# Patient Record
Sex: Female | Born: 1975 | Race: Black or African American | Hispanic: No | Smoking: Former smoker
Health system: Southern US, Community
[De-identification: ages and names within clinical notes are randomized; demographics above are authoritative.]

## PROBLEM LIST (undated history)

## (undated) DIAGNOSIS — D649 Anemia, unspecified: Secondary | ICD-10-CM

## (undated) DIAGNOSIS — M7989 Other specified soft tissue disorders: Secondary | ICD-10-CM

## (undated) DIAGNOSIS — I2699 Other pulmonary embolism without acute cor pulmonale: Secondary | ICD-10-CM

## (undated) DIAGNOSIS — M2550 Pain in unspecified joint: Secondary | ICD-10-CM

## (undated) DIAGNOSIS — I272 Pulmonary hypertension, unspecified: Secondary | ICD-10-CM

## (undated) DIAGNOSIS — M549 Dorsalgia, unspecified: Secondary | ICD-10-CM

## (undated) DIAGNOSIS — I10 Essential (primary) hypertension: Secondary | ICD-10-CM

## (undated) DIAGNOSIS — G4733 Obstructive sleep apnea (adult) (pediatric): Secondary | ICD-10-CM

## (undated) DIAGNOSIS — E559 Vitamin D deficiency, unspecified: Secondary | ICD-10-CM

## (undated) DIAGNOSIS — G4734 Idiopathic sleep related nonobstructive alveolar hypoventilation: Secondary | ICD-10-CM

## (undated) DIAGNOSIS — E119 Type 2 diabetes mellitus without complications: Secondary | ICD-10-CM

## (undated) DIAGNOSIS — I509 Heart failure, unspecified: Secondary | ICD-10-CM

## (undated) HISTORY — DX: Idiopathic sleep related nonobstructive alveolar hypoventilation: G47.34

## (undated) HISTORY — DX: Pulmonary hypertension, unspecified: I27.20

## (undated) HISTORY — DX: Type 2 diabetes mellitus without complications: E11.9

## (undated) HISTORY — DX: Vitamin D deficiency, unspecified: E55.9

## (undated) HISTORY — DX: Other specified soft tissue disorders: M79.89

## (undated) HISTORY — DX: Obstructive sleep apnea (adult) (pediatric): G47.33

## (undated) HISTORY — DX: Dorsalgia, unspecified: M54.9

## (undated) HISTORY — DX: Pain in unspecified joint: M25.50

## (undated) HISTORY — DX: Heart failure, unspecified: I50.9

## (undated) HISTORY — DX: Anemia, unspecified: D64.9

## (undated) HISTORY — DX: Other pulmonary embolism without acute cor pulmonale: I26.99

---

## 2002-07-15 ENCOUNTER — Ambulatory Visit (HOSPITAL_COMMUNITY): Admission: RE | Admit: 2002-07-15 | Discharge: 2002-07-15 | Payer: Self-pay | Admitting: *Deleted

## 2002-09-08 ENCOUNTER — Ambulatory Visit (HOSPITAL_COMMUNITY): Admission: RE | Admit: 2002-09-08 | Discharge: 2002-09-08 | Payer: Self-pay | Admitting: *Deleted

## 2002-11-27 ENCOUNTER — Inpatient Hospital Stay (HOSPITAL_COMMUNITY): Admission: AD | Admit: 2002-11-27 | Discharge: 2002-11-27 | Payer: Self-pay | Admitting: *Deleted

## 2002-12-17 ENCOUNTER — Encounter: Admission: RE | Admit: 2002-12-17 | Discharge: 2002-12-17 | Payer: Self-pay | Admitting: *Deleted

## 2002-12-17 ENCOUNTER — Inpatient Hospital Stay (HOSPITAL_COMMUNITY): Admission: AD | Admit: 2002-12-17 | Discharge: 2002-12-17 | Payer: Self-pay | Admitting: Obstetrics & Gynecology

## 2002-12-23 ENCOUNTER — Inpatient Hospital Stay (HOSPITAL_COMMUNITY): Admission: AD | Admit: 2002-12-23 | Discharge: 2002-12-23 | Payer: Self-pay | Admitting: Obstetrics and Gynecology

## 2002-12-23 ENCOUNTER — Encounter: Payer: Self-pay | Admitting: Obstetrics and Gynecology

## 2002-12-28 ENCOUNTER — Encounter (INDEPENDENT_AMBULATORY_CARE_PROVIDER_SITE_OTHER): Payer: Self-pay

## 2002-12-28 ENCOUNTER — Encounter: Payer: Self-pay | Admitting: Obstetrics & Gynecology

## 2002-12-28 ENCOUNTER — Inpatient Hospital Stay (HOSPITAL_COMMUNITY): Admission: AD | Admit: 2002-12-28 | Discharge: 2002-12-29 | Payer: Self-pay | Admitting: Obstetrics & Gynecology

## 2003-02-15 ENCOUNTER — Inpatient Hospital Stay (HOSPITAL_COMMUNITY): Admission: AD | Admit: 2003-02-15 | Discharge: 2003-02-16 | Payer: Self-pay | Admitting: Obstetrics & Gynecology

## 2003-02-22 ENCOUNTER — Encounter: Admission: RE | Admit: 2003-02-22 | Discharge: 2003-02-22 | Payer: Self-pay | Admitting: *Deleted

## 2003-06-28 ENCOUNTER — Inpatient Hospital Stay (HOSPITAL_COMMUNITY): Admission: AD | Admit: 2003-06-28 | Discharge: 2003-06-28 | Payer: Self-pay | Admitting: Family Medicine

## 2003-07-27 ENCOUNTER — Inpatient Hospital Stay (HOSPITAL_COMMUNITY): Admission: AD | Admit: 2003-07-27 | Discharge: 2003-07-27 | Payer: Self-pay | Admitting: Family Medicine

## 2003-08-26 ENCOUNTER — Encounter: Admission: RE | Admit: 2003-08-26 | Discharge: 2003-08-26 | Payer: Self-pay | Admitting: Family Medicine

## 2003-09-02 ENCOUNTER — Ambulatory Visit (HOSPITAL_COMMUNITY): Admission: RE | Admit: 2003-09-02 | Discharge: 2003-09-02 | Payer: Self-pay | Admitting: Obstetrics and Gynecology

## 2003-09-09 ENCOUNTER — Encounter: Admission: RE | Admit: 2003-09-09 | Discharge: 2003-09-09 | Payer: Self-pay | Admitting: Family Medicine

## 2003-09-30 ENCOUNTER — Encounter: Admission: RE | Admit: 2003-09-30 | Discharge: 2003-09-30 | Payer: Self-pay | Admitting: *Deleted

## 2003-10-14 ENCOUNTER — Encounter: Admission: RE | Admit: 2003-10-14 | Discharge: 2003-10-14 | Payer: Self-pay | Admitting: Family Medicine

## 2003-10-28 ENCOUNTER — Encounter: Admission: RE | Admit: 2003-10-28 | Discharge: 2003-10-28 | Payer: Self-pay | Admitting: Family Medicine

## 2003-10-28 ENCOUNTER — Ambulatory Visit (HOSPITAL_COMMUNITY): Admission: RE | Admit: 2003-10-28 | Discharge: 2003-10-28 | Payer: Self-pay | Admitting: *Deleted

## 2003-11-04 ENCOUNTER — Encounter: Admission: RE | Admit: 2003-11-04 | Discharge: 2003-11-04 | Payer: Self-pay | Admitting: Family Medicine

## 2003-11-18 ENCOUNTER — Encounter: Admission: RE | Admit: 2003-11-18 | Discharge: 2003-11-18 | Payer: Self-pay | Admitting: Family Medicine

## 2003-11-25 ENCOUNTER — Ambulatory Visit (HOSPITAL_COMMUNITY): Admission: RE | Admit: 2003-11-25 | Discharge: 2003-11-25 | Payer: Self-pay | Admitting: Family Medicine

## 2003-12-01 ENCOUNTER — Inpatient Hospital Stay (HOSPITAL_COMMUNITY): Admission: AD | Admit: 2003-12-01 | Discharge: 2003-12-01 | Payer: Self-pay | Admitting: Obstetrics and Gynecology

## 2003-12-02 ENCOUNTER — Ambulatory Visit: Payer: Self-pay | Admitting: Family Medicine

## 2003-12-09 ENCOUNTER — Inpatient Hospital Stay (HOSPITAL_COMMUNITY): Admission: AD | Admit: 2003-12-09 | Discharge: 2003-12-09 | Payer: Self-pay | Admitting: *Deleted

## 2003-12-09 ENCOUNTER — Ambulatory Visit: Payer: Self-pay | Admitting: Family Medicine

## 2003-12-14 ENCOUNTER — Encounter: Admission: RE | Admit: 2003-12-14 | Discharge: 2003-12-14 | Payer: Self-pay | Admitting: *Deleted

## 2003-12-16 ENCOUNTER — Ambulatory Visit: Payer: Self-pay | Admitting: Family Medicine

## 2003-12-20 ENCOUNTER — Ambulatory Visit: Payer: Self-pay | Admitting: Obstetrics & Gynecology

## 2003-12-23 ENCOUNTER — Ambulatory Visit: Payer: Self-pay | Admitting: Family Medicine

## 2003-12-30 ENCOUNTER — Ambulatory Visit: Payer: Self-pay | Admitting: Family Medicine

## 2003-12-30 ENCOUNTER — Ambulatory Visit (HOSPITAL_COMMUNITY): Admission: RE | Admit: 2003-12-30 | Discharge: 2003-12-30 | Payer: Self-pay | Admitting: *Deleted

## 2004-01-06 ENCOUNTER — Ambulatory Visit: Payer: Self-pay | Admitting: Family Medicine

## 2004-01-11 ENCOUNTER — Ambulatory Visit: Payer: Self-pay | Admitting: Obstetrics & Gynecology

## 2004-01-12 ENCOUNTER — Inpatient Hospital Stay (HOSPITAL_COMMUNITY): Admission: AD | Admit: 2004-01-12 | Discharge: 2004-01-16 | Payer: Self-pay | Admitting: Obstetrics & Gynecology

## 2004-01-12 ENCOUNTER — Ambulatory Visit: Payer: Self-pay | Admitting: Family Medicine

## 2004-01-14 ENCOUNTER — Encounter (INDEPENDENT_AMBULATORY_CARE_PROVIDER_SITE_OTHER): Payer: Self-pay | Admitting: *Deleted

## 2006-01-12 ENCOUNTER — Emergency Department (HOSPITAL_COMMUNITY): Admission: EM | Admit: 2006-01-12 | Discharge: 2006-01-12 | Payer: Self-pay | Admitting: Emergency Medicine

## 2007-01-23 ENCOUNTER — Emergency Department (HOSPITAL_COMMUNITY): Admission: EM | Admit: 2007-01-23 | Discharge: 2007-01-24 | Payer: Self-pay | Admitting: Emergency Medicine

## 2007-09-17 ENCOUNTER — Inpatient Hospital Stay (HOSPITAL_COMMUNITY): Admission: AD | Admit: 2007-09-17 | Discharge: 2007-09-17 | Payer: Self-pay | Admitting: Obstetrics & Gynecology

## 2007-10-28 ENCOUNTER — Inpatient Hospital Stay (HOSPITAL_COMMUNITY): Admission: AD | Admit: 2007-10-28 | Discharge: 2007-10-28 | Payer: Self-pay | Admitting: Obstetrics & Gynecology

## 2007-11-19 ENCOUNTER — Encounter: Payer: Self-pay | Admitting: Obstetrics and Gynecology

## 2007-11-19 ENCOUNTER — Ambulatory Visit: Payer: Self-pay | Admitting: Obstetrics & Gynecology

## 2007-12-03 ENCOUNTER — Ambulatory Visit (HOSPITAL_COMMUNITY): Admission: RE | Admit: 2007-12-03 | Discharge: 2007-12-03 | Payer: Self-pay | Admitting: Family Medicine

## 2007-12-11 ENCOUNTER — Ambulatory Visit: Payer: Self-pay | Admitting: Obstetrics & Gynecology

## 2007-12-25 ENCOUNTER — Ambulatory Visit: Payer: Self-pay | Admitting: Obstetrics & Gynecology

## 2007-12-25 ENCOUNTER — Ambulatory Visit (HOSPITAL_COMMUNITY): Admission: RE | Admit: 2007-12-25 | Discharge: 2007-12-25 | Payer: Self-pay | Admitting: Family Medicine

## 2008-01-01 ENCOUNTER — Ambulatory Visit: Payer: Self-pay | Admitting: Obstetrics & Gynecology

## 2008-01-08 ENCOUNTER — Ambulatory Visit (HOSPITAL_COMMUNITY): Admission: RE | Admit: 2008-01-08 | Discharge: 2008-01-08 | Payer: Self-pay | Admitting: Family Medicine

## 2008-01-15 ENCOUNTER — Ambulatory Visit: Payer: Self-pay | Admitting: Family Medicine

## 2008-01-22 ENCOUNTER — Ambulatory Visit (HOSPITAL_COMMUNITY): Admission: RE | Admit: 2008-01-22 | Discharge: 2008-01-22 | Payer: Self-pay | Admitting: Family Medicine

## 2008-01-29 ENCOUNTER — Ambulatory Visit: Payer: Self-pay | Admitting: Obstetrics & Gynecology

## 2008-01-29 ENCOUNTER — Encounter: Payer: Self-pay | Admitting: Family Medicine

## 2008-01-29 LAB — CONVERTED CEMR LAB: Chlamydia, DNA Probe: NEGATIVE

## 2008-02-05 ENCOUNTER — Ambulatory Visit (HOSPITAL_COMMUNITY): Admission: RE | Admit: 2008-02-05 | Discharge: 2008-02-05 | Payer: Self-pay | Admitting: Family Medicine

## 2008-02-12 ENCOUNTER — Encounter: Payer: Self-pay | Admitting: Family Medicine

## 2008-02-12 ENCOUNTER — Ambulatory Visit: Payer: Self-pay | Admitting: Obstetrics & Gynecology

## 2008-02-12 ENCOUNTER — Ambulatory Visit (HOSPITAL_COMMUNITY): Admission: RE | Admit: 2008-02-12 | Discharge: 2008-02-12 | Payer: Self-pay | Admitting: Obstetrics & Gynecology

## 2008-02-16 ENCOUNTER — Ambulatory Visit: Payer: Self-pay | Admitting: Obstetrics & Gynecology

## 2008-02-18 ENCOUNTER — Ambulatory Visit: Payer: Self-pay | Admitting: Obstetrics & Gynecology

## 2008-02-18 ENCOUNTER — Ambulatory Visit (HOSPITAL_COMMUNITY): Admission: RE | Admit: 2008-02-18 | Discharge: 2008-02-18 | Payer: Self-pay | Admitting: Family Medicine

## 2008-02-23 ENCOUNTER — Ambulatory Visit: Payer: Self-pay | Admitting: Obstetrics & Gynecology

## 2008-02-26 ENCOUNTER — Ambulatory Visit: Payer: Self-pay | Admitting: Obstetrics & Gynecology

## 2008-02-26 ENCOUNTER — Ambulatory Visit (HOSPITAL_COMMUNITY): Admission: RE | Admit: 2008-02-26 | Discharge: 2008-02-26 | Payer: Self-pay | Admitting: Family Medicine

## 2008-03-01 ENCOUNTER — Ambulatory Visit: Payer: Self-pay | Admitting: Obstetrics & Gynecology

## 2008-03-04 ENCOUNTER — Ambulatory Visit (HOSPITAL_COMMUNITY): Admission: RE | Admit: 2008-03-04 | Discharge: 2008-03-04 | Payer: Self-pay | Admitting: Family Medicine

## 2008-03-04 ENCOUNTER — Ambulatory Visit: Payer: Self-pay | Admitting: Obstetrics & Gynecology

## 2008-03-08 ENCOUNTER — Ambulatory Visit: Payer: Self-pay | Admitting: Obstetrics & Gynecology

## 2008-03-11 ENCOUNTER — Ambulatory Visit (HOSPITAL_COMMUNITY): Admission: RE | Admit: 2008-03-11 | Discharge: 2008-03-11 | Payer: Self-pay | Admitting: Family Medicine

## 2008-03-11 ENCOUNTER — Encounter: Payer: Self-pay | Admitting: Family Medicine

## 2008-03-11 ENCOUNTER — Ambulatory Visit: Payer: Self-pay | Admitting: Obstetrics & Gynecology

## 2008-03-12 ENCOUNTER — Encounter: Payer: Self-pay | Admitting: Family Medicine

## 2008-03-15 ENCOUNTER — Ambulatory Visit: Payer: Self-pay | Admitting: Family Medicine

## 2008-03-17 ENCOUNTER — Ambulatory Visit (HOSPITAL_COMMUNITY): Admission: RE | Admit: 2008-03-17 | Discharge: 2008-03-17 | Payer: Self-pay | Admitting: Family Medicine

## 2008-03-22 ENCOUNTER — Ambulatory Visit (HOSPITAL_COMMUNITY): Admission: RE | Admit: 2008-03-22 | Discharge: 2008-03-22 | Payer: Self-pay | Admitting: Family Medicine

## 2008-03-25 ENCOUNTER — Ambulatory Visit: Payer: Self-pay | Admitting: Family Medicine

## 2008-03-25 ENCOUNTER — Ambulatory Visit (HOSPITAL_COMMUNITY): Admission: RE | Admit: 2008-03-25 | Discharge: 2008-03-25 | Payer: Self-pay | Admitting: Family Medicine

## 2008-03-29 ENCOUNTER — Ambulatory Visit: Payer: Self-pay | Admitting: Obstetrics & Gynecology

## 2008-04-02 ENCOUNTER — Inpatient Hospital Stay (HOSPITAL_COMMUNITY): Admission: RE | Admit: 2008-04-02 | Discharge: 2008-04-06 | Payer: Self-pay | Admitting: Family Medicine

## 2008-04-02 ENCOUNTER — Ambulatory Visit: Payer: Self-pay | Admitting: Obstetrics & Gynecology

## 2008-04-03 ENCOUNTER — Encounter: Payer: Self-pay | Admitting: Obstetrics & Gynecology

## 2009-02-27 ENCOUNTER — Emergency Department (HOSPITAL_COMMUNITY): Admission: EM | Admit: 2009-02-27 | Discharge: 2009-02-27 | Payer: Self-pay | Admitting: Advanced Practice Midwife

## 2009-09-13 ENCOUNTER — Ambulatory Visit: Payer: Self-pay | Admitting: Vascular Surgery

## 2009-09-13 ENCOUNTER — Encounter (INDEPENDENT_AMBULATORY_CARE_PROVIDER_SITE_OTHER): Payer: Self-pay | Admitting: Emergency Medicine

## 2009-09-13 ENCOUNTER — Emergency Department (HOSPITAL_COMMUNITY): Admission: EM | Admit: 2009-09-13 | Discharge: 2009-09-13 | Payer: Self-pay | Admitting: Emergency Medicine

## 2010-04-16 ENCOUNTER — Encounter: Payer: Self-pay | Admitting: *Deleted

## 2010-07-10 LAB — CBC
HCT: 29.5 % — ABNORMAL LOW (ref 36.0–46.0)
HCT: 33 % — ABNORMAL LOW (ref 36.0–46.0)
MCHC: 32.4 g/dL (ref 30.0–36.0)
MCHC: 32.5 g/dL (ref 30.0–36.0)
MCV: 80 fL (ref 78.0–100.0)
MCV: 80.3 fL (ref 78.0–100.0)
Platelets: 273 10*3/uL (ref 150–400)
Platelets: 323 10*3/uL (ref 150–400)
RDW: 16.4 % — ABNORMAL HIGH (ref 11.5–15.5)

## 2010-08-08 NOTE — Discharge Summary (Signed)
Alexa [name], Alexa [name]              ACCOUNT NO.:  [id number]   MEDICAL RECORD NO.:  [id number]          PATIENT TYPE:  INP   LOCATION:  9144                          FACILITY:  [facility]   PHYSICIAN:  Norton Blizzard, MD    DATE OF BIRTH:  [birth year]   DATE OF ADMISSION:  04/02/2008  DATE OF DISCHARGE:  04/06/2008                               DISCHARGE SUMMARY   REASON FOR ADMISSION:  Pregnancy at 39 weeks and 1-day for elective  induction of labor due to a previous history of IUFD at 42 weeks.   DISCHARGE DIAGNOSES:  Pregnancy delivered via primary low-transverse  cesarean section for fetal intolerance to labor.   POSTOPERATIVE DIAGNOSES:  Pregnancy delivered via primary low-transverse  cesarean section for fetal intolerance to labor.   SURGEON:  Norton Blizzard, MD   ASSISTANT:  Cam Hai, C.N.M.   BRIEF HOSPITAL COURSE:  On April 03, 2008, Alexa [name] had an normal viable  female infant, Apgars of 8 and 9 via cesarean section.  Estimated blood  loss at that time was approximately 800 mL.  Postop period has  progressed well.  The patient denies any problems.  She is ambulating  well, taking fluids and solids well.  She has already had a bowel  movement.  Her JP drain was removed this morning. Discharge hemoglobin  was 9.6, compared to admission value of 10.6.   PHYSICAL EXAMINATION:  VITAL SIGNS:  This morning was stable.  HEART:  Regular to rhythm and rate.  LUNGS:  Clear to auscultation bilaterally.  ABDOMEN:  Soft.  Bowel sounds present in all 4 quadrants.  Incision is  intact.  There is no redness, swelling or drainage.  EXTREMITIES: 1+ pitting edema in lower extremities.   ASSESSMENT:  Stable postop day 3.  Anemia due to surgical procedure.   PLAN:  We are going to discharge her home.  Her [facility] nurse is going  to come out and remove her staples on postop day 7.   DISCHARGE MEDICATIONS:  1. Prevagen Forte 1 p.o. b.i.d.  2. Percocet 5/325 one p.o. q.4 h. p.r.n.  pain.  3. Motrin 600 one p.o. q.6 h. p.r.n. cramping.      Zerita Boers, N.M.      Norton Blizzard, MD  Electronically Signed    DL/MEDQ  D:  19/14/7829  T:  04/07/2008  Job:  [id number]   cc:   Norton Blizzard, MD   [facility] Department

## 2010-08-08 NOTE — Op Note (Signed)
Alexa [name], Alexa [name]              ACCOUNT NO.:  [id number]   MEDICAL RECORD NO.:  [id number]          PATIENT TYPE:  INP   LOCATION:                                [facility]  [facility]   PHYSICIAN:  Norton Blizzard, MD    DATE OF BIRTH:  07-02-1975   DATE OF PROCEDURE:  04/03/2008  DATE OF DISCHARGE:  04/06/2008                               OPERATIVE REPORT   PREOPERATIVE DIAGNOSES:  1. Intrauterine pregnancy at 29 and 2/7 weeks' gestation.  2. Failed induction of labor for a history of intrauterine fetal      demise at 42 weeks.  3. Morbid obesity.  4. Fetal intolerance of labor/non-reassuring fetal heart rate tracing.  5. Failure of cervical dilatation.   POSTOPERATIVE DIAGNOSES:  1. Intrauterine pregnancy at 45 and 2/7 weeks' gestation.  2. Failed induction of labor for a history of intrauterine fetal      demise at 42 weeks.  3. Morbid obesity.  4. Fetal intolerance of labor/non-reassuring fetal heart rate tracing.  5. Failure of cervical dilatation.   PROCEDURE:  Primary low transverse cesarean section via Pfannenstiel  incision.   SURGEON:  Norton Blizzard, MD   ASSISTANT:  Cam Hai, Certified Nurse Midwife.   ANESTHESIA:  Epidural.   IV FLUIDS:  2 liters of lactated Ringer's.   ESTIMATED BLOOD LOSS:  800 mL.   URINE OUTPUT:  125 mL.   INDICATIONS:  The patient is a 35 year old, gravida 3, para 2-0-0-1, who  presented at 39-2/7 weeks for induction of labor given her history of  prior IUFD at 42 weeks.  The patient started her induction of labor on  the early morning of April 02, 2008.  Her induction proceeded with  Cytotec, Foley catheter placement, and Pitocin.  On the night of April 02, 2008, the patient was noted to go to a 4-5 cm cervical dilatation and  could not demonstrate any further cervical change from there.  An  intrauterine pressure catheter was placed which documented inadequate  contractions and so Pitocin was increased as per protocol to  get  adequate strength of contractions.  It was noted that when the Pitocin  was put up to 6 units per minute that the fetal heart rate tracing  demonstrated significant decelerations to the 80s-100s for several  minutes which were repetitive in nature.  These decelerations were  resolved by turning off the Pitocin, putting oxygen via face mass on the  patient, and turning to either on the right lateral decubitus or the  left lateral decubitus position.  An amnioinfusion was also started to  help in resolving the decelerations.  However, the decelerations were  noted to persist and there was no further change from 5-cm cervical  dilatation even after several hours and adequate contractions.  The  patient was counseled regarding cesarean section.  The risks of cesarean  section including but not limited to bleeding, infection, injury to  surrounding organs, and need for additional procedures including  hysterectomy in the event of life-threatening bleed were explained to  the patient and written informed consent was  obtained.   FINDINGS:  Viable female infant in the left occiput posterior position.  Apgars were 8 at 9, weight 5 pounds and 14 ounces.  There was  spontaneous placental delivery, intact with three-vessel cord.  Normal  uterus, tubes, and ovaries bilaterally.   SPECIMENS:  Placenta.   DISPOSITION:  Specimens to pathology.   COMPLICATIONS:  None immediately after the case.   PROCEDURE IN DETAILS:  The patient received Ancef 2 g IV prior to the  procedure.  Her epidural was also dosed up to a surgical level and the  patient was taken to the operating room where she was placed in the  dorsal supine position with a leftward tilt and prepped and draped in a  sterile manner.  Given the patient's obesity, her pannus was taped up to  give access to the lower part of her abdomen, and allow for making a  Pfannenstiel incision.  A Pfannenstiel incision was then made with  scalpel  and carried through to the underlying layer of fascia.  Of note,  the patient did have significant subcutaneous tissue and a few bleeders  in the subcutaneous tissue that were controlled using coagulation.  The  fascia was incised in the midline and this incision was extended  bilaterally using Mayo scissors.  The superior aspect of the fascia was  grasped with Kochers and the underlying rectus muscles were dissected  off bluntly and sharply.  A similar process was carried out on the  inferior aspect of the incision.  The rectus muscles were separated in  the midline bluntly and the peritoneum was entered bluntly.  This  peritoneal incision was extended superiorly and inferiorly with good  visualization of the bowel and bladder.  Attention was then turned to  the lower uterine segment where a bladder flap was created using  Metzenbaum scissors and a transverse hysterotomy was made using the  scalpel.  The hysterotomy was extended bilaterally in a blunt fashion.  The infant was encountered and delivered.  Of note, the infant was noted  to have one loose nuchal cord and one loose body cord.  Infant was  delivered atraumatically.  The nose and mouth of the infant were  suctioned and the cord was clamped and cut.  The infant was then handed  over to the awaiting neonatologists.  The placenta was noted to deliver  spontaneously and was intact with three-vessel cord.  The uterus was  then cleared of all clot and debris.  The hysterotomy was repaired using  0 Monocryl in 2 layers, the first layer was a running interlocking  stitch and the second layer was an imbricating layer.  Overall, good  hemostasis was noted.  The pelvis was cleared of all clot and debris.  The muscles were loosely reapproximated using 2 interrupted stitches of  0 Monocryl, and the fascia was reapproximated using 0 PDS.  The  subcutaneous layer was copiously irrigated and at this point given the  depth of her subcutaneous  tissue, the decision was made to put in a JP  drain.  The drain was placed in the subcutaneous layer and brought out  through a small incision on the patient's left side.  After the  placement of this drain, the subcutaneous layers were reapproximated  using 2-0 plain gut.  The JP drain was then secured using 2-0 silk  stitch and the skin incision was closed with staples.  The patient  tolerated the procedure well.  Sponge, instrument, and needle counts  were correct x2.  She was taken to the recovery room in stable  condition. The plan is for the patient's drain to stay in place until  minimal drainage is noted, and for the incisional staples to stay in  place for at least 7 days postop.     Norton Blizzard, MD  Electronically Signed    UAD/MEDQ  D:  04/03/2008  T:  04/04/2008  Job:  [id number]

## 2010-08-11 NOTE — Discharge Summary (Signed)
   NAMEJOLANA, Alexa [name]                        ACCOUNT NO.:  [id number]   MEDICAL RECORD NO.:  [id number]                   PATIENT TYPE:  INP   LOCATION:  9323                                 [facility]ACILITY:  [facility]   PHYSICIAN:  Franchot Mimes, MD                   DATE OF BIRTH:  03/28/1975   DATE OF ADMISSION:  12/28/2002  DATE OF DISCHARGE:  12/29/2002                                 DISCHARGE SUMMARY   DISCHARGE DIAGNOSIS:  Delivery of intrauterine fetal demise.   HOSPITAL COURSE:  [name] is a 35 year old G1 who presents at 41 weeks  and 3 days as estimated gestational age, dated by an ultrasound at 17 weeks  who was feeling contractions earlier in the morning.  The patient presented  to the maternal admissions unit.  An ultrasound was performed that showed no  beating heart or no heart tones.  The patient had a previous ultrasound, on  12/23/2002, that showed a normal AFI with fetal movement and breathing.  At  this time the patient was admitted to labor and delivery and started on  Pitocin low dose protocol.  The fetus was delivered on 12/28/2002 at  approximately 5 p.m. without complication.  Intact placenta delivered  spontaneously with three-vessel cord.  There were no lacerations.  Estimated  blood loss 250 cc.  Placental cultures were sent and the placenta was sent  to pathology.  Following delivery of the fetus, the patient had no  complications in the postpartum period.  She had a hemoglobin of 10.8.  She  did have a temperature to 99.8.  A recheck of the temperature showed that it  was 98.5 at approximately 2 on the day of discharge.  The patient was  discharged home with counseling regarding further pregnancies as well as  when to return if she began to develop a fever.   DISCHARGE DESTINATION:  The patient was discharged home.   DISCHARGE MEDICATIONS:  Ibuprofen 600 mg p.o. q.6-8h.   FOLLOWUP APPOINTMENTS:  The patient is to follow up with [facility] in  approximately six weeks for a postpartum check.                                               Franchot Mimes, MD    TV/MEDQ  D:  12/29/2002  T:  12/29/2002  Job:  [id number]

## 2010-12-21 LAB — STREP A DNA PROBE

## 2010-12-21 LAB — URINALYSIS, ROUTINE W REFLEX MICROSCOPIC
Bilirubin Urine: NEGATIVE
Ketones, ur: NEGATIVE
Nitrite: NEGATIVE
Protein, ur: NEGATIVE

## 2010-12-21 LAB — CBC
HCT: 34.8 — ABNORMAL LOW
MCHC: 33.5
MCV: 81.4
RBC: 4.27

## 2010-12-22 LAB — URINALYSIS, ROUTINE W REFLEX MICROSCOPIC
Glucose, UA: NEGATIVE
Leukocytes, UA: NEGATIVE
Protein, ur: NEGATIVE
Specific Gravity, Urine: >1.03 — ABNORMAL HIGH
pH: 6

## 2010-12-22 LAB — CBC
HCT: 34.5 — ABNORMAL LOW
Hemoglobin: 11.3 — ABNORMAL LOW
MCHC: 32.9
MCV: 80.6
Platelets: 311
RDW: 14.4

## 2010-12-22 LAB — URINE MICROSCOPIC-ADD ON

## 2010-12-22 LAB — WET PREP, GENITAL
Trich, Wet Prep: NONE SEEN
Yeast Wet Prep HPF POC: NONE SEEN

## 2010-12-25 LAB — POCT URINALYSIS DIP (DEVICE)
Bilirubin Urine: NEGATIVE
Bilirubin Urine: NEGATIVE
Glucose, UA: NEGATIVE
Glucose, UA: NEGATIVE
Glucose, UA: NEGATIVE
Hgb urine dipstick: NEGATIVE
Hgb urine dipstick: NEGATIVE
Hgb urine dipstick: NEGATIVE
Hgb urine dipstick: NEGATIVE
Nitrite: NEGATIVE
Nitrite: NEGATIVE
Nitrite: NEGATIVE
Specific Gravity, Urine: 1.015
Specific Gravity, Urine: 1.015
Specific Gravity, Urine: 1.015
Specific Gravity, Urine: 1.015
Urobilinogen, UA: 0.2
Urobilinogen, UA: 0.2
Urobilinogen, UA: 0.2
pH: 8.5 — ABNORMAL HIGH
pH: 7.5
pH: 8
pH: 8.5 — ABNORMAL HIGH

## 2010-12-26 LAB — POCT URINALYSIS DIP (DEVICE)
Bilirubin Urine: NEGATIVE
Bilirubin Urine: NEGATIVE
Glucose, UA: NEGATIVE
Hgb urine dipstick: NEGATIVE
Ketones, ur: NEGATIVE
Protein, ur: 30 — AB
Specific Gravity, Urine: 1.015
Specific Gravity, Urine: 1.02
pH: 7
pH: 7.5

## 2010-12-28 LAB — POCT URINALYSIS DIP (DEVICE)
Bilirubin Urine: NEGATIVE
Hgb urine dipstick: NEGATIVE
Hgb urine dipstick: NEGATIVE
Hgb urine dipstick: NEGATIVE
Ketones, ur: NEGATIVE mg/dL
Ketones, ur: NEGATIVE mg/dL
Nitrite: NEGATIVE
Protein, ur: 30 mg/dL — AB
Protein, ur: 30 mg/dL — AB
Protein, ur: 30 mg/dL — AB
Protein, ur: NEGATIVE mg/dL
Specific Gravity, Urine: 1.015 (ref 1.005–1.030)
Specific Gravity, Urine: 1.015 (ref 1.005–1.030)
Specific Gravity, Urine: 1.02 (ref 1.005–1.030)
Specific Gravity, Urine: 1.025 (ref 1.005–1.030)
Urobilinogen, UA: 1 mg/dL (ref 0.0–1.0)
Urobilinogen, UA: 2 mg/dL — ABNORMAL HIGH (ref 0.0–1.0)
pH: 6.5 (ref 5.0–8.0)
pH: 6.5 (ref 5.0–8.0)
pH: 7 (ref 5.0–8.0)
pH: 7 (ref 5.0–8.0)

## 2011-01-03 LAB — GC/CHLAMYDIA PROBE AMP, GENITAL: GC Probe Amp, Genital: NEGATIVE

## 2011-01-03 LAB — URINALYSIS, ROUTINE W REFLEX MICROSCOPIC
Leukocytes, UA: NEGATIVE
Nitrite: NEGATIVE
Protein, ur: NEGATIVE
Specific Gravity, Urine: 1.025
Urobilinogen, UA: 1

## 2011-01-03 LAB — URINE MICROSCOPIC-ADD ON

## 2011-01-03 LAB — HCG, QUANTITATIVE, PREGNANCY: hCG, Beta Chain, Quant, S: 79 — ABNORMAL HIGH

## 2011-05-14 ENCOUNTER — Emergency Department (HOSPITAL_COMMUNITY): Payer: Self-pay

## 2011-05-14 ENCOUNTER — Emergency Department (HOSPITAL_COMMUNITY)
Admission: EM | Admit: 2011-05-14 | Discharge: 2011-05-14 | Disposition: A | Discharge status: Home / Self Care | Payer: Self-pay | Attending: Emergency Medicine | Admitting: Emergency Medicine

## 2011-05-14 ENCOUNTER — Encounter (HOSPITAL_COMMUNITY): Payer: Self-pay | Admitting: Emergency Medicine

## 2011-05-14 DIAGNOSIS — D649 Anemia, unspecified: Secondary | ICD-10-CM | POA: Insufficient documentation

## 2011-05-14 DIAGNOSIS — R358 Other polyuria: Secondary | ICD-10-CM | POA: Insufficient documentation

## 2011-05-14 DIAGNOSIS — R631 Polydipsia: Secondary | ICD-10-CM | POA: Insufficient documentation

## 2011-05-14 DIAGNOSIS — R3589 Other polyuria: Secondary | ICD-10-CM | POA: Insufficient documentation

## 2011-05-14 DIAGNOSIS — R51 Headache: Secondary | ICD-10-CM | POA: Insufficient documentation

## 2011-05-14 LAB — CBC
HCT: 30.1 % — ABNORMAL LOW (ref 36.0–46.0)
MCHC: 30.6 g/dL (ref 30.0–36.0)
MCV: 68.1 fL — ABNORMAL LOW (ref 78.0–100.0)
Platelets: 372 10*3/uL (ref 150–400)
RDW: 18.8 % — ABNORMAL HIGH (ref 11.5–15.5)
WBC: 8.7 10*3/uL (ref 4.0–10.5)

## 2011-05-14 LAB — DIFFERENTIAL
Basophils Absolute: 0 10*3/uL (ref 0.0–0.1)
Eosinophils Absolute: 0 10*3/uL (ref 0.0–0.7)
Lymphocytes Relative: 28 % (ref 12–46)
Lymphs Abs: 2.4 10*3/uL (ref 0.7–4.0)
Monocytes Relative: 12 % (ref 3–12)
Neutro Abs: 5.3 10*3/uL (ref 1.7–7.7)

## 2011-05-14 LAB — URINALYSIS, MICROSCOPIC ONLY
Glucose, UA: NEGATIVE mg/dL
Ketones, ur: NEGATIVE mg/dL
Nitrite: NEGATIVE
pH: 6 (ref 5.0–8.0)

## 2011-05-14 LAB — COMPREHENSIVE METABOLIC PANEL
AST: 18 U/L (ref 0–37)
Albumin: 3.4 g/dL — ABNORMAL LOW (ref 3.5–5.2)
BUN: 13 mg/dL (ref 6–23)
Creatinine, Ser: 0.7 mg/dL (ref 0.50–1.10)
Total Protein: 7.5 g/dL (ref 6.0–8.3)

## 2011-05-14 LAB — GLUCOSE, CAPILLARY

## 2011-05-14 LAB — POCT PREGNANCY, URINE: Preg Test, Ur: NEGATIVE

## 2011-05-14 MED ORDER — SODIUM CHLORIDE 0.9 % IV SOLN: Freq: Once | INTRAVENOUS | Status: DC

## 2011-05-14 MED ORDER — SODIUM CHLORIDE 0.9 % IV BOLUS (SEPSIS)
1000.0000 mL | Freq: Once | INTRAVENOUS | Status: AC
  Administered 2011-05-14: 1000 mL via INTRAVENOUS

## 2011-05-14 MED ORDER — FERROUS GLUCONATE IRON 246 (28 FE) MG PO TABS: 246.0000 mg | ORAL_TABLET | Freq: Every day | ORAL | Status: DC

## 2011-05-14 NOTE — Discharge Instructions (Signed)

## 2011-05-14 NOTE — ED Provider Notes (Signed)
History     CSN: [id number]  Arrival date & time 05/14/11  1355   First MD Initiated Contact with Patient 05/14/11 1511      Chief Complaint  Patient presents with  . Polyuria  . Polydipsia  . Headache  . Near Syncope    (Consider location/radiation/quality/duration/timing/severity/associated sxs/prior treatment) Patient is a 36 y.o. female presenting with headaches. The history is provided by the patient.  Headache    Presents with polyuria and polydipsia x2 months. Recent weight loss. History of gestational diabetes 5 years ago. States that nothing makes her symptoms better or worse. Has had some blurred vision headache. Nothing taken for this. Denies any dysuria or hematuria. No treatment done prior to arrival Past Medical History  Diagnosis Date  . Gestational diabetes     History reviewed. No pertinent past surgical history.  History reviewed. No pertinent family history.  History  Substance Use Topics  . Smoking status: Current Everyday Smoker  . Smokeless tobacco: Not on file  . Alcohol Use: Yes    OB History    Grav Para Term Preterm Abortions TAB SAB Ect Mult Living                  Review of Systems  Neurological: Positive for headaches.  All other systems reviewed and are negative.    Allergies  Penicillins  Home Medications   Current Outpatient Rx  Name Route Sig Dispense Refill  . IBUPROFEN 200 MG PO TABS Oral Take 600 mg by mouth every 6 (six) hours as needed. pain      BP 150/67  Pulse 86  Temp(Src) 98.5 F (36.9 C) (Oral)  Resp 20  SpO2 99%  Physical Exam  Nursing note and vitals reviewed. Constitutional: She is oriented to person, place, and time. She appears well-developed and well-nourished.  Non-toxic appearance. No distress.  HENT:  Head: Normocephalic and atraumatic.  Eyes: Conjunctivae, EOM and lids are normal. Pupils are equal, round, and reactive to light.  Neck: Normal range of motion. Neck supple. No tracheal  deviation present. No mass present.  Cardiovascular: Normal rate, regular rhythm and normal heart sounds.  Exam reveals no gallop.   No murmur heard. Pulmonary/Chest: Effort normal and breath sounds normal. No stridor. No respiratory distress. She has no decreased breath sounds. She has no wheezes. She has no rhonchi. She has no rales.  Abdominal: Soft. Normal appearance and bowel sounds are normal. She exhibits no distension. There is no tenderness. There is no rebound and no CVA tenderness.  Musculoskeletal: Normal range of motion. She exhibits no edema and no tenderness.  Neurological: She is alert and oriented to person, place, and time. She has normal strength. No cranial nerve deficit or sensory deficit. GCS eye subscore is 4. GCS verbal subscore is 5. GCS motor subscore is 6.  Skin: Skin is warm and dry. No abrasion and no rash noted.  Psychiatric: She has a normal mood and affect. Her speech is normal and behavior is normal.    ED Course  Procedures (including critical care time)   Labs Reviewed  GLUCOSE, CAPILLARY  POCT PREGNANCY, URINE  URINALYSIS, WITH MICROSCOPIC  CBC  DIFFERENTIAL  COMPREHENSIVE METABOLIC PANEL  URINALYSIS, ROUTINE W REFLEX MICROSCOPIC   No results found.   No diagnosis found.    MDM  Patient given IV fluids. Patient also planned having a headache and had a head CT which is negative. She does have a mild anemia will place patient had tablets for  this. She'll be given referral to the [facility]        Toy Baker, MD 05/14/11 1807

## 2011-05-14 NOTE — ED Notes (Signed)
Pt states she has having HA, polyuria and polydipsia everyday since October.  She states that she also has been having episodes of dizziness when she stands up, the shakes 2x and blurred vision on and off.

## 2012-04-25 ENCOUNTER — Emergency Department (HOSPITAL_COMMUNITY)
Admission: EM | Admit: 2012-04-25 | Discharge: 2012-04-25 | Disposition: A | Discharge status: Home / Self Care | Payer: Self-pay | Attending: Emergency Medicine | Admitting: Emergency Medicine

## 2012-04-25 ENCOUNTER — Emergency Department (HOSPITAL_COMMUNITY): Payer: Self-pay

## 2012-04-25 ENCOUNTER — Encounter (HOSPITAL_COMMUNITY): Payer: Self-pay | Admitting: Emergency Medicine

## 2012-04-25 DIAGNOSIS — R609 Edema, unspecified: Secondary | ICD-10-CM | POA: Insufficient documentation

## 2012-04-25 DIAGNOSIS — R000 Tachycardia, unspecified: Secondary | ICD-10-CM | POA: Insufficient documentation

## 2012-04-25 DIAGNOSIS — Z79899 Other long term (current) drug therapy: Secondary | ICD-10-CM | POA: Insufficient documentation

## 2012-04-25 DIAGNOSIS — Z8632 Personal history of gestational diabetes: Secondary | ICD-10-CM | POA: Insufficient documentation

## 2012-04-25 DIAGNOSIS — R350 Frequency of micturition: Secondary | ICD-10-CM | POA: Insufficient documentation

## 2012-04-25 DIAGNOSIS — F17200 Nicotine dependence, unspecified, uncomplicated: Secondary | ICD-10-CM | POA: Insufficient documentation

## 2012-04-25 DIAGNOSIS — R059 Cough, unspecified: Secondary | ICD-10-CM | POA: Insufficient documentation

## 2012-04-25 DIAGNOSIS — R0602 Shortness of breath: Secondary | ICD-10-CM | POA: Insufficient documentation

## 2012-04-25 DIAGNOSIS — Z3202 Encounter for pregnancy test, result negative: Secondary | ICD-10-CM | POA: Insufficient documentation

## 2012-04-25 DIAGNOSIS — R05 Cough: Secondary | ICD-10-CM | POA: Insufficient documentation

## 2012-04-25 LAB — URINALYSIS, ROUTINE W REFLEX MICROSCOPIC
Glucose, UA: NEGATIVE mg/dL
Specific Gravity, Urine: 1.015 (ref 1.005–1.030)
pH: 6 (ref 5.0–8.0)

## 2012-04-25 LAB — COMPREHENSIVE METABOLIC PANEL
ALT: 26 U/L (ref 0–35)
Albumin: 3.2 g/dL — ABNORMAL LOW (ref 3.5–5.2)
Alkaline Phosphatase: 58 U/L (ref 39–117)
Chloride: 100 mEq/L (ref 96–112)
GFR calc Af Amer: >90 mL/min (ref >90)
Glucose, Bld: 88 mg/dL (ref 70–99)
Potassium: 3.9 mEq/L (ref 3.5–5.1)
Sodium: 135 mEq/L (ref 135–145)
Total Bilirubin: 0.2 mg/dL — ABNORMAL LOW (ref 0.3–1.2)
Total Protein: 7.4 g/dL (ref 6.0–8.3)

## 2012-04-25 LAB — GLUCOSE, CAPILLARY: Glucose-Capillary: 87 mg/dL (ref 70–99)

## 2012-04-25 LAB — CBC WITH DIFFERENTIAL/PLATELET
Basophils Relative: 0 % (ref 0–1)
Eosinophils Absolute: 0 10*3/uL (ref 0.0–0.7)
Eosinophils Relative: 0 % (ref 0–5)
Hemoglobin: 9.1 g/dL — ABNORMAL LOW (ref 12.0–15.0)
Lymphocytes Relative: 25 % (ref 12–46)
MCHC: 29.7 g/dL — ABNORMAL LOW (ref 30.0–36.0)
Monocytes Relative: 10 % (ref 3–12)
Neutrophils Relative %: 65 % (ref 43–77)
RBC: 4.46 MIL/uL (ref 3.87–5.11)
WBC: 9 10*3/uL (ref 4.0–10.5)

## 2012-04-25 LAB — URINE MICROSCOPIC-ADD ON

## 2012-04-25 MED ORDER — FUROSEMIDE 20 MG PO TABS: 20.0000 mg | ORAL_TABLET | Freq: Every day | ORAL | Status: DC

## 2012-04-25 MED ORDER — FUROSEMIDE 20 MG PO TABS
20.0000 mg | ORAL_TABLET | Freq: Once | ORAL | Status: AC
  Administered 2012-04-25: 20 mg via ORAL
  Filled 2012-04-25: qty 1

## 2012-04-25 NOTE — ED Notes (Signed)
Patient is alert and oriented x3.  She was given DC instructions and follow up visit instructions.  Patient gave verbal understanding. She was DC ambulatory under her own power to home.  V/S stable.  SHe was not showing any signs of distress on DC 

## 2012-04-25 NOTE — ED Notes (Addendum)
Pt presenting to ed with c/o swelling to lower extremities x 1 month. Pt states swelling is getting worse and she feels like her legs are about to bust. Pt denies chest pain at this time pt states her legs are painful to touch. Pt also with c/o of frequent urination only at night

## 2012-04-25 NOTE — ED Provider Notes (Signed)
Medical screening examination/treatment/procedure(s) were performed by non-physician practitioner and as supervising physician I was immediately available for consultation/collaboration.  Jacilyn Sanpedro R. Elon Eoff, MD 04/25/12 2343 

## 2012-04-25 NOTE — ED Provider Notes (Signed)
History     CSN: [id number]  Arrival date & time 04/25/12  1831   First MD Initiated Contact with Patient 04/25/12 2103      Chief Complaint  Patient presents with  . swelling to legs    (Consider location/radiation/quality/duration/timing/severity/associated sxs/prior treatment) HPI Comments: Morbidly obese female states her legs have been swollen for the past month and is getting worse.  Has occasional nonproductive cough, no Hx HTN, no PCP.  Reports that she has had to use the bathroom at night several time nightly which is unusual for her, no reported dysuria, fever, back pain.  The history is provided by the patient.    Past Medical History  Diagnosis Date  . Gestational diabetes     History reviewed. No pertinent past surgical history.  No family history on file.  History  Substance Use Topics  . Smoking status: Current Every Day Smoker    Types: Cigarettes  . Smokeless tobacco: Not on file  . Alcohol Use: Yes     Comment: every other day    OB History    Grav Para Term Preterm Abortions TAB SAB Ect Mult Living                  Review of Systems  Constitutional: Negative for fever and chills.  HENT: Negative for rhinorrhea.   Respiratory: Positive for shortness of breath. Negative for cough and wheezing.   Cardiovascular: Positive for leg swelling. Negative for chest pain and palpitations.  Gastrointestinal: Negative.  Negative for nausea and abdominal pain.  Genitourinary: Negative for dysuria, flank pain, decreased urine volume and vaginal discharge.  Musculoskeletal: Negative for back pain.  Skin: Negative for wound.  Neurological: Negative for dizziness and weakness.    Allergies  Penicillins  Home Medications   Current Outpatient Rx  Name  Route  Sig  Dispense  Refill  . FERROUS GLUCONATE IRON 246 (28 FE) MG PO TABS   Oral   Take 1 tablet (246 mg total) by mouth daily with breakfast.   60 tablet   2   . IBUPROFEN 200 MG PO TABS   Oral   Take 600 mg by mouth every 6 (six) hours as needed. pain         . ADULT MULTIVITAMIN LIQUID CH   Oral   Take 5 mLs by mouth daily.         . FUROSEMIDE 20 MG PO TABS   Oral   Take 1 tablet (20 mg total) by mouth daily.   5 tablet   0     BP 157/79  Pulse 103  Temp 99 F (37.2 C) (Oral)  Resp 21  SpO2 98%  LMP 04/21/2012  Physical Exam  Constitutional: She appears well-developed and well-nourished.       Morbidly obese  HENT:  Head: Normocephalic.  Eyes: Pupils are equal, round, and reactive to light.  Neck: Normal range of motion.  Cardiovascular: Regular rhythm.  Tachycardia present.   Pulmonary/Chest: Effort normal. She has no wheezes. She exhibits no tenderness.  Musculoskeletal: She exhibits edema. She exhibits no tenderness.       2+ pitting edema to knees bilaterally  Neurological: She is alert.  Skin: Skin is warm.    ED Course  Procedures (including critical care time)  Labs Reviewed  URINALYSIS, ROUTINE W REFLEX MICROSCOPIC - Abnormal; Notable for the following:    APPearance CLOUDY (*)     Hgb urine dipstick LARGE (*)     Leukocytes,  UA TRACE (*)     All other components within normal limits  CBC WITH DIFFERENTIAL - Abnormal; Notable for the following:    Hemoglobin 9.1 (*)     HCT 30.6 (*)     MCV 68.6 (*)     MCH 20.4 (*)     MCHC 29.7 (*)     RDW 19.3 (*)     Platelets 435 (*)     All other components within normal limits  COMPREHENSIVE METABOLIC PANEL - Abnormal; Notable for the following:    Albumin 3.2 (*)     Total Bilirubin 0.2 (*)     All other components within normal limits  URINE MICROSCOPIC-ADD ON - Abnormal; Notable for the following:    Squamous Epithelial / LPF FEW (*)     Bacteria, UA FEW (*)     All other components within normal limits  GLUCOSE, CAPILLARY  PRO B NATRIURETIC PEPTIDE  POCT PREGNANCY, URINE   Dg Chest 2 View  04/25/2012  *RADIOLOGY REPORT*  Clinical Data: Bilateral leg swelling.  Shortness of breath.   CHEST - 2 VIEW  Comparison: None.  Findings: Shallow inspiration. The heart size and pulmonary vascularity are normal. The lungs appear clear and expanded without focal air space disease or consolidation. No blunting of the costophrenic angles.  No pneumothorax.  Mediastinal contours appear intact.  Mild degenerative changes in the thoracic spine.  IMPRESSION: No evidence of active pulmonary disease.   Original Report Authenticated By: Burman Nieves, M.D.    ED ECG REPORT   Date: 04/25/2012  EKG Time: 10:41 PM  Rate: 93  Rhythm: normal sinus rhythm,  there are no previous tracings available for comparison  Axis: normal  Intervals:none  ST&T Change: none  Narrative Interpretation:noraml            1. Peripheral edema       MDM  Reviewed labs, chest xray, EKG  All within normal limits will dc patient home with Rx for Lasix 20 mg daily for 5 days and encourage establishing with a local PCP         Arman Filter, NP 04/25/12 2241

## 2014-08-25 LAB — TSH: TSH: 1.74 u[IU]/mL (ref <=5.90)

## 2014-08-25 LAB — CBC AND DIFFERENTIAL
HCT: 33 % — AB (ref 36–46)
Hemoglobin: 9.4 g/dL — AB (ref 12.0–16.0)
Platelets: 407 10*3/uL — AB (ref 150–399)
WBC: 8 10*3/mL

## 2014-08-25 LAB — BASIC METABOLIC PANEL
BUN: 15 mg/dL (ref 4–21)
CREATININE: 0.7 mg/dL (ref <=1.1)
GLUCOSE: 93 mg/dL
Potassium: 4.6 mmol/L (ref 3.4–5.3)
Sodium: 136 mmol/L — AB (ref 137–147)

## 2014-08-25 LAB — HEPATIC FUNCTION PANEL
ALT: 18 U/L (ref 7–35)
AST: 23 U/L (ref 13–35)

## 2014-08-25 LAB — HEMOGLOBIN A1C: HEMOGLOBIN A1C: 6.4

## 2015-05-02 ENCOUNTER — Encounter (HOSPITAL_COMMUNITY): Payer: Self-pay | Admitting: *Deleted

## 2015-05-02 ENCOUNTER — Emergency Department (HOSPITAL_COMMUNITY)
Admission: EM | Admit: 2015-05-02 | Discharge: 2015-05-02 | Disposition: A | Discharge status: Home / Self Care | Payer: 59 | Attending: Emergency Medicine | Admitting: Emergency Medicine

## 2015-05-02 DIAGNOSIS — B353 Tinea pedis: Secondary | ICD-10-CM

## 2015-05-02 DIAGNOSIS — F17210 Nicotine dependence, cigarettes, uncomplicated: Secondary | ICD-10-CM | POA: Diagnosis not present

## 2015-05-02 DIAGNOSIS — E1149 Type 2 diabetes mellitus with other diabetic neurological complication: Secondary | ICD-10-CM | POA: Diagnosis not present

## 2015-05-02 DIAGNOSIS — Z880 Allergy status to penicillin: Secondary | ICD-10-CM | POA: Diagnosis not present

## 2015-05-02 DIAGNOSIS — I10 Essential (primary) hypertension: Secondary | ICD-10-CM | POA: Diagnosis not present

## 2015-05-02 DIAGNOSIS — M79672 Pain in left foot: Secondary | ICD-10-CM | POA: Diagnosis present

## 2015-05-02 DIAGNOSIS — Z8632 Personal history of gestational diabetes: Secondary | ICD-10-CM | POA: Diagnosis not present

## 2015-05-02 DIAGNOSIS — Z79899 Other long term (current) drug therapy: Secondary | ICD-10-CM | POA: Diagnosis not present

## 2015-05-02 HISTORY — DX: Essential (primary) hypertension: I10

## 2015-05-02 LAB — CBG MONITORING, ED: GLUCOSE-CAPILLARY: 99 mg/dL (ref 65–99)

## 2015-05-02 MED ORDER — CLOTRIMAZOLE 1 % EX CREA: TOPICAL_CREAM | CUTANEOUS | Status: DC

## 2015-05-02 MED ORDER — FUROSEMIDE 20 MG PO TABS: 20.0000 mg | ORAL_TABLET | Freq: Every day | ORAL | Status: DC

## 2015-05-02 MED ORDER — LOSARTAN POTASSIUM 25 MG PO TABS: 25.0000 mg | ORAL_TABLET | Freq: Every day | ORAL | Status: DC

## 2015-05-02 MED ORDER — CLINDAMYCIN HCL 150 MG PO CAPS: 300.0000 mg | ORAL_CAPSULE | Freq: Three times a day (TID) | ORAL | Status: AC

## 2015-05-02 MED ORDER — METFORMIN HCL 500 MG PO TABS: 250.0000 mg | ORAL_TABLET | Freq: Two times a day (BID) | ORAL | Status: DC

## 2015-05-02 NOTE — ED Notes (Signed)
Pt reports that she has cracked skin on her left foot in between her third and fourth toe, since Friday. She feels a burning sensation in between her toes. She is diabetic, has been out of her metformin about 2-3 weeks.

## 2015-05-02 NOTE — ED Provider Notes (Signed)
CSN: [id number]     Arrival date & time 05/02/15  0153 History   First MD Initiated Contact with Patient 05/02/15 0215     Chief Complaint  Patient presents with  . Foot Pain     (Consider location/radiation/quality/duration/timing/severity/associated sxs/prior Treatment) Patient is a 40 y.o. female presenting with lower extremity pain. The history is provided by the patient. No language interpreter was used.  Foot Pain This is a new problem. The current episode started in the past 7 days. Associated symptoms include numbness. Pertinent negatives include no fever. Associated symptoms comments: Patient complains of pain at site of laceration in between left 3rd and 4th toes. No known injury. She is a diabetic with history of diabetic neuropathy and usually experiences numbness in her feet, now with pain. No swelling, or fever. .    Past Medical History  Diagnosis Date  . Gestational diabetes   . Hypertension    Past Surgical History  Procedure Laterality Date  . Cesarean section     No family history on file. Social History  Substance Use Topics  . Smoking status: Current Every Day Smoker    Types: Cigarettes  . Smokeless tobacco: None  . Alcohol Use: Yes     Comment: every other day   OB History    No data available     Review of Systems  Constitutional: Negative for fever.  Musculoskeletal:       See HPI.  Skin: Positive for wound.  Neurological: Positive for numbness.      Allergies  Penicillins  Home Medications   Prior to Admission medications   Medication Sig Start Date End Date Taking? Authorizing Provider  furosemide (LASIX) 20 MG tablet Take 1 tablet (20 mg total) by mouth daily. 04/25/12  Yes Junius Creamer, NP  ferrous gluconate (FERGON) 246 (28 FE) MG tablet Take 1 tablet (246 mg total) by mouth daily with breakfast. Patient not taking: Reported on 05/02/2015 05/14/11 05/13/12  Lacretia Leigh, MD  ibuprofen (ADVIL,MOTRIN) 200 MG tablet Take 600 mg by mouth  every 6 (six) hours as needed. pain    Historical Provider, MD  Multiple Vitamin (MULTIVITAMIN) LIQD Take 5 mLs by mouth daily.    Historical Provider, MD   BP 155/88 mmHg  Pulse 98  Temp(Src) 98.9 F (37.2 C) (Oral)  Resp 22  SpO2 100% Physical Exam  Constitutional: She is oriented to person, place, and time. She appears well-developed and well-nourished.  Neck: Normal range of motion.  Pulmonary/Chest: Effort normal.  Musculoskeletal:  Raw, macerated area between 3rd and 4th left toes c/w fungal infection. No swelling of the foot or redness. The area is tender to touch.   Neurological: She is alert and oriented to person, place, and time.  Skin: Skin is warm and dry.    ED Course  Procedures (including critical care time) Labs Review Labs Reviewed  CBG MONITORING, ED    Imaging Review No results found. I have personally reviewed and evaluated these images and lab results as part of my medical decision-making.   EKG Interpretation None      MDM   Final diagnoses:  None    1. Tinea pedis 2. H/o diabetic neuropathy  Discussed foot care and fungal infections of feet. Will provide topical anti-fungal and oral abx given increased pain and risk of infection. Patient reports she is out of multiple medications - will provide Rx for HTN, DM medications. Encourage PCP f/u.    Charlann Lange, PA-C 05/02/15 [phone]  Delora Fuel, MD [id number]

## 2015-05-02 NOTE — Discharge Instructions (Signed)
Athlete's Foot Athlete's foot (tinea pedis) is a fungal infection of the skin on the feet. It often occurs on the skin between the toes or underneath the toes. It can also occur on the soles of the feet. Athlete's foot is more likely to occur in hot, humid weather. Not washing your feet or changing your socks often enough can contribute to athlete's foot. The infection can spread from person to person (contagious). CAUSES Athlete's foot is caused by a fungus. This fungus thrives in warm, moist places. Most people get athlete's foot by sharing shower stalls, towels, and wet floors with an infected person. People with weakened immune systems, including those with diabetes, may be more likely to get athlete's foot. SYMPTOMS   Itchy areas between the toes or on the soles of the feet.  White, flaky, or scaly areas between the toes or on the soles of the feet.  Tiny, intensely itchy blisters between the toes or on the soles of the feet.  Tiny cuts on the skin. These cuts can develop a bacterial infection.  Thick or discolored toenails. DIAGNOSIS  Your caregiver can usually tell what the problem is by doing a physical exam. Your caregiver may also take a skin sample from the rash area. The skin sample may be examined under a microscope, or it may be tested to see if fungus will grow in the sample. A sample may also be taken from your toenail for testing. TREATMENT  Over-the-counter and prescription medicines can be used to kill the fungus. These medicines are available as powders or creams. Your caregiver can suggest medicines for you. Fungal infections respond slowly to treatment. You may need to continue using your medicine for several weeks. PREVENTION   Do not share towels.  Wear sandals in wet areas, such as shared locker rooms and shared showers.  Keep your feet dry. Wear shoes that allow air to circulate. Wear cotton or wool socks. HOME CARE INSTRUCTIONS   Take medicines as directed by  your caregiver. Do not use steroid creams on athlete's foot.  Keep your feet clean and cool. Wash your feet daily and dry them thoroughly, especially between your toes.  Change your socks every day. Wear cotton or wool socks. In hot climates, you may need to change your socks 2 to 3 times per day.  Wear sandals or canvas tennis shoes with good air circulation.  If you have blisters, soak your feet in Burow's solution or Epsom salts for 20 to 30 minutes, 2 times a day to dry out the blisters. Make sure you dry your feet thoroughly afterward. SEEK MEDICAL CARE IF:   You have a fever.  You have swelling, soreness, warmth, or redness in your foot.  You are not getting better after 7 days of treatment.  You are not completely cured after 30 days.  You have any problems caused by your medicines. MAKE SURE YOU:   Understand these instructions.  Will watch your condition.  Will get help right away if you are not doing well or get worse.   This information is not intended to replace advice given to you by your health care provider. Make sure you discuss any questions you have with your health care provider.   Document Released: 03/09/2000 Document Revised: 06/04/2011 Document Reviewed: 09/13/2014 Elsevier Interactive Patient Education 2016 Elsevier Inc.  Blood Glucose Monitoring, Adult Monitoring your blood glucose (also know as blood sugar) helps you to manage your diabetes. It also helps you and your health  care provider monitor your diabetes and determine how well your treatment plan is working. WHY SHOULD YOU MONITOR YOUR BLOOD GLUCOSE?  It can help you understand how food, exercise, and medicine affect your blood glucose.  It allows you to know what your blood glucose is at any given moment. You can quickly tell if you are having low blood glucose (hypoglycemia) or high blood glucose (hyperglycemia).  It can help you and your health care provider know how to adjust your  medicines.  It can help you understand how to manage an illness or adjust medicine for exercise. WHEN SHOULD YOU TEST? Your health care provider will help you decide how often you should check your blood glucose. This may depend on the type of diabetes you have, your diabetes control, or the types of medicines you are taking. Be sure to write down all of your blood glucose readings so that this information can be reviewed with your health care provider. See below for examples of testing times that your health care provider may suggest. Type 1 Diabetes  Test at least 2 times per day if your diabetes is well controlled, if you are using an insulin pump, or if you perform multiple daily injections.  If your diabetes is not well controlled or if you are sick, you may need to test more often.  It is a good idea to also test:  Before every insulin injection.  Before and after exercise.  Between meals and 2 hours after a meal.  Occasionally between 2:00 a.m. and 3:00 a.m. Type 2 Diabetes  If you are taking insulin, test at least 2 times per day. However, it is best to test before every insulin injection.  If you take medicines by mouth (orally), test 2 times a day.  If you are on a controlled diet, test once a day.  If your diabetes is not well controlled or if you are sick, you may need to monitor more often. HOW TO MONITOR YOUR BLOOD GLUCOSE Supplies Needed  Blood glucose meter.  Test strips for your meter. Each meter has its own strips. You must use the strips that go with your own meter.  A pricking needle (lancet).  A device that holds the lancet (lancing device).  A journal or log book to write down your results. Procedure  Wash your hands with soap and water. Alcohol is not preferred.  Prick the side of your finger (not the tip) with the lancet.  Gently milk the finger until a small drop of blood appears.  Follow the instructions that come with your meter for  inserting the test strip, applying blood to the strip, and using your blood glucose meter. Other Areas to Get Blood for Testing Some meters allow you to use other areas of your body (other than your finger) to test your blood. These areas are called alternative sites. The most common alternative sites are:  The forearm.  The thigh.  The back area of the lower leg.  The palm of the hand. The blood flow in these areas is slower. Therefore, the blood glucose values you get may be delayed, and the numbers are different from what you would get from your fingers. Do not use alternative sites if you think you are having hypoglycemia. Your reading will not be accurate. Always use a finger if you are having hypoglycemia. Also, if you cannot feel your lows (hypoglycemia unawareness), always use your fingers for your blood glucose checks. ADDITIONAL TIPS FOR GLUCOSE MONITORING  Do not reuse lancets.  Always carry your supplies with you.  All blood glucose meters have a 24-hour "hotline" number to call if you have questions or need help.  Adjust (calibrate) your blood glucose meter with a control solution after finishing a few boxes of strips. BLOOD GLUCOSE RECORD KEEPING It is a good idea to keep a daily record or log of your blood glucose readings. Most glucose meters, if not all, keep your glucose records stored in the meter. Some meters come with the ability to download your records to your home computer. Keeping a record of your blood glucose readings is especially helpful if you are wanting to look for patterns. Make notes to go along with the blood glucose readings because you might forget what happened at that exact time. Keeping good records helps you and your health care provider to work together to achieve good diabetes management.    This information is not intended to replace advice given to you by your health care provider. Make sure you discuss any questions you have with your health care  provider.   Document Released: 03/15/2003 Document Revised: 04/02/2014 Document Reviewed: 08/04/2012 Elsevier Interactive Patient Education [employer].

## 2015-05-31 ENCOUNTER — Ambulatory Visit (INDEPENDENT_AMBULATORY_CARE_PROVIDER_SITE_OTHER): Payer: 59

## 2015-05-31 ENCOUNTER — Other Ambulatory Visit: Payer: Self-pay | Admitting: Family Medicine

## 2015-05-31 ENCOUNTER — Ambulatory Visit (INDEPENDENT_AMBULATORY_CARE_PROVIDER_SITE_OTHER): Payer: 59 | Admitting: Family Medicine

## 2015-05-31 ENCOUNTER — Ambulatory Visit (HOSPITAL_BASED_OUTPATIENT_CLINIC_OR_DEPARTMENT_OTHER)
Admission: RE | Admit: 2015-05-31 | Discharge: 2015-05-31 | Disposition: A | Discharge status: Home / Self Care | Payer: 59 | Source: Ambulatory Visit | Attending: Family Medicine | Admitting: Family Medicine

## 2015-05-31 ENCOUNTER — Telehealth: Payer: Self-pay | Admitting: Family Medicine

## 2015-05-31 VITALS — BP 129/78 | HR 73 | Temp 98.2°F | Resp 16 | Ht 64.0 in | Wt >= 6400 oz

## 2015-05-31 DIAGNOSIS — I82812 Embolism and thrombosis of superficial veins of left lower extremities: Secondary | ICD-10-CM | POA: Insufficient documentation

## 2015-05-31 DIAGNOSIS — B353 Tinea pedis: Secondary | ICD-10-CM

## 2015-05-31 DIAGNOSIS — Z862 Personal history of diseases of the blood and blood-forming organs and certain disorders involving the immune mechanism: Secondary | ICD-10-CM | POA: Diagnosis not present

## 2015-05-31 DIAGNOSIS — E119 Type 2 diabetes mellitus without complications: Secondary | ICD-10-CM

## 2015-05-31 DIAGNOSIS — I82402 Acute embolism and thrombosis of unspecified deep veins of left lower extremity: Secondary | ICD-10-CM

## 2015-05-31 DIAGNOSIS — L02612 Cutaneous abscess of left foot: Secondary | ICD-10-CM | POA: Diagnosis not present

## 2015-05-31 DIAGNOSIS — D509 Iron deficiency anemia, unspecified: Secondary | ICD-10-CM

## 2015-05-31 DIAGNOSIS — M79605 Pain in left leg: Secondary | ICD-10-CM

## 2015-05-31 LAB — POCT CBC
GRANULOCYTE PERCENT: 71.1 % (ref 37–80)
HEMATOCRIT: 28.5 % — AB (ref 37.7–47.9)
HEMOGLOBIN: 8.7 g/dL — AB (ref 12.2–16.2)
LYMPH, POC: 1.9 (ref 0.6–3.4)
MCH, POC: 19.1 pg — AB (ref 27–31.2)
MCHC: 30.5 g/dL — AB (ref 31.8–35.4)
MCV: 62.6 fL — AB (ref 80–97)
MID (cbc): 0.2 (ref 0–0.9)
MPV: 7.5 fL (ref 0–99.8)
PLATELET COUNT, POC: 416 10*3/uL (ref 142–424)
POC GRANULOCYTE: 5.3 (ref 2–6.9)
POC LYMPH %: 25.6 % (ref 10–50)
POC MID %: 3.3 %M (ref 0–12)
RBC: 4.55 M/uL (ref 4.04–5.48)
RDW, POC: 23.6 %
WBC: 7.4 10*3/uL (ref 4.6–10.2)

## 2015-05-31 LAB — D-DIMER, QUANTITATIVE: D-Dimer, Quant: 3.06 ug/mL-FEU — ABNORMAL HIGH (ref 0.00–0.48)

## 2015-05-31 LAB — FERRITIN: FERRITIN: 13 ng/mL (ref 10–154)

## 2015-05-31 LAB — POCT GLYCOSYLATED HEMOGLOBIN (HGB A1C): Hemoglobin A1C: 5.9

## 2015-05-31 LAB — GLUCOSE, POCT (MANUAL RESULT ENTRY): POC GLUCOSE: 117 mg/dL — AB (ref 70–99)

## 2015-05-31 MED ORDER — TERBINAFINE HCL 250 MG PO TABS: 250.0000 mg | ORAL_TABLET | Freq: Every day | ORAL | Status: DC

## 2015-05-31 MED ORDER — RIVAROXABAN (XARELTO) VTE STARTER PACK (15 & 20 MG): ORAL_TABLET | ORAL | Status: DC

## 2015-05-31 MED ORDER — DOXYCYCLINE HYCLATE 100 MG PO TABS: 100.0000 mg | ORAL_TABLET | Freq: Two times a day (BID) | ORAL | Status: DC

## 2015-05-31 NOTE — Telephone Encounter (Signed)
Alexa [name], with [facility], called with STAT D Dimer: 3.06. Dr. Linna Darner notified and scheduled venous doppler tonight at [name]. Patient notified and voiced understanding. She will go to [place] now and register at

## 2015-05-31 NOTE — Addendum Note (Signed)
Addended by: Keifer Habib H on: 05/31/2015 10:51 PM   Modules accepted: Orders

## 2015-05-31 NOTE — Patient Instructions (Addendum)
Take doxycycline 1 twice daily for infection  Take terbinafine 250 mg 1 daily for 2 weeks for the athlete's foot  Stay off your feet as much as possible for this week to let the swelling go down  It is important that you get back on the iron and take one twice daily for about 2 months, then 1 once daily  Advise you keep your appointment with a foot specialist. I would expect him to manage the foot infection. If you need to return here please do so.  Plan to return in about 6 weeks to get your blood count rechecked. Or go to your primary care doctor.      Because you received an x-ray today, you will receive an invoice from [facility]. Please contact [facility] at [phone] with questions or concerns regarding your invoice. Our billing staff will not be able to assist you with those questions.  Because you received labwork today, you will receive an invoice from [employer]. Please contact [facility] at [phone] with questions or concerns regarding your invoice. Our billing staff will not be able to assist you with those questions.  You will be contacted with the lab results as soon as they are available. The fastest way to get your results is to activate your My Chart account. Instructions are located on the last page of this paperwork. If you have not heard from [place] regarding the results in 2 weeks, please contact this office.

## 2015-05-31 NOTE — Progress Notes (Signed)
10:30 PM D dimer elevated so I ordered doppler. 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  Technically difficult patient to examine.  This will require anticoagulation.  Discussed on phone with patient.  Risks of bleeding explained.  Will rx Xarelto and have her return in 10 days.   15 mg bid x 2weeks , then 20 qd.  Probably for 3 months to 6 mo.

## 2015-05-31 NOTE — Telephone Encounter (Signed)
Noted.  US done and treated.

## 2015-05-31 NOTE — Addendum Note (Signed)
Addended by: Carter Kitten on: 05/31/2015 05:13 PM   Modules accepted: Orders

## 2015-05-31 NOTE — Addendum Note (Signed)
Addended by: Orion Crook on: 05/31/2015 08:11 PM   Modules accepted: Orders

## 2015-05-31 NOTE — Progress Notes (Signed)
Patient ID: Alexa [name], female    DOB: February 17, 1976  Age: 40 y.o. MRN: [id number]  Chief Complaint  Patient presents with  . Foot Problem     left foot has some sharp pain, and is swollen x 2 and half weeks  . Leg Problem    noticed her leg swelling some    Subjective:   40 year old lady who is here because her left foot and leg are bothering her. She wonders whether she could have. Started with having a infection. Third and fourth toe of the left foot. She has been using some Neosporin ointment on it as well as having seen her primary care doctor who recommended some antifungal cream. She has gradually developed more pain in the distal part of the left foot between the metatarsals above that infected area. She also is hurting some in her left large toe joint. She has pain lying on up into the medial thigh region. Knows of no specific injury. She has chronic swelling of the leg, and takes some furosemide for that. She has a history of diabetes and is on metformin. She does have a primary care doctor, but does not see him frequently. Has not been checking her sugars at home though she has a meter.  Current allergies, medications, problem list, past/family and social histories reviewed.  Objective:  BP 129/78 mmHg  Pulse 73  Temp(Src) 98.2 F (36.8 C) (Oral)  Resp 16  Ht 5\' 4"  (1.626 m)  Wt 420 lb (190.511 kg)  BMI 72.06 kg/m2  SpO2 98%  LMP 05/24/2015  Pleasant lady with 2 children. The patient is alert and oriented. She is severely obese. She has very large legs. Right leg has 2+ pitting edema. The left leg has mild tenderness of the left medial thigh. No obvious erythema. The skin is a little scaly and dry. Calf minimally tender. Negative Homans. She has some tenderness in the dorsum of the left foot, especially around the third and fourth inner space. On separating the toes she has some white fibrous appearing tissue between each of the toes but the left third toe has a healed  blistered area and at the webspace there is a small wound hole visible. This was cultured. Mild tenderness of the large PIPs joint, but not obviously red or swollen.  Assessment & Plan:   Assessment: 1. Left leg pain   2. Type 2 diabetes mellitus without complication, without long-term current use of insulin ([place])   3. Foot abscess, left   4. History of anemia   5. Tinea pedis of both feet   6. Microcytic anemia       Plan: Diabetic patient with a foot wound and infection. That could be the sole cause of the discomfort, but need to keep in mind the possibility of blood clotting problems. She is diabetic so diabetic foot problems need to be patent) stage II. She has an appointment with the podiatrist 2 days from now at [facility]. X-ray was has not been done yet. Will do x-ray and labs.  Orders Placed This Encounter  Procedures  . Wound culture    Order Specific Question:  Source    Answer:  left 3rd 4th webspace foot  . DG Foot Complete Left    Standing Status: Future     Number of Occurrences: 1     Standing Expiration Date: 05/30/2016    Order Specific Question:  Reason for Exam (SYMPTOM  OR DIAGNOSIS REQUIRED)    Answer:  infection between third and 4th toes, diabetic    Order Specific Question:  Is the patient pregnant?    Answer:  No     Comments:  last week    Order Specific Question:  Preferred imaging location?    Answer:  External  . D-dimer, quantitative (not at [facility])  . Basic metabolic panel  . Uric acid  . Ferritin  . POCT glucose (manual entry)  . POCT glycosylated hemoglobin (Hb A1C)  . POCT CBC    Meds ordered this encounter  Medications  . NAPROXEN PO    Sig: Take by mouth.  . doxycycline (VIBRA-TABS) 100 MG tablet    Sig: Take 1 tablet (100 mg total) by mouth 2 (two) times daily.    Dispense:  20 tablet    Refill:  0  . terbinafine (LAMISIL) 250 MG tablet    Sig: Take 1 tablet (250 mg total) by mouth daily.    Dispense:  14 tablet    Refill:  0     Results for orders placed or performed in visit on 05/31/15  POCT glucose (manual entry)  Result Value Ref Range   POC Glucose 117 (A) 70 - 99 mg/dl  POCT glycosylated hemoglobin (Hb A1C)  Result Value Ref Range   Hemoglobin A1C 5.9   POCT CBC  Result Value Ref Range   WBC 7.4 4.6 - 10.2 K/uL   Lymph, poc 1.9 0.6 - 3.4   POC LYMPH PERCENT 25.6 10 - 50 %L   MID (cbc) 0.2 0 - 0.9   POC MID % 3.3 0 - 12 %M   POC Granulocyte 5.3 2 - 6.9   Granulocyte percent 71.1 37 - 80 %G   RBC 4.55 4.04 - 5.48 M/uL   Hemoglobin 8.7 (A) 12.2 - 16.2 g/dL   HCT, POC 28.5 (A) 37.7 - 47.9 %   MCV 62.6 (A) 80 - 97 fL   MCH, POC 19.1 (A) 27 - 31.2 pg   MCHC 30.5 (A) 31.8 - 35.4 g/dL   RDW, POC 23.6 %   Platelet Count, POC 416 142 - 424 K/uL   MPV 7.5 0 - 99.8 fL    Fortunately her diabetes is in good control. She will see the podiatrist and they should be able to manage the foot. She probably has a severe iron deficiency anemia, and was urged to take her iron. That is probably increasing the problems with swelling. She is to return in 6 weeks. Because I'm giving her a 2 weeks of terbinafine I am changing her blood test and getting a comprehensive metabolic panel or some baseline liver functions.  Patient Instructions  Take doxycycline 1 twice daily for infection  Take terbinafine 250 mg 1 daily for 2 weeks for the athlete's foot  Stay off your feet as much as possible for this week to let the swelling go down  It is important that you get back on the iron and take one twice daily for about 2 months, then 1 once daily  Advise you keep your appointment with a foot specialist. I would expect him to manage the foot infection. If you need to return here please do so.  Plan to return in about 6 weeks to get your blood count rechecked. Or go to your primary care doctor.      Because you received an x-ray today, you will receive an invoice from [facility]. Please contact [facility]  Radiology at [phone] with questions or concerns regarding your  invoice. Our billing staff will not be able to assist you with those questions.  Because you received labwork today, you will receive an invoice from [employer]. Please contact [facility] at [phone] with questions or concerns regarding your invoice. Our billing staff will not be able to assist you with those questions.  You will be contacted with the lab results as soon as they are available. The fastest way to get your results is to activate your My Chart account. Instructions are located on the last page of this paperwork. If you have not heard from [place] regarding the results in 2 weeks, please contact this office.      Return in about 6 weeks (around 07/12/2015), or if symptoms worsen or fail to improve.   Hailee Hollick, MD 05/31/2015

## 2015-06-01 ENCOUNTER — Encounter: Payer: Self-pay | Admitting: *Deleted

## 2015-06-01 LAB — COMPREHENSIVE METABOLIC PANEL
ALT: 17 U/L (ref 6–29)
AST: 21 U/L (ref 10–30)
Albumin: 3.5 g/dL — ABNORMAL LOW (ref 3.6–5.1)
Alkaline Phosphatase: 54 U/L (ref 33–115)
BILIRUBIN TOTAL: 0.7 mg/dL (ref 0.2–1.2)
BUN: 11 mg/dL (ref 7–25)
CHLORIDE: 99 mmol/L (ref 98–110)
CO2: 30 mmol/L (ref 20–31)
Calcium: 9 mg/dL (ref 8.6–10.2)
Creat: 0.68 mg/dL (ref 0.50–1.10)
Glucose, Bld: 83 mg/dL (ref 65–99)
POTASSIUM: 4.1 mmol/L (ref 3.5–5.3)
SODIUM: 137 mmol/L (ref 135–146)
Total Protein: 7 g/dL (ref 6.1–8.1)

## 2015-06-01 LAB — URIC ACID: URIC ACID, SERUM: 4.6 mg/dL (ref 2.4–7.0)

## 2015-06-02 ENCOUNTER — Ambulatory Visit (INDEPENDENT_AMBULATORY_CARE_PROVIDER_SITE_OTHER): Payer: 59 | Admitting: Podiatry

## 2015-06-02 ENCOUNTER — Encounter: Payer: Self-pay | Admitting: Podiatry

## 2015-06-02 ENCOUNTER — Encounter: Payer: Self-pay | Admitting: Family Medicine

## 2015-06-02 VITALS — BP 144/73 | HR 92 | Resp 16

## 2015-06-02 DIAGNOSIS — M79676 Pain in unspecified toe(s): Secondary | ICD-10-CM | POA: Diagnosis not present

## 2015-06-02 DIAGNOSIS — B351 Tinea unguium: Secondary | ICD-10-CM | POA: Diagnosis not present

## 2015-06-02 DIAGNOSIS — M79674 Pain in right toe(s): Secondary | ICD-10-CM | POA: Diagnosis not present

## 2015-06-02 DIAGNOSIS — L97522 Non-pressure chronic ulcer of other part of left foot with fat layer exposed: Secondary | ICD-10-CM

## 2015-06-02 DIAGNOSIS — L89891 Pressure ulcer of other site, stage 1: Secondary | ICD-10-CM

## 2015-06-02 NOTE — Progress Notes (Signed)
   Subjective:    Patient ID: Alexa [name], female    DOB: 03-03-1976, 40 y.o.   MRN: [id number]  HPI this patient presents to the office today for multiple problems that she is experiencing on both feet. Her first problem is she is experiencing pain, infection, between the third and fourth toes of the left foot. She says there is drainage between these toes and she has been soaking her toes and bandaging after the Epsom salts soak. She says that she is been treated with doxycycline for the infection, which she is taking. She also has noticeable peeling noted along the outside of both the left and the right foot which she says was diagnosed as athletes foot. She is presently taking Lamisil by mouth and applying Lotrimin cream to these sites. She also has thick disfigured discolored toenails on both feet. These nails are painful as she walks and wears her shoes. She also has severe callus noted under the lateral border of both feet. This patient is a diabetic and was referred to this office for continued evaluation and treatment.    Review of Systems  All other systems reviewed and are negative.      Objective:   Physical Exam GENERAL APPEARANCE: Alert, conversant. Appropriately groomed. No acute distress.  VASCULAR: Pedal pulses palpable at  DP right foot.  Her DP is non-palpable left foot.  Her PT pulses are non-palpable due to her severe leg swelling.  .  Capillary refill time is immediate to all digits,  Normal temperature gradient. NEUROLOGIC: sensation is normal to 5.07 monofilament at 5/5 sites bilateral.  Light touch is intact bilateral, Muscle strength normal.  MUSCULOSKELETAL: acceptable muscle strength, tone and stability bilateral.  Intrinsic muscluature intact bilateral.  Rectus appearance of foot and digits noted bilateral. Severe leg and foot swelling noted.  DERMATOLOGIC: There is an ulcer between her third and fourth toes left foot.  No drainage or redness or infection noted at  this visit.  No malodor noted.  Ulcer is noted lateral to base third base proximal phalanx. Severe hyperkeratosis along the lateral aspect plantar foot.  Peeling and inflammation noted lateral aspect both feet.   NAILS  Thick disfigured discolored nails both feet.  No infections noted to nails.         Assessment & Plan:  Diabetic ulcer left foot   Onychomycosis B/L    IE  Debride necrotic tissue ulcer left foot.  Betadine ointment/DSD applied.  Home instructions given for soaks and rebandaging.  Told her to wear loose fitting shoes/ slippers. Debridement of nails.  Continue to apply fungus topical and take her meds for athletes foot and antibiotics to prevent infection.  If this condition worsens or becomes very painful, the patient was told to contact this office or go to the Emergency Department at the hospital.  Gardiner Barefoot DPM

## 2015-06-03 LAB — WOUND CULTURE
GRAM STAIN: NONE SEEN
GRAM STAIN: NONE SEEN
GRAM STAIN: NONE SEEN

## 2015-06-15 ENCOUNTER — Ambulatory Visit: Payer: 59 | Admitting: Podiatry

## 2015-06-22 ENCOUNTER — Ambulatory Visit (INDEPENDENT_AMBULATORY_CARE_PROVIDER_SITE_OTHER): Payer: 59 | Admitting: Physician Assistant

## 2015-06-22 VITALS — BP 132/85 | HR 93 | Temp 98.6°F | Resp 20 | Ht 64.0 in | Wt >= 6400 oz

## 2015-06-22 DIAGNOSIS — Z6841 Body Mass Index (BMI) 40.0 and over, adult: Secondary | ICD-10-CM | POA: Insufficient documentation

## 2015-06-22 DIAGNOSIS — E1149 Type 2 diabetes mellitus with other diabetic neurological complication: Secondary | ICD-10-CM

## 2015-06-22 DIAGNOSIS — I10 Essential (primary) hypertension: Secondary | ICD-10-CM | POA: Insufficient documentation

## 2015-06-22 DIAGNOSIS — L02612 Cutaneous abscess of left foot: Secondary | ICD-10-CM | POA: Diagnosis not present

## 2015-06-22 DIAGNOSIS — I82812 Embolism and thrombosis of superficial veins of left lower extremities: Secondary | ICD-10-CM | POA: Diagnosis not present

## 2015-06-22 DIAGNOSIS — E559 Vitamin D deficiency, unspecified: Secondary | ICD-10-CM | POA: Insufficient documentation

## 2015-06-22 DIAGNOSIS — IMO0001 Reserved for inherently not codable concepts without codable children: Secondary | ICD-10-CM | POA: Insufficient documentation

## 2015-06-22 DIAGNOSIS — D509 Iron deficiency anemia, unspecified: Secondary | ICD-10-CM | POA: Insufficient documentation

## 2015-06-22 MED ORDER — RIVAROXABAN 20 MG PO TABS: 20.0000 mg | ORAL_TABLET | Freq: Every day | ORAL | Status: DC

## 2015-06-22 NOTE — Progress Notes (Signed)
Patient ID: Alexa [name], female    DOB: Nov 06, 1975, 40 y.o.   MRN: [id number]  PCP: Benito Mccreedy, MD  Subjective:   Chief Complaint  Patient presents with  . Medication Refill    recheck Meds  . Foot Swelling    ulceration 3/4 toes     HPI Presents for evaluation of an ulceration between the 3rd and 4th toes on the LEFT foot, and for medication refill (Xarelto).  Diagnosed here with diabetic foot ulcer on 3/07. She was started on Doxycycline and oral terbinafine and was advised to proceed with podiatry appointment that was already scheduled for 2 days later. D-dimer ordered to assess LE edema was positive, and the patient subsequently was found to have a Thrombosis of saphenofemoral junction, technically not DVT but is directly to the deep vein. She was started on Xarelto and advised to have follow-up labs in 6 weeks here or with her PCP.   She saw podiatry on 3/09 as scheduled and the ulcer was debrided. She was given instructions for wound care and to follow up in 2 weeks. Her appointment was to be last week, but it was cancelled and she has not yet re-scheduled. She has some questions about the wound care (covering the wound vs. Leaving it open, etc. Unfortunately, the instructions are not found in the podiatry note nor the AVS from that visit.  Has come to the end of the Xarelto starter pack. No increased bruising. No bleeding gums. She notes no blood in stool, but it does look darker (she is also taking iron supplement for anemia). Had a brief episode of lightheadedness this afternoon, and briefly yesterday. No dizziness. Some fatigue last night-wasn't able to sleep well due to her toes tingling. Sleep is also disrupted by frequent urination. She takes furosemide in the afternoons, as she isn't able to take frequent bathroom breaks at work.    Review of Systems As above. No fever, chills.  No CP, SOB, headache.    Patient Active Problem List   Diagnosis Date  Noted  . Diabetes ([place]) 06/22/2015  . Benign essential HTN 06/22/2015  . DVT (deep venous thrombosis) ([place]) 06/22/2015  . BMI 70 and over, adult ([place]) 06/22/2015  . Vitamin D deficiency 06/22/2015  . Iron deficiency anemia 06/22/2015     Prior to Admission medications   Medication Sig Start Date End Date Taking? Authorizing Provider  clotrimazole (LOTRIMIN) 1 % cream Apply to affected area 2 times daily 05/02/15  Yes Shari Upstill, PA-C  furosemide (LASIX) 20 MG tablet Take 1 tablet (20 mg total) by mouth daily. 05/02/15  Yes Shari Upstill, PA-C  IRON PO Take by mouth.   Yes Historical Provider, MD  losartan-hydrochlorothiazide (HYZAAR) 100-12.5 MG tablet Take 1 tablet by mouth daily. 05/17/15  Yes Historical Provider, MD  metFORMIN (GLUCOPHAGE) 500 MG tablet Take 0.5 tablets (250 mg total) by mouth 2 (two) times daily with a meal. 05/02/15  Yes Charlann Lange, PA-C  Rivaroxaban (XARELTO STARTER PACK) 15 & 20 MG TBPK Start with one 15mg  tablet by mouth twice a day with food. On Day 22, switch to one 20mg  tablet once a day with food. 05/31/15  Yes Posey Boyer, MD     Allergies  Allergen Reactions  . Penicillins Hives    Has patient had a PCN reaction causing immediate rash, facial/tongue/throat swelling, SOB or lightheadedness with hypotension: No Has patient had a PCN reaction causing severe rash involving mucus membranes or skin necrosis: No Has patient  had a PCN reaction that required hospitalization No Has patient had a PCN reaction occurring within the last 10 years: No If all of the above answers are "NO", then may proceed with Cephalosporin use.       Objective:  Physical Exam  Constitutional: She is oriented to person, place, and time. She appears well-developed and well-nourished. She is active and cooperative. No distress.  BP 132/85 mmHg  Pulse 93  Temp(Src) 98.6 F (37 C) (Oral)  Resp 20  Ht 5\' 4"  (1.626 m)  Wt 416 lb 12.8 oz (189.059 kg)  BMI 71.51 kg/m2  SpO2 96%   LMP 05/24/2015  HENT:  Head: Normocephalic and atraumatic.  Right Ear: Hearing normal.  Left Ear: Hearing normal.  Eyes: Conjunctivae are normal. No scleral icterus.  Neck: Normal range of motion. Neck supple. No thyromegaly present.  Cardiovascular: Normal rate, regular rhythm and normal heart sounds.   Pulses:      Radial pulses are 2+ on the right side, and 2+ on the left side.  Pulmonary/Chest: Effort normal and breath sounds normal.  Lymphadenopathy:       Head (right side): No tonsillar, no preauricular, no posterior auricular and no occipital adenopathy present.       Head (left side): No tonsillar, no preauricular, no posterior auricular and no occipital adenopathy present.    She has no cervical adenopathy.       Right: No supraclavicular adenopathy present.       Left: No supraclavicular adenopathy present.  Neurological: She is alert and oriented to person, place, and time. No sensory deficit.  Skin: Skin is warm, dry and intact. No rash noted. No cyanosis or erythema. Nails show no clubbing.  Skin of the LE is hyperpigmented and thickened. Macerated skin between the toes, with small ulcer between the 3rd and 4th toes. No erythema. No drainage. Not malodorous.   Psychiatric: She has a normal mood and affect. Her speech is normal and behavior is normal.           Assessment & Plan:   1. Foot abscess, left No evidence of recurrent cellulitis. Contact podiatry tomorrow to reschedule follow-up and clarify wound care instructions.  2. Thrombosis of left saphenous vein Continue Xarelto for 3-6 months. She will need to follow-up with primary care. - rivaroxaban (XARELTO) 20 MG TABS tablet; Take 1 tablet (20 mg total) by mouth daily with supper.  Dispense: 90 tablet; Refill: 1   Fara Chute, PA-C Physician Assistant-Certified [facility]

## 2015-06-22 NOTE — Patient Instructions (Addendum)
Call Dr. Burnell Blanks office to schedule the follow-up visit and to clarify the bandage/wound care instructions.    IF you received an x-ray today, you will receive an invoice from [facility]. Please contact [facility] at [phone] with questions or concerns regarding your invoice.   IF you received labwork today, you will receive an invoice from [employer]. Please contact [facility] at [phone] with questions or concerns regarding your invoice.   Our billing staff will not be able to assist you with questions regarding bills from these companies.  You will be contacted with the lab results as soon as they are available. The fastest way to get your results is to activate your My Chart account. Instructions are located on the last page of this paperwork. If you have not heard from [place] regarding the results in 2 weeks, please contact this office.

## 2015-07-04 ENCOUNTER — Institutional Professional Consult (permissible substitution): Payer: 59 | Admitting: Pulmonary Disease

## 2015-09-26 ENCOUNTER — Telehealth: Payer: Self-pay

## 2015-09-26 NOTE — Telephone Encounter (Signed)
Patient is requesting a refill for metformin and lasix.  [facility] [address] Patient phone: [phone]

## 2015-09-28 ENCOUNTER — Ambulatory Visit: Payer: 59

## 2015-09-29 NOTE — Telephone Encounter (Signed)
We have not Rxd these for pt before, but Dr Linna Darner saw her on 05/31/15 and discussed both of these meds and did A1C on pt which was 5.9. She was to return in 6 wks. She returned 3/9 and 3/29, but not back since. I'm not sure whether Dr Linna Darner intended to Rx these meds for pt when she needed them since he did the lab and discussed?

## 2015-10-05 NOTE — Telephone Encounter (Signed)
Call patient:  We had never not prescribed those meds for her.  She may already have seen her PCP for them, but if necessary can come in to be checked so someone here can rx what is needed.  She was supposed to come in late April to recheck her blood count for the anemia.  It is fine if she has a primary care doctor to let that doctor follow it, but I think she needs to see someone to get the CBC rechecked.

## 2015-10-08 ENCOUNTER — Telehealth: Payer: Self-pay

## 2015-10-08 ENCOUNTER — Ambulatory Visit (INDEPENDENT_AMBULATORY_CARE_PROVIDER_SITE_OTHER): Payer: BLUE CROSS/BLUE SHIELD | Admitting: Family Medicine

## 2015-10-08 VITALS — BP 118/90 | HR 108 | Temp 98.6°F | Resp 16 | Ht 64.0 in | Wt >= 6400 oz

## 2015-10-08 DIAGNOSIS — B353 Tinea pedis: Secondary | ICD-10-CM | POA: Diagnosis not present

## 2015-10-08 DIAGNOSIS — Z6841 Body Mass Index (BMI) 40.0 and over, adult: Secondary | ICD-10-CM

## 2015-10-08 DIAGNOSIS — I82812 Embolism and thrombosis of superficial veins of left lower extremities: Secondary | ICD-10-CM | POA: Diagnosis not present

## 2015-10-08 DIAGNOSIS — L97509 Non-pressure chronic ulcer of other part of unspecified foot with unspecified severity: Secondary | ICD-10-CM | POA: Diagnosis not present

## 2015-10-08 DIAGNOSIS — D509 Iron deficiency anemia, unspecified: Secondary | ICD-10-CM

## 2015-10-08 DIAGNOSIS — E11621 Type 2 diabetes mellitus with foot ulcer: Secondary | ICD-10-CM | POA: Diagnosis not present

## 2015-10-08 DIAGNOSIS — I10 Essential (primary) hypertension: Secondary | ICD-10-CM

## 2015-10-08 DIAGNOSIS — E559 Vitamin D deficiency, unspecified: Secondary | ICD-10-CM | POA: Diagnosis not present

## 2015-10-08 LAB — LDL CHOLESTEROL, DIRECT: LDL DIRECT: 100 mg/dL (ref <130)

## 2015-10-08 LAB — CBC
HCT: 43.3 % (ref 35.0–45.0)
Hemoglobin: 14.4 g/dL (ref 11.7–15.5)
MCH: 27.6 pg (ref 27.0–33.0)
MCHC: 33.3 g/dL (ref 32.0–36.0)
MCV: 83 fL (ref 80.0–100.0)
MPV: 9.4 fL (ref 7.5–12.5)
PLATELETS: 319 10*3/uL (ref 140–400)
RBC: 5.22 MIL/uL — AB (ref 3.80–5.10)
RDW: 17.2 % — AB (ref 11.0–15.0)
WBC: 8 10*3/uL (ref 3.8–10.8)

## 2015-10-08 LAB — COMPREHENSIVE METABOLIC PANEL
ALK PHOS: 46 U/L (ref 33–115)
ALT: 16 U/L (ref 6–29)
AST: 19 U/L (ref 10–30)
Albumin: 3.9 g/dL (ref 3.6–5.1)
BILIRUBIN TOTAL: 0.5 mg/dL (ref 0.2–1.2)
BUN: 13 mg/dL (ref 7–25)
CO2: 28 mmol/L (ref 20–31)
Calcium: 9.1 mg/dL (ref 8.6–10.2)
Chloride: 101 mmol/L (ref 98–110)
Creat: 0.91 mg/dL (ref 0.50–1.10)
GLUCOSE: 88 mg/dL (ref 65–99)
Potassium: 4.1 mmol/L (ref 3.5–5.3)
Sodium: 137 mmol/L (ref 135–146)
Total Protein: 7 g/dL (ref 6.1–8.1)

## 2015-10-08 LAB — IRON AND TIBC
%SAT: 9 % — ABNORMAL LOW (ref 11–50)
Iron: 38 ug/dL — ABNORMAL LOW (ref 40–190)
TIBC: 416 ug/dL (ref 250–450)
UIBC: 378 ug/dL (ref 125–400)

## 2015-10-08 LAB — FERRITIN: Ferritin: 27 ng/mL (ref 10–154)

## 2015-10-08 MED ORDER — METFORMIN HCL 500 MG PO TABS: 500.0000 mg | ORAL_TABLET | Freq: Two times a day (BID) | ORAL | Status: DC

## 2015-10-08 MED ORDER — LOSARTAN POTASSIUM-HCTZ 100-12.5 MG PO TABS: 1.0000 | ORAL_TABLET | Freq: Every day | ORAL | Status: DC

## 2015-10-08 MED ORDER — FUROSEMIDE 20 MG PO TABS: 20.0000 mg | ORAL_TABLET | Freq: Every day | ORAL | Status: DC

## 2015-10-08 MED ORDER — RIVAROXABAN 20 MG PO TABS: 20.0000 mg | ORAL_TABLET | Freq: Every day | ORAL | Status: DC

## 2015-10-08 NOTE — Telephone Encounter (Signed)
Pt was seen today and wanted to know if she was suppose to put the cream on her toe that was infected or just use Vaseline?  Please advise    [phone]

## 2015-10-08 NOTE — Telephone Encounter (Signed)
Left message on patient's cell vm to come in to be seen for prescriptions needed.

## 2015-10-08 NOTE — Progress Notes (Signed)
Alexa [name] is a 40 y.o. female who presents to [facility] today for diabetes hypertension and obesity.  Diabetes: Patient has a history of well-controlled diabetes. She takes metformin 250 mg twice daily. Previously she managed to lose about 50 pounds with diet but has subsequently regained it. She denies any polyuria or polydipsia. Pertinent diabetic related, occasion recently in the last 6 months with a infection in her leg.. This has resolved and she feels well. She does routine foot care.  Hypertension: No chest pain palpitations or shortness of breath.  Obesity: As noted above patient is morbidly obese. She has regained around 40 pounds in the last 3 months. She has met with bariatric surgery in the past but has not followed through on any further interventions. She is interested in bariatric surgery is a possibility to lose weight.  Athlete's feet. Patient has athlete's foot bilaterally. She has stopped using clotrimazole cream. She denies any pain or itching.  DVT left leg occurring in early March. Patient has been on Xarelto tolerates it well with no bleeding.   Past Medical History  Diagnosis Date  . Gestational diabetes   . Hypertension    Past Surgical History  Procedure Laterality Date  . Cesarean section     Social History  Substance Use Topics  . Smoking status: Current Some Day Smoker    Types: Cigarettes  . Smokeless tobacco: Never Used     Comment: only smokes when she drinks alcohol  . Alcohol Use: 2.4 - 3.6 oz/week    4-6 Standard drinks or equivalent per week     Comment: 1-2 times/weeks   ROS as above Medications: Current Outpatient Prescriptions  Medication Sig Dispense Refill  . clotrimazole (LOTRIMIN) 1 % cream Apply to affected area 2 times daily 15 g 0  . furosemide (LASIX) 20 MG tablet Take 1 tablet (20 mg total) by mouth daily. 90 tablet 0  . IRON PO Take by mouth.    . losartan-hydrochlorothiazide (HYZAAR) 100-12.5 MG tablet Take 1 tablet  by mouth daily. 90 tablet 0  . metFORMIN (GLUCOPHAGE) 500 MG tablet Take 1 tablet (500 mg total) by mouth 2 (two) times daily with a meal. 180 tablet 0  . rivaroxaban (XARELTO) 20 MG TABS tablet Take 1 tablet (20 mg total) by mouth daily with supper. 90 tablet 0   No current facility-administered medications for this visit.   Allergies  Allergen Reactions  . Penicillins Hives    Has patient had a PCN reaction causing immediate rash, facial/tongue/throat swelling, SOB or lightheadedness with hypotension: No Has patient had a PCN reaction causing severe rash involving mucus membranes or skin necrosis: No Has patient had a PCN reaction that required hospitalization No Has patient had a PCN reaction occurring within the last 10 years: No If all of the above answers are "NO", then may proceed with Cephalosporin use.     Exam:  BP 118/90 mmHg  Pulse 108  Temp(Src) 98.6 F (37 C) (Oral)  Resp 16  Ht '5\' 4"'$  (1.626 m)  Wt 458 lb (207.747 kg)  BMI 78.58 kg/m2  SpO2 94%  LMP 09/14/2015 (Approximate) Gen: Well NAD Morbidly obese HEENT: EOMI,  MMM Lungs: Normal work of breathing. CTABL Heart: RRR no MRG Abd: NABS, Soft. Nondistended, Nontender Exts: Brisk capillary refill, warm and well perfused.  Feet bilaterally have whitish material between the interdigital web spaces between the toes. Nontender no ulcerations are present. Capillary refill sensation and pulses are intact.  No results  found for this or any previous visit (from the past 24 hour(s)). No results found.  Assessment and Plan: 40 y.o. female with   Diabetes: Due to recent weight gain will increase metformin to 500 mg twice daily. Check A1c today along with metabolic panel and LDL.  Hypertension: Doing reasonably well. Continue current regimen. Check labs as above.  Morbid obesity: This is a fundamental health problem. I don't see Ms. Krul really doing well or having a long healthy life with a BMI of 78. We had a  discussion about this. Plan to refer to bariatric surgery.  Athlete's foot: Continue clotrimazole. Continue routine foot cleaning and maintenance.  DVT: Patient had a DVT of the left leg. This occurred about 3 months ago. She's been on Xarelto for the last 3 months.  This is a first DVT. Continue Xarelto for 3 more months. Return for recheck in 3 months.  Discussed warning signs or symptoms. Please see discharge instructions. Patient expresses understanding.

## 2015-10-08 NOTE — Patient Instructions (Addendum)
  Thank you for coming in today. Return in 3 months.  You should hear from the Bariatric Doctors soon.  Return sooner if needed.      IF you received an x-ray today, you will receive an invoice from [facility]. Please contact [facility] at [phone] with questions or concerns regarding your invoice.   IF you received labwork today, you will receive an invoice from [employer]. Please contact [facility] at [phone] with questions or concerns regarding your invoice.   Our billing staff will not be able to assist you with questions regarding bills from these companies.  You will be contacted with the lab results as soon as they are available. The fastest way to get your results is to activate your My Chart account. Instructions are located on the last page of this paperwork. If you have not heard from [place] regarding the results in 2 weeks, please contact this office.

## 2015-10-09 LAB — HEMOGLOBIN A1C
Hgb A1c MFr Bld: 6 % — ABNORMAL HIGH (ref <5.7)
Mean Plasma Glucose: 126 mg/dL

## 2015-10-10 LAB — VITAMIN D 25 HYDROXY (VIT D DEFICIENCY, FRACTURES): Vit D, 25-Hydroxy: 33 ng/mL (ref 30–100)

## 2015-10-10 NOTE — Telephone Encounter (Signed)
Tried to call pt. But was unable to get through, under Dr. Georgina Snell Assessment and Plan it says: Athlete's foot: Continue clotrimazole. Continue routine foot cleaning and maintenance.

## 2015-10-11 NOTE — Telephone Encounter (Signed)
LMVM for patient that we were returning her call and to please call back for information.

## 2015-10-12 NOTE — Telephone Encounter (Signed)
Spoke with pt and informed her of Dr Ocie Cornfield assessment and plan

## 2015-10-18 ENCOUNTER — Encounter: Payer: Self-pay | Admitting: *Deleted

## 2015-12-30 ENCOUNTER — Ambulatory Visit (INDEPENDENT_AMBULATORY_CARE_PROVIDER_SITE_OTHER): Payer: BLUE CROSS/BLUE SHIELD | Admitting: Physician Assistant

## 2015-12-30 ENCOUNTER — Ambulatory Visit (INDEPENDENT_AMBULATORY_CARE_PROVIDER_SITE_OTHER): Payer: BLUE CROSS/BLUE SHIELD

## 2015-12-30 VITALS — HR 98 | Temp 98.5°F | Resp 16 | Ht 64.0 in | Wt >= 6400 oz

## 2015-12-30 DIAGNOSIS — M545 Low back pain: Secondary | ICD-10-CM | POA: Diagnosis not present

## 2015-12-30 DIAGNOSIS — R609 Edema, unspecified: Secondary | ICD-10-CM

## 2015-12-30 LAB — COMPLETE METABOLIC PANEL WITH GFR
ALT: 21 U/L (ref 6–29)
AST: 18 U/L (ref 10–30)
Albumin: 3.6 g/dL (ref 3.6–5.1)
Alkaline Phosphatase: 45 U/L (ref 33–115)
BILIRUBIN TOTAL: 0.6 mg/dL (ref 0.2–1.2)
BUN: 14 mg/dL (ref 7–25)
CALCIUM: 9 mg/dL (ref 8.6–10.2)
CO2: 34 mmol/L — AB (ref 20–31)
Chloride: 98 mmol/L (ref 98–110)
Creat: 1.2 mg/dL — ABNORMAL HIGH (ref 0.50–1.10)
GFR, EST AFRICAN AMERICAN: 67 mL/min (ref >=60)
GFR, EST NON AFRICAN AMERICAN: 58 mL/min — AB (ref >=60)
Glucose, Bld: 84 mg/dL (ref 65–99)
Potassium: 4.5 mmol/L (ref 3.5–5.3)
Sodium: 138 mmol/L (ref 135–146)
TOTAL PROTEIN: 6.8 g/dL (ref 6.1–8.1)

## 2015-12-30 LAB — POCT URINALYSIS DIP (MANUAL ENTRY)
Bilirubin, UA: NEGATIVE
GLUCOSE UA: NEGATIVE
Leukocytes, UA: NEGATIVE
NITRITE UA: NEGATIVE
PH UA: 6.5
PROTEIN UA: NEGATIVE
Spec Grav, UA: 1.025
UROBILINOGEN UA: 2

## 2015-12-30 LAB — POCT URINE PREGNANCY: PREG TEST UR: NEGATIVE

## 2015-12-30 NOTE — Progress Notes (Signed)
Patient ID: Alexa [name], female   DOB: 03-24-1976, 40 y.o.   MRN: [id number]   By signing my name below I, Tereasa Coop, attest that this documentation has been prepared under the direction and in the presence of Philis Fendt, [place]. Electonically Signed. Tereasa Coop, Scribe 12/30/2015 at 12:33 PM  12/30/2015 12:54 PM   DOB: 08/29/1975 / MRN: [id number]   SUBJECTIVE:  Alexa [name] is a 40 y.o. female presenting for low back pain that started this morning.  Pt reports that 4 das ago pt first noticed a cramping low back pain after she stood up from using the bathroom in the morning. Pt had bilat leg weakness during initial onset of pain. Weakness resolved spontaneously. Pain has been improving until this morning when she bent over, pulling up her pants and had sudden onset of worsening low back pain. Pt has been taking tylenol to control pain with mild relief.  Pt denies any radiation of pain, lower extremity weakness, numbness, urinary urgency, dysuria, urinary or bowel incontinence.  Pt denies any history of trauma or surgery to her back.  Pt takes lasix to control lower extremity edema. Pt prefers to sleep sitting up. Pt is not followed by a cardiologist.   Pt has been an active smoker for the past 15 years.   Lab Results  Component Value Date   CREATININE 0.91 10/08/2015   Lab Results  Component Value Date   NA 137 10/08/2015   K 4.1 10/08/2015   CL 101 10/08/2015   CO2 28 10/08/2015   Pt has DM.  Lab Results  Component Value Date   HGBA1C 6.0 (H) 10/08/2015     She is allergic to penicillins.   She  has a past medical history of Gestational diabetes and Hypertension.    She  reports that she has been smoking Cigarettes.  She has never used smokeless tobacco. She reports that she drinks about 2.4 - 3.6 oz of alcohol per week . She reports that she does not use drugs. She  has no sexual activity history on file. The patient  has a past surgical history that  includes Cesarean section.  Her family history includes Diabetes in her maternal grandmother; Hypertension in her mother.  Review of Systems  Constitutional: Negative for fever.  Cardiovascular: Positive for leg swelling (taking lasix).  Genitourinary: Negative for dysuria, frequency and urgency.  Musculoskeletal: Positive for back pain (low).  Skin: Negative for rash.  Neurological: Negative for dizziness, tingling, focal weakness and weakness.    The problem list and medications were reviewed and updated by myself where necessary and exist elsewhere in the encounter.   OBJECTIVE:  Pulse 98   Temp 98.5 F (36.9 C) (Oral)   Resp 16   Ht 5\' 4"  (1.626 m)   Wt (!) 438 lb (198.7 kg)   LMP 12/24/2015 (Exact Date)   SpO2 95%   BMI 75.18 kg/m   Physical Exam  Constitutional: She is oriented to person, place, and time. She appears well-developed and well-nourished. No distress.  HENT:  Head: Normocephalic and atraumatic.  Eyes: Conjunctivae are normal. Pupils are equal, round, and reactive to light.  Neck: Neck supple.  Cardiovascular: Normal rate.   Pulmonary/Chest:  Pt's breathing is labored, otherwise clear.   Musculoskeletal: Normal range of motion.       Lumbar back: She exhibits no tenderness, no deformity and no spasm.  Neurological: She is alert and oriented to person, place, and time. She has normal strength.  No sensory deficit.  Reflex Scores:      Patellar reflexes are 2+ on the right side and 2+ on the left side.      Achilles reflexes are 2+ on the right side and 2+ on the left side. Sensation intact and equal bilat along all dermatomes in the lower extremities.   Skin: Skin is warm and dry. No rash noted.  Psychiatric: She has a normal mood and affect. Her behavior is normal.  Nursing note and vitals reviewed.  EKG viewed and interpreted by Philis Fendt.  EKG shows Normal sinus rhythm, normal rate, normal axis. EKG negative for LVH and Hypertrophy. There is no  evidence of ischemia or infarction.   Results for orders placed or performed in visit on 12/30/15 (from the past 72 hour(s))  POCT urinalysis dipstick     Status: Abnormal   Collection Time: 12/30/15 12:12 PM  Result Value Ref Range   Color, UA yellow yellow   Clarity, UA clear clear   Glucose, UA negative negative   Bilirubin, UA negative negative   Ketones, POC UA trace (5) (A) negative   Spec Grav, UA 1.025    Blood, UA moderate (A) negative   pH, UA 6.5    Protein Ur, POC negative negative   Urobilinogen, UA 2.0    Nitrite, UA Negative Negative   Leukocytes, UA Negative Negative  POCT urine pregnancy     Status: None   Collection Time: 12/30/15 12:13 PM  Result Value Ref Range   Preg Test, Ur Negative Negative    Dg Chest 2 View  Result Date: 12/30/2015 CLINICAL DATA:  Hypertension.  Peripheral edema EXAM: CHEST  2 VIEW COMPARISON:  April 25, 2012 FINDINGS: There is no edema or consolidation. The heart size and pulmonary vascularity are within normal limits. No adenopathy. There is degenerative change in the lower thoracic spine. IMPRESSION: No edema or consolidation. Electronically Signed   By: Lowella Grip III M.D.   On: 12/30/2015 12:44   Dg Lumbar Spine 2-3 Views  Result Date: 12/30/2015 CLINICAL DATA:  Back pain. EXAM: LUMBAR SPINE - 2-3 VIEW COMPARISON:  No recent prior. FINDINGS: Diffuse mild degenerative changes lumbar spine. No acute bony abnormality. Normal alignment and mineralization. IMPRESSION: Diffuse mild degenerative changes lumbar spine, otherwise negative exam. Electronically Signed   By: Marcello Moores  Register   On: 12/30/2015 12:45    ASSESSMENT AND PLAN  Emmarie was seen today for back pain.  Diagnoses and all orders for this visit:  Peripheral edema -     Brain natriuretic peptide -     DG Chest 2 View; Future -     EKG 12-Lead -     COMPLETE METABOLIC PANEL WITH GFR -     POCT urinalysis dipstick -     POCT urine pregnancy -     ECHOCARDIOGRAM  COMPLETE; Future  Acute bilateral low back pain, with sciatica presence unspecified: Rads negative. Advised rest and tylenol.  Avoiding NSAIDs given problem one.   -     DG Lumbar Spine 2-3 Views; Future     The patient is advised to call or return to clinic if she does not see an improvement in symptoms, or to seek the care of the closest emergency department if she worsens with the above plan.   Philis Fendt, MHS, PA-C [facility] 12/30/2015 12:54 PM  This note was scribed in my presence and I performed the services described in the  this documentation.

## 2015-12-30 NOTE — Patient Instructions (Signed)
     IF you received an x-ray today, you will receive an invoice from [facility]. Please contact [facility] at [phone] with questions or concerns regarding your invoice.   IF you received labwork today, you will receive an invoice from [facility]. Please contact [facility] at [phone] with questions or concerns regarding your invoice.   Our billing staff will not be able to assist you with questions regarding bills from these companies.  You will be contacted with the lab results as soon as they are available. The fastest way to get your results is to activate your My Chart account. Instructions are located on the last page of this paperwork. If you have not heard from us regarding the results in 2 weeks, please contact this office.      

## 2015-12-31 LAB — BRAIN NATRIURETIC PEPTIDE: BRAIN NATRIURETIC PEPTIDE: 22.3 pg/mL (ref <100)

## 2016-01-13 ENCOUNTER — Other Ambulatory Visit (HOSPITAL_COMMUNITY): Payer: 59

## 2016-01-28 ENCOUNTER — Ambulatory Visit (INDEPENDENT_AMBULATORY_CARE_PROVIDER_SITE_OTHER): Payer: BLUE CROSS/BLUE SHIELD | Admitting: Physician Assistant

## 2016-01-28 ENCOUNTER — Encounter: Payer: Self-pay | Admitting: Physician Assistant

## 2016-01-28 VITALS — BP 162/90 | HR 98 | Temp 98.2°F | Resp 18 | Ht 64.0 in | Wt >= 6400 oz

## 2016-01-28 DIAGNOSIS — Z7189 Other specified counseling: Secondary | ICD-10-CM

## 2016-01-28 DIAGNOSIS — B353 Tinea pedis: Secondary | ICD-10-CM | POA: Diagnosis not present

## 2016-01-28 DIAGNOSIS — N926 Irregular menstruation, unspecified: Secondary | ICD-10-CM

## 2016-01-28 DIAGNOSIS — E785 Hyperlipidemia, unspecified: Secondary | ICD-10-CM

## 2016-01-28 DIAGNOSIS — L97509 Non-pressure chronic ulcer of other part of unspecified foot with unspecified severity: Secondary | ICD-10-CM

## 2016-01-28 DIAGNOSIS — E11621 Type 2 diabetes mellitus with foot ulcer: Secondary | ICD-10-CM | POA: Diagnosis not present

## 2016-01-28 DIAGNOSIS — R609 Edema, unspecified: Secondary | ICD-10-CM

## 2016-01-28 DIAGNOSIS — Z862 Personal history of diseases of the blood and blood-forming organs and certain disorders involving the immune mechanism: Secondary | ICD-10-CM | POA: Diagnosis not present

## 2016-01-28 DIAGNOSIS — I10 Essential (primary) hypertension: Secondary | ICD-10-CM

## 2016-01-28 DIAGNOSIS — Z6841 Body Mass Index (BMI) 40.0 and over, adult: Secondary | ICD-10-CM

## 2016-01-28 LAB — COMPLETE METABOLIC PANEL WITH GFR
Albumin: 3.4 g/dL — ABNORMAL LOW (ref 3.6–5.1)
Alkaline Phosphatase: 44 U/L (ref 33–115)
CO2: 34 mmol/L — ABNORMAL HIGH (ref 20–31)
Chloride: 100 mmol/L (ref 98–110)
GFR, Est Non African American: 81 mL/min (ref >=60)
Glucose, Bld: 112 mg/dL — ABNORMAL HIGH (ref 65–99)
Potassium: 4.9 mmol/L (ref 3.5–5.3)
Sodium: 138 mmol/L (ref 135–146)

## 2016-01-28 LAB — CBC
HCT: 43.7 % (ref 35.0–45.0)
Hemoglobin: 13.7 g/dL (ref 11.7–15.5)
MCH: 27.5 pg (ref 27.0–33.0)
MCHC: 31.4 g/dL — ABNORMAL LOW (ref 32.0–36.0)
MCV: 87.6 fL (ref 80.0–100.0)
MPV: 9.7 fL (ref 7.5–12.5)
Platelets: 360 10*3/uL (ref 140–400)
RBC: 4.99 MIL/uL (ref 3.80–5.10)
RDW: 16.1 % — ABNORMAL HIGH (ref 11.0–15.0)
WBC: 8.4 10*3/uL (ref 3.8–10.8)

## 2016-01-28 LAB — LIPID PANEL
Cholesterol: 124 mg/dL — ABNORMAL LOW (ref 125–200)
HDL: 27 mg/dL — ABNORMAL LOW (ref >=46)
LDL Cholesterol: 80 mg/dL (ref <130)
Total CHOL/HDL Ratio: 4.6 Ratio (ref <=5.0)
Triglycerides: 86 mg/dL (ref <150)
VLDL: 17 mg/dL (ref <30)

## 2016-01-28 LAB — COMPLETE METABOLIC PANEL WITHOUT GFR
ALT: 12 U/L (ref 6–29)
AST: 14 U/L (ref 10–30)
BUN: 9 mg/dL (ref 7–25)
Calcium: 8.8 mg/dL (ref 8.6–10.2)
Creat: 0.9 mg/dL (ref 0.50–1.10)
GFR, Est African American: >89 mL/min (ref >=60)
Total Bilirubin: 0.4 mg/dL (ref 0.2–1.2)
Total Protein: 6.6 g/dL (ref 6.1–8.1)

## 2016-01-28 LAB — POCT GLYCOSYLATED HEMOGLOBIN (HGB A1C): Hemoglobin A1C: 5.9

## 2016-01-28 MED ORDER — FUROSEMIDE 20 MG PO TABS: 20.0000 mg | ORAL_TABLET | Freq: Every day | ORAL | 0 refills | Status: DC

## 2016-01-28 MED ORDER — LOSARTAN POTASSIUM-HCTZ 100-12.5 MG PO TABS: 1.0000 | ORAL_TABLET | Freq: Every day | ORAL | 0 refills | Status: DC

## 2016-01-28 MED ORDER — CLOTRIMAZOLE 1 % EX CREA: TOPICAL_CREAM | CUTANEOUS | 0 refills | Status: DC

## 2016-01-28 MED ORDER — METFORMIN HCL 500 MG PO TABS: 500.0000 mg | ORAL_TABLET | Freq: Two times a day (BID) | ORAL | 0 refills | Status: DC

## 2016-01-28 MED ORDER — FUROSEMIDE 40 MG PO TABS: 40.0000 mg | ORAL_TABLET | Freq: Every day | ORAL | 3 refills | Status: DC

## 2016-01-28 NOTE — Progress Notes (Signed)
Alexa [name]  MRN: [id number] DOB: May 09, 1975  PCP: Benito Mccreedy, MD  Subjective:  Pt is a 40 year old female with a history of morbid obesity, hypertension, diabetes, iron deficiency anemia, who presents to clinic for multiple complaints.   Hypertension - Takes Losartan 100 mg. She has been out of her medication for 10 days. States it is normally controlled. Denies chest pain, SOB, palpitations, dizziness, headaches.   Diabetes - Metoformin 500 mg. Did not take this moring. Does not check home sugars. Cuts back on portion sizes, does not eat after 8 or 9 at night. Walks every day 10-20 minutes. Is interested in seeing a diabetic nutritionist. Not UTD on diabetic foot exam.   Irregular periods x two years - Menstrual bleeding lasts one week, two weeks, or even one month. She reports "regular cramping" No cramps last month. Sometimes it's heavy, sometimes it's not. Denies lightheadedness, dizziness, syncope.  Does not have GYN. Last PAP Used to go to [facility] extremity edema - Lasix, not well controlled. Does not have a cardiologist.  History of iron deficiency anemia  - Does not take medications for this.   Smoker x 15 years.   Review of Systems  Constitutional: Negative for activity change, appetite change, diaphoresis and unexpected weight change.  Respiratory: Negative for cough, chest tightness, shortness of breath and wheezing.   Cardiovascular: Positive for leg swelling. Negative for chest pain and palpitations.  Gastrointestinal: Negative for abdominal pain, diarrhea, nausea and vomiting.  Endocrine: Negative for polydipsia, polyphagia and polyuria.  Genitourinary: Negative for difficulty urinating, dyspareunia, dysuria, enuresis and urgency.  Skin: Negative.   Neurological: Negative for dizziness, weakness, light-headedness, numbness and headaches.    Patient Active Problem List   Diagnosis Date Noted  . Athlete's foot 10/08/2015  .  Diabetes ([place]) 06/22/2015  . Benign essential HTN 06/22/2015  . 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 BMI 70 and over, adult ([place]) 06/22/2015  . Vitamin D deficiency 06/22/2015  . Iron deficiency anemia 06/22/2015    Current Outpatient Prescriptions on File Prior to Visit  Medication Sig Dispense Refill  . clotrimazole (LOTRIMIN) 1 % cream Apply to affected area 2 times daily 15 g 0  . furosemide (LASIX) 20 MG tablet Take 1 tablet (20 mg total) by mouth daily. 90 tablet 0  . IRON PO Take by mouth.    . losartan-hydrochlorothiazide (HYZAAR) 100-12.5 MG tablet Take 1 tablet by mouth daily. 90 tablet 0  . metFORMIN (GLUCOPHAGE) 500 MG tablet Take 1 tablet (500 mg total) by mouth 2 (two) times daily with a meal. 180 tablet 0  . rivaroxaban (XARELTO) 20 MG TABS tablet Take 1 tablet (20 mg total) by mouth daily with supper. 90 tablet 0   No current facility-administered medications on file prior to visit.     Allergies  Allergen Reactions  . Penicillins Hives    Has patient had a PCN reaction causing immediate rash, facial/tongue/throat swelling, SOB or lightheadedness with hypotension: No Has patient had a PCN reaction causing severe rash involving mucus membranes or skin necrosis: No Has patient had a PCN reaction that required hospitalization No Has patient had a PCN reaction occurring within the last 10 years: No If all of the above answers are "NO", then may proceed with Cephalosporin use.    Objective:  BP (!) 162/90   Pulse 98   Temp 98.2 F (36.8 C) (Oral)   Resp 18   Ht 5\' 4"  (1.626  m)   Wt (!) 437 lb (198.2 kg)   LMP 01/22/2016   SpO2 97%   BMI 75.01 kg/m   Physical Exam  Constitutional: She is oriented to person, place, and time and well-developed, well-nourished, and in no distress. She appears unhealthy. No distress.  Morbidly obese   Cardiovascular: Normal rate, regular rhythm and normal heart sounds.   Neurological: She is alert and oriented to  person, place, and time. GCS score is 15.  Skin: Skin is warm and dry.  Psychiatric: Mood, memory, affect and judgment normal.  Vitals reviewed.   Assessment and Plan :  1. Encounter for medication counseling 2. Benign essential HTN 3. Type 2 diabetes mellitus with foot ulcer, without long-term current use of insulin ([place]) 4. BMI 70 and over, adult ([place]) - POCT glycosylated hemoglobin (Hb A1C) - COMPLETE METABOLIC PANEL WITH GFR - Microalbumin, urine - CBC - Lipid panel - Ambulatory referral to diabetic education - furosemide (LASIX) 20 MG tablet; Take 1 tablet (20 mg total) by mouth daily.  Dispense: 90 tablet; Refill: 0 - metFORMIN (GLUCOPHAGE) 500 MG tablet; Take 1 tablet (500 mg total) by mouth 2 (two) times daily with a meal.  Dispense: 180 tablet; Refill: 0 - losartan-hydrochlorothiazide (HYZAAR) 100-12.5 MG tablet; Take 1 tablet by mouth daily.  Dispense: 90 tablet; Refill: 0  5. Tinea pedis of left foot - clotrimazole (LOTRIMIN) 1 % cream; Apply to affected area 2 times daily  Dispense: 15 g; Refill: 0 6. Irregular periods/menstrual cycles 7. History of iron deficiency anemia - Likely due to 2 years of abnormal periods. Concern for possible endometriosis. Will refer for imaging.  - US Transvaginal Non-OB; Future  8. Edema, unspecified type - furosemide (LASIX) 40 MG tablet; Take 1 tablet (40 mg total) by mouth daily.  Dispense: 30 tablet; Refill: 3     Mercer Pod, PA-C  [facility] 01/28/2016 4:11 PM

## 2016-01-28 NOTE — Patient Instructions (Addendum)
Your blood pressure was elevated today, I presume this is due to you being off your medication for 10 days. Please take as directed and follow-up sooner if you seem to be running out.  Please schedule an annual exam soon. Please see the below information for general health maintenance.  Continue to work hard on diet and exercise. Diabetic nutrition will call you to schedule an appointment. [facility] will call to schedule an appointment so we can assess your irregular periods.   Thank you for coming in today. I hope you feel we met your needs.  Feel free to call [facility] if you have any questions or further requests.  Please consider signing up for MyChart if you do not already have it, as this is a great way to communicate with me.  Best,  Whitney McVey, PA-C   IF you received an x-ray today, you will receive an invoice from [facility]. Please contact [facility] at [phone] with questions or concerns regarding your invoice.   IF you received labwork today, you will receive an invoice from [employer]. Please contact [facility] at [phone] with questions or concerns regarding your invoice.   Our billing staff will not be able to assist you with questions regarding bills from these companies.  You will be contacted with the lab results as soon as they are available. The fastest way to get your results is to activate your My Chart account. Instructions are located on the last page of this paperwork. If you have not heard from [place] regarding the results in 2 weeks, please contact this office.    Health Maintenance, Female Adopting a healthy lifestyle and getting preventive care can go a long way to promote health and wellness. Talk with your health care provider about what schedule of regular examinations is right for you. This is a good chance for you to check in with your provider about disease prevention and staying healthy. In between  checkups, there are plenty of things you can do on your own. Experts have done a lot of research about which lifestyle changes and preventive measures are most likely to keep you healthy. Ask your health care provider for more information. WEIGHT AND DIET  Eat a healthy diet  Be sure to include plenty of vegetables, fruits, low-fat dairy products, and lean protein.  Do not eat a lot of foods high in solid fats, added sugars, or salt.  Get regular exercise. This is one of the most important things you can do for your health.  Most adults should exercise for at least 150 minutes each week. The exercise should increase your heart rate and make you sweat (moderate-intensity exercise).  Most adults should also do strengthening exercises at least twice a week. This is in addition to the moderate-intensity exercise.  Maintain a healthy weight  Body mass index (BMI) is a measurement that can be used to identify possible weight problems. It estimates body fat based on height and weight. Your health care provider can help determine your BMI and help you achieve or maintain a healthy weight.  For females 48 years of age and older:   A BMI below 18.5 is considered underweight.  A BMI of 18.5 to 24.9 is normal.  A BMI of 25 to 29.9 is considered overweight.  A BMI of 30 and above is considered obese.  Watch levels of cholesterol and blood lipids  You should start having your blood tested for lipids and cholesterol at 38  years of age, then have this test every 5 years.  You may need to have your cholesterol levels checked more often if:  Your lipid or cholesterol levels are high.  You are older than 38 years of age.  You are at high risk for heart disease.  CANCER SCREENING   Lung Cancer  Lung cancer screening is recommended for adults 4-70 years old who are at high risk for lung cancer because of a history of smoking.  A yearly low-dose CT scan of the lungs is recommended for  people who:  Currently smoke.  Have quit within the past 15 years.  Have at least a 30-pack-year history of smoking. A pack year is smoking an average of one pack of cigarettes a day for 1 year.  Yearly screening should continue until it has been 15 years since you quit.  Yearly screening should stop if you develop a health problem that would prevent you from having lung cancer treatment.  Breast Cancer  Practice breast self-awareness. This means understanding how your breasts normally appear and feel.  It also means doing regular breast self-exams. Let your health care provider know about any changes, no matter how small.  If you are in your 20s or 30s, you should have a clinical breast exam (CBE) by a health care provider every 1-3 years as part of a regular health exam.  If you are 82 or older, have a CBE every year. Also consider having a breast X-ray (mammogram) every year.  If you have a family history of breast cancer, talk to your health care provider about genetic screening.  If you are at high risk for breast cancer, talk to your health care provider about having an MRI and a mammogram every year.  Breast cancer gene (BRCA) assessment is recommended for women who have family members with BRCA-related cancers. BRCA-related cancers include:  Breast.  Ovarian.  Tubal.  Peritoneal cancers.  Results of the assessment will determine the need for genetic counseling and BRCA1 and BRCA2 testing. Cervical Cancer Your health care provider may recommend that you be screened regularly for cancer of the pelvic organs (ovaries, uterus, and vagina). This screening involves a pelvic examination, including checking for microscopic changes to the surface of your cervix (Pap test). You may be encouraged to have this screening done every 3 years, beginning at age 44.  For women ages 67-65, health care providers may recommend pelvic exams and Pap testing every 3 years, or they may  recommend the Pap and pelvic exam, combined with testing for human papilloma virus (HPV), every 5 years. Some types of HPV increase your risk of cervical cancer. Testing for HPV may also be done on women of any age with unclear Pap test results.  Other health care providers may not recommend any screening for nonpregnant women who are considered low risk for pelvic cancer and who do not have symptoms. Ask your health care provider if a screening pelvic exam is right for you.  If you have had past treatment for cervical cancer or a condition that could lead to cancer, you need Pap tests and screening for cancer for at least 20 years after your treatment. If Pap tests have been discontinued, your risk factors (such as having a new sexual partner) need to be reassessed to determine if screening should resume. Some women have medical problems that increase the chance of getting cervical cancer. In these cases, your health care provider may recommend more frequent screening and Pap  tests. Colorectal Cancer  This type of cancer can be detected and often prevented.  Routine colorectal cancer screening usually begins at 38 years of age and continues through 38 years of age.  Your health care provider may recommend screening at an earlier age if you have risk factors for colon cancer.  Your health care provider may also recommend using home test kits to check for hidden blood in the stool.  A small camera at the end of a tube can be used to examine your colon directly (sigmoidoscopy or colonoscopy). This is done to check for the earliest forms of colorectal cancer.  Routine screening usually begins at age 51.  Direct examination of the colon should be repeated every 5-10 years through 38 years of age. However, you may need to be screened more often if early forms of precancerous polyps or small growths are found. Skin Cancer  Check your skin from head to toe regularly.  Tell your health care provider  about any new moles or changes in moles, especially if there is a change in a mole's shape or color.  Also tell your health care provider if you have a mole that is larger than the size of a pencil eraser.  Always use sunscreen. Apply sunscreen liberally and repeatedly throughout the day.  Protect yourself by wearing long sleeves, pants, a wide-brimmed hat, and sunglasses whenever you are outside. HEART DISEASE, DIABETES, AND HIGH BLOOD PRESSURE   High blood pressure causes heart disease and increases the risk of stroke. High blood pressure is more likely to develop in:  People who have blood pressure in the high end of the normal range (130-139/85-89 mm Hg).  People who are overweight or obese.  People who are African American.  If you are 61-44 years of age, have your blood pressure checked every 3-5 years. If you are 72 years of age or older, have your blood pressure checked every year. You should have your blood pressure measured twice--once when you are at a hospital or clinic, and once when you are not at a hospital or clinic. Record the average of the two measurements. To check your blood pressure when you are not at a hospital or clinic, you can use:  An automated blood pressure machine at a pharmacy.  A home blood pressure monitor.  If you are between 36 years and 44 years old, ask your health care provider if you should take aspirin to prevent strokes.  Have regular diabetes screenings. This involves taking a blood sample to check your fasting blood sugar level.  If you are at a normal weight and have a low risk for diabetes, have this test once every three years after 38 years of age.  If you are overweight and have a high risk for diabetes, consider being tested at a younger age or more often. PREVENTING INFECTION  Hepatitis B  If you have a higher risk for hepatitis B, you should be screened for this virus. You are considered at high risk for hepatitis B if:  You were  born in a country where hepatitis B is common. Ask your health care provider which countries are considered high risk.  Your parents were born in a high-risk country, and you have not been immunized against hepatitis B (hepatitis B vaccine).  You have HIV or AIDS.  You use needles to inject street drugs.  You live with someone who has hepatitis B.  You have had sex with someone who has hepatitis B.  You get hemodialysis treatment.  You take certain medicines for conditions, including cancer, organ transplantation, and autoimmune conditions. Hepatitis C  Blood testing is recommended for:  Everyone born from [birth year] through 1963.  Anyone with known risk factors for hepatitis C. Sexually transmitted infections (STIs)  You should be screened for sexually transmitted infections (STIs) including gonorrhea and chlamydia if:  You are sexually active and are younger than 38 years of age.  You are older than 38 years of age and your health care provider tells you that you are at risk for this type of infection.  Your sexual activity has changed since you were last screened and you are at an increased risk for chlamydia or gonorrhea. Ask your health care provider if you are at risk.  If you do not have HIV, but are at risk, it may be recommended that you take a prescription medicine daily to prevent HIV infection. This is called pre-exposure prophylaxis (PrEP). You are considered at risk if:  You are sexually active and do not regularly use condoms or know the HIV status of your partner(s).  You take drugs by injection.  You are sexually active with a partner who has HIV. Talk with your health care provider about whether you are at high risk of being infected with HIV. If you choose to begin PrEP, you should first be tested for HIV. You should then be tested every 3 months for as long as you are taking PrEP.  PREGNANCY   If you are premenopausal and you may become pregnant, ask your  health care provider about preconception counseling.  If you may become pregnant, take 400 to 800 micrograms (mcg) of folic acid every day.  If you want to prevent pregnancy, talk to your health care provider about birth control (contraception). OSTEOPOROSIS AND MENOPAUSE   Osteoporosis is a disease in which the bones lose minerals and strength with aging. This can result in serious bone fractures. Your risk for osteoporosis can be identified using a bone density scan.  If you are 46 years of age or older, or if you are at risk for osteoporosis and fractures, ask your health care provider if you should be screened.  Ask your health care provider whether you should take a calcium or vitamin D supplement to lower your risk for osteoporosis.  Menopause may have certain physical symptoms and risks.  Hormone replacement therapy may reduce some of these symptoms and risks. Talk to your health care provider about whether hormone replacement therapy is right for you.  HOME CARE INSTRUCTIONS   Schedule regular health, dental, and eye exams.  Stay current with your immunizations.   Do not use any tobacco products including cigarettes, chewing tobacco, or electronic cigarettes.  If you are pregnant, do not drink alcohol.  If you are breastfeeding, limit how much and how often you drink alcohol.  Limit alcohol intake to no more than 1 drink per day for nonpregnant women. One drink equals 12 ounces of beer, 5 ounces of wine, or 1 ounces of hard liquor.  Do not use street drugs.  Do not share needles.  Ask your health care provider for help if you need support or information about quitting drugs.  Tell your health care provider if you often feel depressed.  Tell your health care provider if you have ever been abused or do not feel safe at home.   This information is not intended to replace advice given to you by your health care provider.  Make sure you discuss any questions you have with  your health care provider.   Document Released: 09/25/2010 Document Revised: 04/02/2014 Document Reviewed: 02/11/2013 Elsevier Interactive Patient Education [employer].

## 2016-01-29 LAB — MICROALBUMIN, URINE: Microalb, Ur: 1.4 mg/dL

## 2016-01-30 ENCOUNTER — Other Ambulatory Visit (HOSPITAL_COMMUNITY): Payer: 59

## 2016-01-30 ENCOUNTER — Telehealth: Payer: Self-pay

## 2016-01-30 DIAGNOSIS — N926 Irregular menstruation, unspecified: Secondary | ICD-10-CM

## 2016-01-30 NOTE — Telephone Encounter (Signed)
THIS MESSAGE IS FROM KATHY AT [facility]: Alexa [name] NEEDS TO PUT IN EPIC AND SIGN FOR PATIENT TO BE SCHEDULED FOR A ULTRASOUND-COMPLETE PELVIS. SHE CAN NOT BE SCHEDULED UNTIL THIS IS DONE. BEST PHONE [phone] ([place]) [place]

## 2016-01-30 NOTE — Telephone Encounter (Signed)
Please change Whitney  Ty

## 2016-01-31 NOTE — Telephone Encounter (Signed)
Done. Thank you.

## 2016-02-02 NOTE — Telephone Encounter (Signed)
THIS MESSAGE IS TO ELIZABETH MCVEY FROM KATHY AT [facility]: THE TRANSVAGINAL IS O.K. BUT SHE NEEDS TO HAVE PUT IN EPIC AND SIGNED THE ULTRASOUND-COMPLETE PELVIS. IT STILL HAS NOT BEEN DONE. IF QUESTIONS PLEASE CALL KATHY AT [place] [phone].  [place]

## 2016-02-02 NOTE — Addendum Note (Signed)
Addended by: Dorise Hiss on: 02/02/2016 03:16 PM   Modules accepted: Orders

## 2016-02-03 NOTE — Telephone Encounter (Signed)
I'm sorry. I misunderstood and put the wrong order in yesterday. Did it now. Please let me know if it's right or needs changing. Thank you!

## 2016-02-07 ENCOUNTER — Other Ambulatory Visit: Payer: Self-pay

## 2016-02-07 ENCOUNTER — Telehealth: Payer: Self-pay

## 2016-02-07 DIAGNOSIS — N926 Irregular menstruation, unspecified: Secondary | ICD-10-CM

## 2016-02-07 NOTE — Telephone Encounter (Signed)
[facility] called -- we need to add US transvaginal before other orders can be schd. Thanks!

## 2016-02-07 NOTE — Telephone Encounter (Signed)
Looks like Alexa [name] added today.

## 2016-02-10 ENCOUNTER — Encounter: Payer: Self-pay | Admitting: Physician Assistant

## 2016-02-10 MED ORDER — ATORVASTATIN CALCIUM 40 MG PO TABS: 40.0000 mg | ORAL_TABLET | Freq: Every day | ORAL | 2 refills | Status: DC

## 2016-02-10 NOTE — Addendum Note (Signed)
Addended by: Dorise Hiss on: 02/10/2016 12:08 AM   Modules accepted: Orders

## 2016-02-10 NOTE — Progress Notes (Signed)
Will start statin therapy with Lisinopril 40mg  qd to help lower heart disease and stroke risk. RTC in 3 months for f/u.

## 2016-02-10 NOTE — Progress Notes (Signed)
Please call patient and let her know her cholesterol is elevated. Plan to start statin therapy. She can pick it up at her pharmacy. I will write her a detailed letter with lab results. Thank you!

## 2016-02-15 ENCOUNTER — Other Ambulatory Visit (HOSPITAL_COMMUNITY): Payer: 59

## 2016-02-22 ENCOUNTER — Telehealth: Payer: Self-pay

## 2016-02-22 ENCOUNTER — Ambulatory Visit
Admission: RE | Admit: 2016-02-22 | Discharge: 2016-02-22 | Disposition: A | Discharge status: Home / Self Care | Payer: BLUE CROSS/BLUE SHIELD | Source: Ambulatory Visit | Attending: Physician Assistant | Admitting: Physician Assistant

## 2016-02-22 DIAGNOSIS — N926 Irregular menstruation, unspecified: Secondary | ICD-10-CM

## 2016-02-22 NOTE — Telephone Encounter (Signed)
Keila from [facility] called and stated that patient is in their office for an US abdomen complete and it is ordered for irregular periods.  This is not covered as a diagnosis.  The transvaginal OB US and the pelvic US have already been done. I discussed with Ivar Drape PAC as Whitney McVey PAC was not in office and she said that it is okay to cancel the abdominal US after reviewing patient's notes.  I reported this to [facility] and she said she will cancel the order.

## 2016-02-23 ENCOUNTER — Encounter: Payer: Self-pay | Admitting: Physician Assistant

## 2016-02-23 NOTE — Progress Notes (Signed)
TV US shows normal uterus. No sign of endometriosis.

## 2016-02-24 ENCOUNTER — Encounter: Payer: Self-pay | Admitting: Physician Assistant

## 2016-03-24 ENCOUNTER — Ambulatory Visit (INDEPENDENT_AMBULATORY_CARE_PROVIDER_SITE_OTHER): Payer: BLUE CROSS/BLUE SHIELD | Admitting: Family Medicine

## 2016-03-24 VITALS — BP 128/70 | HR 107 | Temp 98.7°F | Resp 18 | Ht 64.0 in | Wt >= 6400 oz

## 2016-03-24 DIAGNOSIS — L03119 Cellulitis of unspecified part of limb: Secondary | ICD-10-CM

## 2016-03-24 DIAGNOSIS — Z23 Encounter for immunization: Secondary | ICD-10-CM | POA: Diagnosis not present

## 2016-03-24 DIAGNOSIS — Z6841 Body Mass Index (BMI) 40.0 and over, adult: Secondary | ICD-10-CM

## 2016-03-24 DIAGNOSIS — L304 Erythema intertrigo: Secondary | ICD-10-CM | POA: Diagnosis not present

## 2016-03-24 DIAGNOSIS — L02419 Cutaneous abscess of limb, unspecified: Secondary | ICD-10-CM | POA: Diagnosis not present

## 2016-03-24 MED ORDER — NYSTATIN 100000 UNIT/GM EX POWD: Freq: Four times a day (QID) | CUTANEOUS | 1 refills | Status: DC

## 2016-03-24 MED ORDER — CEPHALEXIN 500 MG PO CAPS: 500.0000 mg | ORAL_CAPSULE | Freq: Four times a day (QID) | ORAL | 0 refills | Status: DC

## 2016-03-24 NOTE — Patient Instructions (Addendum)
   IF you received an x-ray today, you will receive an invoice from [facility]. Please contact [facility] at [phone] with questions or concerns regarding your invoice.   IF you received labwork today, you will receive an invoice from [facility]. Please contact [facility] at [phone] with questions or concerns regarding your invoice.   Our billing staff will not be able to assist you with questions regarding bills from these companies.  You will be contacted with the lab results as soon as they are available. The fastest way to get your results is to activate your My Chart account. Instructions are located on the last page of this paperwork. If you have not heard from us regarding the results in 2 weeks, please contact this office.     Cellulitis, Adult Cellulitis is a skin infection. The infected area is usually red and tender. This condition occurs most often in the arms and lower legs. The infection can travel to the muscles, blood, and underlying tissue and become serious. It is very important to get treated for this condition. What are the causes? Cellulitis is caused by bacteria. The bacteria enter through a break in the skin, such as a cut, burn, insect bite, open sore, or crack. What increases the risk? This condition is more likely to occur in people who:  Have a weak defense system (immune system).  Have open wounds on the skin such as cuts, burns, bites, and scrapes. Bacteria can enter the body through these open wounds.  Are older.  Have diabetes.  Have a type of long-lasting (chronic) liver disease (cirrhosis) or kidney disease.  Use IV drugs. What are the signs or symptoms? Symptoms of this condition include:  Redness, streaking, or spotting on the skin.  Swollen area of the skin.  Tenderness or pain when an area of the skin is touched.  Warm skin.  Fever.  Chills.  Blisters. How is this diagnosed? This condition is diagnosed  based on a medical history and physical exam. You may also have tests, including:  Blood tests.  Lab tests.  Imaging tests. How is this treated? Treatment for this condition may include:  Medicines, such as antibiotic medicines or antihistamines.  Supportive care, such as rest and application of cold or warm cloths (cold or warm compresses) to the skin.  Hospital care, if the condition is severe. The infection usually gets better within 1-2 days of treatment. Follow these instructions at home:  Take over-the-counter and prescription medicines only as told by your health care provider.  If you were prescribed an antibiotic medicine, take it as told by your health care provider. Do not stop taking the antibiotic even if you start to feel better.  Drink enough fluid to keep your urine clear or pale yellow.  Do not touch or rub the infected area.  Raise (elevate) the infected area above the level of your heart while you are sitting or lying down.  Apply warm or cold compresses to the affected area as told by your health care provider.  Keep all follow-up visits as told by your health care provider. This is important. These visits let your health care provider make sure a more serious infection is not developing. Contact a health care provider if:  You have a fever.  Your symptoms do not improve within 1-2 days of starting treatment.  Your bone or joint underneath the infected area becomes painful after the skin has healed.  Your infection returns in the same area   or another area.  You notice a swollen bump in the infected area.  You develop new symptoms.  You have a general ill feeling (malaise) with muscle aches and pains. Get help right away if:  Your symptoms get worse.  You feel very sleepy.  You develop vomiting or diarrhea that persists.  You notice red streaks coming from the infected area.  Your red area gets larger or turns dark in color. This information  is not intended to replace advice given to you by your health care provider. Make sure you discuss any questions you have with your health care provider. Document Released: 12/20/2004 Document Revised: 07/21/2015 Document Reviewed: 01/19/2015 Elsevier Interactive Patient Education  2017 [employer] Inc[employer]  

## 2016-03-24 NOTE — Progress Notes (Signed)
Subjective:  By signing my name below, I, Alexa [name], attest that this documentation has been prepared under the direction and in the presence of Alexa Cheadle, MD. Electronically Signed: Moises [name], [place]. 03/24/2016 , 3:40 PM .  Patient was seen in Room 12 .   Patient ID: Alexa [name], female    DOB: [birth year], 40 y.o.   MRN: [id number] Chief Complaint  Patient presents with  . Leg Swelling    left thigh started Wednesday    HPI Alexa [name] is a 40 y.o. female who presents to [facility] complaining of left leg swelling in her upper leg that started 3~4 days ago. She reports the area feeling hot and tingly. She thought she sprained her leg. She denies knowledge of bug bite, boil, or skin infection prior. She has history of peripheral edema.   Past Medical History:  Diagnosis Date  . Gestational diabetes   . Hypertension    Prior to Admission medications   Medication Sig Start Date End Date Taking? Authorizing Provider  atorvastatin (LIPITOR) 40 MG tablet Take 1 tablet (40 mg total) by mouth daily. 02/10/16  Yes Elizabeth Whitney McVey, PA-C  clotrimazole (LOTRIMIN) 1 % cream Apply to affected area 2 times daily 01/28/16  Yes [employer] McVey, PA-C  furosemide (LASIX) 20 MG tablet Take 1 tablet (20 mg total) by mouth daily. 01/28/16  Yes Elizabeth Whitney McVey, PA-C  furosemide (LASIX) 40 MG tablet Take 1 tablet (40 mg total) by mouth daily. 01/28/16  Yes Elizabeth Whitney McVey, PA-C  IRON PO Take by mouth.   Yes Historical Provider, MD  losartan-hydrochlorothiazide (HYZAAR) 100-12.5 MG tablet Take 1 tablet by mouth daily. 01/28/16  Yes Elizabeth Whitney McVey, PA-C  metFORMIN (GLUCOPHAGE) 500 MG tablet Take 1 tablet (500 mg total) by mouth 2 (two) times daily with a meal. 01/28/16  Yes Gelene Mink McVey, PA-C  rivaroxaban (XARELTO) 20 MG TABS tablet Take 1 tablet (20 mg total) by mouth daily with supper. Patient not taking: Reported on 03/24/2016 10/08/15   Gregor Hams, MD   Allergies  Allergen Reactions  . Penicillins Hives    Has patient had a PCN reaction causing immediate rash, facial/tongue/throat swelling, SOB or lightheadedness with hypotension: No Has patient had a PCN reaction causing severe rash involving mucus membranes or skin necrosis: No Has patient had a PCN reaction that required hospitalization No Has patient had a PCN reaction occurring within the last 10 years: No If all of the above answers are "NO", then may proceed with Cephalosporin use.   Review of Systems  Constitutional: Negative for chills, fatigue, fever and unexpected weight change.  Respiratory: Negative for cough.   Cardiovascular: Positive for leg swelling.  Gastrointestinal: Negative for constipation, diarrhea, nausea and vomiting.  Skin: Negative for rash and wound.  Neurological: Negative for dizziness, weakness and headaches.       Objective:   Physical Exam  Constitutional: She is oriented to person, place, and time. She appears well-developed and well-nourished. No distress.  HENT:  Head: Normocephalic and atraumatic.  Eyes: EOM are normal. Pupils are equal, round, and reactive to light.  Neck: Neck supple.  Cardiovascular: Normal rate.   Pulmonary/Chest: Effort normal. No respiratory distress.  Musculoskeletal: Normal range of motion.  Neurological: She is alert and oriented to person, place, and time.  Skin: Skin is warm and dry.  There is some erythematous and indurated skin that is warm and tender on the medial aspect of her left inner  thigh surrounding the second skin fold; between the skin folds there is a lot of maceration and exudative tissue with smooth, shining skin with strong odor; there is no central area of fluctuance, or drainage  Psychiatric: She has a normal mood and affect. Her behavior is normal.  Nursing note and vitals reviewed.   BP 128/70 (BP Location: Right Arm, Patient Position: Sitting, Cuff Size: Large)   Pulse (!) 107    Temp 98.7 F (37.1 C) (Oral)   Resp 18   Ht 5\' 4"  (1.626 m)   Wt (!) 435 lb 6.4 oz (197.5 kg)   SpO2 90%   BMI 74.74 kg/m     Assessment & Plan:   1. Cellulitis and abscess of leg - cellulitis on the anterior medial aspect of left mid-thigh seems to be originating from macerated intertriginous area. Start Keflex 500 qid x 1 wk. Area marked with skin script marker - RTC immed if extends outside. Apply heat sev times/d but make sure to dry well after Intertriginous dermatitis associated with moisture - keep areas clean and very dry, try nystatin powder or otc powders if nystatin is costly  2. Need for prophylactic vaccination and inoculation against influenza   3. BMI 70 and over, adult [facility])      Orders Placed This Encounter  Procedures  . Flu Vaccine QUAD 36+ mos PF IM (Fluarix & Fluzone Quad PF)    Meds ordered this encounter  Medications  . cephALEXin (KEFLEX) 500 MG capsule    Sig: Take 1 capsule (500 mg total) by mouth 4 (four) times daily.    Dispense:  28 capsule    Refill:  0  . nystatin (MYCOSTATIN/NYSTOP) powder    Sig: Apply topically 4 (four) times daily.    Dispense:  60 g    Refill:  1    I personally performed the services described in this documentation, which was scribed in my presence. The recorded information has been reviewed and considered, and addended by me as needed.   Alexa [name], M.D.  [facility] [address] [place], [place] [address] [phone] phone [phone] fax  03/25/16 10:28 PM

## 2016-03-30 ENCOUNTER — Telehealth: Payer: Self-pay

## 2016-03-30 NOTE — Telephone Encounter (Signed)
Pt was seen on 03/24/16 by Dr. Brigitte Pulse for Cellulitis and abscess of leg +3 more. The swelling has not gone down. She would like a refill on her meds from that day or if she needs to come in, she will. Please advise at [phone]

## 2016-03-30 NOTE — Telephone Encounter (Signed)
Needs to be seen

## 2016-03-31 ENCOUNTER — Ambulatory Visit (INDEPENDENT_AMBULATORY_CARE_PROVIDER_SITE_OTHER): Payer: BLUE CROSS/BLUE SHIELD | Admitting: Family Medicine

## 2016-03-31 VITALS — BP 126/80 | HR 108 | Temp 98.6°F | Resp 18 | Ht 64.0 in | Wt >= 6400 oz

## 2016-03-31 DIAGNOSIS — M545 Low back pain, unspecified: Secondary | ICD-10-CM

## 2016-03-31 DIAGNOSIS — IMO0001 Reserved for inherently not codable concepts without codable children: Secondary | ICD-10-CM

## 2016-03-31 DIAGNOSIS — L03119 Cellulitis of unspecified part of limb: Secondary | ICD-10-CM | POA: Diagnosis not present

## 2016-03-31 DIAGNOSIS — Z6841 Body Mass Index (BMI) 40.0 and over, adult: Secondary | ICD-10-CM | POA: Diagnosis not present

## 2016-03-31 DIAGNOSIS — L02419 Cutaneous abscess of limb, unspecified: Secondary | ICD-10-CM

## 2016-03-31 DIAGNOSIS — E6609 Other obesity due to excess calories: Secondary | ICD-10-CM

## 2016-03-31 MED ORDER — DOXYCYCLINE HYCLATE 100 MG PO CAPS: 100.0000 mg | ORAL_CAPSULE | Freq: Two times a day (BID) | ORAL | 0 refills | Status: DC

## 2016-03-31 MED ORDER — CYCLOBENZAPRINE HCL 10 MG PO TABS: 10.0000 mg | ORAL_TABLET | Freq: Three times a day (TID) | ORAL | 0 refills | Status: DC | PRN

## 2016-03-31 NOTE — Patient Instructions (Addendum)
IF you received an x-ray today, you will receive an invoice from [facility]. Please contact [facility] at [phone] with questions or concerns regarding your invoice.   IF you received labwork today, you will receive an invoice from [place]. Please contact [facility] at [phone] with questions or concerns regarding your invoice.   Our billing staff will not be able to assist you with questions regarding bills from these companies.  You will be contacted with the lab results as soon as they are available. The fastest way to get your results is to activate your My Chart account. Instructions are located on the last page of this paperwork. If you have not heard from [place] regarding the results in 2 weeks, please contact this office.     Skin Abscess A skin abscess is an infected area on or under your skin that contains a collection of pus and other material. An abscess may also be called a furuncle, carbuncle, or boil. An abscess can occur in or on almost any part of your body. Some abscesses break open (rupture) on their own. Most continue to get worse unless they are treated. The infection can spread deeper into the body and eventually into your blood, which can make you feel ill. Treatment usually involves draining the abscess. What are the causes? An abscess occurs when germs, often bacteria, pass through your skin and cause an infection. This may be caused by:  A scrape or cut on your skin.  A puncture wound through your skin, including a needle injection.  Blocked oil or sweat glands.  Blocked and infected hair follicles.  A cyst that forms beneath your skin (sebaceous cyst) and becomes infected. What increases the risk? This condition is more likely to develop in people who:  Have a weak body defense system (immune system).  Have diabetes.  Have dry and irritated skin.  Get frequent injections or use illegal IV drugs.  Have a foreign body in a  wound, such as a splinter.  Have problems with their lymph system or veins. What are the signs or symptoms? An abscess may start as a painful, firm bump under the skin. Over time, the abscess may get larger or become softer. Pus may appear at the top of the abscess, causing pressure and pain. It may eventually break through the skin and drain. Other symptoms include:  Redness.  Warmth.  Swelling.  Tenderness.  A sore on the skin. How is this diagnosed? This condition is diagnosed based on your medical history and a physical exam. A sample of pus may be taken from the abscess to find out what is causing the infection and what antibiotics can be used to treat it. You also may have:  Blood tests to look for signs of infection or spread of an infection to your blood.  Imaging studies such as ultrasound, CT scan, or MRI if the abscess is deep. How is this treated? Small abscesses that drain on their own may not need treatment. Treatment for an abscess that does not rupture on its own may include:  Warm compresses applied to the area several times per day.  Incision and drainage. Your health care provider will make an incision to open the abscess and will remove pus and any foreign body or dead tissue. The incision area may be packed with gauze to keep it open for a few days while it heals.  Antibiotic medicines to treat infection. For a severe abscess, you may first  get antibiotics through an IV and then change to oral antibiotics. Follow these instructions at home: Abscess Care  If you have an abscess that has not drained, place a warm, clean, wet washcloth over the abscess several times a day. Do this as told by your health care provider.  Follow instructions from your health care provider about how to take care of your abscess. Make sure you:  Cover the abscess with a bandage (dressing).  Change your dressing or gauze as told by your health care provider.  Wash your hands with  soap and water before you change the dressing or gauze. If soap and water are not available, use hand sanitizer.  Check your abscess every day for signs of a worsening infection. Check for:  More redness, swelling, or pain.  More fluid or blood.  Warmth.  More pus or a bad smell. Medicines  Take over-the-counter and prescription medicines only as told by your health care provider.  If you were prescribed an antibiotic medicine, take it as told by your health care provider. Do not stop taking the antibiotic even if you start to feel better. General instructions  To avoid spreading the infection:  Do not share personal care items, towels, or hot tubs with others.  Avoid making skin contact with other people.  Keep all follow-up visits as told by your health care provider. This is important. Contact a health care provider if:  You have more redness, swelling, or pain around your abscess.  You have more fluid or blood coming from your abscess.  Your abscess feels warm to the touch.  You have more pus or a bad smell coming from your abscess.  You have a fever.  You have muscle aches.  You have chills or a general ill feeling. Get help right away if:  You have severe pain.  You see red streaks on your skin spreading away from the abscess. This information is not intended to replace advice given to you by your health care provider. Make sure you discuss any questions you have with your health care provider. Document Released: 12/20/2004 Document Revised: 11/06/2015 Document Reviewed: 01/19/2015 Elsevier Interactive Patient Education  2017 [employer] Inc.  Cellulitis, Adult Cellulitis is a skin infection. The infected area is usually red and tender. This condition occurs most often in the arms and lower legs. The infection can travel to the muscles, blood, and underlying tissue and become serious. It is very important to get treated for this condition. What are the  causes? Cellulitis is caused by bacteria. The bacteria enter through a break in the skin, such as a cut, burn, insect bite, open sore, or crack. What increases the risk? This condition is more likely to occur in people who:  Have a weak defense system (immune system).  Have open wounds on the skin such as cuts, burns, bites, and scrapes. Bacteria can enter the body through these open wounds.  Are older.  Have diabetes.  Have a type of long-lasting (chronic) liver disease (cirrhosis) or kidney disease.  Use IV drugs. What are the signs or symptoms? Symptoms of this condition include:  Redness, streaking, or spotting on the skin.  Swollen area of the skin.  Tenderness or pain when an area of the skin is touched.  Warm skin.  Fever.  Chills.  Blisters. How is this diagnosed? This condition is diagnosed based on a medical history and physical exam. You may also have tests, including:  Blood tests.  Lab tests.  Imaging tests. How is this treated? Treatment for this condition may include:  Medicines, such as antibiotic medicines or antihistamines.  Supportive care, such as rest and application of cold or warm cloths (cold or warm compresses) to the skin.  Hospital care, if the condition is severe. The infection usually gets better within 1-2 days of treatment. Follow these instructions at home:  Take over-the-counter and prescription medicines only as told by your health care provider.  If you were prescribed an antibiotic medicine, take it as told by your health care provider. Do not stop taking the antibiotic even if you start to feel better.  Drink enough fluid to keep your urine clear or pale yellow.  Do not touch or rub the infected area.  Raise (elevate) the infected area above the level of your heart while you are sitting or lying down.  Apply warm or cold compresses to the affected area as told by your health care provider.  Keep all follow-up visits  as told by your health care provider. This is important. These visits let your health care provider make sure a more serious infection is not developing. Contact a health care provider if:  You have a fever.  Your symptoms do not improve within 1-2 days of starting treatment.  Your bone or joint underneath the infected area becomes painful after the skin has healed.  Your infection returns in the same area or another area.  You notice a swollen bump in the infected area.  You develop new symptoms.  You have a general ill feeling (malaise) with muscle aches and pains. Get help right away if:  Your symptoms get worse.  You feel very sleepy.  You develop vomiting or diarrhea that persists.  You notice red streaks coming from the infected area.  Your red area gets larger or turns dark in color. This information is not intended to replace advice given to you by your health care provider. Make sure you discuss any questions you have with your health care provider. Document Released: 12/20/2004 Document Revised: 07/21/2015 Document Reviewed: 01/19/2015 Elsevier Interactive Patient Education  2017 [employer].

## 2016-03-31 NOTE — Telephone Encounter (Signed)
Pt advised and tx up front for an appt 

## 2016-03-31 NOTE — Progress Notes (Addendum)
Subjective:  By signing my name below, I, Alexa [name], attest that this documentation has been prepared under the direction and in the presence of Delman Cheadle, MD Electronically Signed: Ladene Artist, ED Scribe 03/31/2016 at 3:32 PM.   Patient ID: Alexa [name], female    DOB: [birth year], 41 y.o.   MRN: [id number]  Chief Complaint  Patient presents with  . Follow-up    leg  . Back Pain   HPI HPI Comments: Alexa [name] is a 41 y.o. female who presents to the [facility] for a follow-up on cellulitis and abscess on leg. Pt was started on Keflex for cellulitis of the thigh which she states improved but did not resolve.  Today, she reports that she is still experiencing some itching to the area but notes that pain has improved overall. 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   Back Pain Pt also presents with sudden onset of right low back pain onset earlier today. She states that she has been sleeping upright and jerked herself forward in her sleep which she suspects caused back pain. She has applied muscle rub to the area without significant relief.  Past Medical History:  Diagnosis Date  . Gestational diabetes   . Hypertension    Current Outpatient Prescriptions on File Prior to Visit  Medication Sig Dispense Refill  . atorvastatin (LIPITOR) 40 MG tablet Take 1 tablet (40 mg total) by mouth daily. 30 tablet 2  . clotrimazole (LOTRIMIN) 1 % cream Apply to affected area 2 times daily 15 g 0  . furosemide (LASIX) 20 MG tablet Take 1 tablet (20 mg total) by mouth daily. 90 tablet 0  . furosemide (LASIX) 40 MG tablet Take 1 tablet (40 mg total) by mouth daily. 30 tablet 3  . IRON PO Take by mouth.    . losartan-hydrochlorothiazide (HYZAAR) 100-12.5 MG tablet Take 1 tablet by mouth daily. 90 tablet 0  . metFORMIN (GLUCOPHAGE) 500 MG tablet Take 1 tablet (500 mg total) by mouth 2 (two) times daily  with a meal. 180 tablet 0  . nystatin (MYCOSTATIN/NYSTOP) powder Apply topically 4 (four) times daily. 60 g 1  . rivaroxaban (XARELTO) 20 MG TABS tablet Take 1 tablet (20 mg total) by mouth daily with supper. 90 tablet 0   No current facility-administered medications on file prior to visit.    Allergies  Allergen Reactions  . Penicillins Hives    Has patient had a PCN reaction causing immediate rash, facial/tongue/throat swelling, SOB or lightheadedness with hypotension: No Has patient had a PCN reaction causing severe rash involving mucus membranes or skin necrosis: No Has patient had a PCN reaction that required hospitalization No Has patient had a PCN reaction occurring within the last 10 years: No If all of the above answers are "NO", then may proceed with Cephalosporin use.   Review of Systems  Constitutional: Negative for activity change, appetite change, chills and fever.  Musculoskeletal: Positive for arthralgias, back pain, gait problem and myalgias.  Skin: Positive for color change and rash. Negative for wound.  Allergic/Immunologic: Negative for immunocompromised state.  Neurological: Negative for weakness and numbness.  Psychiatric/Behavioral: Positive for sleep disturbance.      Objective:   Physical Exam  Constitutional: She is oriented to person, place, and time. She appears well-developed and well-nourished. No distress.  HENT:  Head: Normocephalic and atraumatic.  Eyes: Conjunctivae and EOM  are normal.  Neck: Neck supple. No tracheal deviation present.  Cardiovascular: Normal rate.   Pulmonary/Chest: Effort normal. No respiratory distress.  Abdominal: There is no CVA tenderness.  Musculoskeletal: Normal range of motion.  Tenderness toward R lower buttock.  Neurological: She is alert and oriented to person, place, and time.  Skin: Skin is warm and dry. Rash noted.  Erythema and induration on anterior aspect of L upper thigh. Mild warmth and itching. No  tenderness. App palm sized, but there is a firmer serpiginous area of induration on the anterior of the aspect.  Back: Dryness centrally but no rashes.  Psychiatric: She has a normal mood and affect. Her behavior is normal.  Nursing note and vitals reviewed.  BP 126/80 (BP Location: Right Arm, Patient Position: Sitting, Cuff Size: Large)   Pulse (!) 108   Temp 98.6 F (37 C) (Oral)   Resp 18   Ht 5\' 4"  (1.626 m)   Wt (!) 443 lb (200.9 kg)   LMP 02/29/2016   SpO2 94%   BMI 76.04 kg/m     Assessment & Plan:   1. Cellulitis and abscess of leg   2. Acute right-sided low back pain without sciatica   3. Class 3 obesity due to excess calories with serious comorbidity and body mass index (BMI) greater than or equal to 70 in adult [facility])     Recheck in 2d, sooner if worse.  I think it is originating from the macerated area between skin folds on her left leg. Needs to keep clean and dry. Need to elevated leg and do dry warm compresses sev times/d.   Meds ordered this encounter  Medications  . doxycycline (VIBRAMYCIN) 100 MG capsule    Sig: Take 1 capsule (100 mg total) by mouth 2 (two) times daily.    Dispense:  20 capsule    Refill:  0  . cyclobenzaprine (FLEXERIL) 10 MG tablet    Sig: Take 1 tablet (10 mg total) by mouth 3 (three) times daily as needed for muscle spasms. Take before bed at night for back spasm pain    Dispense:  30 tablet    Refill:  0    I personally performed the services described in this documentation, which was scribed in my presence. The recorded information has been reviewed and considered, and addended by me as needed.   Delman Cheadle, M.D.  [facility] [address] [place], [place] [address] [phone] phone [phone] fax  04/15/16 4:22 AM

## 2016-04-04 ENCOUNTER — Telehealth: Payer: Self-pay

## 2016-04-04 NOTE — Telephone Encounter (Signed)
Pt states that she needs a note for work being out from Saturday til Wednesday and returning on Thursday  And to have possible light duty   Best number [phone]

## 2016-04-05 ENCOUNTER — Encounter: Payer: Self-pay | Admitting: Emergency Medicine

## 2016-04-05 NOTE — Telephone Encounter (Signed)
Letter printed and faxed to Mrs Delaney Meigs at [phone] on 04/05/16.

## 2016-04-05 NOTE — Telephone Encounter (Signed)
Yes, that fine. It was in a location that would have made all the recommended warm compresses and topical treatments very awkward. . . With possible lawsuits.  Fine for light duty for the next week if pt would like.

## 2016-04-05 NOTE — Telephone Encounter (Signed)
Spoke with patient and advised letter will be at front desk

## 2016-04-05 NOTE — Telephone Encounter (Signed)
Dr. Brigitte Pulse, I told the patient that she would need your approval for this many requested days out due to an abscess.  Also requesting light duty restrictions for work  Please advise

## 2016-04-06 ENCOUNTER — Telehealth: Payer: Self-pay

## 2016-04-06 NOTE — Telephone Encounter (Signed)
Pt is checking on status as to why work note has not been faxed to her employer yet  Best number [phone]

## 2016-04-08 NOTE — Telephone Encounter (Signed)
See prior note, it was faxed 04/05/16

## 2016-04-15 DIAGNOSIS — IMO0001 Reserved for inherently not codable concepts without codable children: Secondary | ICD-10-CM | POA: Insufficient documentation

## 2016-04-23 ENCOUNTER — Ambulatory Visit (INDEPENDENT_AMBULATORY_CARE_PROVIDER_SITE_OTHER): Payer: BLUE CROSS/BLUE SHIELD | Admitting: Emergency Medicine

## 2016-04-23 VITALS — BP 152/98 | HR 98 | Temp 98.6°F | Resp 22 | Ht 64.0 in | Wt >= 6400 oz

## 2016-04-23 DIAGNOSIS — Z6841 Body Mass Index (BMI) 40.0 and over, adult: Secondary | ICD-10-CM | POA: Diagnosis not present

## 2016-04-23 DIAGNOSIS — Z7189 Other specified counseling: Secondary | ICD-10-CM

## 2016-04-23 DIAGNOSIS — R609 Edema, unspecified: Secondary | ICD-10-CM

## 2016-04-23 DIAGNOSIS — D1724 Benign lipomatous neoplasm of skin and subcutaneous tissue of left leg: Secondary | ICD-10-CM

## 2016-04-23 MED ORDER — METFORMIN HCL 500 MG PO TABS: 500.0000 mg | ORAL_TABLET | Freq: Two times a day (BID) | ORAL | 3 refills | Status: DC

## 2016-04-23 MED ORDER — LOSARTAN POTASSIUM-HCTZ 100-12.5 MG PO TABS: 1.0000 | ORAL_TABLET | Freq: Every day | ORAL | 3 refills | Status: DC

## 2016-04-23 NOTE — Patient Instructions (Addendum)
IF you received an x-ray today, you will receive an invoice from [facility]. Please contact [facility] at [phone] with questions or concerns regarding your invoice.   IF you received labwork today, you will receive an invoice from [facility]. Please contact [facility] at [phone] with questions or concerns regarding your invoice.   Our billing staff will not be able to assist you with questions regarding bills from these companies.  You will be contacted with the lab results as soon as they are available. The fastest way to get your results is to activate your My Chart account. Instructions are located on the last page of this paperwork. If you have not heard from [place] regarding the results in 2 weeks, please contact this office.        Calorie Counting for Weight Loss Calories are energy you get from the things you eat and drink. Your body uses this energy to keep you going throughout the day. The number of calories you eat affects your weight. When you eat more calories than your body needs, your body stores the extra calories as fat. When you eat fewer calories than your body needs, your body burns fat to get the energy it needs. Calorie counting means keeping track of how many calories you eat and drink each day. If you make sure to eat fewer calories than your body needs, you should lose weight. In order for calorie counting to work, you will need to eat the number of calories that are right for you in a day to lose a healthy amount of weight per week. A healthy amount of weight to lose per week is usually 1-2 lb (0.5-0.9 kg). A dietitian can determine how many calories you need in a day and give you suggestions on how to reach your calorie goal.  WHAT IS MY MY PLAN? My goal is to have __________ calories per day.  If I have this many calories per day, I should lose around __________ pounds per week. WHAT DO I NEED TO KNOW ABOUT CALORIE COUNTING? In order to  meet your daily calorie goal, you will need to:  Find out how many calories are in each food you would like to eat. Try to do this before you eat.  Decide how much of the food you can eat.  Write down what you ate and how many calories it had. Doing this is called keeping a food log. WHERE DO I FIND CALORIE INFORMATION? The number of calories in a food can be found on a Nutrition Facts label. Note that all the information on a label is based on a specific serving of the food. If a food does not have a Nutrition Facts label, try to look up the calories online or ask your dietitian for help. HOW DO I DECIDE HOW MUCH TO EAT? To decide how much of the food you can eat, you will need to consider both the number of calories in one serving and the size of one serving. This information can be found on the Nutrition Facts label. If a food does not have a Nutrition Facts label, look up the information online or ask your dietitian for help. Remember that calories are listed per serving. If you choose to have more than one serving of a food, you will have to multiply the calories per serving by the amount of servings you plan to eat. For example, the label on a package of bread might say that a serving  size is 1 slice and that there are 90 calories in a serving. If you eat 1 slice, you will have eaten 90 calories. If you eat 2 slices, you will have eaten 180 calories. HOW DO I KEEP A FOOD LOG? After each meal, record the following information in your food log:  What you ate.  How much of it you ate.  How many calories it had.  Then, add up your calories. Keep your food log near you, such as in a small notebook in your pocket. Another option is to use a mobile app or website. Some programs will calculate calories for you and show you how many calories you have left each time you add an item to the log. WHAT ARE SOME CALORIE COUNTING TIPS?  Use your calories on foods and drinks that will fill you up and  not leave you hungry. Some examples of this include foods like nuts and nut butters, vegetables, lean proteins, and high-fiber foods (more than 5 g fiber per serving).  Eat nutritious foods and avoid empty calories. Empty calories are calories you get from foods or beverages that do not have many nutrients, such as candy and soda. It is better to have a nutritious high-calorie food (such as an avocado) than a food with few nutrients (such as a bag of chips).  Know how many calories are in the foods you eat most often. This way, you do not have to look up how many calories they have each time you eat them.  Look out for foods that may seem like low-calorie foods but are really high-calorie foods, such as baked goods, soda, and fat-free candy.  Pay attention to calories in drinks. Drinks such as sodas, specialty coffee drinks, alcohol, and juices have a lot of calories yet do not fill you up. Choose low-calorie drinks like water and diet drinks.  Focus your calorie counting efforts on higher calorie items. Logging the calories in a garden salad that contains only vegetables is less important than calculating the calories in a milk shake.  Find a way of tracking calories that works for you. Get creative. Most people who are successful find ways to keep track of how much they eat in a day, even if they do not count every calorie. WHAT ARE SOME PORTION CONTROL TIPS?  Know how many calories are in a serving. This will help you know how many servings of a certain food you can have.  Use a measuring cup to measure serving sizes. This is helpful when you start out. With time, you will be able to estimate serving sizes for some foods.  Take some time to put servings of different foods on your favorite plates, bowls, and cups so you know what a serving looks like.  Try not to eat straight from a bag or box. Doing this can lead to overeating. Put the amount you would like to eat in a cup or on a plate to  make sure you are eating the right portion.  Use smaller plates, glasses, and bowls to prevent overeating. This is a quick and easy way to practice portion control. If your plate is smaller, less food can fit on it.  Try not to multitask while eating, such as watching TV or using your computer. If it is time to eat, sit down at a table and enjoy your food. Doing this will help you to start recognizing when you are full. It will also make you more aware of  what and how much you are eating. HOW CAN I CALORIE COUNT WHEN EATING OUT?  Ask for smaller portion sizes or child-sized portions.  Consider sharing an entree and sides instead of getting your own entree.  If you get your own entree, eat only half. Ask for a box at the beginning of your meal and put the rest of your entree in it so you are not tempted to eat it.  Look for the calories on the menu. If calories are listed, choose the lower calorie options.  Choose dishes that include vegetables, fruits, whole grains, low-fat dairy products, and lean protein. Focusing on smart food choices from each of the 5 food groups can help you stay on track at restaurants.  Choose items that are boiled, broiled, grilled, or steamed.  Choose water, milk, unsweetened iced tea, or other drinks without added sugars. If you want an alcoholic beverage, choose a lower calorie option. For example, a regular margarita can have up to 700 calories and a glass of wine has around 150.  Stay away from items that are buttered, battered, fried, or served with cream sauce. Items labeled "crispy" are usually fried, unless stated otherwise.  Ask for dressings, sauces, and syrups on the side. These are usually very high in calories, so do not eat much of them.  Watch out for salads. Many people think salads are a healthy option, but this is often not the case. Many salads come with bacon, fried chicken, lots of cheese, fried chips, and dressing. All of these items have a  lot of calories. If you want a salad, choose a garden salad and ask for grilled meats or steak. Ask for the dressing on the side, or ask for olive oil and vinegar or lemon to use as dressing.  Estimate how many servings of a food you are given. For example, a serving of cooked rice is  cup or about the size of half a tennis ball or one cupcake wrapper. Knowing serving sizes will help you be aware of how much food you are eating at restaurants. The list below tells you how big or small some common portion sizes are based on everyday objects.  1 oz-4 stacked dice.  3 oz-1 deck of cards.  1 tsp-1 dice.  1 Tbsp- a Ping-Pong ball.  2 Tbsp-1 Ping-Pong ball.   cup-1 tennis ball or 1 cupcake wrapper.  1 cup-1 baseball. This information is not intended to replace advice given to you by your health care provider. Make sure you discuss any questions you have with your health care provider. Document Released: 03/12/2005 Document Revised: 04/02/2014 Document Reviewed: 01/15/2013 Elsevier Interactive Patient Education  2017 [place] and Cholesterol Restricted Diet Introduction Getting too much fat and cholesterol in your diet may cause health problems. Following this diet helps keep your fat and cholesterol at normal levels. This can keep you from getting sick. What types of fat should I choose?  Choose monosaturated and polyunsaturated fats. These are found in foods such as olive oil, canola oil, flaxseeds, walnuts, almonds, and seeds.  Eat more omega-3 fats. Good choices include salmon, mackerel, sardines, tuna, flaxseed oil, and ground flaxseeds.  Limit saturated fats. These are in animal products such as meats, butter, and cream. They can also be in plant products such as palm oil, palm kernel oil, and coconut oil.  Avoid foods with partially hydrogenated oils in them. These contain trans fats. Examples of foods that have trans fats are  stick margarine, some tub margarines,  cookies, crackers, and other baked goods. What general guidelines do I need to follow?  Check food labels. Look for the words "trans fat" and "saturated fat."  When preparing a meal:  Fill half of your plate with vegetables and green salads.  Fill one fourth of your plate with whole grains. Look for the word "whole" as the first word in the ingredient list.  Fill one fourth of your plate with lean protein foods.  Eat more foods that have fiber, like apples, carrots, beans, peas, and barley.  Eat more home-cooked foods. Eat less at restaurants and buffets.  Limit or avoid alcohol.  Limit foods high in starch and sugar.  Limit fried foods.  Cook foods without frying them. Baking, boiling, grilling, and broiling are all great options.  Lose weight if you are overweight. Losing even a small amount of weight can help your overall health. It can also help prevent diseases such as diabetes and heart disease. What foods can I eat? Grains  Whole grains, such as whole wheat or whole grain breads, crackers, cereals, and pasta. Unsweetened oatmeal, bulgur, barley, quinoa, or brown rice. Corn or whole wheat flour tortillas. Vegetables  Fresh or frozen vegetables (raw, steamed, roasted, or grilled). Green salads. Fruits  All fresh, canned (in natural juice), or frozen fruits. Meat and Other Protein Products  Ground beef (85% or leaner), grass-fed beef, or beef trimmed of fat. Skinless chicken or [place]. Ground chicken or [place]. Pork trimmed of fat. All fish and seafood. Eggs. Dried beans, peas, or lentils. Unsalted nuts or seeds. Unsalted canned or dry beans. Dairy  Low-fat dairy products, such as skim or 1% milk, 2% or reduced-fat cheeses, low-fat ricotta or cottage cheese, or plain low-fat yogurt. Fats and Oils  Tub margarines without trans fats. Light or reduced-fat mayonnaise and salad dressings. Avocado. Olive, canola, sesame, or safflower oils. Natural peanut or almond butter (choose  ones without added sugar and oil). The items listed above may not be a complete list of recommended foods or beverages. Contact your dietitian for more options.  What foods are not recommended? Grains  White bread. White pasta. White rice. Cornbread. Bagels, pastries, and croissants. Crackers that contain trans fat. Vegetables  White potatoes. Corn. Creamed or fried vegetables. Vegetables in a cheese sauce. Fruits  Dried fruits. Canned fruit in light or heavy syrup. Fruit juice. Meat and Other Protein Products  Fatty cuts of meat. Ribs, chicken wings, bacon, sausage, bologna, salami, chitterlings, fatback, hot dogs, bratwurst, and packaged luncheon meats. Liver and organ meats. Dairy  Whole or 2% milk, cream, half-and-half, and cream cheese. Whole milk cheeses. Whole-fat or sweetened yogurt. Full-fat cheeses. Nondairy creamers and whipped toppings. Processed cheese, cheese spreads, or cheese curds. Sweets and Desserts  Corn syrup, sugars, honey, and molasses. Candy. Jam and jelly. Syrup. Sweetened cereals. Cookies, pies, cakes, donuts, muffins, and ice cream. Fats and Oils  Butter, stick margarine, lard, shortening, ghee, or bacon fat. Coconut, palm kernel, or palm oils. Beverages  Alcohol. Sweetened drinks (such as sodas, lemonade, and fruit drinks or punches). The items listed above may not be a complete list of foods and beverages to avoid. Contact your dietitian for more information.  This information is not intended to replace advice given to you by your health care provider. Make sure you discuss any questions you have with your health care provider. Document Released: 09/11/2011 Document Revised: 11/17/2015 Document Reviewed: 06/11/2013  2017 Elsevier Lipoma Introduction A lipoma is  a noncancerous (benign) tumor that is made up of fat cells. This is a very common type of soft-tissue growth. Lipomas are usually found under the skin (subcutaneous). They may occur in any tissue of the  body that contains fat. Common areas for lipomas to appear include the back, shoulders, buttocks, and thighs. Lipomas grow slowly, and they are usually painless. Most lipomas do not cause problems and do not require treatment. What are the causes? The cause of this condition is not known. What increases the risk? This condition is more likely to develop in:  People who are 24-25 years old.  People who have a family history of lipomas. What are the signs or symptoms? A lipoma usually appears as a small, round bump under the skin. It may feel soft or rubbery, but the firmness can vary. Most lipomas are not painful. However, a lipoma may become painful if it is located in an area where it pushes on nerves. How is this diagnosed? A lipoma can usually be diagnosed with a physical exam. You may also have tests to confirm the diagnosis and to rule out other conditions. Tests may include:  Imaging tests, such as a CT scan or MRI.  Removal of a tissue sample to be looked at under a microscope (biopsy). How is this treated? Treatment is not needed for small lipomas that are not causing problems. If a lipoma continues to get bigger or it causes problems, removal is often the best option. Lipomas can also be removed to improve appearance. Removal of a lipoma is usually done with a surgery in which the fatty cells and the surrounding capsule are removed. Most often, a medicine that numbs the area (local anesthetic) is used for this procedure. Follow these instructions at home:  Keep all follow-up visits as directed by your health care provider. This is important. Contact a health care provider if:  Your lipoma becomes larger or hard.  Your lipoma becomes painful, red, or increasingly swollen. These could be signs of infection or a more serious condition. This information is not intended to replace advice given to you by your health care provider. Make sure you discuss any questions you have with your  health care provider. Document Released: 03/02/2002 Document Revised: 08/18/2015 Document Reviewed: 03/08/2014  2017 Elsevier

## 2016-04-23 NOTE — Progress Notes (Signed)
Alexa [name] 41 y.o.   Chief Complaint  Patient presents with  . Lump on knee    HISTORY OF PRESENT ILLNESS: This is a 41 y.o. female complaining of lump on the left knee she noticed days-weeks ago; denies pain or any other significant symptoms.  HPI   Prior to Admission medications   Medication Sig Start Date End Date Taking? Authorizing Provider  clotrimazole (LOTRIMIN) 1 % cream Apply to affected area 2 times daily 01/28/16  Yes [employer] McVey, PA-C  cyclobenzaprine (FLEXERIL) 10 MG tablet Take 1 tablet (10 mg total) by mouth 3 (three) times daily as needed for muscle spasms. Take before bed at night for back spasm pain 03/31/16  Yes Shawnee Knapp, MD  furosemide (LASIX) 20 MG tablet Take 1 tablet (20 mg total) by mouth daily. 01/28/16  Yes Elizabeth Whitney McVey, PA-C  furosemide (LASIX) 40 MG tablet Take 1 tablet (40 mg total) by mouth daily. 01/28/16  Yes Elizabeth Whitney McVey, PA-C  IRON PO Take by mouth.   Yes Historical Provider, MD  losartan-hydrochlorothiazide (HYZAAR) 100-12.5 MG tablet Take 1 tablet by mouth daily. 01/28/16  Yes Elizabeth Whitney McVey, PA-C  metFORMIN (GLUCOPHAGE) 500 MG tablet Take 1 tablet (500 mg total) by mouth 2 (two) times daily with a meal. 01/28/16  Yes [employer] McVey, PA-C  nystatin (MYCOSTATIN/NYSTOP) powder Apply topically 4 (four) times daily. 03/24/16  Yes Shawnee Knapp, MD  atorvastatin (LIPITOR) 40 MG tablet Take 1 tablet (40 mg total) by mouth daily. Patient not taking: Reported on 04/23/2016 02/10/16   Gelene Mink McVey, PA-C  doxycycline (VIBRAMYCIN) 100 MG capsule Take 1 capsule (100 mg total) by mouth 2 (two) times daily. Patient not taking: Reported on 04/23/2016 03/31/16   Shawnee Knapp, MD  rivaroxaban (XARELTO) 20 MG TABS tablet Take 1 tablet (20 mg total) by mouth daily with supper. Patient not taking: Reported on 04/23/2016 10/08/15   Gregor Hams, MD    Allergies  Allergen Reactions  . Penicillins Hives    Has  patient had a PCN reaction causing immediate rash, facial/tongue/throat swelling, SOB or lightheadedness with hypotension: No Has patient had a PCN reaction causing severe rash involving mucus membranes or skin necrosis: No Has patient had a PCN reaction that required hospitalization No Has patient had a PCN reaction occurring within the last 10 years: No If all of the above answers are "NO", then may proceed with Cephalosporin use.    Patient Active Problem List   Diagnosis Date Noted  . Class 3 obesity due to excess calories with serious comorbidity and body mass index (BMI) greater than or equal to 70 in adult ([place]) 04/15/2016  . Athlete's foot 10/08/2015  . Diabetes ([place]) 06/22/2015  . Benign essential HTN 06/22/2015  . 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 BMI 70 and over, adult ([place]) 06/22/2015  . Vitamin D deficiency 06/22/2015  . Iron deficiency anemia 06/22/2015    Past Medical History:  Diagnosis Date  . Gestational diabetes   . Hypertension     Past Surgical History:  Procedure Laterality Date  . CESAREAN SECTION      Social History   Social History  . Marital status: Divorced    Spouse name: n/a  . Number of children: 0  . Years of education: N/A   Occupational History  . [employer]     Daycare   Social History Main Topics  . Smoking status: Current Some Day  Smoker    Types: Cigarettes  . Smokeless tobacco: Never Used     Comment: only smokes when she drinks alcohol  . Alcohol use 2.4 - 3.6 oz/week    4 - 6 Standard drinks or equivalent per week     Comment: 1-2 times/weeks  . Drug use: No  . Sexual activity: Not on file   Other Topics Concern  . Not on file   Social History Narrative   Her children live with her.   Ex-husband lives locally.    Family History  Problem Relation Age of Onset  . Diabetes Maternal Grandmother   . Hypertension Mother      Review of Systems  Constitutional: Negative for chills and  fever.  HENT: Negative for sore throat.   Eyes: Negative.   Respiratory: Negative for cough, hemoptysis and shortness of breath.   Cardiovascular: Negative for chest pain and palpitations.  Gastrointestinal: Negative for abdominal pain, nausea and vomiting.  Genitourinary: Negative for dysuria, flank pain and hematuria.  Musculoskeletal: Negative for myalgias.  Skin: Negative for rash.  Neurological: Negative.   Endo/Heme/Allergies: Negative.   All other systems reviewed and are negative.  Vitals:   04/23/16 1759  BP: (!) 152/98  Pulse: 98  Resp: (!) 22  Temp: 98.6 F (37 C)     Physical Exam  Constitutional: She is oriented to person, place, and time. She appears well-developed.  Morbidly obese.  HENT:  Head: Normocephalic and atraumatic.  Eyes: EOM are normal. Pupils are equal, round, and reactive to light.  Neck: Normal range of motion. Neck supple.  Cardiovascular: Normal rate and regular rhythm.   Pulmonary/Chest: Effort normal and breath sounds normal.  Abdominal: Soft.  Musculoskeletal: Normal range of motion.  Left knee: +lipomas (x2) to medial aspect; non-tender, no erythema, FROM, no sign of DVT or cellulitis. Rest of extremities: WNL  Neurological: She is alert and oriented to person, place, and time.  Skin: Skin is warm and dry. Capillary refill takes less than 2 seconds.  Psychiatric: She has a normal mood and affect. Her behavior is normal.  Vitals reviewed.    ASSESSMENT & PLAN: [name] was seen today for lump on knee.  Diagnoses and all orders for this visit:  Lipoma of left lower extremity  Body mass index (BMI) of 70 or greater in adult [facility]) -     Amb ref to Medical Nutrition Therapy-MNT    Patient Instructions       IF you received an x-ray today, you will receive an invoice from [facility] Rad[facility]. Please contact [facility] Radiology at [phone] with questions or concerns regarding your invoice.   IF you received labwork today,  you will receive an invoice from [facility]. Please contact [facility] at [phone] with questions or concerns regarding your invoice.   Our billing staff will not be able to assist you with questions regarding bills from these companies.  You will be contacted with the lab results as soon as they are available. The fastest way to get your results is to activate your My Chart account. Instructions are located on the last page of this paperwork. If you have not heard from [place] regarding the results in 2 weeks, please contact this office.        Calorie Counting for Weight Loss Calories are energy you get from the things you eat and drink. Your body uses this energy to keep you going throughout the day. The number of calories you eat affects your weight. When you  eat more calories than your body needs, your body stores the extra calories as fat. When you eat fewer calories than your body needs, your body burns fat to get the energy it needs. Calorie counting means keeping track of how many calories you eat and drink each day. If you make sure to eat fewer calories than your body needs, you should lose weight. In order for calorie counting to work, you will need to eat the number of calories that are right for you in a day to lose a healthy amount of weight per week. A healthy amount of weight to lose per week is usually 1-2 lb (0.5-0.9 kg). A dietitian can determine how many calories you need in a day and give you suggestions on how to reach your calorie goal.  WHAT IS MY MY PLAN? My goal is to have __________ calories per day.  If I have this many calories per day, I should lose around __________ pounds per week. WHAT DO I NEED TO KNOW ABOUT CALORIE COUNTING? In order to meet your daily calorie goal, you will need to:  Find out how many calories are in each food you would like to eat. Try to do this before you eat.  Decide how much of the food you can eat.  Write down what you ate and how many  calories it had. Doing this is called keeping a food log. WHERE DO I FIND CALORIE INFORMATION? The number of calories in a food can be found on a Nutrition Facts label. Note that all the information on a label is based on a specific serving of the food. If a food does not have a Nutrition Facts label, try to look up the calories online or ask your dietitian for help. HOW DO I DECIDE HOW MUCH TO EAT? To decide how much of the food you can eat, you will need to consider both the number of calories in one serving and the size of one serving. This information can be found on the Nutrition Facts label. If a food does not have a Nutrition Facts label, look up the information online or ask your dietitian for help. Remember that calories are listed per serving. If you choose to have more than one serving of a food, you will have to multiply the calories per serving by the amount of servings you plan to eat. For example, the label on a package of bread might say that a serving size is 1 slice and that there are 90 calories in a serving. If you eat 1 slice, you will have eaten 90 calories. If you eat 2 slices, you will have eaten 180 calories. HOW DO I KEEP A FOOD LOG? After each meal, record the following information in your food log:  What you ate.  How much of it you ate.  How many calories it had.  Then, add up your calories. Keep your food log near you, such as in a small notebook in your pocket. Another option is to use a mobile app or website. Some programs will calculate calories for you and show you how many calories you have left each time you add an item to the log. WHAT ARE SOME CALORIE COUNTING TIPS?  Use your calories on foods and drinks that will fill you up and not leave you hungry. Some examples of this include foods like nuts and nut butters, vegetables, lean proteins, and high-fiber foods (more than 5 g fiber per serving).  Eat nutritious foods  and avoid empty calories. Empty calories  are calories you get from foods or beverages that do not have many nutrients, such as candy and soda. It is better to have a nutritious high-calorie food (such as an avocado) than a food with few nutrients (such as a bag of chips).  Know how many calories are in the foods you eat most often. This way, you do not have to look up how many calories they have each time you eat them.  Look out for foods that may seem like low-calorie foods but are really high-calorie foods, such as baked goods, soda, and fat-free candy.  Pay attention to calories in drinks. Drinks such as sodas, specialty coffee drinks, alcohol, and juices have a lot of calories yet do not fill you up. Choose low-calorie drinks like water and diet drinks.  Focus your calorie counting efforts on higher calorie items. Logging the calories in a garden salad that contains only vegetables is less important than calculating the calories in a milk shake.  Find a way of tracking calories that works for you. Get creative. Most people who are successful find ways to keep track of how much they eat in a day, even if they do not count every calorie. WHAT ARE SOME PORTION CONTROL TIPS?  Know how many calories are in a serving. This will help you know how many servings of a certain food you can have.  Use a measuring cup to measure serving sizes. This is helpful when you start out. With time, you will be able to estimate serving sizes for some foods.  Take some time to put servings of different foods on your favorite plates, bowls, and cups so you know what a serving looks like.  Try not to eat straight from a bag or box. Doing this can lead to overeating. Put the amount you would like to eat in a cup or on a plate to make sure you are eating the right portion.  Use smaller plates, glasses, and bowls to prevent overeating. This is a quick and easy way to practice portion control. If your plate is smaller, less food can fit on it.  Try not to  multitask while eating, such as watching TV or using your computer. If it is time to eat, sit down at a table and enjoy your food. Doing this will help you to start recognizing when you are full. It will also make you more aware of what and how much you are eating. HOW CAN I CALORIE COUNT WHEN EATING OUT?  Ask for smaller portion sizes or child-sized portions.  Consider sharing an entree and sides instead of getting your own entree.  If you get your own entree, eat only half. Ask for a box at the beginning of your meal and put the rest of your entree in it so you are not tempted to eat it.  Look for the calories on the menu. If calories are listed, choose the lower calorie options.  Choose dishes that include vegetables, fruits, whole grains, low-fat dairy products, and lean protein. Focusing on smart food choices from each of the 5 food groups can help you stay on track at restaurants.  Choose items that are boiled, broiled, grilled, or steamed.  Choose water, milk, unsweetened iced tea, or other drinks without added sugars. If you want an alcoholic beverage, choose a lower calorie option. For example, a regular margarita can have up to 700 calories and a glass of wine has around  150.  Stay away from items that are buttered, battered, fried, or served with cream sauce. Items labeled "crispy" are usually fried, unless stated otherwise.  Ask for dressings, sauces, and syrups on the side. These are usually very high in calories, so do not eat much of them.  Watch out for salads. Many people think salads are a healthy option, but this is often not the case. Many salads come with bacon, fried chicken, lots of cheese, fried chips, and dressing. All of these items have a lot of calories. If you want a salad, choose a garden salad and ask for grilled meats or steak. Ask for the dressing on the side, or ask for olive oil and vinegar or lemon to use as dressing.  Estimate how many servings of a food  you are given. For example, a serving of cooked rice is  cup or about the size of half a tennis ball or one cupcake wrapper. Knowing serving sizes will help you be aware of how much food you are eating at restaurants. The list below tells you how big or small some common portion sizes are based on everyday objects.  1 oz-4 stacked dice.  3 oz-1 deck of cards.  1 tsp-1 dice.  1 Tbsp- a Ping-Pong ball.  2 Tbsp-1 Ping-Pong ball.   cup-1 tennis ball or 1 cupcake wrapper.  1 cup-1 baseball. This information is not intended to replace advice given to you by your health care provider. Make sure you discuss any questions you have with your health care provider. Document Released: 03/12/2005 Document Revised: 04/02/2014 Document Reviewed: 01/15/2013 Elsevier Interactive Patient Education  2017 [place] and Cholesterol Restricted Diet Introduction Getting too much fat and cholesterol in your diet may cause health problems. Following this diet helps keep your fat and cholesterol at normal levels. This can keep you from getting sick. What types of fat should I choose?  Choose monosaturated and polyunsaturated fats. These are found in foods such as olive oil, canola oil, flaxseeds, walnuts, almonds, and seeds.  Eat more omega-3 fats. Good choices include salmon, mackerel, sardines, tuna, flaxseed oil, and ground flaxseeds.  Limit saturated fats. These are in animal products such as meats, butter, and cream. They can also be in plant products such as palm oil, palm kernel oil, and coconut oil.  Avoid foods with partially hydrogenated oils in them. These contain trans fats. Examples of foods that have trans fats are stick margarine, some tub margarines, cookies, crackers, and other baked goods. What general guidelines do I need to follow?  Check food labels. Look for the words "trans fat" and "saturated fat."  When preparing a meal:  Fill half of your plate with vegetables and  green salads.  Fill one fourth of your plate with whole grains. Look for the word "whole" as the first word in the ingredient list.  Fill one fourth of your plate with lean protein foods.  Eat more foods that have fiber, like apples, carrots, beans, peas, and barley.  Eat more home-cooked foods. Eat less at restaurants and buffets.  Limit or avoid alcohol.  Limit foods high in starch and sugar.  Limit fried foods.  Cook foods without frying them. Baking, boiling, grilling, and broiling are all great options.  Lose weight if you are overweight. Losing even a small amount of weight can help your overall health. It can also help prevent diseases such as diabetes and heart disease. What foods can I eat? Grains  Whole grains, such as whole wheat or whole grain breads, crackers, cereals, and pasta. Unsweetened oatmeal, bulgur, barley, quinoa, or brown rice. Corn or whole wheat flour tortillas. Vegetables  Fresh or frozen vegetables (raw, steamed, roasted, or grilled). Green salads. Fruits  All fresh, canned (in natural juice), or frozen fruits. Meat and Other Protein Products  Ground beef (85% or leaner), grass-fed beef, or beef trimmed of fat. Skinless chicken or [place]. Ground chicken or [place]. Pork trimmed of fat. All fish and seafood. Eggs. Dried beans, peas, or lentils. Unsalted nuts or seeds. Unsalted canned or dry beans. Dairy  Low-fat dairy products, such as skim or 1% milk, 2% or reduced-fat cheeses, low-fat ricotta or cottage cheese, or plain low-fat yogurt. Fats and Oils  Tub margarines without trans fats. Light or reduced-fat mayonnaise and salad dressings. Avocado. Olive, canola, sesame, or safflower oils. Natural peanut or almond butter (choose ones without added sugar and oil). The items listed above may not be a complete list of recommended foods or beverages. Contact your dietitian for more options.  What foods are not recommended? Grains  White bread. White pasta.  White rice. Cornbread. Bagels, pastries, and croissants. Crackers that contain trans fat. Vegetables  White potatoes. Corn. Creamed or fried vegetables. Vegetables in a cheese sauce. Fruits  Dried fruits. Canned fruit in light or heavy syrup. Fruit juice. Meat and Other Protein Products  Fatty cuts of meat. Ribs, chicken wings, bacon, sausage, bologna, salami, chitterlings, fatback, hot dogs, bratwurst, and packaged luncheon meats. Liver and organ meats. Dairy  Whole or 2% milk, cream, half-and-half, and cream cheese. Whole milk cheeses. Whole-fat or sweetened yogurt. Full-fat cheeses. Nondairy creamers and whipped toppings. Processed cheese, cheese spreads, or cheese curds. Sweets and Desserts  Corn syrup, sugars, honey, and molasses. Candy. Jam and jelly. Syrup. Sweetened cereals. Cookies, pies, cakes, donuts, muffins, and ice cream. Fats and Oils  Butter, stick margarine, lard, shortening, ghee, or bacon fat. Coconut, palm kernel, or palm oils. Beverages  Alcohol. Sweetened drinks (such as sodas, lemonade, and fruit drinks or punches). The items listed above may not be a complete list of foods and beverages to avoid. Contact your dietitian for more information.  This information is not intended to replace advice given to you by your health care provider. Make sure you discuss any questions you have with your health care provider. Document Released: 09/11/2011 Document Revised: 11/17/2015 Document Reviewed: 06/11/2013  2017 Elsevier Lipoma Introduction A lipoma is a noncancerous (benign) tumor that is made up of fat cells. This is a very common type of soft-tissue growth. Lipomas are usually found under the skin (subcutaneous). They may occur in any tissue of the body that contains fat. Common areas for lipomas to appear include the back, shoulders, buttocks, and thighs. Lipomas grow slowly, and they are usually painless. Most lipomas do not cause problems and do not require treatment. What  are the causes? The cause of this condition is not known. What increases the risk? This condition is more likely to develop in:  People who are 4-10 years old.  People who have a family history of lipomas. What are the signs or symptoms? A lipoma usually appears as a small, round bump under the skin. It may feel soft or rubbery, but the firmness can vary. Most lipomas are not painful. However, a lipoma may become painful if it is located in an area where it pushes on nerves. How is this diagnosed? A lipoma can usually be diagnosed with a physical  exam. You may also have tests to confirm the diagnosis and to rule out other conditions. Tests may include:  Imaging tests, such as a CT scan or MRI.  Removal of a tissue sample to be looked at under a microscope (biopsy). How is this treated? Treatment is not needed for small lipomas that are not causing problems. If a lipoma continues to get bigger or it causes problems, removal is often the best option. Lipomas can also be removed to improve appearance. Removal of a lipoma is usually done with a surgery in which the fatty cells and the surrounding capsule are removed. Most often, a medicine that numbs the area (local anesthetic) is used for this procedure. Follow these instructions at home:  Keep all follow-up visits as directed by your health care provider. This is important. Contact a health care provider if:  Your lipoma becomes larger or hard.  Your lipoma becomes painful, red, or increasingly swollen. These could be signs of infection or a more serious condition. This information is not intended to replace advice given to you by your health care provider. Make sure you discuss any questions you have with your health care provider. Document Released: 03/02/2002 Document Revised: 08/18/2015 Document Reviewed: 03/08/2014  2017 Elsevier      Agustina Caroli, MD [facility]

## 2016-04-30 ENCOUNTER — Telehealth (HOSPITAL_COMMUNITY): Payer: Self-pay | Admitting: Radiology

## 2016-04-30 ENCOUNTER — Encounter (HOSPITAL_COMMUNITY): Payer: Self-pay | Admitting: Radiology

## 2016-04-30 NOTE — Telephone Encounter (Signed)
called to schedule echocardiogram 

## 2016-05-02 ENCOUNTER — Telehealth (HOSPITAL_COMMUNITY): Payer: Self-pay | Admitting: Physician Assistant

## 2016-05-04 NOTE — Telephone Encounter (Signed)
  05/02/2016 02:56 PM Phone (Outgoing) Theda Sers, Rheba M (Self) [phone] (H)   Left Message - Called pt and lmsg for her to CB in regards to echo.     By Verdene Rio

## 2016-05-12 ENCOUNTER — Ambulatory Visit (INDEPENDENT_AMBULATORY_CARE_PROVIDER_SITE_OTHER): Payer: BLUE CROSS/BLUE SHIELD | Admitting: Physician Assistant

## 2016-05-12 VITALS — BP 108/70 | HR 65 | Temp 98.6°F | Resp 16 | Ht 64.0 in | Wt >= 6400 oz

## 2016-05-12 DIAGNOSIS — M791 Myalgia, unspecified site: Secondary | ICD-10-CM

## 2016-05-12 DIAGNOSIS — M79661 Pain in right lower leg: Secondary | ICD-10-CM

## 2016-05-12 DIAGNOSIS — R05 Cough: Secondary | ICD-10-CM

## 2016-05-12 DIAGNOSIS — Z6841 Body Mass Index (BMI) 40.0 and over, adult: Secondary | ICD-10-CM | POA: Diagnosis not present

## 2016-05-12 DIAGNOSIS — L97509 Non-pressure chronic ulcer of other part of unspecified foot with unspecified severity: Secondary | ICD-10-CM

## 2016-05-12 DIAGNOSIS — I739 Peripheral vascular disease, unspecified: Secondary | ICD-10-CM

## 2016-05-12 DIAGNOSIS — R609 Edema, unspecified: Secondary | ICD-10-CM | POA: Diagnosis not present

## 2016-05-12 DIAGNOSIS — E11621 Type 2 diabetes mellitus with foot ulcer: Secondary | ICD-10-CM

## 2016-05-12 DIAGNOSIS — M79662 Pain in left lower leg: Secondary | ICD-10-CM | POA: Diagnosis not present

## 2016-05-12 DIAGNOSIS — R0602 Shortness of breath: Secondary | ICD-10-CM

## 2016-05-12 DIAGNOSIS — R059 Cough, unspecified: Secondary | ICD-10-CM

## 2016-05-12 LAB — POCT CBC
GRANULOCYTE PERCENT: 78.1 % (ref 37–80)
HEMATOCRIT: 41.2 % (ref 37.7–47.9)
HEMOGLOBIN: 13 g/dL (ref 12.2–16.2)
LYMPH, POC: 1.8 (ref 0.6–3.4)
MCH: 26 pg — AB (ref 27–31.2)
MCHC: 31.6 g/dL — AB (ref 31.8–35.4)
MCV: 82.4 fL (ref 80–97)
MID (cbc): 0.8 (ref 0–0.9)
MPV: 7.6 fL (ref 0–99.8)
POC GRANULOCYTE: 9.1 — AB (ref 2–6.9)
POC LYMPH PERCENT: 15.3 %L (ref 10–50)
POC MID %: 6.6 % (ref 0–12)
Platelet Count, POC: 306 10*3/uL (ref 142–424)
RBC: 5 M/uL (ref 4.04–5.48)
RDW, POC: 20.1 %
WBC: 11.6 10*3/uL — AB (ref 4.6–10.2)

## 2016-05-12 LAB — POCT INFLUENZA A/B
INFLUENZA B, POC: NEGATIVE
Influenza A, POC: NEGATIVE

## 2016-05-12 LAB — GLUCOSE, POCT (MANUAL RESULT ENTRY): POC GLUCOSE: 134 mg/dL — AB (ref 70–99)

## 2016-05-12 NOTE — Patient Instructions (Addendum)
mucinex for the cough Elevate the legs - waiting on legs      IF you received an x-ray today, you will receive an invoice from [facility] Radiology. Please contact [facility] at [phone] with questions or concerns regarding your invoice.   IF you received labwork today, you will receive an invoice from [place]. Please contact [facility] at [phone] with questions or concerns regarding your invoice.   Our billing staff will not be able to assist you with questions regarding bills from these companies.  You will be contacted with the lab results as soon as they are available. The fastest way to get your results is to activate your My Chart account. Instructions are located on the last page of this paperwork. If you have not heard from [place] regarding the results in 2 weeks, please contact this office.

## 2016-05-12 NOTE — Progress Notes (Signed)
Subjective:    Patient ID: Alexa [name], female    DOB: 1975-07-19, 41 y.o.   MRN: [id number]  05/12/2016  .  HPI    Pt presents to clinic with multiple vague complaints.  Pain in her calves and upper thighs 2 days ago - overall getting worse - hurts to move - worse on the left side - better when she rests.  She feel weak but she thinks that it is from the pain.  The pain is mainly in her calves but will move around the entire lower leg.  This feels different than when she had cellulitis.  She has had no fevers.  She has SOB - seems worse recently but she always has it - seems worse over the last 2 days at the same time frame as the cough development - she overall just does not feel well.    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  Myalgias and headaches.  No chills and unsure about fevers.  She will get the shakes.  She has some nasal congestion.  Glucose at home - does not check. No recent travel No h/o blood clot  She has a cane but she cannot use it at work without a note.  Started for knee pain but now she feels like it gives her strength and makes her more sturdy  Review of Systems  Constitutional: Positive for fatigue. Negative for chills and fever.  Eyes: Negative for visual disturbance.  Respiratory: Positive for cough and shortness of breath (worse with walking up steps).   Musculoskeletal: Positive for gait problem (feels unstable - not new).  Neurological: Negative for headaches.   Past Medical History:  Diagnosis Date  . Gestational diabetes   . Hypertension    Past Surgical History:  Procedure Laterality Date  . CESAREAN SECTION     Allergies  Allergen Reactions  . Penicillins Hives    Has patient had a PCN reaction causing immediate rash, facial/tongue/throat swelling, SOB or lightheadedness with hypotension: No Has patient had a PCN reaction causing severe rash involving mucus membranes or skin necrosis:  No Has patient had a PCN reaction that required hospitalization No Has patient had a PCN reaction occurring within the last 10 years: No If all of the above answers are "NO", then may proceed with Cephalosporin use.   Current Outpatient Prescriptions  Medication Sig Dispense Refill  . furosemide (LASIX) 40 MG tablet Take 1 tablet (40 mg total) by mouth daily. 30 tablet 3  . losartan-hydrochlorothiazide (HYZAAR) 100-12.5 MG tablet Take 1 tablet by mouth daily. 90 tablet 3  . metFORMIN (GLUCOPHAGE) 500 MG tablet Take 1 tablet (500 mg total) by mouth 2 (two) times daily with a meal. 180 tablet 3  . nystatin (MYCOSTATIN/NYSTOP) powder Apply topically 4 (four) times daily. 60 g 1  . ferrous sulfate 325 (65 FE) MG tablet Take 325 mg by mouth daily with breakfast.     No current facility-administered medications for this visit.    Social History   Social History  . Marital status: Divorced    Spouse name: n/a  . Number of children: 0  . Years of education: N/A   Occupational History  . [employer]   Social History Main Topics  . Smoking status: Current Some Day Smoker    Types: Cigarettes  . Smokeless tobacco: Never Used     Comment: only  smokes when she drinks alcohol - 1 pack per week  . Alcohol use 2.4 - 3.6 oz/week    4 - 6 Standard drinks or equivalent per week     Comment: 13-4 times/weeks  . Drug use: No  . Sexual activity: Not on file   Other Topics Concern  . Not on file   Social History Narrative   Her children live with her.   Ex-husband lives locally.   Family History  Problem Relation Age of Onset  . Diabetes Maternal Grandmother   . Hypertension Mother        Objective:    BP 108/70 (BP Location: Right Arm, Patient Position: Sitting, Cuff Size: Large) Comment (Cuff Size): Automatic Thigh Cuff  Pulse 65   Temp 98.6 F (37 C) (Oral)   Resp 16   Ht '5\' 4"'$  (1.626 m)   Wt (!) 426 lb 7 oz (193.4 kg)   LMP 05/01/2016 (Exact Date)    SpO2 95%   BMI 73.20 kg/m    Physical Exam  Constitutional: She is oriented to person, place, and time. She appears well-developed and well-nourished. No distress.  HENT:  Head: Normocephalic and atraumatic.  Right Ear: External ear normal.  Left Ear: External ear normal.  Nose: Nose normal.  Mouth/Throat: Oropharynx is clear and moist.  Eyes: Conjunctivae and EOM are normal. Pupils are equal, round, and reactive to light.  Neck: Normal range of motion. Neck supple. Carotid bruit is not present.  Cardiovascular: Normal rate, regular rhythm, normal heart sounds and intact distal pulses.   No murmur heard. Pulmonary/Chest: Effort normal and breath sounds normal. She has no wheezes.  Pt is able to finish full sentences  Abdominal: Soft. Bowel sounds are normal. She exhibits no mass.  Lymphadenopathy:    She has no cervical adenopathy.  Neurological: She is alert and oriented to person, place, and time.  Walks with cane - difficult to move due to size  Skin: Skin is warm and dry. She is not diaphoretic.  Thickened woody appearance of bilateral legs about 2/3 up lower legs - no erythema or warmth or signs of infection - pulses not palpable due to skin changes - toes are warm bilaterally  Psychiatric: She has a normal mood and affect. Her behavior is normal.   Results for orders placed or performed in visit on 05/12/16  Hemoglobin A1c  Result Value Ref Range   Hgb A1c MFr Bld 6.2 (H) 4.8 - 5.6 %   Est. average glucose Bld gHb Est-mCnc 131 mg/dL  CK  Result Value Ref Range   Total CK 49 24 - 173 U/L  CMP14+EGFR  Result Value Ref Range   Glucose 118 (H) 65 - 99 mg/dL   BUN 19 6 - 20 mg/dL   Creatinine, Ser 1.19 (H) 0.57 - 1.00 mg/dL   GFR calc non Af Amer 58 (L) >59 mL/min/1.73   GFR calc Af Amer 67 >59 mL/min/1.73   BUN/Creatinine Ratio 16 9 - 23   Sodium 140 134 - 144 mmol/L   Potassium 3.6 3.5 - 5.2 mmol/L   Chloride 93 (L) 96 - 106 mmol/L   CO2 32 (H) 18 - 29 mmol/L    Calcium 8.5 (L) 8.7 - 10.2 mg/dL   Total Protein 6.6 6.0 - 8.5 g/dL   Albumin 3.4 (L) 3.5 - 5.5 g/dL   Globulin, Total 3.2 1.5 - 4.5 g/dL   Albumin/Globulin Ratio 1.1 (L) 1.2 - 2.2   Bilirubin Total 0.8 0.0 - 1.2  mg/dL   Alkaline Phosphatase 79 39 - 117 IU/L   AST 32 0 - 40 IU/L   ALT 30 0 - 32 IU/L  D-dimer, quantitative (not at [facility])  Result Value Ref Range   D-DIMER 1.63 (H) 0.00 - 0.49 mg/L FEU  POCT CBC  Result Value Ref Range   WBC 11.6 (A) 4.6 - 10.2 K/uL   Lymph, poc 1.8 0.6 - 3.4   POC LYMPH PERCENT 15.3 10 - 50 %L   MID (cbc) 0.8 0 - 0.9   POC MID % 6.6 0 - 12 %M   POC Granulocyte 9.1 (A) 2 - 6.9   Granulocyte percent 78.1 37 - 80 %G   RBC 5.00 4.04 - 5.48 M/uL   Hemoglobin 13.0 12.2 - 16.2 g/dL   HCT, POC 41.2 37.7 - 47.9 %   MCV 82.4 80 - 97 fL   MCH, POC 26.0 (A) 27 - 31.2 pg   MCHC 31.6 (A) 31.8 - 35.4 g/dL   RDW, POC 20.1 %   Platelet Count, POC 306 142 - 424 K/uL   MPV 7.6 0 - 99.8 fL  POCT glucose (manual entry)  Result Value Ref Range   POC Glucose 134 (A) 70 - 99 mg/dl  POCT Influenza A/B  Result Value Ref Range   Influenza A, POC Negative Negative   Influenza B, POC Negative Negative   EKG - NSR without acute change    Assessment & Plan:   SOB (shortness of breath) - Plan: POCT CBC, POCT glucose (manual entry), EKG 12-Lead, POCT Influenza A/B, D-dimer, quantitative (not at [facility] - pt pulse ox is routinely under 95% at her visits here without any breathing complaints - we will wait for d- dimer results and if positive she will need further evaluation in the ED - she was on blood thinners last year due to superficial thrombosis but with her weight and decreased movement she is at increased risk  Body mass index (BMI) of 70 or greater in adult [facility]) - pt is interested in getting a letter for use of cane at work - this should be discussed with her PCP at her f/u   Edema, unspecified type - not new for patient - she could benefit from seeing vascular  specialist - ? Change from lasix to demedex to see if longer control of swelling as her swelling is worse in the evening after being at work  Type 2 diabetes mellitus with foot ulcer, without long-term current use of insulin ([place]) - Plan: Hemoglobin A1c, CMP14+EGFR - f/u with PCP for further care - pt to make an appt  Myalgia - Plan: POCT Influenza A/B, CK - check labs - likely related to size and viral illness  Cough - Plan: POCT Influenza A/B  Bilateral calf pain - Plan: D-dimer, quantitative (not at [facility])  Elizabeth Sauer  [facility] [place] 05/14/2016 12:36 PM  [phone] fax

## 2016-05-13 ENCOUNTER — Telehealth: Payer: Self-pay | Admitting: Physician Assistant

## 2016-05-13 LAB — CMP14+EGFR
ALT: 30 [IU]/L (ref 0–32)
AST: 32 [IU]/L (ref 0–40)
Albumin/Globulin Ratio: 1.1 — ABNORMAL LOW (ref 1.2–2.2)
Albumin: 3.4 g/dL — ABNORMAL LOW (ref 3.5–5.5)
Alkaline Phosphatase: 79 [IU]/L (ref 39–117)
BUN/Creatinine Ratio: 16 (ref 9–23)
BUN: 19 mg/dL (ref 6–20)
Bilirubin Total: 0.8 mg/dL (ref 0.0–1.2)
CO2: 32 mmol/L — ABNORMAL HIGH (ref 18–29)
Calcium: 8.5 mg/dL — ABNORMAL LOW (ref 8.7–10.2)
Chloride: 93 mmol/L — ABNORMAL LOW (ref 96–106)
Creatinine, Ser: 1.19 mg/dL — ABNORMAL HIGH (ref 0.57–1.00)
GFR calc Af Amer: 67 mL/min/{1.73_m2}
GFR calc non Af Amer: 58 mL/min/{1.73_m2} — ABNORMAL LOW
Globulin, Total: 3.2 g/dL (ref 1.5–4.5)
Glucose: 118 mg/dL — ABNORMAL HIGH (ref 65–99)
Potassium: 3.6 mmol/L (ref 3.5–5.2)
Sodium: 140 mmol/L (ref 134–144)
Total Protein: 6.6 g/dL (ref 6.0–8.5)

## 2016-05-13 LAB — HEMOGLOBIN A1C
ESTIMATED AVERAGE GLUCOSE: 131 mg/dL
HEMOGLOBIN A1C: 6.2 % — AB (ref 4.8–5.6)

## 2016-05-13 LAB — D-DIMER, QUANTITATIVE: D-DIMER: 1.63 mg{FEU}/L — ABNORMAL HIGH (ref 0.00–0.49)

## 2016-05-13 LAB — CK: CK TOTAL: 49 U/L (ref 24–173)

## 2016-05-13 NOTE — Telephone Encounter (Signed)
Called and left message regarding her results indicating that she should go to the ED for further evaluation likely [place] of her lower legs to r/o clot as well as possible CT angio due to SOB.  She was instructed to to the the ED and if she had further questions to call the answering service who would get in touch with me.

## 2016-05-14 ENCOUNTER — Emergency Department (HOSPITAL_COMMUNITY)
Admit: 2016-05-14 | Discharge: 2016-05-14 | Disposition: A | Discharge status: Home / Self Care | Payer: BLUE CROSS/BLUE SHIELD | Attending: Emergency Medicine | Admitting: Emergency Medicine

## 2016-05-14 ENCOUNTER — Inpatient Hospital Stay (HOSPITAL_COMMUNITY)
Admission: EM | Admit: 2016-05-14 | Discharge: 2016-05-22 | DRG: 175 | Disposition: A | Discharge status: Home Health | Payer: BLUE CROSS/BLUE SHIELD | Attending: Family Medicine | Admitting: Family Medicine

## 2016-05-14 ENCOUNTER — Emergency Department (HOSPITAL_COMMUNITY): Payer: BLUE CROSS/BLUE SHIELD

## 2016-05-14 ENCOUNTER — Telehealth: Payer: Self-pay | Admitting: Emergency Medicine

## 2016-05-14 ENCOUNTER — Encounter (HOSPITAL_COMMUNITY): Payer: Self-pay | Admitting: Emergency Medicine

## 2016-05-14 DIAGNOSIS — E662 Morbid (severe) obesity with alveolar hypoventilation: Secondary | ICD-10-CM | POA: Diagnosis present

## 2016-05-14 DIAGNOSIS — I272 Pulmonary hypertension, unspecified: Secondary | ICD-10-CM | POA: Diagnosis present

## 2016-05-14 DIAGNOSIS — J209 Acute bronchitis, unspecified: Secondary | ICD-10-CM

## 2016-05-14 DIAGNOSIS — E1149 Type 2 diabetes mellitus with other diabetic neurological complication: Secondary | ICD-10-CM

## 2016-05-14 DIAGNOSIS — Z880 Allergy status to penicillin: Secondary | ICD-10-CM | POA: Diagnosis not present

## 2016-05-14 DIAGNOSIS — I2699 Other pulmonary embolism without acute cor pulmonale: Secondary | ICD-10-CM | POA: Diagnosis not present

## 2016-05-14 DIAGNOSIS — R0902 Hypoxemia: Secondary | ICD-10-CM | POA: Diagnosis not present

## 2016-05-14 DIAGNOSIS — E875 Hyperkalemia: Secondary | ICD-10-CM | POA: Diagnosis present

## 2016-05-14 DIAGNOSIS — R0602 Shortness of breath: Secondary | ICD-10-CM | POA: Diagnosis present

## 2016-05-14 DIAGNOSIS — Z8249 Family history of ischemic heart disease and other diseases of the circulatory system: Secondary | ICD-10-CM

## 2016-05-14 DIAGNOSIS — F17210 Nicotine dependence, cigarettes, uncomplicated: Secondary | ICD-10-CM | POA: Diagnosis present

## 2016-05-14 DIAGNOSIS — Z86711 Personal history of pulmonary embolism: Secondary | ICD-10-CM | POA: Diagnosis present

## 2016-05-14 DIAGNOSIS — Z5329 Procedure and treatment not carried out because of patient's decision for other reasons: Secondary | ICD-10-CM | POA: Diagnosis not present

## 2016-05-14 DIAGNOSIS — E6609 Other obesity due to excess calories: Secondary | ICD-10-CM | POA: Diagnosis not present

## 2016-05-14 DIAGNOSIS — R509 Fever, unspecified: Secondary | ICD-10-CM

## 2016-05-14 DIAGNOSIS — E11621 Type 2 diabetes mellitus with foot ulcer: Secondary | ICD-10-CM | POA: Diagnosis not present

## 2016-05-14 DIAGNOSIS — I110 Hypertensive heart disease with heart failure: Secondary | ICD-10-CM | POA: Diagnosis present

## 2016-05-14 DIAGNOSIS — I10 Essential (primary) hypertension: Secondary | ICD-10-CM | POA: Diagnosis present

## 2016-05-14 DIAGNOSIS — Z86718 Personal history of other venous thrombosis and embolism: Secondary | ICD-10-CM

## 2016-05-14 DIAGNOSIS — I7411 Embolism and thrombosis of thoracic aorta: Secondary | ICD-10-CM | POA: Diagnosis present

## 2016-05-14 DIAGNOSIS — E669 Obesity, unspecified: Secondary | ICD-10-CM | POA: Diagnosis not present

## 2016-05-14 DIAGNOSIS — I2609 Other pulmonary embolism with acute cor pulmonale: Secondary | ICD-10-CM

## 2016-05-14 DIAGNOSIS — E119 Type 2 diabetes mellitus without complications: Secondary | ICD-10-CM | POA: Diagnosis present

## 2016-05-14 DIAGNOSIS — Z7984 Long term (current) use of oral hypoglycemic drugs: Secondary | ICD-10-CM

## 2016-05-14 DIAGNOSIS — J9601 Acute respiratory failure with hypoxia: Secondary | ICD-10-CM | POA: Diagnosis present

## 2016-05-14 DIAGNOSIS — J811 Chronic pulmonary edema: Secondary | ICD-10-CM

## 2016-05-14 DIAGNOSIS — IMO0001 Reserved for inherently not codable concepts without codable children: Secondary | ICD-10-CM

## 2016-05-14 DIAGNOSIS — I509 Heart failure, unspecified: Secondary | ICD-10-CM | POA: Diagnosis present

## 2016-05-14 DIAGNOSIS — R600 Localized edema: Secondary | ICD-10-CM | POA: Diagnosis present

## 2016-05-14 DIAGNOSIS — Z79899 Other long term (current) drug therapy: Secondary | ICD-10-CM | POA: Diagnosis not present

## 2016-05-14 DIAGNOSIS — Z833 Family history of diabetes mellitus: Secondary | ICD-10-CM

## 2016-05-14 DIAGNOSIS — I8290 Acute embolism and thrombosis of unspecified vein: Secondary | ICD-10-CM

## 2016-05-14 DIAGNOSIS — R000 Tachycardia, unspecified: Secondary | ICD-10-CM | POA: Diagnosis present

## 2016-05-14 DIAGNOSIS — Z6841 Body Mass Index (BMI) 40.0 and over, adult: Secondary | ICD-10-CM | POA: Diagnosis not present

## 2016-05-14 DIAGNOSIS — L97509 Non-pressure chronic ulcer of other part of unspecified foot with unspecified severity: Secondary | ICD-10-CM | POA: Diagnosis not present

## 2016-05-14 DIAGNOSIS — J9600 Acute respiratory failure, unspecified whether with hypoxia or hypercapnia: Secondary | ICD-10-CM | POA: Diagnosis not present

## 2016-05-14 HISTORY — DX: Other pulmonary embolism without acute cor pulmonale: I26.99

## 2016-05-14 LAB — URINALYSIS, ROUTINE W REFLEX MICROSCOPIC
BACTERIA UA: NONE SEEN
BILIRUBIN URINE: NEGATIVE
Glucose, UA: NEGATIVE mg/dL
KETONES UR: NEGATIVE mg/dL
NITRITE: NEGATIVE
PROTEIN: NEGATIVE mg/dL
SPECIFIC GRAVITY, URINE: 1.034 — AB (ref 1.005–1.030)
pH: 6 (ref 5.0–8.0)

## 2016-05-14 LAB — CBC WITH DIFFERENTIAL/PLATELET
BASOS PCT: 0 %
Basophils Absolute: 0 10*3/uL (ref 0.0–0.1)
EOS ABS: 0 10*3/uL (ref 0.0–0.7)
Eosinophils Relative: 0 %
HCT: 43.4 % (ref 36.0–46.0)
HEMOGLOBIN: 12.9 g/dL (ref 12.0–15.0)
LYMPHS ABS: 1.5 10*3/uL (ref 0.7–4.0)
Lymphocytes Relative: 10 %
MCH: 25.1 pg — ABNORMAL LOW (ref 26.0–34.0)
MCHC: 29.7 g/dL — AB (ref 30.0–36.0)
MCV: 84.6 fL (ref 78.0–100.0)
MONO ABS: 1.7 10*3/uL — AB (ref 0.1–1.0)
Monocytes Relative: 11 %
NEUTROS PCT: 79 %
Neutro Abs: 11.8 10*3/uL — ABNORMAL HIGH (ref 1.7–7.7)
PLATELETS: 309 10*3/uL (ref 150–400)
RBC: 5.13 MIL/uL — ABNORMAL HIGH (ref 3.87–5.11)
RDW: 19.1 % — AB (ref 11.5–15.5)
WBC: 15 10*3/uL — ABNORMAL HIGH (ref 4.0–10.5)

## 2016-05-14 LAB — BASIC METABOLIC PANEL
Anion gap: 9 (ref 5–15)
BUN: 17 mg/dL (ref 6–20)
CALCIUM: 8.3 mg/dL — AB (ref 8.9–10.3)
CO2: 34 mmol/L — ABNORMAL HIGH (ref 22–32)
CREATININE: 1.15 mg/dL — AB (ref 0.44–1.00)
Chloride: 93 mmol/L — ABNORMAL LOW (ref 101–111)
GFR calc Af Amer: >60 mL/min (ref >60)
GFR, EST NON AFRICAN AMERICAN: 60 mL/min — AB (ref >60)
Glucose, Bld: 103 mg/dL — ABNORMAL HIGH (ref 65–99)
Potassium: 3.1 mmol/L — ABNORMAL LOW (ref 3.5–5.1)
SODIUM: 136 mmol/L (ref 135–145)

## 2016-05-14 LAB — LACTIC ACID, PLASMA
LACTIC ACID, VENOUS: 0.9 mmol/L (ref 0.5–1.9)
LACTIC ACID, VENOUS: 1.2 mmol/L (ref 0.5–1.9)

## 2016-05-14 LAB — GLUCOSE, CAPILLARY: Glucose-Capillary: 129 mg/dL — ABNORMAL HIGH (ref 65–99)

## 2016-05-14 LAB — HEPARIN LEVEL (UNFRACTIONATED): Heparin Unfractionated: <0.1 IU/mL — ABNORMAL LOW (ref 0.30–0.70)

## 2016-05-14 LAB — PROTIME-INR
INR: 1.18
PROTHROMBIN TIME: 15.1 s (ref 11.4–15.2)

## 2016-05-14 LAB — MRSA PCR SCREENING: MRSA by PCR: NEGATIVE

## 2016-05-14 LAB — TROPONIN I: TROPONIN I: 0.04 ng/mL — AB (ref <0.03)

## 2016-05-14 MED ORDER — LOSARTAN POTASSIUM 50 MG PO TABS
100.0000 mg | ORAL_TABLET | Freq: Every day | ORAL | Status: DC
  Administered 2016-05-15 – 2016-05-18 (×4): 100 mg via ORAL
  Filled 2016-05-14 (×4): qty 2

## 2016-05-14 MED ORDER — HEPARIN (PORCINE) IN NACL 100-0.45 UNIT/ML-% IJ SOLN
1700.0000 [IU]/h | INTRAMUSCULAR | Status: DC
  Administered 2016-05-14: 1700 [IU]/h via INTRAVENOUS
  Filled 2016-05-14 (×2): qty 250

## 2016-05-14 MED ORDER — HEPARIN BOLUS VIA INFUSION
3000.0000 [IU] | Freq: Once | INTRAVENOUS | Status: AC
  Administered 2016-05-14: 3000 [IU] via INTRAVENOUS
  Filled 2016-05-14: qty 3000

## 2016-05-14 MED ORDER — HEPARIN (PORCINE) IN NACL 100-0.45 UNIT/ML-% IJ SOLN
2200.0000 [IU]/h | INTRAMUSCULAR | Status: DC
  Administered 2016-05-15: 2200 [IU]/h via INTRAVENOUS
  Filled 2016-05-14 (×2): qty 250

## 2016-05-14 MED ORDER — POTASSIUM CHLORIDE CRYS ER 20 MEQ PO TBCR
40.0000 meq | EXTENDED_RELEASE_TABLET | Freq: Two times a day (BID) | ORAL | Status: DC
  Administered 2016-05-14 – 2016-05-16 (×5): 40 meq via ORAL
  Filled 2016-05-14 (×5): qty 2

## 2016-05-14 MED ORDER — ONDANSETRON HCL 4 MG PO TABS: 4.0000 mg | ORAL_TABLET | Freq: Four times a day (QID) | ORAL | Status: DC | PRN

## 2016-05-14 MED ORDER — IOPAMIDOL (ISOVUE-370) INJECTION 76%
INTRAVENOUS | Status: AC
  Administered 2016-05-14: 100 mL via INTRAVENOUS
  Filled 2016-05-14: qty 100

## 2016-05-14 MED ORDER — LOSARTAN POTASSIUM-HCTZ 100-12.5 MG PO TABS: 1.0000 | ORAL_TABLET | Freq: Every day | ORAL | Status: DC

## 2016-05-14 MED ORDER — ACETAMINOPHEN 325 MG PO TABS
650.0000 mg | ORAL_TABLET | Freq: Four times a day (QID) | ORAL | Status: DC | PRN
  Administered 2016-05-15 – 2016-05-20 (×3): 650 mg via ORAL
  Filled 2016-05-14 (×3): qty 2

## 2016-05-14 MED ORDER — ACETAMINOPHEN 650 MG RE SUPP: 650.0000 mg | Freq: Four times a day (QID) | RECTAL | Status: DC | PRN

## 2016-05-14 MED ORDER — SODIUM CHLORIDE 0.9 % IJ SOLN
INTRAMUSCULAR | Status: AC
  Filled 2016-05-14: qty 50

## 2016-05-14 MED ORDER — ONDANSETRON HCL 4 MG/2ML IJ SOLN: 4.0000 mg | Freq: Four times a day (QID) | INTRAMUSCULAR | Status: DC | PRN

## 2016-05-14 MED ORDER — HEPARIN SODIUM (PORCINE) 5000 UNIT/ML IJ SOLN: 4000.0000 [IU] | Freq: Once | INTRAMUSCULAR | Status: DC

## 2016-05-14 MED ORDER — SODIUM CHLORIDE 0.9% FLUSH
3.0000 mL | Freq: Two times a day (BID) | INTRAVENOUS | Status: DC
  Administered 2016-05-14 – 2016-05-22 (×10): 3 mL via INTRAVENOUS

## 2016-05-14 MED ORDER — HEPARIN (PORCINE) IN NACL 100-0.45 UNIT/ML-% IJ SOLN: 14.0000 [IU]/kg/h | Freq: Once | INTRAMUSCULAR | Status: DC

## 2016-05-14 MED ORDER — INSULIN ASPART 100 UNIT/ML ~~LOC~~ SOLN: 0.0000 [IU] | Freq: Every day | SUBCUTANEOUS | Status: DC

## 2016-05-14 MED ORDER — INSULIN ASPART 100 UNIT/ML ~~LOC~~ SOLN
0.0000 [IU] | Freq: Three times a day (TID) | SUBCUTANEOUS | Status: DC
  Administered 2016-05-16: 3 [IU] via SUBCUTANEOUS
  Administered 2016-05-17: 4 [IU] via SUBCUTANEOUS
  Administered 2016-05-18: 3 [IU] via SUBCUTANEOUS
  Administered 2016-05-19: 4 [IU] via SUBCUTANEOUS
  Administered 2016-05-20: 3 [IU] via SUBCUTANEOUS
  Administered 2016-05-20: 4 [IU] via SUBCUTANEOUS
  Administered 2016-05-20: 3 [IU] via SUBCUTANEOUS
  Administered 2016-05-21: 4 [IU] via SUBCUTANEOUS
  Administered 2016-05-21: 3 [IU] via SUBCUTANEOUS
  Administered 2016-05-21 – 2016-05-22 (×3): 4 [IU] via SUBCUTANEOUS
  Administered 2016-05-22: 3 [IU] via SUBCUTANEOUS

## 2016-05-14 MED ORDER — HEPARIN BOLUS VIA INFUSION
5000.0000 [IU] | Freq: Once | INTRAVENOUS | Status: AC
  Administered 2016-05-14: 5000 [IU] via INTRAVENOUS
  Filled 2016-05-14: qty 5000

## 2016-05-14 MED ORDER — FERROUS SULFATE 325 (65 FE) MG PO TABS
325.0000 mg | ORAL_TABLET | Freq: Every day | ORAL | Status: DC
  Administered 2016-05-15 – 2016-05-22 (×8): 325 mg via ORAL
  Filled 2016-05-14 (×8): qty 1

## 2016-05-14 MED ORDER — HYDROCHLOROTHIAZIDE 12.5 MG PO CAPS
12.5000 mg | ORAL_CAPSULE | Freq: Every day | ORAL | Status: DC
  Administered 2016-05-15 – 2016-05-17 (×3): 12.5 mg via ORAL
  Filled 2016-05-14 (×3): qty 1

## 2016-05-14 NOTE — Progress Notes (Addendum)
ANTICOAGULATION CONSULT NOTE - Initial Consult  Pharmacy Consult for heparin Indication: PE with aortic thrombi  Allergies  Allergen Reactions  . Penicillins Hives    Has patient had a PCN reaction causing immediate rash, facial/tongue/throat swelling, SOB or lightheadedness with hypotension: No Has patient had a PCN reaction causing severe rash involving mucus membranes or skin necrosis: No Has patient had a PCN reaction that required hospitalization No Has patient had a PCN reaction occurring within the last 10 years: No If all of the above answers are "NO", then may proceed with Cephalosporin use.    Patient Measurements: Listed height: 64 in Listed weight 193.4 kg Heparin Dosing Weight: 106 kg  Vital Signs: Temp: 98.2 F (36.8 C) (02/19 1040) Temp Source: Oral (02/19 1040) BP: 140/62 (02/19 1557) Pulse Rate: 92 (02/19 1557)  Labs:  Recent Labs  05/12/16 1229 05/12/16 1330 05/14/16 1056  HGB 13.0  --  12.9  HCT 41.2  --  43.4  PLT  --   --  309  LABPROT  --   --  15.1  INR  --   --  1.18  CREATININE  --  1.19* 1.15*  CKTOTAL  --  49  --     Estimated Creatinine Clearance (by C-G formula based on SCr of 1.15 mg/dL (H)) Female: 115.4 mL/min Female: 139.1 mL/min   Medical History: Past Medical History:  Diagnosis Date  . Gestational diabetes   . Hypertension     Medications:  Scheduled:  . sodium chloride        Assessment: Pharmacy is consulted to dose heparin in 41 yo female diagnosed with PE as well as incidental aortic thrombi.  Not on any anticoagulants PTA. Pt was previously on Xarelto previously, but was not on med at current time.   Today, 05/14/16  Baseline hgb 12.9, plt 309  SCr 1.15  PT 15.1, INR 1.18  Goal of Therapy:  Heparin level 0.3-0.7 units/ml Monitor platelets by anticoagulation protocol: Yes   Plan:   Heparin 5000 unit bolus, then heparin IV 1700 units/hr  Heparin level 6 hours after start of infusion  Monitor for  signs and symptoms of bleeding   Royetta Asal, PharmD, BCPS Pager [phone] 05/14/2016 4:47 PM

## 2016-05-14 NOTE — Progress Notes (Signed)
CRITICAL VALUE ALERT  Critical value received: troponin 0.04  Date of notification:  05/14/2016.  Time of notification:  2315  Critical value read back: yes  Nurse who received alert:  Marcello Fennel  MD notified (1st page):  Baltazar Najjar  Time of first page:  05/14/2016 @ 2335

## 2016-05-14 NOTE — Consult Note (Signed)
PULMONARY / CRITICAL CARE MEDICINE   Name: Alexa [name] MRN: [id number] DOB: [birth year]    ADMISSION DATE:  05/14/2016 CONSULTATION DATE:  02/19/8  REFERRING MD:  Tat  CHIEF COMPLAINT:  SOB  HISTORY OF PRESENT ILLNESS:  Alexa [name] is a 41 y.o. female with PMH as outlined below. She presented to [facility] ED 02/19 with SOB and LLE x 5 days.  She was seen at urgent care 05/12/17 and after D-dimer returned elevated, she was sent to ED 05/14/16 for further eval.  In ED, CTA demonstrated PE in distal right PA, segmental emboli in RUL and RLL, and thrombus also incidentally noted in descending thoracic aorta.  She was started on heparin and PCCM was asked to see.  She has hx of superficial thrombus of left saphenous vein in 2017, treated with 3 months xarelto.  No further hx VTE, malignancy, hemoptysis, estrogen use.  She has not had any recent periods of prolonged immobilization.  Has morbid obesity (431 lbs).  PAST MEDICAL HISTORY :  She  has a past medical history of Gestational diabetes and Hypertension.  PAST SURGICAL HISTORY: She  has a past surgical history that includes Cesarean section.  Allergies  Allergen Reactions  . Penicillins Hives    Has patient had a PCN reaction causing immediate rash, facial/tongue/throat swelling, SOB or lightheadedness with hypotension: No Has patient had a PCN reaction causing severe rash involving mucus membranes or skin necrosis: No Has patient had a PCN reaction that required hospitalization No Has patient had a PCN reaction occurring within the last 10 years: No If all of the above answers are "NO", then may proceed with Cephalosporin use.    No current facility-administered medications on file prior to encounter.    Current Outpatient Prescriptions on File Prior to Encounter  Medication Sig  . furosemide (LASIX) 40 MG tablet Take 1 tablet (40 mg total) by mouth daily.  Marland Kitchen losartan-hydrochlorothiazide (HYZAAR) 100-12.5 MG tablet Take 1  tablet by mouth daily.  . metFORMIN (GLUCOPHAGE) 500 MG tablet Take 1 tablet (500 mg total) by mouth 2 (two) times daily with a meal.  . nystatin (MYCOSTATIN/NYSTOP) powder Apply topically 4 (four) times daily.    FAMILY HISTORY:  Her indicated that her mother is alive. She indicated that her father is deceased. She indicated that both of her brothers are alive. She indicated that her maternal grandmother is alive. She indicated that her maternal grandfather is deceased. She indicated that her paternal grandmother is deceased. She indicated that her paternal grandfather is deceased. She indicated that her daughter is alive. She indicated that her son is alive.    SOCIAL HISTORY: She  reports that she has been smoking Cigarettes.  She has never used smokeless tobacco. She reports that she drinks about 2.4 - 3.6 oz of alcohol per week . She reports that she does not use drugs.  REVIEW OF SYSTEMS:   All negative; except for those that are bolded, which indicate positives.  Constitutional: weight loss, weight gain, night sweats, fevers, chills, fatigue, weakness.  HEENT: headaches, sore throat, sneezing, nasal congestion, post nasal drip, difficulty swallowing, tooth/dental problems, visual complaints, visual changes, ear aches. Neuro: difficulty with speech, weakness, numbness, ataxia. CV:  chest pain, orthopnea, PND, swelling and pain in LLE, dizziness, palpitations, syncope.  Resp: cough, hemoptysis, dyspnea, wheezing. GI: heartburn, indigestion, abdominal pain, nausea, vomiting, diarrhea, constipation, change in bowel habits, loss of appetite, hematemesis, melena, hematochezia.  GU: dysuria, change in color of urine, urgency or  frequency, flank pain, hematuria. MSK: joint pain or swelling, decreased range of motion. Psych: change in mood or affect, depression, anxiety, suicidal ideations, homicidal ideations. Skin: rash, itching, bruising.   SUBJECTIVE:  Denies chest pain, SOB.  VITAL  SIGNS: BP (!) 103/49 (BP Location: Left Arm)   Pulse 94   Temp 98.3 F (36.8 C) (Oral)   Resp 17   Ht 5\' 4"  (1.626 m)   Wt (!) 195.5 kg (431 lb)   LMP 05/01/2016 (Exact Date)   SpO2 95%   BMI 73.98 kg/m   HEMODYNAMICS:    VENTILATOR SETTINGS:    INTAKE / OUTPUT: No intake/output data recorded.   PHYSICAL EXAMINATION: General: Morbidly obese young AA female, in NAD. Neuro: A&O x 3, non-focal.  HEENT: [place]/AT. PERRL, sclerae anicteric. Cardiovascular: RRR, no M/R/G.  Lungs: Respirations even and unlabored.  CTA bilaterally, No W/R/R.  Abdomen: Morbidly obese, abd soft, NT/ND.  Musculoskeletal: No gross deformities, no edema. Skin: Intact, warm, no rashes.  LABS:  BMET  Recent Labs Lab 05/12/16 1330 05/14/16 1056  NA 140 136  K 3.6 3.1*  CL 93* 93*  CO2 32* 34*  BUN 19 17  CREATININE 1.19* 1.15*  GLUCOSE 118* 103*    Electrolytes  Recent Labs Lab 05/12/16 1330 05/14/16 1056  CALCIUM 8.5* 8.3*    CBC  Recent Labs Lab 05/12/16 1229 05/14/16 1056  WBC 11.6* 15.0*  HGB 13.0 12.9  HCT 41.2 43.4  PLT  --  309    Coag's  Recent Labs Lab 05/14/16 1056  INR 1.18    Sepsis Markers  Recent Labs Lab 05/14/16 1750  LATICACIDVEN 0.9    ABG No results for input(s): PHART, PCO2ART, PO2ART in the last 168 hours.  Liver Enzymes  Recent Labs Lab 05/12/16 1330  AST 32  ALT 30  ALKPHOS 79  BILITOT 0.8  ALBUMIN 3.4*    Cardiac Enzymes No results for input(s): TROPONINI, PROBNP in the last 168 hours.  Glucose No results for input(s): GLUCAP in the last 168 hours.  Imaging Ct Angio Chest Pe W Or Wo Contrast  Result Date: 05/14/2016 CLINICAL DATA:  Shortness of breath. Bilateral leg swelling. On Xarelto. EXAM: CT ANGIOGRAPHY CHEST WITH CONTRAST TECHNIQUE: Multidetector CT imaging of the chest was performed using the standard protocol during bolus administration of intravenous contrast. Multiplanar CT image reconstructions and MIPs were  obtained to evaluate the vascular anatomy. CONTRAST:  100 mL Isovue 370 COMPARISON:  Chest radiograph 05/14/2016 FINDINGS: Cardiovascular: There is nonocclusive embolus in the distal right main pulmonary artery extending into the right upper lobe, interlobar, and right lower lobe arteries. There is also evidence of segmental and subsegmental emboli in the right upper and lower lobes, although evaluation is limited by patient body habitus and motion artifact. No central left-sided emboli are identified. Evaluation of left lower lobe segmental and subsegmental vessels is limited by motion. There is flattening of the interventricular septum. The right ventricle to left ventricle ratio is abnormally elevated at 1.26. The main pulmonary artery is enlarged, measuring 4.2 cm in diameter. The heart is mildly enlarged. No pericardial effusion. The thoracic aorta is normal in caliber, however there is prominent thrombus within the predominantly distal descending thoracic aorta extending into the proximal abdominal aorta, incompletely visualized. Mediastinum/Nodes: No enlarged axillary, mediastinal, or hilar lymph nodes. Lungs/Pleura: No pleural effusion or pneumothorax. Evaluation of the lungs is limited by motion artifact. There is a small amount of patchy opacity in the basilar lower lobes bilaterally, greater  on the left. Upper Abdomen: Partially visualized aortic thrombus as described above. Musculoskeletal: No acute osseous abnormality or suspicious osseous lesion. Review of the MIP images confirms the above findings. IMPRESSION: 1. Positive for acute PE with CT evidence of right heart strain (RV/LV Ratio = 1.26) consistent with at least submassive (intermediate risk) PE. The presence of right heart strain has been associated with an increased risk of morbidity and mortality. Given the main pulmonary artery enlargement, there may be a component of underlying chronic pulmonary arterial hypertension contributing to the  right ventricular enlargement. 2. Thrombus in the distal descending thoracic aorta and proximal abdominal aorta, incompletely visualized. CTA of the abdomen and pelvis is recommended to further assess the extent of thrombus and patency of the visceral arteries. 3. Mild bibasilar lung opacities which may reflect atelectasis, infection, or early infarcts. Critical Value/emergent results were called by telephone at the time of interpretation on 05/14/2016 at 2:54 pm to Dr. Fredia Sorrow , who verbally acknowledged these results. Electronically Signed   By: Logan Bores M.D.   On: 05/14/2016 15:10   Dg Chest Port 1 View  Result Date: 05/14/2016 CLINICAL DATA:  Left leg pain. Shortness of breath for the past week. History of hypertension, diabetes, current smoker. EXAM: PORTABLE CHEST 1 VIEW COMPARISON:  Chest x-ray of December 30, 2015 FINDINGS: The lungs are well-expanded. The interstitial markings are increased. The pulmonary vascularity is engorged. The cardiac silhouette is mildly enlarged. There is no pleural effusion. There is no alveolar infiltrate. The mediastinum is normal in width. IMPRESSION: Findings consistent with miles pulmonary edema likely of cardiac cause. No alveolar pneumonia. When the patient can tolerate the procedure, a PA and lateral chest x-ray would be useful. Electronically Signed   By: David  Martinique M.D.   On: 05/14/2016 13:06     STUDIES:  CTA chest 02/19 > Acute PE on right (RV / LV 1.26).  Thrombus in distal descending thoracic aorta and proximal abdominal aorta.  Recommend CTA of abdomen and pelvis. LE duplex 02/19 > negative (but inconclusive as poor exam given body habitus).  CULTURES: None.  ANTIBIOTICS: None.  SIGNIFICANT EVENTS: 02/19 > admit.  LINES/TUBES: None.  DISCUSSION: 41 y.o. female admitted 02/19 with acute PE and had incidental finding descending thoracic aorta thrombus.  ASSESSMENT / PLAN:  Acute right sided PE - appears unprovoked (though pt at  risk given morbid obesity and prior hx superficial left saphenous thrombus in 2017). Acute hypoxic respiratory failure - due to above. Plan: Continue heparin gtt. Transition to oral agent. Assess echo, add bubble study to r/o PFO. Trend troponin. Continue supplemental O2 as needed to maintain SpO2 > 92%. 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  Incidental finding of descending thoracic aorta thrombus. Plan: Continue heparin gtt. Echo with bubble study to r/o PFO. Agree, consider CTA abdomen / pelvis to assess for further clot burden. Would hold on full hypercoagulable workup as pt already started on heparin; therefore, results will be inconclusive.  Rest per primary team.  Family updated: None.  Interdisciplinary Family Meeting v Palliative Care Meeting:  Due by: 05/21/16.  CC time: 30 min.   Nothing further to add.  PCCM will sign off.  Please do not hesitate to call us back if we can be of any further assistance.   Montey Hora, [facility] Pager: [phone]  or [phone] 05/14/2016, 8:25 PM   ATTENDING NOTE /  ATTESTATION NOTE :   I have discussed the case with the resident/APP  Montey Hora PA  I agree with the resident/APP's  history, physical examination, assessment, and plans.    I have edited the above note and modified it according to our agreed history, physical examination, assessment and plan.   Briefly, pt admitted for SOB/elevated D dimer. Patient smokes cigarettes, usually over the weekend, for over 20 years. She denies asthma, COPD. Last year, she was placed on xarelto for 3 months for presumed DVT. She has morbid obesity. Likely also has sleep apnea with snoring, gasping, fatigue.  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 She is fairly functional and is always on the go. Last Wednesday, she noticed swelling of her left leg. Over the weekend, she drove to [place], [place] which is an hour. They drove back the same day. No other recent travel. She started having flulike symptoms last Thursday. She went to the urgent care and was noted to have low oxygen. Influenza was negative. D-dimer was elevated so she was advised to go to the emergency room for further evaluation. CTA demonstrated PE in distal right PA, segmental emboli in RUL and RLL, and thrombus also incidentally noted in descending thoracic aorta.  She was started on heparin and PCCM was asked to see.  She has hx of superficial thrombus of left saphenous vein in 2017, treated with 3 months xarelto.  No further hx VTE, malignancy, hemoptysis, estrogen use.  She has not had any recent periods of prolonged immobilization.  Has morbid obesity (431 lbs).  Patient was seen, comfortable. Not in distress. Her cough and dyspnea are better. Tolerating heparin drip.   Vitals:  Vitals:   05/14/16 1557 05/14/16 1837 05/14/16 1926 05/14/16 2000  BP: 140/62 (!) 103/49  (!) 116/59  Pulse: 92 94  95  Resp: 21 17  (!) 29  Temp:  99.3 F (37.4 C) 98.3 F (36.8 C)   TempSrc:  Oral Oral   SpO2: 96% 95%  93%  Weight:  (!) 195.5 kg (431 lb)    Height:  5\' 4"  (1.626 m)      Constitutional/General: Morbidly obese, laying bed, comfortably watching TV. She was also on the phone.  Body mass index is 73.98 kg/m. Wt Readings from Last 3 Encounters:  05/14/16 (!) 195.5 kg (431 lb)  05/12/16 (!) 193.4 kg (426 lb 7 oz)  04/23/16 (!) 193.7 kg (427 lb)    HEENT: PERLA, anicteric sclerae. (-) Oral thrush. Neck: No masses. Midline trachea. No JVD, (-) LAD. (-) bruits appreciated.Short stout neck.  Respiratory/Chest: Grossly normal chest. (-) deformity. (-) Accessory muscle use.  Symmetric expansion. Diminished BS on both lower lung zones. (-) wheezing, crackles, rhonchi (-) egophony  Cardiovascular: Regular rate and  rhythm, heart sounds normal, no murmur or gallops,  Gr 2  peripheral edema  Gastrointestinal:   Normal bowel sounds. Soft, non-tender. No hepatosplenomegaly.  (-) masses.   Musculoskeletal:  Normal muscle tone.   Extremities: Grossly normal. (-) clubbing, cyanosis.  (-) edema  Skin: (-) rash,lesions seen.   Neurological/Psychiatric : CN grossly intact. (-) lateralizing signs.     CBC Recent Labs     05/12/16  1229  05/14/16  1056  WBC  11.6*  15.0*  HGB  13.0  12.9  HCT  41.2  43.4  PLT   --   309    Coag's Recent Labs     05/14/16  1056  INR  1.18    BMET Recent Labs     05/12/16  1330  05/14/16  1056  NA  140  136  K  3.6  3.1*  CL  93*  93*  CO2  32*  34*  BUN  19  17  CREATININE  1.19*  1.15*  GLUCOSE  118*  103*    Electrolytes Recent Labs     05/12/16  1330  05/14/16  1056  CALCIUM  8.5*  8.3*    Sepsis Markers No results for input(s): PROCALCITON, O2SATVEN in the last 72 hours.  Invalid input(s): LACTICACIDVEN  ABG No results for input(s): PHART, PCO2ART, PO2ART in the last 72 hours.  Liver Enzymes Recent Labs     05/12/16  1330  AST  32  ALT  30  ALKPHOS  79  BILITOT  0.8  ALBUMIN  3.4*    Cardiac Enzymes No results for input(s): TROPONINI, PROBNP in the last 72 hours.  Glucose Recent Labs     05/14/16  2135  GLUCAP  129*    Imaging Ct Angio Chest Pe W Or Wo Contrast  Result Date: 05/14/2016 CLINICAL DATA:  Shortness of breath. Bilateral leg swelling. On Xarelto. EXAM: CT ANGIOGRAPHY CHEST WITH CONTRAST TECHNIQUE: Multidetector CT imaging of the chest was performed using the standard protocol during bolus administration of intravenous contrast. Multiplanar CT image reconstructions and MIPs were obtained to evaluate the vascular anatomy. CONTRAST:  100 mL Isovue 370 COMPARISON:  Chest radiograph 05/14/2016 FINDINGS: Cardiovascular: There is nonocclusive embolus in the distal right main pulmonary artery extending into the right upper lobe, interlobar, and right lower lobe arteries. There is also evidence of segmental  and subsegmental emboli in the right upper and lower lobes, although evaluation is limited by patient body habitus and motion artifact. No central left-sided emboli are identified. Evaluation of left lower lobe segmental and subsegmental vessels is limited by motion. There is flattening of the interventricular septum. The right ventricle to left ventricle ratio is abnormally elevated at 1.26. The main pulmonary artery is enlarged, measuring 4.2 cm in diameter. The heart is mildly enlarged. No pericardial effusion. The thoracic aorta is normal in caliber, however there is prominent thrombus within the predominantly distal descending thoracic aorta extending into the proximal abdominal aorta, incompletely visualized. Mediastinum/Nodes: No enlarged axillary, mediastinal, or hilar lymph nodes. Lungs/Pleura: No pleural effusion or pneumothorax. Evaluation of the lungs is limited by motion artifact. There is a small amount of patchy opacity in the basilar lower lobes bilaterally, greater on the left. Upper Abdomen: Partially visualized aortic thrombus as described above. Musculoskeletal: No acute osseous abnormality or suspicious osseous lesion. Review of the MIP images confirms the above findings. IMPRESSION: 1. Positive for acute PE with CT evidence of right heart strain (RV/LV Ratio = 1.26) consistent with at least submassive (intermediate risk) PE. The presence of right heart strain has been associated with an increased risk of morbidity and mortality. Given the main pulmonary artery enlargement, there may be a component of underlying chronic pulmonary arterial hypertension contributing to the right ventricular enlargement. 2. Thrombus in the distal descending thoracic aorta and proximal abdominal aorta, incompletely visualized. CTA of the abdomen and pelvis is recommended to further assess the extent of thrombus and patency of the visceral arteries. 3. Mild bibasilar lung opacities which may reflect atelectasis,  infection, or early infarcts. Critical Value/emergent results were called by telephone at the time of interpretation on 05/14/2016 at 2:54 pm to Dr. Fredia Sorrow , who verbally  acknowledged these results. Electronically Signed   By: Logan Bores M.D.   On: 05/14/2016 15:10   Dg Chest Port 1 View  Result Date: 05/14/2016 CLINICAL DATA:  Left leg pain. Shortness of breath for the past week. History of hypertension, diabetes, current smoker. EXAM: PORTABLE CHEST 1 VIEW COMPARISON:  Chest x-ray of December 30, 2015 FINDINGS: The lungs are well-expanded. The interstitial markings are increased. The pulmonary vascularity is engorged. The cardiac silhouette is mildly enlarged. There is no pleural effusion. There is no alveolar infiltrate. The mediastinum is normal in width. IMPRESSION: Findings consistent with miles pulmonary edema likely of cardiac cause. No alveolar pneumonia. When the patient can tolerate the procedure, a PA and lateral chest x-ray would be useful. Electronically Signed   By: David  Martinique M.D.   On: 05/14/2016 13:06   Assessment/Plan : Pulmonary embolism, likely unprovoked. By history, it probably started off on her left lower extremity. By history as well, she had  swelling of her leg before her travel to [place], [place] over the weekend. - Continue heparin drip. - Transition to PO NOAC in the next day or so. She was on Xarelto before and she tolerated it and her insurance covers it. Most likely she will be on anticoagulation indefinitely. - Check 2-D echo, troponin, BNP. - No indication for thrombolytics. - No indication for EKOS - keep o2 sats > 88%.  - More importantly, patient has an incidental finding of a thrombus in the descending thoracic aorta. Need to work that up as alluded by Dr. Carles Collet.  Need to determine if pt has a PFO (needs echo with bubble study) or underlying clotting disorder.  I think patient will benefit from outpt hematology consultation. She will need  extensive workup for her VTE.    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 - need sleep study as an outpt. pls schedule a lab sleep study on day of discharge.  - needs PFT, ABG as outpt as well.     Family : Patient updated the plan at bedside.  PCCM will peripherally follow pt.  Pls call back if with issues or concerns. Thank you!   J. Shirl Harris, MD 05/14/2016, 10:07 PM [facility] Pager [phone] After 3 pm or if no answer, call [phone]

## 2016-05-14 NOTE — Progress Notes (Signed)
VASCULAR LAB PRELIMINARY  PRELIMINARY  PRELIMINARY  PRELIMINARY  Bilateral lower extremity venous duplex completed.    Preliminary report: Bilateral - Inconclusive study due to morbid obesity. Unable to visualize all segments of the veins. No obvious evidence of DVT noted in the areas which could be visualized. The thrombus noted in the left saphenofemoral junction  on a previous exam at another facility appears to have resolved. Not obvious evidence of a superficial thrombus was noted in the greater saphenous vein bilaterally.  Jaliyah Fotheringham, RVS 05/14/2016, 1:08 PM

## 2016-05-14 NOTE — Progress Notes (Addendum)
ANTICOAGULATION CONSULT NOTE - f/u Consult  Pharmacy Consult for heparin Indication: PE with aortic thrombi  Allergies  Allergen Reactions  . Penicillins Hives    Has patient had a PCN reaction causing immediate rash, facial/tongue/throat swelling, SOB or lightheadedness with hypotension: No Has patient had a PCN reaction causing severe rash involving mucus membranes or skin necrosis: No Has patient had a PCN reaction that required hospitalization No Has patient had a PCN reaction occurring within the last 10 years: No If all of the above answers are "NO", then may proceed with Cephalosporin use.    Patient Measurements: Listed height: 64 in Listed weight 193.4 kg Heparin Dosing Weight: 106 kg  Vital Signs: Temp: 98.3 F (36.8 C) (02/19 1926) Temp Source: Oral (02/19 1926) BP: 116/59 (02/19 2000) Pulse Rate: 95 (02/19 2000)  Labs:  Recent Labs  05/12/16 1229 05/12/16 1330 05/14/16 1056 05/14/16 2222  HGB 13.0  --  12.9  --   HCT 41.2  --  43.4  --   PLT  --   --  309  --   LABPROT  --   --  15.1  --   INR  --   --  1.18  --   HEPARINUNFRC  --   --   --  <0.10*  CREATININE  --  1.19* 1.15*  --   CKTOTAL  --  49  --   --   TROPONINI  --   --   --  0.04*    Estimated Creatinine Clearance (by C-G formula based on SCr of 1.15 mg/dL (H)) Female: 116.2 mL/min Female: 140.1 mL/min   Medical History: Past Medical History:  Diagnosis Date  . Gestational diabetes   . Hypertension     Medications:  Scheduled:  . [START ON 05/15/2016] ferrous sulfate  325 mg Oral Q breakfast  . heparin  3,000 Units Intravenous Once  . [START ON 05/15/2016] losartan  100 mg Oral Daily   And  . [START ON 05/15/2016] hydrochlorothiazide  12.5 mg Oral Daily  . [START ON 05/15/2016] insulin aspart  0-20 Units Subcutaneous TID WC  . insulin aspart  0-5 Units Subcutaneous QHS  . potassium chloride  40 mEq Oral BID  . sodium chloride      . sodium chloride flush  3 mL Intravenous Q12H     Assessment: Pharmacy is consulted to dose heparin in 41 yo female diagnosed with PE as well as incidental aortic thrombi.  Not on any anticoagulants PTA. Pt was previously on Xarelto previously, but was not on med at current time.   Today, 05/14/16  Baseline hgb 12.9, plt 309  SCr 1.15  PT 15.1, INR 1.18  2222 HL=<0.10, no infusion issues, pink tinge to urine but no other bleeding issues per RN  Goal of Therapy:  Heparin level 0.3-0.7 units/ml Monitor platelets by anticoagulation protocol: Yes   Plan:   Rebolus with 3000 unit IV heparin x1  Increase heparin drip to 2200 units/hr  Heparin level 6 hours after start of infusion  Monitor for signs and symptoms of bleeding    Dorrene German 05/14/2016 11:20 PM

## 2016-05-14 NOTE — ED Provider Notes (Signed)
[facility] DEPT Provider Note   CSN: [id number] Arrival date & time: 05/14/16  1023     History   Chief Complaint Chief Complaint  Patient presents with  . Shortness of Breath    HPI Alexa [name] is a 41 y.o. female.  Patient referred in from [facility] urgent care. Patient was seen there on Friday for shortness of breath and bilateral leg swelling. Patient had a d-dimer drawn resulted today and she was called and told it was elevated. Patient's had some increased discomfort shortness of breath and some left-sided chest discomfort. Not severe. Patient's not normally on oxygen at home. Patient feels that her left leg is more swollen than the right. Several years ago patient was on a blood thinner for 3 months of reportedly for a blood clot in her leg. Never had a blood clot in her lungs.      Past Medical History:  Diagnosis Date  . Gestational diabetes   . Hypertension     Patient Active Problem List   Diagnosis Date Noted  . Acute pulmonary embolus ([place]) 05/14/2016  . Acute respiratory failure with hypoxia ([place]) 05/14/2016  . Hypoxia   . Class 3 obesity due to excess calories with serious comorbidity and body mass index (BMI) greater than or equal to 70 in adult ([place]) 04/15/2016  . Athlete's foot 10/08/2015  . Diabetes ([place]) 06/22/2015  . Benign essential HTN 06/22/2015  . 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 BMI 70 and over, adult ([place]) 06/22/2015  . Vitamin D deficiency 06/22/2015  . Iron deficiency anemia 06/22/2015    Past Surgical History:  Procedure Laterality Date  . CESAREAN SECTION      OB History    No data available       Home Medications    Prior to Admission medications   Medication Sig Start Date End Date Taking? Authorizing Provider  ferrous sulfate 325 (65 FE) MG tablet Take 325 mg by mouth daily with breakfast.   Yes Historical Provider, MD  furosemide (LASIX) 40 MG tablet Take 1 tablet (40 mg total) by mouth daily. 01/28/16   Yes Elizabeth Whitney McVey, PA-C  losartan-hydrochlorothiazide (HYZAAR) 100-12.5 MG tablet Take 1 tablet by mouth daily. 04/23/16  Yes [place], MD  metFORMIN (GLUCOPHAGE) 500 MG tablet Take 1 tablet (500 mg total) by mouth 2 (two) times daily with a meal. 04/23/16  Yes Horald Pollen, MD  nystatin (MYCOSTATIN/NYSTOP) powder Apply topically 4 (four) times daily. 03/24/16  Yes Shawnee Knapp, MD    Family History Family History  Problem Relation Age of Onset  . Diabetes Maternal Grandmother   . Hypertension Mother     Social History Social History  Substance Use Topics  . Smoking status: Current Some Day Smoker    Types: Cigarettes  . Smokeless tobacco: Never Used     Comment: only smokes when she drinks alcohol - 1 pack per week  . Alcohol use 2.4 - 3.6 oz/week    4 - 6 Standard drinks or equivalent per week     Comment: 13-4 times/weeks     Allergies   Penicillins   Review of Systems Review of Systems  Constitutional: Negative for fever.  HENT: Negative for congestion.   Eyes: Negative for visual disturbance.  Respiratory: Positive for shortness of breath.   Cardiovascular: Positive for chest pain and leg swelling.  Gastrointestinal: Negative for abdominal pain.  Genitourinary: Negative for dysuria.  Musculoskeletal: Negative for back pain.  Allergic/Immunologic:  Negative for food allergies.  Neurological: Negative for syncope.  Hematological: Does not bruise/bleed easily.  Psychiatric/Behavioral: Negative for confusion.     Physical Exam Updated Vital Signs BP 140/62 (BP Location: Left Wrist)   Pulse 92   Temp 98.2 F (36.8 C) (Oral)   Resp 21   LMP 05/01/2016 (Exact Date)   SpO2 96%   Physical Exam  Constitutional: She is oriented to person, place, and time. She appears well-developed and well-nourished. She appears distressed.  HENT:  Head: Normocephalic and atraumatic.  Mouth/Throat: Oropharynx is clear and moist.  Eyes: Conjunctivae  and EOM are normal. Pupils are equal, round, and reactive to light.  Neck: Normal range of motion. Neck supple.  Cardiovascular:  Tachycardia  Pulmonary/Chest: Breath sounds normal. She has no wheezes.  Abdominal: Soft. Bowel sounds are normal. There is no tenderness.  Musculoskeletal: Normal range of motion. She exhibits edema.  Marketed bilateral leg swelling  Neurological: She is alert and oriented to person, place, and time.  Skin: Skin is warm.  Nursing note and vitals reviewed.    ED Treatments / Results  Labs (all labs ordered are listed, but only abnormal results are displayed) Labs Reviewed  CBC WITH DIFFERENTIAL/PLATELET - Abnormal; Notable for the following:       Result Value   WBC 15.0 (*)    RBC 5.13 (*)    MCH 25.1 (*)    MCHC 29.7 (*)    RDW 19.1 (*)    Neutro Abs 11.8 (*)    Monocytes Absolute 1.7 (*)    All other components within normal limits  BASIC METABOLIC PANEL - Abnormal; Notable for the following:    Potassium 3.1 (*)    Chloride 93 (*)    CO2 34 (*)    Glucose, Bld 103 (*)    Creatinine, Ser 1.15 (*)    Calcium 8.3 (*)    GFR calc non Af Amer 60 (*)    All other components within normal limits  URINE CULTURE  PROTIME-INR  HIV ANTIBODY (ROUTINE TESTING)  URINALYSIS, ROUTINE W REFLEX MICROSCOPIC  LACTIC ACID, PLASMA  LACTIC ACID, PLASMA  FACTOR 5 LEIDEN  LUPUS ANTICOAGULANT PANEL  PROTHROMBIN GENE MUTATION    EKG  EKG Interpretation  Date/Time:  Monday May 14 2016 10:52:14 EST Ventricular Rate:  102 PR Interval:    QRS Duration: 85 QT Interval:  340 QTC Calculation: 443 R Axis:   103 Text Interpretation:  Sinus tachycardia Borderline right axis deviation Borderline T abnormalities, inferior leads Confirmed by Myangel Summons  MD, Fitzroy Mikami ([id number]) on 05/14/2016 11:43:50 AM       Radiology Ct Angio Chest Pe W Or Wo Contrast  Result Date: 05/14/2016 CLINICAL DATA:  Shortness of breath. Bilateral leg swelling. On Xarelto. EXAM: CT  ANGIOGRAPHY CHEST WITH CONTRAST TECHNIQUE: Multidetector CT imaging of the chest was performed using the standard protocol during bolus administration of intravenous contrast. Multiplanar CT image reconstructions and MIPs were obtained to evaluate the vascular anatomy. CONTRAST:  100 mL Isovue 370 COMPARISON:  Chest radiograph 05/14/2016 FINDINGS: Cardiovascular: There is nonocclusive embolus in the distal right main pulmonary artery extending into the right upper lobe, interlobar, and right lower lobe arteries. There is also evidence of segmental and subsegmental emboli in the right upper and lower lobes, although evaluation is limited by patient body habitus and motion artifact. No central left-sided emboli are identified. Evaluation of left lower lobe segmental and subsegmental vessels is limited by motion. There is flattening of the interventricular septum.  The right ventricle to left ventricle ratio is abnormally elevated at 1.26. The main pulmonary artery is enlarged, measuring 4.2 cm in diameter. The heart is mildly enlarged. No pericardial effusion. The thoracic aorta is normal in caliber, however there is prominent thrombus within the predominantly distal descending thoracic aorta extending into the proximal abdominal aorta, incompletely visualized. Mediastinum/Nodes: No enlarged axillary, mediastinal, or hilar lymph nodes. Lungs/Pleura: No pleural effusion or pneumothorax. Evaluation of the lungs is limited by motion artifact. There is a small amount of patchy opacity in the basilar lower lobes bilaterally, greater on the left. Upper Abdomen: Partially visualized aortic thrombus as described above. Musculoskeletal: No acute osseous abnormality or suspicious osseous lesion. Review of the MIP images confirms the above findings. IMPRESSION: 1. Positive for acute PE with CT evidence of right heart strain (RV/LV Ratio = 1.26) consistent with at least submassive (intermediate risk) PE. The presence of right  heart strain has been associated with an increased risk of morbidity and mortality. Given the main pulmonary artery enlargement, there may be a component of underlying chronic pulmonary arterial hypertension contributing to the right ventricular enlargement. 2. Thrombus in the distal descending thoracic aorta and proximal abdominal aorta, incompletely visualized. CTA of the abdomen and pelvis is recommended to further assess the extent of thrombus and patency of the visceral arteries. 3. Mild bibasilar lung opacities which may reflect atelectasis, infection, or early infarcts. Critical Value/emergent results were called by telephone at the time of interpretation on 05/14/2016 at 2:54 pm to Dr. Fredia Sorrow , who verbally acknowledged these results. Electronically Signed   By: Logan Bores M.D.   On: 05/14/2016 15:10   Dg Chest Port 1 View  Result Date: 05/14/2016 CLINICAL DATA:  Left leg pain. Shortness of breath for the past week. History of hypertension, diabetes, current smoker. EXAM: PORTABLE CHEST 1 VIEW COMPARISON:  Chest x-ray of December 30, 2015 FINDINGS: The lungs are well-expanded. The interstitial markings are increased. The pulmonary vascularity is engorged. The cardiac silhouette is mildly enlarged. There is no pleural effusion. There is no alveolar infiltrate. The mediastinum is normal in width. IMPRESSION: Findings consistent with miles pulmonary edema likely of cardiac cause. No alveolar pneumonia. When the patient can tolerate the procedure, a PA and lateral chest x-ray would be useful. Electronically Signed   By: David  Martinique M.D.   On: 05/14/2016 13:06    Procedures Procedures (including critical care time)  CRITICAL CARE Performed by: Fredia Sorrow Total critical care time: 30 minutes Critical care time was exclusive of separately billable procedures and treating other patients. Critical care was necessary to treat or prevent imminent or life-threatening deterioration. Critical  care was time spent personally by me on the following activities: development of treatment plan with patient and/or surrogate as well as nursing, discussions with consultants, evaluation of patient's response to treatment, examination of patient, obtaining history from patient or surrogate, ordering and performing treatments and interventions, ordering and review of laboratory studies, ordering and review of radiographic studies, pulse oximetry and re-evaluation of patient's condition.  Medications Ordered in ED Medications  sodium chloride 0.9 % injection (not administered)  heparin ADULT infusion 100 units/mL (25000 units/218mL sodium chloride 0.45%) (1,700 Units/hr Intravenous New Bag/Given 05/14/16 1538)  acetaminophen (TYLENOL) tablet 650 mg (not administered)    Or  acetaminophen (TYLENOL) suppository 650 mg (not administered)  ondansetron (ZOFRAN) tablet 4 mg (not administered)    Or  ondansetron (ZOFRAN) injection 4 mg (not administered)  iopamidol (ISOVUE-370) 76 % injection (100 mLs  Contrast Given 05/14/16 1417)  heparin bolus via infusion 5,000 Units (5,000 Units Intravenous Bolus from Bag 05/14/16 1539)     Initial Impression / Assessment and Plan / ED Course  I have reviewed the triage vital signs and the nursing notes.  Pertinent labs & imaging results that were available during my care of the patient were reviewed by me and considered in my medical decision making (see chart for details).    Patient with concern for elevated d-dimer and hypoxia. CT scan angios chest showed significant PE with some right heart strain. Not meeting criteria for code PE. Consult placed to critical care as well as hospitalist. In addition the unusual finding was blood clots in the aorta. Patient's hypoxia is stable on nasal cannula. Patient still with some tachycardia. Patient with a history of superficial proximal venous blood clot in the leg in the past and was on blood thinners for 3 years.  Otherwise no known history of pulmonary embolus. Patient started on heparin because of these findings.   Final Clinical Impressions(s) / ED Diagnoses   Final diagnoses:  Hypoxia  SOB (shortness of breath)  Other acute pulmonary embolism with acute cor pulmonale (HCC)    New Prescriptions New Prescriptions   No medications on file     Fredia Sorrow, MD 05/14/16 1655

## 2016-05-14 NOTE — H&P (Signed)
History and Physical  Alexa [name] [id number] DOB: 24-May-1975 DOA: 05/14/2016   PCP: Pcp Not In System   Patient coming from: Home  Chief Complaint: dyspnea, cough  HPI:  Alexa [name] is a 41 y.o. female with medical history of hypertension and diabetes mellitus presented with a 5 day history of shortness of breath that began on 05/10/2015. She began developing nonproductive cough on 05/12/2015. The patient denies any fevers, chills, sore throat, chest pain,hemoptysis, nausea, vomiting, diarrhea. She presented to the urgent care on 05/13/2015. Apparently, the patient was initially hypoxemic, but after evaluation at the urgent care, her oxygen saturations were back up into the mid 90s. Influenza was checked and negative. D-dimer was checked, but results are pending over the weekend. When the results of the d-dimer returned elevated, the patient was contacted to go to the emergency department for further evaluation. The patient continued to have shortness of breath and coughing through 05/12/2016 and 05/13/2016. However she denied any vomiting, abdominal pain, dysuria, hematuria, hemoptysis, hematochezia, dysuria, hematuria. The patient had a superficial thrombus in the saphenous vein in 05/31/2015. The patient was given 3 months of rivaroxaban at that time which she finished.   In the emergency department, the patient was initially hypoxemic with oxygen saturation of 78% on room air. She was stabilized on 3 L with oxygen saturation 98%. Chest x-ray showed increased interstitial markings without distinct infiltrate. CT angiogram of the chest was obtained and showed pulmonary emboli in the distal right pulmonary artery and segmental emboli in the right upper lobe and right lower lobe segmental arteries. In addition, thrombus was noted in the patient's descending thoracic aorta. The patient was started on heparin drip. The patient remained hemodynamically stable with heart rate in the upper  90s. BMP showed potassium 3.1. WBC was 15.0. EKG shows sinus tachycardia with nonspecific T-wave changes and right axis deviation. PCCM was consulted because there was evidence of possible right heart strain.  Assessment/Plan: Acute respiratory failure with hypoxia -Secondary to submassive PE -Presently stable on 3 L -Wean oxygen for saturation greater than 92% -Pulmonary hygiene  Acute Submassive pulmonary embolus -Continue heparin drip -Echocardiogram -PCCM consulted -Main pulmonary arteries enlarged representing a component of pulmonary hypertension -Appears unprovoked by clinical history -check factor V Leiden, prothrombin gene mutation, and Lupus anticoagulant  Aortic thrombi -Incidental finding of thrombus in the distal descending aorta -May need CT angiogram of abdomen  Lower extremity edema -Difficult to assess fluid status given the patient's body habitus -Check urine protein to creatinine ratio -05/14/2016 Venous duplex lower extremities inconclusive secondary to patient's body habitus  Hypertension -Holding Lasix as the patient received intravenous dye today -Continue lisinopril/HCTZ  Leukocytosis -Likely stress demargination -UA and urine culture -Blood cultures 2 sets  Diabetes mellitus type 2 -05/12/2016 Hemoglobin A1c  6.2 -Holding metformin -NovoLog sliding scale          Past Medical History:  Diagnosis Date  . Gestational diabetes   . Hypertension    Past Surgical History:  Procedure Laterality Date  . CESAREAN SECTION     Social History:  reports that she has been smoking Cigarettes.  She has never used smokeless tobacco. She reports that she drinks about 2.4 - 3.6 oz of alcohol per week . She reports that she does not use drugs.   Family History  Problem Relation Age of Onset  . Diabetes Maternal Grandmother   . Hypertension Mother      Allergies  Allergen Reactions  .  Penicillins Hives    Has patient had a PCN reaction  causing immediate rash, facial/tongue/throat swelling, SOB or lightheadedness with hypotension: No Has patient had a PCN reaction causing severe rash involving mucus membranes or skin necrosis: No Has patient had a PCN reaction that required hospitalization No Has patient had a PCN reaction occurring within the last 10 years: No If all of the above answers are "NO", then may proceed with Cephalosporin use.     Prior to Admission medications   Medication Sig Start Date End Date Taking? Authorizing Provider  ferrous sulfate 325 (65 FE) MG tablet Take 325 mg by mouth daily with breakfast.   Yes Historical Provider, MD  furosemide (LASIX) 40 MG tablet Take 1 tablet (40 mg total) by mouth daily. 01/28/16  Yes Elizabeth Whitney McVey, PA-C  losartan-hydrochlorothiazide (HYZAAR) 100-12.5 MG tablet Take 1 tablet by mouth daily. 04/23/16  Yes [place], MD  metFORMIN (GLUCOPHAGE) 500 MG tablet Take 1 tablet (500 mg total) by mouth 2 (two) times daily with a meal. 04/23/16  Yes Horald Pollen, MD  nystatin (MYCOSTATIN/NYSTOP) powder Apply topically 4 (four) times daily. 03/24/16  Yes Shawnee Knapp, MD    Review of Systems:  Constitutional:  No weight loss, night sweats, Fevers, chills, fatigue.  Head&Eyes: No headache.  No vision loss.  No eye pain or scotoma ENT:  No Difficulty swallowing,Tooth/dental problems,Sore throat,  No ear ache, post nasal drip,  Cardio-vascular:  No chest pain, Orthopnea, PND, swelling in lower extremities,  dizziness, palpitations  GI:  No  abdominal pain, nausea, vomiting, diarrhea, loss of appetite, hematochezia, melena, heartburn, indigestion, Resp:   No coughing up of blood .No wheezing.No chest wall deformity  Skin:  no rash or lesions.  GU:  no dysuria, change in color of urine, no urgency or frequency. No flank pain.  Musculoskeletal:  No joint pain or swelling. No decreased range of motion. No back pain.  Psych:  No change in mood or affect.  No depression or anxiety. Neurologic: No headache, no dysesthesia, no focal weakness, no vision loss. No syncope  Physical Exam: Vitals:   05/14/16 1330 05/14/16 1400 05/14/16 1530 05/14/16 1557  BP: 148/96 138/79  140/62  Pulse: 95 99 95 92  Resp: 25 (!) 29 25 21   Temp:      TempSrc:      SpO2: 95% 93% 100% 96%   General:  A&O x 3, NAD, nontoxic, pleasant/cooperative Head/Eye: No conjunctival hemorrhage, no icterus, [place]/AT, No nystagmus ENT:  No icterus,  No thrush, good dentition, no pharyngeal exudate Neck:  No masses, no lymphadenpathy, no bruits CV:  RRR, no rub, no gallop, no S3 Lung:  CTAB, good air movement, no wheeze, no rhonchi Abdomen: soft/NT, +BS, nondistended, no peritoneal signs Ext: No cyanosis, No rashes, No petechiae, No lymphangitis, No edema Neuro: CNII-XII intact, strength 4/5 in bilateral upper and lower extremities, no dysmetria  Labs on Admission:  Basic Metabolic Panel:  Recent Labs Lab 05/12/16 1330 05/14/16 1056  NA 140 136  K 3.6 3.1*  CL 93* 93*  CO2 32* 34*  GLUCOSE 118* 103*  BUN 19 17  CREATININE 1.19* 1.15*  CALCIUM 8.5* 8.3*   Liver Function Tests:  Recent Labs Lab 05/12/16 1330  AST 32  ALT 30  ALKPHOS 79  BILITOT 0.8  PROT 6.6  ALBUMIN 3.4*   No results for input(s): LIPASE, AMYLASE in the last 168 hours. No results for input(s): AMMONIA in the last 168 hours.  CBC:  Recent Labs Lab 05/12/16 1229 05/14/16 1056  WBC 11.6* 15.0*  NEUTROABS  --  11.8*  HGB 13.0 12.9  HCT 41.2 43.4  MCV 82.4 84.6  PLT  --  309   Coagulation Profile:  Recent Labs Lab 05/14/16 1056  INR 1.18   Cardiac Enzymes:  Recent Labs Lab 05/12/16 1330  CKTOTAL 49   BNP: Invalid input(s): POCBNP CBG: No results for input(s): GLUCAP in the last 168 hours. Urine analysis:    Component Value Date/Time   COLORURINE YELLOW 04/25/2012 1908   APPEARANCEUR CLOUDY (A) 04/25/2012 1908   LABSPEC 1.015 04/25/2012 1908   PHURINE 6.0  04/25/2012 1908   GLUCOSEU NEGATIVE 04/25/2012 1908   HGBUR LARGE (A) 04/25/2012 1908   BILIRUBINUR negative 12/30/2015 1212   KETONESUR trace (5) (A) 12/30/2015 1212   KETONESUR NEGATIVE 04/25/2012 1908   PROTEINUR negative 12/30/2015 1212   PROTEINUR NEGATIVE 04/25/2012 1908   UROBILINOGEN 2.0 12/30/2015 1212   UROBILINOGEN 1.0 04/25/2012 1908   NITRITE Negative 12/30/2015 1212   NITRITE NEGATIVE 04/25/2012 1908   LEUKOCYTESUR Negative 12/30/2015 1212   Sepsis Labs: @LABRCNTIP (procalcitonin:4,lacticidven:4) )No results found for this or any previous visit (from the past 240 hour(s)).   Radiological Exams on Admission: Ct Angio Chest Pe W Or Wo Contrast  Result Date: 05/14/2016 CLINICAL DATA:  Shortness of breath. Bilateral leg swelling. On Xarelto. EXAM: CT ANGIOGRAPHY CHEST WITH CONTRAST TECHNIQUE: Multidetector CT imaging of the chest was performed using the standard protocol during bolus administration of intravenous contrast. Multiplanar CT image reconstructions and MIPs were obtained to evaluate the vascular anatomy. CONTRAST:  100 mL Isovue 370 COMPARISON:  Chest radiograph 05/14/2016 FINDINGS: Cardiovascular: There is nonocclusive embolus in the distal right main pulmonary artery extending into the right upper lobe, interlobar, and right lower lobe arteries. There is also evidence of segmental and subsegmental emboli in the right upper and lower lobes, although evaluation is limited by patient body habitus and motion artifact. No central left-sided emboli are identified. Evaluation of left lower lobe segmental and subsegmental vessels is limited by motion. There is flattening of the interventricular septum. The right ventricle to left ventricle ratio is abnormally elevated at 1.26. The main pulmonary artery is enlarged, measuring 4.2 cm in diameter. The heart is mildly enlarged. No pericardial effusion. The thoracic aorta is normal in caliber, however there is prominent thrombus within  the predominantly distal descending thoracic aorta extending into the proximal abdominal aorta, incompletely visualized. Mediastinum/Nodes: No enlarged axillary, mediastinal, or hilar lymph nodes. Lungs/Pleura: No pleural effusion or pneumothorax. Evaluation of the lungs is limited by motion artifact. There is a small amount of patchy opacity in the basilar lower lobes bilaterally, greater on the left. Upper Abdomen: Partially visualized aortic thrombus as described above. Musculoskeletal: No acute osseous abnormality or suspicious osseous lesion. Review of the MIP images confirms the above findings. IMPRESSION: 1. Positive for acute PE with CT evidence of right heart strain (RV/LV Ratio = 1.26) consistent with at least submassive (intermediate risk) PE. The presence of right heart strain has been associated with an increased risk of morbidity and mortality. Given the main pulmonary artery enlargement, there may be a component of underlying chronic pulmonary arterial hypertension contributing to the right ventricular enlargement. 2. Thrombus in the distal descending thoracic aorta and proximal abdominal aorta, incompletely visualized. CTA of the abdomen and pelvis is recommended to further assess the extent of thrombus and patency of the visceral arteries. 3. Mild bibasilar lung opacities which may reflect  atelectasis, infection, or early infarcts. Critical Value/emergent results were called by telephone at the time of interpretation on 05/14/2016 at 2:54 pm to Dr. Fredia Sorrow , who verbally acknowledged these results. Electronically Signed   By: Logan Bores M.D.   On: 05/14/2016 15:10   Dg Chest Port 1 View  Result Date: 05/14/2016 CLINICAL DATA:  Left leg pain. Shortness of breath for the past week. History of hypertension, diabetes, current smoker. EXAM: PORTABLE CHEST 1 VIEW COMPARISON:  Chest x-ray of December 30, 2015 FINDINGS: The lungs are well-expanded. The interstitial markings are increased. The  pulmonary vascularity is engorged. The cardiac silhouette is mildly enlarged. There is no pleural effusion. There is no alveolar infiltrate. The mediastinum is normal in width. IMPRESSION: Findings consistent with miles pulmonary edema likely of cardiac cause. No alveolar pneumonia. When the patient can tolerate the procedure, a PA and lateral chest x-ray would be useful. Electronically Signed   By: Philander Ake  [place] M.D.   On: 05/14/2016 13:06    EKG: Independently reviewed. Sinus tachycardia, right axis deviation, nonspecific T wave changes    Time spent:60 minutes Code Status:   FULL Family Communication:  No Family at bedside Disposition Plan: expect 2-3 day hospitalization Consults called: PCCM by EDP DVT Prophylaxis: IV heparin  Yeshaya Vath, DO  [facility] Pager [phone]  If 7PM-7AM, please contact night-coverage [email or web address] Password [email or web address] 05/14/2016, 4:16 PM

## 2016-05-14 NOTE — ED Triage Notes (Addendum)
Pt reports shortness of breath and bilateral legs swelling, sts on Xeralto as preventiof treatment for DVT . Pt is hypoxic in triage .  denies chest pain . Hx obesity. Hx HTN . Alert and oriented x 4.

## 2016-05-14 NOTE — ED Notes (Signed)
Patient transported to CT 

## 2016-05-14 NOTE — Telephone Encounter (Signed)
Pt advised to go to ED for further evaluation of elevated D-dimer  Pt is on her way to [place]

## 2016-05-15 ENCOUNTER — Inpatient Hospital Stay (HOSPITAL_COMMUNITY): Payer: BLUE CROSS/BLUE SHIELD

## 2016-05-15 ENCOUNTER — Encounter (HOSPITAL_COMMUNITY): Payer: Self-pay | Admitting: Radiology

## 2016-05-15 DIAGNOSIS — I2699 Other pulmonary embolism without acute cor pulmonale: Secondary | ICD-10-CM

## 2016-05-15 LAB — HEPARIN LEVEL (UNFRACTIONATED): Heparin Unfractionated: 0.2 [IU]/mL — ABNORMAL LOW (ref 0.30–0.70)

## 2016-05-15 LAB — GLUCOSE, CAPILLARY
GLUCOSE-CAPILLARY: 113 mg/dL — AB (ref 65–99)
GLUCOSE-CAPILLARY: 109 mg/dL — AB (ref 65–99)
Glucose-Capillary: 98 mg/dL (ref 65–99)
Glucose-Capillary: 96 mg/dL (ref 65–99)

## 2016-05-15 LAB — BASIC METABOLIC PANEL
ANION GAP: 12 (ref 5–15)
BUN: 16 mg/dL (ref 6–20)
CALCIUM: 8.3 mg/dL — AB (ref 8.9–10.3)
CO2: 33 mmol/L — AB (ref 22–32)
Chloride: 94 mmol/L — ABNORMAL LOW (ref 101–111)
Creatinine, Ser: 1.06 mg/dL — ABNORMAL HIGH (ref 0.44–1.00)
GFR calc non Af Amer: >60 mL/min (ref >60)
Glucose, Bld: 122 mg/dL — ABNORMAL HIGH (ref 65–99)
POTASSIUM: 3.6 mmol/L (ref 3.5–5.1)
Sodium: 139 mmol/L (ref 135–145)

## 2016-05-15 LAB — ECHOCARDIOGRAM COMPLETE BUBBLE STUDY
EERAT: 5.82
EWDT: 187 ms
FS: 42 % (ref 28–44)
IV/PV OW: 1.09
LA diam end sys: 34 mm
LA diam index: 1.25 cm/m2
LA vol A4C: 43.2 ml
LA vol index: 15.9 mL/m2
LA vol: 43 mL
LASIZE: 34 mm
LV E/e'average: 5.82
LVEEMED: 5.82
LVELAT: 15.4 cm/s
LVOT area: 3.14 cm2
LVOT diameter: 20 mm
MV Dec: 187
MV Peak grad: 3 mmHg
MVPKAVEL: 59.7 m/s
MVPKEVEL: 89.7 m/s
PW: 10.4 mm — AB (ref 0.6–1.1)
Reg peak vel: 321 cm/s
TDI e' lateral: 15.4
TDI e' medial: 8.05
TR max vel: 321 cm/s

## 2016-05-15 LAB — CBC
HEMATOCRIT: 42 % (ref 36.0–46.0)
HEMOGLOBIN: 12.2 g/dL (ref 12.0–15.0)
MCH: 25.1 pg — ABNORMAL LOW (ref 26.0–34.0)
MCHC: 29 g/dL — AB (ref 30.0–36.0)
MCV: 86.4 fL (ref 78.0–100.0)
Platelets: 298 10*3/uL (ref 150–400)
RBC: 4.86 MIL/uL (ref 3.87–5.11)
RDW: 19.2 % — ABNORMAL HIGH (ref 11.5–15.5)
WBC: 13.6 10*3/uL — ABNORMAL HIGH (ref 4.0–10.5)

## 2016-05-15 LAB — PROTEIN / CREATININE RATIO, URINE
Creatinine, Urine: 144.43 mg/dL
PROTEIN CREATININE RATIO: 0.13 mg/mg{creat} (ref 0.00–0.15)
TOTAL PROTEIN, URINE: 19 mg/dL

## 2016-05-15 LAB — TROPONIN I
TROPONIN I: 0.03 ng/mL — AB (ref <0.03)
TROPONIN I: 0.03 ng/mL — AB (ref <0.03)

## 2016-05-15 LAB — PREGNANCY, URINE: Preg Test, Ur: NEGATIVE

## 2016-05-15 LAB — MAGNESIUM: MAGNESIUM: 2 mg/dL (ref 1.7–2.4)

## 2016-05-15 MED ORDER — SODIUM CHLORIDE 0.9 % IJ SOLN
INTRAMUSCULAR | Status: AC
  Filled 2016-05-15: qty 50

## 2016-05-15 MED ORDER — PERFLUTREN LIPID MICROSPHERE
INTRAVENOUS | Status: AC
  Filled 2016-05-15: qty 10

## 2016-05-15 MED ORDER — IOPAMIDOL (ISOVUE-370) INJECTION 76%
100.0000 mL | Freq: Once | INTRAVENOUS | Status: AC | PRN
  Administered 2016-05-14 – 2016-05-15 (×2): 100 mL via INTRAVENOUS

## 2016-05-15 MED ORDER — HEPARIN (PORCINE) IN NACL 100-0.45 UNIT/ML-% IJ SOLN
2600.0000 [IU]/h | INTRAMUSCULAR | Status: DC
  Filled 2016-05-15: qty 250

## 2016-05-15 MED ORDER — PERFLUTREN LIPID MICROSPHERE
1.0000 mL | INTRAVENOUS | Status: AC | PRN
  Administered 2016-05-15: 2 mL via INTRAVENOUS
  Filled 2016-05-15: qty 10

## 2016-05-15 MED ORDER — IOPAMIDOL (ISOVUE-370) INJECTION 76%
INTRAVENOUS | Status: AC
Start: 2016-05-15 — End: 2016-05-15
  Filled 2016-05-15: qty 100

## 2016-05-15 MED ORDER — FUROSEMIDE 40 MG PO TABS
40.0000 mg | ORAL_TABLET | Freq: Every day | ORAL | Status: DC
  Administered 2016-05-16 – 2016-05-18 (×3): 40 mg via ORAL
  Filled 2016-05-15 (×3): qty 1

## 2016-05-15 MED ORDER — RIVAROXABAN 15 MG PO TABS
15.0000 mg | ORAL_TABLET | Freq: Two times a day (BID) | ORAL | Status: DC
  Administered 2016-05-15 – 2016-05-16 (×3): 15 mg via ORAL
  Filled 2016-05-15 (×3): qty 1

## 2016-05-15 MED ORDER — RIVAROXABAN 20 MG PO TABS: 20.0000 mg | ORAL_TABLET | Freq: Every day | ORAL | Status: DC

## 2016-05-15 NOTE — Progress Notes (Signed)
RN called RT about patients sats going down in the 80's when she sleeps. RT placed patient on 50% venti mask at 12 liters. Patient is a mouth breather. RT will continue to monitor

## 2016-05-15 NOTE — Progress Notes (Signed)
Pt displaying signs of sleep apnea. Desating to mid 45s on venturi mask while asleep. Comes back up to mid to high 90s when awake. Baltazar Najjar was notified in person. I asked if she would like to order CPAP. She said to try NRB face mask first so a NRB mask was applied. Will continue to assess and monitor.

## 2016-05-15 NOTE — Care Management Note (Signed)
Case Management Note  Patient Details  Name: Alexa [name] MRN: [id number] Date of Birth: [birth year]  Subjective/Objective:         Confirmed P.E., dyspena           Action/Plan: Home  Date:  May 15, 2016 Chart reviewed for concurrent status and case management needs. Will continue to follow patient progress. Discharge Planning: following for needs Expected discharge date: [id number] Velva Harman, BSN, [place], [place] Expected Discharge Date:   (unknown)               Expected Discharge Plan:  Home/Self Care  In-House Referral:     Discharge planning Services     Post Acute Care Choice:    Choice offered to:     DME Arranged:    DME Agency:     HH Arranged:    [place] Agency:     Status of Service:  In process, will continue to follow  If discussed at Long Length of Stay Meetings, dates discussed:    Additional Comments:  Leeroy Cha, RN 05/15/2016, 10:40 AM

## 2016-05-15 NOTE — Progress Notes (Signed)
PROGRESS NOTE    Alexa CERMINARA  [id number]  DOB: 1976/02/07  DOA: 05/14/2016 PCP: Pcp Not In System Outpatient Specialists:   Hospital course: Alexa [name] is a 41 y.o. female with medical history of hypertension and diabetes mellitus presented with a 5 day history of shortness of breath that began on 05/10/2015.  The patient was initially hypoxemic with oxygen saturation of 78% on room air. She was stabilized on 3 L with oxygen saturation 98%. Chest x-ray showed increased interstitial markings without distinct infiltrate. CT angiogram of the chest was obtained and showed pulmonary emboli in the distal right pulmonary artery and segmental emboli in the right upper lobe and right lower lobe segmental arteries. In addition, thrombus was noted in the patient's descending thoracic aorta. The patient was started on heparin drip. The patient remained hemodynamically stable with heart rate in the upper 90s. BMP showed potassium 3.1. WBC was 15.0. EKG shows sinus tachycardia with nonspecific T-wave changes and right axis deviation. PCCM was consulted because there was evidence of possible right heart strain.  Assessment & Plan:   Acute respiratory failure with hypoxia -Secondary to submassive PE -Presently stable on 3 L -Wean oxygen for saturation greater than 92% -Pulmonary hygiene   Acute Submassive pulmonary embolus -Continue heparin drip -Echocardiogram with bubble study pending.  -PCCM consult appreciated: see recommendations -Main pulmonary arteries enlarged representing a component of pulmonary hypertension -Pt is very high risk for VTE -Outpatient hematology consult and coagulation work up recommended at discharge. -Pt likely will need to be anticoagulated indefinitely.  Plan to transition to Alexa [name] 2/20.  Pt has taken this in the past and tolerated it well and it is covered by her insurance plan. -Clinically has stabilized, transfer out of SDU 2/20.      Pulmonary  Edema - noted on CXR, will order 2 view study in AM, pt asymptomatic at this time.   Aortic thrombi -Incidental finding of thrombus in the distal descending aorta -Ordering CT angiogram of abdomen / pelvis to further define  Lower extremity edema -Difficult to assess fluid status given the patient's body habitus -Check urine protein to creatinine ratio -05/14/2016 Venous duplex lower extremities inconclusive secondary to patient's body habitus   Hypertension -Holding Lasix as the patient received intravenous dye today -BPs have been stable, well controlled and managed  Suspect OSA - Pt at high risk for this and needs outpatient sleep study scheduled at discharge, she declined to try CPAP last night.   Leukocytosis -Likely stress demargination -UA and urine culture -Blood cultures 2 sets  Diabetes mellitus type 2 -05/12/2016 Hemoglobin A1c  6.2 -Holding metformin -NovoLog sliding scale CBG (last 3)   Recent Labs  05/14/16 2135  GLUCAP 129*    DVT prophylaxis: heparin drip Code Status: full  Consultants:  PCCM  Subjective: Pt reports that she feels fine. She has been ambulating on her own to bathroom.  She did desat at night when sleeping raising concern for undiagnosed sleep apnea.  She is willing to try CPAP nightly in hospital.   Objective: Vitals:   05/15/16 0331 05/15/16 0400 05/15/16 0545 05/15/16 0600  BP:  127/72    Pulse:  97    Resp:  (!) 25    Temp: 98.9 F (37.2 C)     TempSrc: Oral     SpO2:  91% (!) 88% 99%  Weight:      Height:        Intake/Output Summary (Last 24 hours) at 05/15/16 0723 Last  data filed at 05/15/16 0400  Gross per 24 hour  Intake           719.06 ml  Output              200 ml  Net           519.06 ml   Filed Weights   05/14/16 1837  Weight: (!) 195.5 kg (431 lb)    Exam:  General exam: awake, alert, NAD, morbidly obese female in bed.  Respiratory system:  No increased work of breathing. Occasional exp  wheezes heard.  Cardiovascular system: S1 & S2 heard.  Gastrointestinal system: Abdomen is nondistended, soft and nontender. Normal bowel sounds heard. Central nervous system: Alert and oriented. No focal neurological deficits. Extremities: no cyanosis.  Data Reviewed: Basic Metabolic Panel:  Recent Labs Lab 05/12/16 1330 05/14/16 1056 05/15/16 0638  NA 140 136  --   K 3.6 3.1*  --   CL 93* 93*  --   CO2 32* 34*  --   GLUCOSE 118* 103*  --   BUN 19 17  --   CREATININE 1.19* 1.15*  --   CALCIUM 8.5* 8.3*  --   MG  --   --  2.0   Liver Function Tests:  Recent Labs Lab 05/12/16 1330  AST 32  ALT 30  ALKPHOS 79  BILITOT 0.8  PROT 6.6  ALBUMIN 3.4*   No results for input(s): LIPASE, AMYLASE in the last 168 hours. No results for input(s): AMMONIA in the last 168 hours. CBC:  Recent Labs Lab 05/12/16 1229 05/14/16 1056 05/15/16 0638  WBC 11.6* 15.0* 13.6*  NEUTROABS  --  11.8*  --   HGB 13.0 12.9 12.2  HCT 41.2 43.4 42.0  MCV 82.4 84.6 86.4  PLT  --  309 298   Cardiac Enzymes:  Recent Labs Lab 05/12/16 1330 05/14/16 2222  CKTOTAL 49  --   TROPONINI  --  0.04*   CBG (last 3)   Recent Labs  05/14/16 2135  GLUCAP 129*   Recent Results (from the past 240 hour(s))  MRSA PCR Screening     Status: None   Collection Time: 05/14/16  6:30 PM  Result Value Ref Range Status   MRSA by PCR NEGATIVE NEGATIVE Final    Comment:        The GeneXpert MRSA Assay (FDA approved for NASAL specimens only), is one component of a comprehensive MRSA colonization surveillance program. It is not intended to diagnose MRSA infection nor to guide or monitor treatment for MRSA infections.      Studies: Ct Angio Chest Pe W Or Wo Contrast  Result Date: 05/14/2016 CLINICAL DATA:  Shortness of breath. Bilateral leg swelling. On Xarelto. EXAM: CT ANGIOGRAPHY CHEST WITH CONTRAST TECHNIQUE: Multidetector CT imaging of the chest was performed using the standard protocol  during bolus administration of intravenous contrast. Multiplanar CT image reconstructions and MIPs were obtained to evaluate the vascular anatomy. CONTRAST:  100 mL Isovue 370 COMPARISON:  Chest radiograph 05/14/2016 FINDINGS: Cardiovascular: There is nonocclusive embolus in the distal right main pulmonary artery extending into the right upper lobe, interlobar, and right lower lobe arteries. There is also evidence of segmental and subsegmental emboli in the right upper and lower lobes, although evaluation is limited by patient body habitus and motion artifact. No central left-sided emboli are identified. Evaluation of left lower lobe segmental and subsegmental vessels is limited by motion. There is flattening of the interventricular septum. The right ventricle  to left ventricle ratio is abnormally elevated at 1.26. The main pulmonary artery is enlarged, measuring 4.2 cm in diameter. The heart is mildly enlarged. No pericardial effusion. The thoracic aorta is normal in caliber, however there is prominent thrombus within the predominantly distal descending thoracic aorta extending into the proximal abdominal aorta, incompletely visualized. Mediastinum/Nodes: No enlarged axillary, mediastinal, or hilar lymph nodes. Lungs/Pleura: No pleural effusion or pneumothorax. Evaluation of the lungs is limited by motion artifact. There is a small amount of patchy opacity in the basilar lower lobes bilaterally, greater on the left. Upper Abdomen: Partially visualized aortic thrombus as described above. Musculoskeletal: No acute osseous abnormality or suspicious osseous lesion. Review of the MIP images confirms the above findings. IMPRESSION: 1. Positive for acute PE with CT evidence of right heart strain (RV/LV Ratio = 1.26) consistent with at least submassive (intermediate risk) PE. The presence of right heart strain has been associated with an increased risk of morbidity and mortality. Given the main pulmonary artery  enlargement, there may be a component of underlying chronic pulmonary arterial hypertension contributing to the right ventricular enlargement. 2. Thrombus in the distal descending thoracic aorta and proximal abdominal aorta, incompletely visualized. CTA of the abdomen and pelvis is recommended to further assess the extent of thrombus and patency of the visceral arteries. 3. Mild bibasilar lung opacities which may reflect atelectasis, infection, or early infarcts. Critical Value/emergent results were called by telephone at the time of interpretation on 05/14/2016 at 2:54 pm to Dr. Fredia Sorrow , who verbally acknowledged these results. Electronically Signed   By: Logan Bores M.D.   On: 05/14/2016 15:10   Dg Chest Port 1 View  Result Date: 05/14/2016 CLINICAL DATA:  Left leg pain. Shortness of breath for the past week. History of hypertension, diabetes, current smoker. EXAM: PORTABLE CHEST 1 VIEW COMPARISON:  Chest x-ray of December 30, 2015 FINDINGS: The lungs are well-expanded. The interstitial markings are increased. The pulmonary vascularity is engorged. The cardiac silhouette is mildly enlarged. There is no pleural effusion. There is no alveolar infiltrate. The mediastinum is normal in width. IMPRESSION: Findings consistent with miles pulmonary edema likely of cardiac cause. No alveolar pneumonia. When the patient can tolerate the procedure, a PA and lateral chest x-ray would be useful. Electronically Signed   By: David  Martinique M.D.   On: 05/14/2016 13:06   Scheduled Meds: . ferrous sulfate  325 mg Oral Q breakfast  . losartan  100 mg Oral Daily   And  . hydrochlorothiazide  12.5 mg Oral Daily  . insulin aspart  0-20 Units Subcutaneous TID WC  . insulin aspart  0-5 Units Subcutaneous QHS  . potassium chloride  40 mEq Oral BID  . sodium chloride flush  3 mL Intravenous Q12H   Continuous Infusions: . heparin 2,200 Units/hr (05/15/16 0045)    Active Problems:   Benign essential HTN   Class 3  obesity due to excess calories with serious comorbidity and body mass index (BMI) greater than or equal to 70 in adult [facility])   Acute pulmonary embolus (HCC)   Acute respiratory failure with hypoxia [facility])   Critical Care Time spent: 49 mins  Irwin Brakeman, MD, FAAFP [facility] Pager [phone]  If 7PM-7AM, please contact night-coverage [email or web address] Password [email or web address] 05/15/2016, 7:23 AM    LOS: 1 day

## 2016-05-15 NOTE — Telephone Encounter (Signed)
Pt has been admitted to the hospital.  

## 2016-05-15 NOTE — Progress Notes (Signed)
  Echocardiogram 2D Echocardiogram bubble study with Definity has been performed.  Jennette Dubin 05/15/2016, 4:01 PM

## 2016-05-15 NOTE — Progress Notes (Addendum)
ANTICOAGULATION CONSULT NOTE - f/u Consult  Pharmacy Consult for heparin Indication: PE with aortic thrombi  Allergies  Allergen Reactions  . Penicillins Hives    Has patient had a PCN reaction causing immediate rash, facial/tongue/throat swelling, SOB or lightheadedness with hypotension: No Has patient had a PCN reaction causing severe rash involving mucus membranes or skin necrosis: No Has patient had a PCN reaction that required hospitalization No Has patient had a PCN reaction occurring within the last 10 years: No If all of the above answers are "NO", then may proceed with Cephalosporin use.    Patient Measurements: Listed height: 64 in Listed weight 193.4 kg Heparin Dosing Weight: 106 kg  Vital Signs: Temp: 98.9 F (37.2 C) (02/20 0331) Temp Source: Oral (02/20 0331) BP: 127/72 (02/20 0400) Pulse Rate: 97 (02/20 0400)  Labs:  Recent Labs  05/12/16 1229 05/12/16 1330 05/14/16 1056 05/14/16 2222 05/15/16 0638  HGB 13.0  --  12.9  --  12.2  HCT 41.2  --  43.4  --  42.0  PLT  --   --  309  --  298  LABPROT  --   --  15.1  --   --   INR  --   --  1.18  --   --   HEPARINUNFRC  --   --   --  <0.10* 0.20*  CREATININE  --  1.19* 1.15*  --   --   CKTOTAL  --  49  --   --   --   TROPONINI  --   --   --  0.04* 0.03*    Estimated Creatinine Clearance (by C-G formula based on SCr of 1.15 mg/dL (H)) Female: 116.2 mL/min Female: 140.1 mL/min   Medical History: Past Medical History:  Diagnosis Date  . Gestational diabetes   . Hypertension     Medications:  Scheduled:  . ferrous sulfate  325 mg Oral Q breakfast  . losartan  100 mg Oral Daily   And  . hydrochlorothiazide  12.5 mg Oral Daily  . insulin aspart  0-20 Units Subcutaneous TID WC  . insulin aspart  0-5 Units Subcutaneous QHS  . potassium chloride  40 mEq Oral BID  . sodium chloride flush  3 mL Intravenous Q12H    Assessment: Pharmacy is consulted to dose heparin in 41 yo female diagnosed with PE as  well as incidental aortic thrombi.  Not on any anticoagulants PTA. Pt was previously on Xarelto previously, but was not on med at current time.   Today, 05/15/16  Heparin level subtherapeutic (0.2) on 2200 units/hr  Hgb stable, Plts wnl  PT 15.1, INR 1.18  SCr 1.15, CrCl 75 ml/min/1.62m2 (normalized)  No bleeding noted other than patient reports that she is on her period per RN  Goal of Therapy:  Heparin level 0.3-0.7 units/ml Monitor platelets by anticoagulation protocol: Yes   Plan:   Increase heparin infusion to 2600 units/hr  Heparin level 6 hours after rate increase  Daily heparin level and CBC  Monitor for signs and symptoms of bleeding   Peggyann Juba, PharmD, BCPS Pager: [phone] 05/15/2016 7:36 AM   Addendum:  Now consulted to change IV heparin to PO Xarelto No anticoagulants prior to admission CrCl > 30 ml/min  Plan: Xarelto 15mg  PO bid with food x 21 days, then 20mg  PO daily Provided patient with Xarelto education  Peggyann Juba, PharmD, BCPS 05/15/2016 8:25 AM Pager: [phone]

## 2016-05-16 ENCOUNTER — Inpatient Hospital Stay (HOSPITAL_COMMUNITY): Payer: BLUE CROSS/BLUE SHIELD

## 2016-05-16 DIAGNOSIS — J9601 Acute respiratory failure with hypoxia: Secondary | ICD-10-CM

## 2016-05-16 DIAGNOSIS — E6609 Other obesity due to excess calories: Secondary | ICD-10-CM

## 2016-05-16 DIAGNOSIS — I2699 Other pulmonary embolism without acute cor pulmonale: Secondary | ICD-10-CM

## 2016-05-16 DIAGNOSIS — I10 Essential (primary) hypertension: Secondary | ICD-10-CM

## 2016-05-16 DIAGNOSIS — Z6841 Body Mass Index (BMI) 40.0 and over, adult: Secondary | ICD-10-CM

## 2016-05-16 LAB — COMPREHENSIVE METABOLIC PANEL
ALT: 16 U/L (ref 14–54)
AST: 15 U/L (ref 15–41)
Albumin: 3.1 g/dL — ABNORMAL LOW (ref 3.5–5.0)
Alkaline Phosphatase: 51 U/L (ref 38–126)
Anion gap: 6 (ref 5–15)
BUN: 11 mg/dL (ref 6–20)
CHLORIDE: 95 mmol/L — AB (ref 101–111)
CO2: 37 mmol/L — AB (ref 22–32)
Calcium: 8.8 mg/dL — ABNORMAL LOW (ref 8.9–10.3)
Creatinine, Ser: 0.94 mg/dL (ref 0.44–1.00)
Glucose, Bld: 136 mg/dL — ABNORMAL HIGH (ref 65–99)
POTASSIUM: 4.2 mmol/L (ref 3.5–5.1)
SODIUM: 138 mmol/L (ref 135–145)
Total Bilirubin: 0.5 mg/dL (ref 0.3–1.2)
Total Protein: 7.1 g/dL (ref 6.5–8.1)

## 2016-05-16 LAB — GLUCOSE, CAPILLARY
GLUCOSE-CAPILLARY: 125 mg/dL — AB (ref 65–99)
GLUCOSE-CAPILLARY: 112 mg/dL — AB (ref 65–99)
GLUCOSE-CAPILLARY: 117 mg/dL — AB (ref 65–99)
Glucose-Capillary: 120 mg/dL — ABNORMAL HIGH (ref 65–99)

## 2016-05-16 LAB — CBC
HCT: 43 % (ref 36.0–46.0)
Hemoglobin: 12.3 g/dL (ref 12.0–15.0)
MCH: 24.8 pg — ABNORMAL LOW (ref 26.0–34.0)
MCHC: 28.6 g/dL — ABNORMAL LOW (ref 30.0–36.0)
MCV: 86.9 fL (ref 78.0–100.0)
PLATELETS: 348 10*3/uL (ref 150–400)
RBC: 4.95 MIL/uL (ref 3.87–5.11)
RDW: 19 % — AB (ref 11.5–15.5)
WBC: 13.7 10*3/uL — AB (ref 4.0–10.5)

## 2016-05-16 LAB — LUPUS ANTICOAGULANT PANEL
DRVVT: 45.9 s (ref 0.0–47.0)
PTT Lupus Anticoagulant: 44.3 s (ref 0.0–51.9)

## 2016-05-16 LAB — URINE CULTURE

## 2016-05-16 LAB — HIV ANTIBODY (ROUTINE TESTING W REFLEX): HIV SCREEN 4TH GENERATION: NONREACTIVE

## 2016-05-16 MED ORDER — GUAIFENESIN-DM 100-10 MG/5ML PO SYRP
5.0000 mL | ORAL_SOLUTION | ORAL | Status: DC | PRN
  Administered 2016-05-16: 5 mL via ORAL
  Filled 2016-05-16: qty 10

## 2016-05-16 MED ORDER — RIVAROXABAN 20 MG PO TABS: 20.0000 mg | ORAL_TABLET | Freq: Every day | ORAL | Status: DC

## 2016-05-16 MED ORDER — RIVAROXABAN 15 MG PO TABS
15.0000 mg | ORAL_TABLET | Freq: Two times a day (BID) | ORAL | Status: DC
  Administered 2016-05-16: 15 mg via ORAL
  Filled 2016-05-16: qty 1

## 2016-05-16 MED ORDER — HEPARIN (PORCINE) IN NACL 100-0.45 UNIT/ML-% IJ SOLN
1800.0000 [IU]/h | INTRAMUSCULAR | Status: DC
  Administered 2016-05-17: 1800 [IU]/h via INTRAVENOUS
  Filled 2016-05-16 (×2): qty 250

## 2016-05-16 NOTE — Progress Notes (Signed)
ANTICOAGULATION CONSULT NOTE - f/u [place] for Xarelto >> Heparin IV Indication: PE with aortic thrombi  Allergies  Allergen Reactions  . Penicillins Hives    Has patient had a PCN reaction causing immediate rash, facial/tongue/throat swelling, SOB or lightheadedness with hypotension: No Has patient had a PCN reaction causing severe rash involving mucus membranes or skin necrosis: No Has patient had a PCN reaction that required hospitalization No Has patient had a PCN reaction occurring within the last 10 years: No If all of the above answers are "NO", then may proceed with Cephalosporin use.    Patient Measurements: Listed height: 64 in Listed weight 193.4 kg Heparin Dosing Weight: 106 kg  Vital Signs: Temp: 99 F (37.2 C) (02/21 1336) Temp Source: Oral (02/21 1336) BP: 105/38 (02/21 1336) Pulse Rate: 99 (02/21 1336)  Labs:  Recent Labs  05/14/16 1056 05/14/16 2222 05/15/16 0638 05/15/16 1246 05/16/16 0627  HGB 12.9  --  12.2  --  12.3  HCT 43.4  --  42.0  --  43.0  PLT 309  --  298  --  348  LABPROT 15.1  --   --   --   --   INR 1.18  --   --   --   --   HEPARINUNFRC  --  <0.10* 0.20*  --   --   CREATININE 1.15*  --  1.06*  --  0.94  TROPONINI  --  0.04* 0.03* 0.03*  --     Estimated Creatinine Clearance (by C-G formula based on SCr of 0.94 mg/dL) Female: 142.2 mL/min Female: 171.4 mL/min   Medical History: Past Medical History:  Diagnosis Date  . Gestational diabetes   . Hypertension     Medications:  Scheduled:  . ferrous sulfate  325 mg Oral Q breakfast  . furosemide  40 mg Oral Daily  . losartan  100 mg Oral Daily   And  . hydrochlorothiazide  12.5 mg Oral Daily  . insulin aspart  0-20 Units Subcutaneous TID WC  . insulin aspart  0-5 Units Subcutaneous QHS  . potassium chloride  40 mEq Oral BID  . sodium chloride flush  3 mL Intravenous Q12H    Assessment: Pharmacy is consulted to dose heparin in 41 yo female diagnosed with  PE as well as incidental aortic thrombi.  Not on any anticoagulants PTA. Pt was previously on Xarelto previously, but was not on med at current time.   2/20: Transitioned from IV heparin to Xarelto 2/21: Transition back to IV heparin. Patient has already received evening dose of Xarelto, therefore, will wait until tomorrow at 6AM to begin heparin (no bolus).  Goal of Therapy:  Heparin level 0.3-0.7 units/ml  PTT 66-102 seconds Monitor platelets by anticoagulation protocol: Yes   Plan:   Check baseline heparin level and PTT at 5AM tomorrow  At 6AM, begin IV heparin infusion 1800 units/hr  Check heparin level and PTT 6hr after starting heparin  Peggyann Juba, PharmD, BCPS Pager: [phone] 05/16/2016 5:38 PM

## 2016-05-16 NOTE — Progress Notes (Signed)
[facility]  PROGRESS NOTE  Alexa [name] [id number] DOB: 1975/04/25 DOA: 05/14/2016 PCP: Pcp Not In System   Brief HPI:   41 y.o.femalewith medical history of hypertension and diabetes mellitus presented with a 5 day history of shortness of breath that began on 05/10/2015.  The patient was initially hypoxemic with oxygen saturation of 78% on room air. She was stabilized on 3 L with oxygen saturation 98%. Chest x-ray showed increased interstitial markings without distinct infiltrate. CT angiogram of the chest was obtained and showed pulmonary emboli in the distal right pulmonary artery and segmental emboli in the right upper lobe and right lower lobe segmental arteries. In addition, thrombus was noted in the patient's descending thoracic aorta. The patient was started on heparin drip. The patient remained hemodynamically stable with heart rate in the upper 90s. BMP showed potassium 3.1. WBC was 15.0. EKG shows sinus tachycardia with nonspecific T-wave changes and right axis deviation. PCCM was consulted because there was evidence of possible right heart strain.     Subjective   Denies shortness of breath this morning.   Assessment/Plan:     Acute respiratory failure with hypoxia -Secondary to submassive PE -Presently stable on 3 L -Wean oxygen for saturation greater than 92% -Pulmonary hygiene   Acute Submassive pulmonary embolus -Started on Xarelto  bubble study pending.  -PCCM consult appreciated: see recommendations -Main pulmonary arteries enlarged representing a component of pulmonary hypertension -Pt is very high risk for VTE -Outpatient hematology consult and coagulation work up recommended at discharge. -Pt likely will need to be anticoagulated indefinitely.      Pulmonary Edema - n Repeat chest x-ray today showed minimal improvement of the vascular congestion   Lasix currently on hold as patient received intravenous dye yesterday. - We will restart  Lasix in a.m.  Aortic thrombi -Incidental finding of thrombus in the distal descending aorta -CT angiogram of abdomen / pelvis  showed Intraluminal aortic thrombus extends from the distal descending thoracic aorta into the supra celiac abdominal aorta, terminating just above the origin of the celiac axis. I called and discussed with vascular surgeon Dr. Trula Slade, who will see the patient and make recommendations.    Lower extremity edema -05/14/2016 Venous duplex lower extremities inconclusive secondary to patient's body habitus   Hypertension -Holding Lasix as the patient received intravenous dye yesterday -BPs have been stable, well controlled and managed  Suspect OSA - \ Pt at high risk for this and needs outpatient sleep study scheduled at discharge, she declined to try CPAP last night.   Leukocytosis -Likely stress demargination -UA and urine culture -Blood cultures 2 sets  Diabetes mellitus type 2 -05/12/2016 Hemoglobin A1c 6.2 -Holding metformin -NovoLog sliding scale    DVT prophylaxis: on Xarelto  Code Status:  Full code  Family Communication: No family present at bedside  Disposition Plan: Pending echocardiogram with bubble study   Consultants:  PC CM  Procedures:  None    Antibiotics:   Anti-infectives    None       Objective   Vitals:   05/16/16 0445 05/16/16 0800 05/16/16 1022 05/16/16 1336  BP: (!) 145/80   (!) 105/38  Pulse: (!) 112   99  Resp: 18   (!) 26  Temp: 99.6 F (37.6 C)   99 F (37.2 C)  TempSrc: Oral   Oral  SpO2: 99% 92% 95% 94%  Weight:      Height:        Intake/Output Summary (Last 24 hours) at  05/16/16 1538 Last data filed at 05/16/16 1000  Gross per 24 hour  Intake             1200 ml  Output                0 ml  Net             1200 ml   Filed Weights   05/14/16 1837  Weight: (!) 195.5 kg (431 lb)     Physical Examination:  General exam: Appears calm and comfortable. Respiratory system:  Clear to auscultation. Respiratory effort normal. Cardiovascular system:  RRR. No  murmurs, rubs, gallops. Bilateral edema of lower extremities  GI system: Abdomen is nondistended, soft and nontender. No organomegaly.  Central nervous system. No focal neurological deficits. 5 x 5 power in all extremities. Skin: No rashes, lesions or ulcers. Psychiatry: Alert, oriented x 3.Judgement and insight appear normal. Affect normal.    Data Reviewed: I have personally reviewed following labs and imaging studies  CBG:  Recent Labs Lab 05/15/16 1228 05/15/16 1706 05/15/16 2001 05/16/16 0745 05/16/16 1159  GLUCAP 96 113* 109* 120* 125*    CBC:  Recent Labs Lab 05/12/16 1229 05/14/16 1056 05/15/16 0638 05/16/16 0627  WBC 11.6* 15.0* 13.6* 13.7*  NEUTROABS  --  11.8*  --   --   HGB 13.0 12.9 12.2 12.3  HCT 41.2 43.4 42.0 43.0  MCV 82.4 84.6 86.4 86.9  PLT  --  309 298 0000000    Basic Metabolic Panel:  Recent Labs Lab 05/12/16 1330 05/14/16 1056 05/15/16 0638 05/16/16 0627  NA 140 136 139 138  K 3.6 3.1* 3.6 4.2  CL 93* 93* 94* 95*  CO2 32* 34* 33* 37*  GLUCOSE 118* 103* 122* 136*  BUN 19 17 16 11   CREATININE 1.19* 1.15* 1.06* 0.94  CALCIUM 8.5* 8.3* 8.3* 8.8*  MG  --   --  2.0  --     Recent Results (from the past 240 hour(s))  MRSA PCR Screening     Status: None   Collection Time: 05/14/16  6:30 PM  Result Value Ref Range Status   MRSA by PCR NEGATIVE NEGATIVE Final    Comment:        The GeneXpert MRSA Assay (FDA approved for NASAL specimens only), is one component of a comprehensive MRSA colonization surveillance program. It is not intended to diagnose MRSA infection nor to guide or monitor treatment for MRSA infections.   Urine culture     Status: Abnormal   Collection Time: 05/14/16  9:48 PM  Result Value Ref Range Status   Specimen Description URINE, CLEAN CATCH  Final   Special Requests NONE  Final   Culture MULTIPLE SPECIES PRESENT, SUGGEST  RECOLLECTION (A)  Final   Report Status 05/16/2016 FINAL  Final     Liver Function Tests:  Recent Labs Lab 05/12/16 1330 05/16/16 0627  AST 32 15  ALT 30 16  ALKPHOS 79 51  BILITOT 0.8 0.5  PROT 6.6 7.1  ALBUMIN 3.4* 3.1*    Cardiac Enzymes:  Recent Labs Lab 05/12/16 1330 05/14/16 2222 05/15/16 0638 05/15/16 1246  CKTOTAL 49  --   --   --   TROPONINI  --  0.04* 0.03* 0.03*   BNP (last 3 results)  Recent Labs  12/30/15 1144  BNP 22.3       Studies: Dg Chest 2 View  Result Date: 05/16/2016 CLINICAL DATA:  Pulmonary edema.  Shortness of breath. EXAM: CHEST  2 VIEW COMPARISON:  Chest radiograph and CTA 05/14/2016 FINDINGS: The cardiomediastinal silhouette is unchanged. Pulmonary vascular congestion is slightly improved. The interstitial markings remain mildly increased diffusely. There is no evidence of new confluent airspace opacity, sizeable pleural effusion, or pneumothorax. IMPRESSION: Minimally improved pulmonary vascular congestion. Electronically Signed   By: Logan Bores M.D.   On: 05/16/2016 09:03   Ct Angio Abd/pel W/ And/or W/o  Result Date: 05/15/2016 CLINICAL DATA:  5 day history of shortness of breath that began on 05/10/2015. The patient was initially hypoxemic with oxygen saturation of 78% on room air. She was stabilized on 3 L with oxygen saturation 98%. Chest x-ray showed increased interstitial markings without distinct infiltrate. CT angiogram of the chest was obtained and showed pulmonary emboli in the distal right pulmonary artery and segmental emboli in the right upper lobe and right lower lobe segmental arteries. In addition, thrombus was noted in the patient's descending thoracic aorta. Thrombus in the distal descending thoracic aorta and proximalabdominal aorta, incompletely visualized. The patient was started on heparin drip. The patient remained hemodynamically stable with heart rate in the upper 90s. BMP showed potassium 3.1. WBC was 15.0. EKG  shows sinus tachycardia with nonspecific T-wave changes and right axis deviation. PCCM was consulted because there was evidence of possible right heart strain EXAM: CTA ABDOMEN AND PELVIS WITH CONTRAST TECHNIQUE: Multidetector CT imaging of the abdomen and pelvis was performed using the standard protocol during bolus administration of intravenous contrast. Multiplanar reconstructed images and MIPs were obtained and reviewed to evaluate the vascular anatomy. CONTRAST:  100 mL Isovue 370 IV COMPARISON:  CT chest from earlier the same day FINDINGS: VASCULAR Aorta: Intraluminal thrombus in the visualized distal descending thoracic aorta protruding centrally down the lumen of the proximal abdominal aorta, terminating just above the origin of the celiac axis. The more distal abdominal aorta is unremarkable. No dissection, aneurysm, or stenosis. Celiac: Patent without evidence of aneurysm, dissection, vasculitis or significant stenosis. Distal branching unremarkable. SMA: Patent without evidence of aneurysm, dissection, vasculitis or significant stenosis. Renals: Both renal arteries are patent without evidence of aneurysm, dissection, vasculitis, fibromuscular dysplasia or significant stenosis. IMA: Patent without evidence of aneurysm, dissection, vasculitis or significant stenosis. Inflow: Patent without evidence of aneurysm, dissection, vasculitis or significant stenosis. Proximal Outflow: Incomplete opacification, without definite pathology Veins: Dedicated venous phase imaging not obtained. Review of the MIP images confirms the above findings. NON-VASCULAR Lower chest: Bilateral pulmonary emboli as described on prior study. Patchy peripheral airspace opacities in both lung bases stable. No pleural or pericardial effusion. Hepatobiliary: No focal liver abnormality is seen. No gallstones, gallbladder wall thickening, or biliary dilatation. Pancreas: Unremarkable. No pancreatic ductal dilatation or surrounding  inflammatory changes. Spleen: 2 wedge-shaped peripheral regions of decreased enhancement may represent sequela of early scan timing versus subcapsular infarcts. Adrenals/Urinary Tract: Normal adrenals. Bilateral striated nephrograms on this early phase imaging. No urolithiasis or hydronephrosis. Urinary bladder incompletely distended. Stomach/Bowel: Stomach is nondistended. Small bowel nondilated. Appendix not discretely identified. The colon is nondilated, unremarkable. Lymphatic: Prominent inguinal adenopathy left greater than right, up to 13 mm short axis diameter. Bilateral iliac chain adenopathy up to 15 mm . Prominent subcentimeter left para-aortic and aortocaval retroperitoneal lymph nodes. No mesenteric adenopathy. Reproductive: Uterus and bilateral adnexa are unremarkable. Other: No ascites.  No free air. Musculoskeletal: Degenerative disc disease L2-S1. Negative for fracture or worrisome bone lesion. IMPRESSION: VASCULAR 1. Intraluminal aortic thrombus extends from the distal descending thoracic aorta into the supra celiac abdominal aorta, terminating  just above the origin of the celiac axis. 2. No definite distal emboli are identified. NON-VASCULAR *Possible splenic infarcts versus inhomogeneous early phase enhancement. *Bilateral inguinal and iliac adenopathy, possibly reactive but nonspecific. Electronically Signed   By: Lucrezia Europe M.D.   On: 05/15/2016 13:48    Scheduled Meds: . ferrous sulfate  325 mg Oral Q breakfast  . furosemide  40 mg Oral Daily  . losartan  100 mg Oral Daily   And  . hydrochlorothiazide  12.5 mg Oral Daily  . insulin aspart  0-20 Units Subcutaneous TID WC  . insulin aspart  0-5 Units Subcutaneous QHS  . potassium chloride  40 mEq Oral BID  . rivaroxaban  15 mg Oral BID WC   Followed by  . [START ON 06/05/2016] rivaroxaban  20 mg Oral Q supper  . sodium chloride flush  3 mL Intravenous Q12H      Time spent: 25 min  [facility] Pager  [phone]. If 7PM-7AM, please contact night-coverage at [email or web address], Office  [phone]  password [email or web address] 05/16/2016, 3:38 PM  LOS: 2 days

## 2016-05-16 NOTE — Progress Notes (Signed)
patient unavailable. Briefly this is a 41 year old female who presented with a 5 day history of SOB.  CTA revealed a distal right PE.  Incidental finding was a thrombus in the distal thoracic aorta.  She went on to have a CTA of the abd/pelvis which shows the distal extent of the clot above the celiac artery.  There is a possibility of a splenic infarct.  No further evidence of embolic disease.  - I am ordering ABI's to make sure that she has not embolized distally - Assuming no further embolic evidence, I would recommend anticoagulation and repeat CTA of the chest, abdomen and pelvis in 3 months which I WLLL ARRANGE - CHECK WITH PHARMACY REGARDING THE MOST APPROPRIATE CHOICE FOR ANTICOAGULATION GIVEN HER WEIGHT - case discussed with Dr. Darrick Meigs - please call with any further questions  [facility]

## 2016-05-16 NOTE — Procedures (Signed)
Patient refused CPAP for tonight.  Patient is aware if she changes her mind to ask RN to call Respiratory.  RN aware.

## 2016-05-17 ENCOUNTER — Other Ambulatory Visit: Payer: Self-pay | Admitting: *Deleted

## 2016-05-17 ENCOUNTER — Inpatient Hospital Stay (HOSPITAL_COMMUNITY): Payer: BLUE CROSS/BLUE SHIELD

## 2016-05-17 DIAGNOSIS — R0602 Shortness of breath: Secondary | ICD-10-CM

## 2016-05-17 DIAGNOSIS — I2699 Other pulmonary embolism without acute cor pulmonale: Secondary | ICD-10-CM

## 2016-05-17 DIAGNOSIS — I7410 Embolism and thrombosis of unspecified parts of aorta: Secondary | ICD-10-CM

## 2016-05-17 LAB — COMPREHENSIVE METABOLIC PANEL
ALBUMIN: 2.8 g/dL — AB (ref 3.5–5.0)
ALK PHOS: 44 U/L (ref 38–126)
ALT: 14 U/L (ref 14–54)
AST: 24 U/L (ref 15–41)
Anion gap: 6 (ref 5–15)
BUN: 10 mg/dL (ref 6–20)
CALCIUM: 8.7 mg/dL — AB (ref 8.9–10.3)
CHLORIDE: 91 mmol/L — AB (ref 101–111)
CO2: 38 mmol/L — AB (ref 22–32)
CREATININE: 0.84 mg/dL (ref 0.44–1.00)
GFR calc Af Amer: >60 mL/min (ref >60)
GFR calc non Af Amer: >60 mL/min (ref >60)
GLUCOSE: 147 mg/dL — AB (ref 65–99)
Potassium: 5.4 mmol/L — ABNORMAL HIGH (ref 3.5–5.1)
SODIUM: 135 mmol/L (ref 135–145)
Total Bilirubin: 1.6 mg/dL — ABNORMAL HIGH (ref 0.3–1.2)
Total Protein: 6.8 g/dL (ref 6.5–8.1)

## 2016-05-17 LAB — HEPARIN LEVEL (UNFRACTIONATED)
HEPARIN UNFRACTIONATED: 0.46 [IU]/mL (ref 0.30–0.70)
Heparin Unfractionated: >2.2 IU/mL — ABNORMAL HIGH (ref 0.30–0.70)
Heparin Unfractionated: 1.08 IU/mL — ABNORMAL HIGH (ref 0.30–0.70)

## 2016-05-17 LAB — GLUCOSE, CAPILLARY
GLUCOSE-CAPILLARY: 163 mg/dL — AB (ref 65–99)
GLUCOSE-CAPILLARY: 108 mg/dL — AB (ref 65–99)
GLUCOSE-CAPILLARY: 118 mg/dL — AB (ref 65–99)
Glucose-Capillary: 119 mg/dL — ABNORMAL HIGH (ref 65–99)

## 2016-05-17 LAB — CBC
HCT: 43 % (ref 36.0–46.0)
HEMOGLOBIN: 11.9 g/dL — AB (ref 12.0–15.0)
MCH: 24.3 pg — ABNORMAL LOW (ref 26.0–34.0)
MCHC: 27.7 g/dL — ABNORMAL LOW (ref 30.0–36.0)
MCV: 87.9 fL (ref 78.0–100.0)
PLATELETS: 363 10*3/uL (ref 150–400)
RBC: 4.89 MIL/uL (ref 3.87–5.11)
RDW: 18.8 % — ABNORMAL HIGH (ref 11.5–15.5)
WBC: 11.4 10*3/uL — AB (ref 4.0–10.5)

## 2016-05-17 LAB — APTT
APTT: 27 s (ref 24–36)
aPTT: 43 seconds — ABNORMAL HIGH (ref 24–36)
aPTT: 57 seconds — ABNORMAL HIGH (ref 24–36)

## 2016-05-17 MED ORDER — WARFARIN VIDEO: Freq: Once | Status: DC

## 2016-05-17 MED ORDER — HEPARIN BOLUS VIA INFUSION
5000.0000 [IU] | Freq: Once | INTRAVENOUS | Status: AC
  Administered 2016-05-17: 5000 [IU] via INTRAVENOUS
  Filled 2016-05-17: qty 5000

## 2016-05-17 MED ORDER — WARFARIN - PHARMACIST DOSING INPATIENT
Freq: Every day | Status: DC
  Administered 2016-05-18: 18:00:00

## 2016-05-17 MED ORDER — SODIUM POLYSTYRENE SULFONATE 15 GM/60ML PO SUSP
15.0000 g | Freq: Once | ORAL | Status: AC
  Administered 2016-05-17: 15 g via ORAL
  Filled 2016-05-17: qty 60

## 2016-05-17 MED ORDER — HEPARIN (PORCINE) IN NACL 100-0.45 UNIT/ML-% IJ SOLN
2600.0000 [IU]/h | INTRAMUSCULAR | Status: DC
  Administered 2016-05-18: 2600 [IU]/h via INTRAVENOUS

## 2016-05-17 MED ORDER — COUMADIN BOOK
Freq: Once | Status: AC
  Administered 2016-05-17: 18:00:00
  Filled 2016-05-17: qty 1

## 2016-05-17 MED ORDER — WARFARIN SODIUM 5 MG PO TABS
10.0000 mg | ORAL_TABLET | Freq: Once | ORAL | Status: AC
  Administered 2016-05-17: 10 mg via ORAL
  Filled 2016-05-17: qty 2

## 2016-05-17 MED ORDER — HEPARIN (PORCINE) IN NACL 100-0.45 UNIT/ML-% IJ SOLN
2100.0000 [IU]/h | INTRAMUSCULAR | Status: DC
  Administered 2016-05-17 (×2): 2100 [IU]/h via INTRAVENOUS
  Filled 2016-05-17 (×2): qty 250

## 2016-05-17 MED ORDER — IPRATROPIUM-ALBUTEROL 0.5-2.5 (3) MG/3ML IN SOLN
3.0000 mL | Freq: Four times a day (QID) | RESPIRATORY_TRACT | Status: DC
  Administered 2016-05-17 (×2): 3 mL via RESPIRATORY_TRACT
  Filled 2016-05-17 (×3): qty 3

## 2016-05-17 MED ORDER — ALBUTEROL SULFATE (2.5 MG/3ML) 0.083% IN NEBU: 2.5000 mg | INHALATION_SOLUTION | RESPIRATORY_TRACT | Status: DC | PRN

## 2016-05-17 MED ORDER — IPRATROPIUM-ALBUTEROL 0.5-2.5 (3) MG/3ML IN SOLN
3.0000 mL | Freq: Three times a day (TID) | RESPIRATORY_TRACT | Status: DC
  Administered 2016-05-18: 3 mL via RESPIRATORY_TRACT
  Filled 2016-05-17: qty 3

## 2016-05-17 NOTE — Progress Notes (Addendum)
VASCULAR LAB PRELIMINARY  ARTERIAL  ABI completed:    RIGHT    LEFT    PRESSURE WAVEFORM  PRESSURE WAVEFORM  BRACHIAL 142 Triphasic BRACHIAL 144 Triphasic  DP   DP    AT   AT    PT   PT    PER   PER    GREAT TOE  NA GREAT TOE  NA    RIGHT LEFT  ABI     Exam is totally inconclusive. Unable to obtain ABIs due to lack of significant audible Doppler signals bilaterally. An unsuccessful attempt was made to obtain great toe pressures.There was interference of the air bed, oxygen flow noise, and body habitus. Patient is also tachycardic.  Roylene Heaton, RVS 05/17/2016, 10:18 AM

## 2016-05-17 NOTE — Progress Notes (Addendum)
[facility]  PROGRESS NOTE  Alexa [name] [id number] DOB: [birth year] DOA: 05/14/2016 PCP: Pcp Not In System   Brief HPI:   41 y.o.femalewith medical history of hypertension and diabetes mellitus presented with a 5 day history of shortness of breath that began on 05/10/2015.  The patient was initially hypoxemic with oxygen saturation of 78% on room air. She was stabilized on 3 L with oxygen saturation 98%. Chest x-ray showed increased interstitial markings without distinct infiltrate. CT angiogram of the chest was obtained and showed pulmonary emboli in the distal right pulmonary artery and segmental emboli in the right upper lobe and right lower lobe segmental arteries. In addition, thrombus was noted in the patient's descending thoracic aorta. The patient was started on heparin drip. The patient remained hemodynamically stable with heart rate in the upper 90s. BMP showed potassium 3.1. WBC was 15.0. EKG shows sinus tachycardia with nonspecific T-wave changes and right axis deviation. PCCM was consulted because there was evidence of possible right heart strain.     Subjective   Denies shortness of breath this morning.   Assessment/Plan:     Acute respiratory failure with hypoxia -Secondary to submassive PE -Presently stable on 3 L -Wean oxygen for saturation greater than 92% -Pulmonary hygiene   Acute Submassive pulmonary embolus - will discontinue Xarelto, and start heparin protocol. Discussed with hematology/oncology, patient will be started on coumadin per pharmacy,  bubble study showed no PFO. -PCCM consult appreciated: see recommendations -Main pulmonary arteries enlarged representing a component of pulmonary hypertension -Pt is very high risk for VTE -Outpatient hematology consult and coagulation work up recommended at discharge. -Pt likely will need to be anticoagulated indefinitely.      Pulmonary Edema -  Repeat chest x-ray today showed minimal  improvement of the vascular congestion  Continue Lasix 40 mg po daily. -   Aortic thrombi -Incidental finding of thrombus in the distal descending aorta -CT angiogram of abdomen / pelvis  showed Intraluminal aortic thrombus extends from the distal descending thoracic aorta into the supra celiac abdominal aorta, terminating just above the origin of the celiac axis. I called and discussed with vascular surgeon Dr. Trula Slade, who saw the patient and recommended to check efficacy of Xarelto. I called and discussed with hematology Dr. Sonny Dandy, recommended start patient on Coumadin as efficacy of Xarelto is questionable in this condition. Will restart heparin and Coumadin per pharmacy   Lower extremity edema -05/14/2016 Venous duplex lower extremities inconclusive secondary to patient's body habitus  Hyperkalemia Potassium is 5.4, will give 1 dose of Kayexalate 15 g by mouth 1. Follow BMP in a.m.   Hypertension -BPs have been stable, well controlled and managed  Suspect OSA -  Pt at high risk for this and needs outpatient sleep study scheduled at discharge, she declined to try CPAP.   Leukocytosis -Likely stress demargination -UA and urine culture -Blood cultures 2 sets  Diabetes mellitus type 2 -05/12/2016 Hemoglobin A1c 6.2 -Holding metformin -NovoLog sliding scale    DVT prophylaxis: Heparin  Code Status:  Full code  Family Communication: No family present at bedside  Disposition Plan: Pending echocardiogram with bubble study   Consultants:  PC CM  Procedures:  None    . heparin 1,800 Units/hr (05/17/16 0606)  Antibiotics:   Anti-infectives    None       Objective   Vitals:   05/16/16 1336 05/16/16 1830 05/16/16 2000 05/17/16 0515  BP: (!) 105/38  (!) 114/54 124/82  Pulse: 99  100 100  Resp: (!) 26     Temp: 99 F (37.2 C)  99.9 F (37.7 C) 98.9 F (37.2 C)  TempSrc: Oral  Oral Oral  SpO2: 94% 95% 92% 97%  Weight:      Height:         Intake/Output Summary (Last 24 hours) at 05/17/16 1251 Last data filed at 05/17/16 0606  Gross per 24 hour  Intake              243 ml  Output                0 ml  Net              243 ml   Filed Weights   05/14/16 1837  Weight: (!) 195.5 kg (431 lb)     Physical Examination:  General exam: Appears calm and comfortable. Respiratory system: Bilateral wheezing. Respiratory effort normal. Cardiovascular system:  RRR. No  murmurs, rubs, gallops. Bilateral edema of lower extremities  GI system: Abdomen is nondistended, soft and nontender. No organomegaly.  Central nervous system. No focal neurological deficits. 5 x 5 power in all extremities. Skin: No rashes, lesions or ulcers. Psychiatry: Alert, oriented x 3.Judgement and insight appear normal. Affect normal.    Data Reviewed: I have personally reviewed following labs and imaging studies  CBG:  Recent Labs Lab 05/16/16 0745 05/16/16 1159 05/16/16 1721 05/16/16 2147 05/17/16 0749  GLUCAP 120* 125* 112* 117* 163*    CBC:  Recent Labs Lab 05/12/16 1229 05/14/16 1056 05/15/16 0638 05/16/16 0627 05/17/16 0553  WBC 11.6* 15.0* 13.6* 13.7* 11.4*  NEUTROABS  --  11.8*  --   --   --   HGB 13.0 12.9 12.2 12.3 11.9*  HCT 41.2 43.4 42.0 43.0 43.0  MCV 82.4 84.6 86.4 86.9 87.9  PLT  --  309 298 348 [id number]    Basic Metabolic Panel:  Recent Labs Lab 05/12/16 1330 05/14/16 1056 05/15/16 0638 05/16/16 0627 05/17/16 0553  NA 140 136 139 138 135  K 3.6 3.1* 3.6 4.2 5.4*  CL 93* 93* 94* 95* 91*  CO2 32* 34* 33* 37* 38*  GLUCOSE 118* 103* 122* 136* 147*  BUN 19 17 16 11 10   CREATININE 1.19* 1.15* 1.06* 0.94 0.84  CALCIUM 8.5* 8.3* 8.3* 8.8* 8.7*  MG  --   --  2.0  --   --     Recent Results (from the past 240 hour(s))  MRSA PCR Screening     Status: None   Collection Time: 05/14/16  6:30 PM  Result Value Ref Range Status   MRSA by PCR NEGATIVE NEGATIVE Final    Comment:        The GeneXpert MRSA Assay  (FDA approved for NASAL specimens only), is one component of a comprehensive MRSA colonization surveillance program. It is not intended to diagnose MRSA infection nor to guide or monitor treatment for MRSA infections.   Urine culture     Status: Abnormal   Collection Time: 05/14/16  9:48 PM  Result Value Ref Range Status   Specimen Description URINE, CLEAN CATCH  Final   Special Requests NONE  Final   Culture MULTIPLE SPECIES PRESENT, SUGGEST RECOLLECTION (A)  Final   Report Status 05/16/2016 FINAL  Final     Liver Function Tests:  Recent Labs Lab 05/12/16 1330 05/16/16 0627 05/17/16 0553  AST 32 15 24  ALT 30 16 14   ALKPHOS 79 51 44  BILITOT 0.8 0.5 1.6*  PROT  6.6 7.1 6.8  ALBUMIN 3.4* 3.1* 2.8*    Cardiac Enzymes:  Recent Labs Lab 05/12/16 1330 05/14/16 2222 05/15/16 0638 05/15/16 1246  CKTOTAL 49  --   --   --   TROPONINI  --  0.04* 0.03* 0.03*   BNP (last 3 results)  Recent Labs  12/30/15 1144  BNP 22.3       Studies: Dg Chest 2 View  Result Date: 05/16/2016 CLINICAL DATA:  Pulmonary edema.  Shortness of breath. EXAM: CHEST  2 VIEW COMPARISON:  Chest radiograph and CTA 05/14/2016 FINDINGS: The cardiomediastinal silhouette is unchanged. Pulmonary vascular congestion is slightly improved. The interstitial markings remain mildly increased diffusely. There is no evidence of new confluent airspace opacity, sizeable pleural effusion, or pneumothorax. IMPRESSION: Minimally improved pulmonary vascular congestion. Electronically Signed   By: Logan Bores M.D.   On: 05/16/2016 09:03    Scheduled Meds: . ferrous sulfate  325 mg Oral Q breakfast  . furosemide  40 mg Oral Daily  . losartan  100 mg Oral Daily   And  . hydrochlorothiazide  12.5 mg Oral Daily  . insulin aspart  0-20 Units Subcutaneous TID WC  . insulin aspart  0-5 Units Subcutaneous QHS  . sodium chloride flush  3 mL Intravenous Q12H      Time spent: 25 min  [facility] Pager [phone]. If 7PM-7AM, please contact night-coverage at [email or web address], Office  [phone]  password [email or web address] 05/17/2016, 12:51 PM  LOS: 3 days

## 2016-05-17 NOTE — Progress Notes (Signed)
Pt refused CPAP qhs.  Pt states she unable to tolerate it last night and wishes to sleep with her nasal cannula on tonight.  Education provided.  Pt encouraged to contact RT should she change her mind.

## 2016-05-17 NOTE — Progress Notes (Signed)
Pharmacy - IV heparin  Assessment:    Please see note from Arlyn Dunning, PharmD earlier today for full details.  Briefly, 41 y.o. female on IV heparin for PE with aortic thrombus. Checking aPTT and heparin levels with recent Eliquis   Most recent aPTT now low despite previous rate increase  Heparin level in therapeutic range, but elevated by recent Xarelto and would not consider reliable unless correlating well with aPTT  No reported bleeding; previously had noticed pink-tinged urine that is no longer present.  Plan:   Heparin IV bolus 5000 units from bag  Increase IV heparin rate to 2600 units/hr  Recheck heparin level/aPTT 6 hrs after rate change  Reuel Boom, PharmD, BCPS Pager: [phone] 05/17/2016, 8:59 PM

## 2016-05-17 NOTE — Progress Notes (Signed)
ANTICOAGULATION CONSULT NOTE - f/u Consult  Pharmacy Consult for Heparin IV and warfarin Indication: PE with aortic thrombi  Allergies  Allergen Reactions  . Penicillins Hives    Has patient had a PCN reaction causing immediate rash, facial/tongue/throat swelling, SOB or lightheadedness with hypotension: No Has patient had a PCN reaction causing severe rash involving mucus membranes or skin necrosis: No Has patient had a PCN reaction that required hospitalization No Has patient had a PCN reaction occurring within the last 10 years: No If all of the above answers are "NO", then may proceed with Cephalosporin use.    Patient Measurements: Listed height: 64 in Listed weight 193.4 kg Heparin Dosing Weight: 106 kg  Vital Signs: Temp: 98.9 F (37.2 C) (02/22 0515) Temp Source: Oral (02/22 0515) BP: 124/82 (02/22 0515) Pulse Rate: 100 (02/22 0515)  Labs:  Recent Labs  05/14/16 2222  05/15/16 [id number] 05/15/16 1246 05/16/16 0627 05/17/16 0553 05/17/16 1142  HGB  --   < > 12.2  --  12.3 11.9*  --   HCT  --   --  42.0  --  43.0 43.0  --   PLT  --   --  298  --  348 363  --   APTT  --   --   --   --   --  43* 57*  HEPARINUNFRC <0.10*  --  0.20*  --   --  >2.20* 1.08*  CREATININE  --   --  1.06*  --  0.94 0.84  --   TROPONINI 0.04*  --  0.03* 0.03*  --   --   --   < > = values in this interval not displayed.  Estimated Creatinine Clearance (by C-G formula based on SCr of 0.84 mg/dL) Female: 159.1 mL/min Female: 191.8 mL/min   Medical History: Past Medical History:  Diagnosis Date  . Gestational diabetes   . Hypertension     Medications:  Scheduled:  . ferrous sulfate  325 mg Oral Q breakfast  . furosemide  40 mg Oral Daily  . insulin aspart  0-20 Units Subcutaneous TID WC  . insulin aspart  0-5 Units Subcutaneous QHS  . ipratropium-albuterol  3 mL Nebulization Q6H  . losartan  100 mg Oral Daily  . sodium chloride flush  3 mL Intravenous Q12H  . sodium polystyrene  15  g Oral Once    Assessment: Pharmacy is consulted to dose heparin in 41 yo female diagnosed with PE as well as incidental aortic thrombi.  Not on any anticoagulants PTA. Pt was previously on Xarelto previously, but was not on med at current time.   2/20: Transitioned from IV heparin to Xarelto 2/21: Transition back to IV heparin. Patient has already received evening dose of Xarelto, therefore, will wait until tomorrow at 6AM to begin heparin (no bolus).  Today, 05/17/16  APTT subtherapeutic on current IV heparin rate of 1800 units/hr  Heparin level supratherapeutic as expected after being on Xarelto x 2 days before transition back to IV heparin yesterday and now to start warfarin per pharmacy dosing and bridge with IV heparin until INR is therapeutic  CBC stable  Per RN, no reported bleeding and no problems with IV site/line  Baseline INR 1.18 on 2/19.   Goal of Therapy:  Heparin level 0.3-0.7 units/ml  PTT 66-102 seconds Monitor platelets by anticoagulation protocol: Yes   Plan:   Increase IV heparin rate from 1800 units/hr to 2100 units/hr  Warfarin 10mg  x 1 tonight at  1800  Check heparin level and PTT 6hr after increasing heparin rate. Will check both aPTT and heparin levels until levels correlate then will switch to only heparin levels  Today will be Day 1 of overlap of min 5 day overlap of IV heparin and warfarin   Adrian Saran, PharmD, BCPS Pager [phone] 05/17/2016 1:08 PM

## 2016-05-18 ENCOUNTER — Inpatient Hospital Stay (HOSPITAL_COMMUNITY): Payer: BLUE CROSS/BLUE SHIELD

## 2016-05-18 LAB — COMPREHENSIVE METABOLIC PANEL
ALBUMIN: 2.9 g/dL — AB (ref 3.5–5.0)
ALT: 14 U/L (ref 14–54)
AST: 15 U/L (ref 15–41)
Alkaline Phosphatase: 41 U/L (ref 38–126)
Anion gap: 9 (ref 5–15)
BILIRUBIN TOTAL: 0.5 mg/dL (ref 0.3–1.2)
BUN: 9 mg/dL (ref 6–20)
CALCIUM: 8.5 mg/dL — AB (ref 8.9–10.3)
CO2: 39 mmol/L — ABNORMAL HIGH (ref 22–32)
Chloride: 90 mmol/L — ABNORMAL LOW (ref 101–111)
Creatinine, Ser: 0.95 mg/dL (ref 0.44–1.00)
GFR calc Af Amer: >60 mL/min (ref >60)
GFR calc non Af Amer: >60 mL/min (ref >60)
GLUCOSE: 96 mg/dL (ref 65–99)
Potassium: 4.3 mmol/L (ref 3.5–5.1)
Sodium: 138 mmol/L (ref 135–145)
TOTAL PROTEIN: 6.9 g/dL (ref 6.5–8.1)

## 2016-05-18 LAB — URINALYSIS, ROUTINE W REFLEX MICROSCOPIC
Bilirubin Urine: NEGATIVE
GLUCOSE, UA: NEGATIVE mg/dL
KETONES UR: NEGATIVE mg/dL
LEUKOCYTES UA: NEGATIVE
Nitrite: NEGATIVE
PH: 7 (ref 5.0–8.0)
Protein, ur: NEGATIVE mg/dL
SPECIFIC GRAVITY, URINE: 1.005 (ref 1.005–1.030)

## 2016-05-18 LAB — GLUCOSE, CAPILLARY
GLUCOSE-CAPILLARY: 108 mg/dL — AB (ref 65–99)
GLUCOSE-CAPILLARY: 119 mg/dL — AB (ref 65–99)
GLUCOSE-CAPILLARY: 124 mg/dL — AB (ref 65–99)
GLUCOSE-CAPILLARY: 111 mg/dL — AB (ref 65–99)

## 2016-05-18 LAB — APTT
APTT: 72 s — AB (ref 24–36)
APTT: 41 s — AB (ref 24–36)

## 2016-05-18 LAB — CBC
HEMATOCRIT: 42.8 % (ref 36.0–46.0)
HEMOGLOBIN: 12.1 g/dL (ref 12.0–15.0)
MCH: 25 pg — ABNORMAL LOW (ref 26.0–34.0)
MCHC: 28.3 g/dL — ABNORMAL LOW (ref 30.0–36.0)
MCV: 88.4 fL (ref 78.0–100.0)
Platelets: 374 10*3/uL (ref 150–400)
RBC: 4.84 MIL/uL (ref 3.87–5.11)
RDW: 19.1 % — ABNORMAL HIGH (ref 11.5–15.5)
WBC: 11.6 10*3/uL — AB (ref 4.0–10.5)

## 2016-05-18 LAB — HEPARIN LEVEL (UNFRACTIONATED)
Heparin Unfractionated: 0.38 IU/mL (ref 0.30–0.70)
Heparin Unfractionated: <0.1 IU/mL — ABNORMAL LOW (ref 0.30–0.70)

## 2016-05-18 LAB — PROTHROMBIN GENE MUTATION

## 2016-05-18 LAB — LACTIC ACID, PLASMA
LACTIC ACID, VENOUS: 1.1 mmol/L (ref 0.5–1.9)
Lactic Acid, Venous: 1 mmol/L (ref 0.5–1.9)

## 2016-05-18 LAB — FACTOR 5 LEIDEN

## 2016-05-18 LAB — PROTIME-INR
INR: 1.22
Prothrombin Time: 15.4 seconds — ABNORMAL HIGH (ref 11.4–15.2)

## 2016-05-18 MED ORDER — FUROSEMIDE 40 MG PO TABS
40.0000 mg | ORAL_TABLET | Freq: Two times a day (BID) | ORAL | Status: DC
Start: 2016-05-19 — End: 2016-05-22
  Administered 2016-05-19 – 2016-05-22 (×8): 40 mg via ORAL
  Filled 2016-05-18 (×8): qty 1

## 2016-05-18 MED ORDER — HEPARIN BOLUS VIA INFUSION
5000.0000 [IU] | Freq: Once | INTRAVENOUS | Status: AC
  Administered 2016-05-18: 5000 [IU] via INTRAVENOUS
  Filled 2016-05-18: qty 5000

## 2016-05-18 MED ORDER — IPRATROPIUM-ALBUTEROL 0.5-2.5 (3) MG/3ML IN SOLN
3.0000 mL | Freq: Two times a day (BID) | RESPIRATORY_TRACT | Status: DC
  Administered 2016-05-19 – 2016-05-22 (×7): 3 mL via RESPIRATORY_TRACT
  Filled 2016-05-18 (×8): qty 3

## 2016-05-18 MED ORDER — WARFARIN SODIUM 5 MG PO TABS
10.0000 mg | ORAL_TABLET | Freq: Once | ORAL | Status: AC
  Administered 2016-05-18: 10 mg via ORAL
  Filled 2016-05-18: qty 2

## 2016-05-18 MED ORDER — HEPARIN (PORCINE) IN NACL 100-0.45 UNIT/ML-% IJ SOLN
3200.0000 [IU]/h | INTRAMUSCULAR | Status: DC
  Administered 2016-05-18: 3200 [IU]/h via INTRAVENOUS
  Filled 2016-05-18: qty 250

## 2016-05-18 NOTE — Progress Notes (Signed)
Pharmacy - IV heparin  Assessment:    Please see note from Arlyn Dunning, PharmD on 2/22 for full details.  Briefly, 41 y.o. female on IV heparin for PE with aortic thrombus. Checking aPTT and heparin levels with recent Eliquis   0518 aptt=72 sec and HL=0.38, both at goal and correlating, no infusion or bleeding issues per RN  Plan:   Continue IV heparin at 2600 units/hr  Recheck heparin level later today to ensure stay in range (will stop checking aptts)   Dorrene German 05/18/2016, 6:36 AM

## 2016-05-18 NOTE — Progress Notes (Signed)
[facility]  PROGRESS NOTE  Alexa [name] [id number] DOB: [birth year] DOA: 05/14/2016 PCP: Pcp Not In System   Brief HPI:   41 y.o.femalewith medical history of hypertension and diabetes mellitus presented with a 5 day history of shortness of breath that began on 05/10/2015.  The patient was initially hypoxemic with oxygen saturation of 78% on room air. She was stabilized on 3 L with oxygen saturation 98%. Chest x-ray showed increased interstitial markings without distinct infiltrate. CT angiogram of the chest was obtained and showed pulmonary emboli in the distal right pulmonary artery and segmental emboli in the right upper lobe and right lower lobe segmental arteries. In addition, thrombus was noted in the patient's descending thoracic aorta. The patient was started on heparin drip. The patient remained hemodynamically stable with heart rate in the upper 90s. BMP showed potassium 3.1. WBC was 15.0. EKG shows sinus tachycardia with nonspecific T-wave changes and right axis deviation. PCCM was consulted because there was evidence of possible right heart strain.     Subjective   Denies shortness of breath this morning.   Assessment/Plan:     Acute respiratory failure with hypoxia -Secondary to submassive PE -Presently stable on 3 L -Wean oxygen for saturation greater than 92% -Pulmonary hygiene  - started on Duoneb nebulizer q 6 hr scheduled  Acute Submassive pulmonary embolus -Xarelto was discontinued, and  Patient started heparin protocol. Discussed with hematology/oncology, patient will be started on coumadin per pharmacy,  bubble study showed no PFO. -PCCM consult appreciated: see recommendations -Main pulmonary arteries enlarged representing a component of pulmonary hypertension -Pt is very high risk for VTE -Outpatient hematology consult and coagulation work up recommended at discharge. -Pt likely will need to be anticoagulated indefinitely.      Pulmonary  Edema -  Repeat chest x-ray today showed minimal improvement of the vascular congestion  Continue Lasix 40 mg po daily.  Aortic thrombi -Incidental finding of thrombus in the distal descending aorta -CT angiogram of abdomen / pelvis  showed Intraluminal aortic thrombus extends from the distal descending thoracic aorta into the supra celiac abdominal aorta, terminating just above the origin of the celiac axis. I called and discussed with vascular surgeon Dr. Trula Slade, who saw the patient and recommended to check efficacy of Xarelto. I called and discussed with hematology Dr. Sonny Dandy, recommended  to s tart patient on Coumadin as efficacy of Xarelto is questionable in this condition. Patient on  heparin and Coumadin per pharmacy  Lower extremity edema -05/14/2016 Venous duplex lower extremities inconclusive secondary to patient's body habitus  Hyperkalemia Potassium was  5.4, was given  1 dose of Kayexalate 15 g by mouth 1.today potassium is 4.3   Hypertension -BPs have been stable, well controlled and managed  Suspect OSA -  Pt at high risk for this and needs outpatient sleep study scheduled at discharge, she declined to try CPAP.   Leukocytosis -Likely stress demargination - urine culture grew multiple species   Diabetes mellitus type 2 -05/12/2016 Hemoglobin A1c 6.2 -Holding metformin -NovoLog sliding scale    DVT prophylaxis: Heparin  Code Status:  Full code  Family Communication: No family present at bedside  Disposition Plan: Pending coumadin to be therapeutic   Consultants:  PC CM  Procedures:  None    . heparin 2,600 Units/hr (05/17/16 2145)  Antibiotics:   Anti-infectives    None       Objective   Vitals:   05/18/16 0804 05/18/16 0806 05/18/16 0900 05/18/16 1000  BP:   Marland Kitchen)  108/48 138/61  Pulse:   (!) 110 (!) 108  Resp:      Temp:   99.2 F (37.3 C)   TempSrc:   Oral   SpO2: (!) 79% 97% 100%   Weight:      Height:         Intake/Output Summary (Last 24 hours) at 05/18/16 1334 Last data filed at 05/18/16 0904  Gross per 24 hour  Intake           516.65 ml  Output                0 ml  Net           516.65 ml   Filed Weights   05/14/16 1837  Weight: (!) 195.5 kg (431 lb)     Physical Examination:  General exam: Appears calm and comfortable. Respiratory system: Bilateral wheezing. Respiratory effort normal. Cardiovascular system:  RRR. No  murmurs, rubs, gallops. Bilateral edema of lower extremities  GI system: Abdomen is nondistended, soft and nontender. No organomegaly.  Central nervous system. No focal neurological deficits. 5 x 5 power in all extremities. Skin: No rashes, lesions or ulcers. Psychiatry: Alert, oriented x 3.Judgement and insight appear normal. Affect normal.    Data Reviewed: I have personally reviewed following labs and imaging studies  CBG:  Recent Labs Lab 05/17/16 1318 05/17/16 1705 05/17/16 2117 05/18/16 0729 05/18/16 1228  GLUCAP 108* 119* 118* 108* 119*    CBC:  Recent Labs Lab 05/14/16 1056 05/15/16 0638 05/16/16 0627 05/17/16 0553 05/18/16 0518  WBC 15.0* 13.6* 13.7* 11.4* 11.6*  NEUTROABS 11.8*  --   --   --   --   HGB 12.9 12.2 12.3 11.9* 12.1  HCT 43.4 42.0 43.0 43.0 42.8  MCV 84.6 86.4 86.9 87.9 88.4  PLT 309 298 348 363 [id number]    Basic Metabolic Panel:  Recent Labs Lab 05/14/16 1056 05/15/16 0638 05/16/16 0627 05/17/16 0553 05/18/16 0518  NA 136 139 138 135 138  K 3.1* 3.6 4.2 5.4* 4.3  CL 93* 94* 95* 91* 90*  CO2 34* 33* 37* 38* 39*  GLUCOSE 103* 122* 136* 147* 96  BUN 17 16 11 10 9   CREATININE 1.15* 1.06* 0.94 0.84 0.95  CALCIUM 8.3* 8.3* 8.8* 8.7* 8.5*  MG  --  2.0  --   --   --     Recent Results (from the past 240 hour(s))  MRSA PCR Screening     Status: None   Collection Time: 05/14/16  6:30 PM  Result Value Ref Range Status   MRSA by PCR NEGATIVE NEGATIVE Final    Comment:        The GeneXpert MRSA Assay  (FDA approved for NASAL specimens only), is one component of a comprehensive MRSA colonization surveillance program. It is not intended to diagnose MRSA infection nor to guide or monitor treatment for MRSA infections.   Urine culture     Status: Abnormal   Collection Time: 05/14/16  9:48 PM  Result Value Ref Range Status   Specimen Description URINE, CLEAN CATCH  Final   Special Requests NONE  Final   Culture MULTIPLE SPECIES PRESENT, SUGGEST RECOLLECTION (A)  Final   Report Status 05/16/2016 FINAL  Final     Liver Function Tests:  Recent Labs Lab 05/12/16 1330 05/16/16 0627 05/17/16 0553 05/18/16 0518  AST 32 15 24 15   ALT 30 16 14 14   ALKPHOS 79 51 44 41  BILITOT 0.8 0.5 1.6*  0.5  PROT 6.6 7.1 6.8 6.9  ALBUMIN 3.4* 3.1* 2.8* 2.9*    Cardiac Enzymes:  Recent Labs Lab 05/12/16 1330 05/14/16 2222 05/15/16 0638 05/15/16 1246  CKTOTAL 49  --   --   --   TROPONINI  --  0.04* 0.03* 0.03*   BNP (last 3 results)  Recent Labs  12/30/15 1144  BNP 22.3       Studies: No results found.  Scheduled Meds: . ferrous sulfate  325 mg Oral Q breakfast  . furosemide  40 mg Oral Daily  . insulin aspart  0-20 Units Subcutaneous TID WC  . insulin aspart  0-5 Units Subcutaneous QHS  . ipratropium-albuterol  3 mL Nebulization BID  . losartan  100 mg Oral Daily  . sodium chloride flush  3 mL Intravenous Q12H  . warfarin   Does not apply Once  . Warfarin - Pharmacist Dosing Inpatient   Does not apply q1800      Time spent: 25 min  [facility] Pager [phone]. If 7PM-7AM, please contact night-coverage at [email or web address], Office  [phone]  password [email or web address] 05/18/2016, 1:34 PM  LOS: 4 days

## 2016-05-18 NOTE — Progress Notes (Signed)
Pt refused CPAP qhs.   Education provided.  Pt wishes to sleep while wearing her nasal cannula as she did last night.  Pt encouraged to contact RT if she changes her mind.  CPAP in room.

## 2016-05-18 NOTE — Discharge Instructions (Signed)
Information on my medicine - Coumadin   (Warfarin)  This medication education was reviewed with me or my healthcare representative as part of my discharge preparation.  The pharmacist that spoke with me during my hospital stay was:  Kara Mead, [facility]  Why was Coumadin prescribed for you? Coumadin was prescribed for you because you have a blood clot or a medical condition that can cause an increased risk of forming blood clots. Blood clots can cause serious health problems by blocking the flow of blood to the heart, lung, or brain. Coumadin can prevent harmful blood clots from forming. As a reminder your indication for Coumadin is:   Pulmonary Embolism Treatment  What test will check on my response to Coumadin? While on Coumadin (warfarin) you will need to have an INR test regularly to ensure that your dose is keeping you in the desired range. The INR (international normalized ratio) number is calculated from the result of the laboratory test called prothrombin time (PT).  If an INR APPOINTMENT HAS NOT ALREADY BEEN MADE FOR YOU please schedule an appointment to have this lab work done by your health care provider within 7 days. Your INR goal is usually a number between:  2 to 3 or your provider may give you a more narrow range like 2-2.5.  Ask your health care provider during an office visit what your goal INR is.  What  do you need to  know  About  COUMADIN? Take Coumadin (warfarin) exactly as prescribed by your healthcare provider about the same time each day.  DO NOT stop taking without talking to the doctor who prescribed the medication.  Stopping without other blood clot prevention medication to take the place of Coumadin may increase your risk of developing a new clot or stroke.  Get refills before you run out.  What do you do if you miss a dose? If you miss a dose, take it as soon as you remember on the same day then continue your regularly scheduled regimen the next day.  Do not  take two doses of Coumadin at the same time.  Important Safety Information A possible side effect of Coumadin (Warfarin) is an increased risk of bleeding. You should call your healthcare provider right away if you experience any of the following: ? Bleeding from an injury or your nose that does not stop. ? Unusual colored urine (red or dark brown) or unusual colored stools (red or black). ? Unusual bruising for unknown reasons. ? A serious fall or if you hit your head (even if there is no bleeding).  Some foods or medicines interact with Coumadin (warfarin) and might alter your response to warfarin. To help avoid this: ? Eat a balanced diet, maintaining a consistent amount of Vitamin K. ? Notify your provider about major diet changes you plan to make. ? Avoid alcohol or limit your intake to 1 drink for women and 2 drinks for men per day. (1 drink is 5 oz. wine, 12 oz. beer, or 1.5 oz. liquor.)  Make sure that ANY health care provider who prescribes medication for you knows that you are taking Coumadin (warfarin).  Also make sure the healthcare provider who is monitoring your Coumadin knows when you have started a new medication including herbals and non-prescription products.  Coumadin (Warfarin)  Major Drug Interactions  Increased Warfarin Effect Decreased Warfarin Effect  Alcohol (large quantities) Antibiotics (esp. Septra/Bactrim, Flagyl, Cipro) Amiodarone (Cordarone) Aspirin (ASA) Cimetidine (Tagamet) Megestrol (Megace) NSAIDs (ibuprofen, naproxen, etc.) Piroxicam (  Feldene) °Propafenone (Rythmol SR) °Propranolol (Inderal) °Isoniazid (INH) °Posaconazole (Noxafil) Barbiturates (Phenobarbital) °Carbamazepine (Tegretol) °Chlordiazepoxide (Librium) °Cholestyramine (Questran) °Griseofulvin °Oral Contraceptives °Rifampin °Sucralfate (Carafate) °Vitamin K  ° °Coumadin® (Warfarin) Major Herbal Interactions  °Increased Warfarin Effect Decreased Warfarin Effect  °Garlic °Ginseng °Ginkgo biloba  Coenzyme Q10 °Green tea °St. John’s wort   ° °Coumadin® (Warfarin) FOOD Interactions  °Eat a consistent number of servings per week of foods HIGH in Vitamin K °(1 serving = ½ cup)  °Collards (cooked, or boiled & drained) °Kale (cooked, or boiled & drained) °Mustard greens (cooked, or boiled & drained) °Parsley *serving size only = ¼ cup °Spinach (cooked, or boiled & drained) °Swiss chard (cooked, or boiled & drained) °Turnip greens (cooked, or boiled & drained)  °Eat a consistent number of servings per week of foods MEDIUM-HIGH in Vitamin K °(1 serving = 1 cup)  °Asparagus (cooked, or boiled & drained) °Broccoli (cooked, boiled & drained, or raw & chopped) °Brussel sprouts (cooked, or boiled & drained) *serving size only = ½ cup °Lettuce, raw (green leaf, endive, romaine) °Spinach, raw °Turnip greens, raw & chopped  ° °These websites have more information on Coumadin (warfarin):  www.coumadin.com; °www.ahrq.gov/consumer/coumadin.htm; ° ° °

## 2016-05-18 NOTE — Progress Notes (Signed)
ANTICOAGULATION CONSULT NOTE - f/u Consult  Pharmacy Consult for Heparin IV and warfarin Indication: PE with aortic thrombi  Allergies  Allergen Reactions  . Penicillins Hives    Has patient had a PCN reaction causing immediate rash, facial/tongue/throat swelling, SOB or lightheadedness with hypotension: No Has patient had a PCN reaction causing severe rash involving mucus membranes or skin necrosis: No Has patient had a PCN reaction that required hospitalization No Has patient had a PCN reaction occurring within the last 10 years: No If all of the above answers are "NO", then may proceed with Cephalosporin use.    Patient Measurements: Listed height: 64 in Listed weight 193.4 kg Heparin Dosing Weight: 106 kg  Vital Signs: Temp: 99.2 F (37.3 C) (02/23 1511) Temp Source: Oral (02/23 1511) BP: 115/53 (02/23 1400) Pulse Rate: 109 (02/23 1400)  Labs:  Recent Labs  05/16/16 0627  05/17/16 0553 05/17/16 1142 05/17/16 2021 05/18/16 0518  HGB 12.3  --  11.9*  --   --  12.1  HCT 43.0  --  43.0  --   --  42.8  PLT 348  --  363  --   --  374  APTT  --   < > 43* 57* 27 72*  LABPROT  --   --   --   --   --  15.4*  INR  --   --   --   --   --  1.22  HEPARINUNFRC  --   < > >2.20* 1.08* 0.46 0.38  CREATININE 0.94  --  0.84  --   --  0.95  < > = values in this interval not displayed.  Estimated Creatinine Clearance (by C-G formula based on SCr of 0.95 mg/dL) Female: 140.7 mL/min Female: 169.6 mL/min   Medical History: Past Medical History:  Diagnosis Date  . Gestational diabetes   . Hypertension     Medications:  Scheduled:  . ferrous sulfate  325 mg Oral Q breakfast  . furosemide  40 mg Oral Daily  . insulin aspart  0-20 Units Subcutaneous TID WC  . insulin aspart  0-5 Units Subcutaneous QHS  . ipratropium-albuterol  3 mL Nebulization BID  . losartan  100 mg Oral Daily  . sodium chloride flush  3 mL Intravenous Q12H  . warfarin   Does not apply Once  . Warfarin -  Pharmacist Dosing Inpatient   Does not apply q1800    Assessment: Pharmacy is consulted to dose heparin in 41 yo female diagnosed with submassive PE with RH strain on CT as well as incidental aortic thrombi.  Not on any anticoagulants PTA. Pt was previously on Xarelto, but was not on med at current time.   2/20: Transitioned from IV heparin to Xarelto 2/21: Transition back to IV heparin. Patient has already received evening dose of Xarelto, therefore, will wait until tomorrow at 6AM to begin heparin (no bolus). 2/22: Starting warfarin; bridge with heparin until INR therapeutic  Today, 05/18/16  Heparin level now undetectable despite aggressive rate increase last night; DOAC effect appears to have dissipated at least.   APTT not ordered with this draw, but given unusual HL trend, ran anyway and returned subtherapeutic at 47 sec.  INR subtherapeutic and unchanged after warfarin x 1  CBC stable wnl  Per RN, no reported bleeding and no problems with IV site/line  No major drug-drug interactions with warfarin  Heart diet ordered, eating 100%    Goal of Therapy:  Heparin level 0.3-0.7 units/ml  PTT 66-102 seconds Monitor platelets by anticoagulation protocol: Yes   Plan:   Repeat heparin bolus from bag 5000 units x 1  Increase heparin IV to 3200 units/hr  Recheck heparin level in 8 hrs (to assure new SS concentrations)  Repeat warfarin 10mg  x 1 tonight at 1800  Daily CBC, INR, heparin level as appropriate  Today will be Day 2 of overlap of min 5 day overlap of IV heparin and warfarin   Reuel Boom, PharmD, BCPS Pager: [phone] 05/18/2016, 3:28 PM

## 2016-05-19 DIAGNOSIS — E669 Obesity, unspecified: Secondary | ICD-10-CM

## 2016-05-19 DIAGNOSIS — J209 Acute bronchitis, unspecified: Secondary | ICD-10-CM

## 2016-05-19 DIAGNOSIS — E11621 Type 2 diabetes mellitus with foot ulcer: Secondary | ICD-10-CM

## 2016-05-19 DIAGNOSIS — L97509 Non-pressure chronic ulcer of other part of unspecified foot with unspecified severity: Secondary | ICD-10-CM

## 2016-05-19 DIAGNOSIS — J9600 Acute respiratory failure, unspecified whether with hypoxia or hypercapnia: Secondary | ICD-10-CM

## 2016-05-19 DIAGNOSIS — I2609 Other pulmonary embolism with acute cor pulmonale: Principal | ICD-10-CM

## 2016-05-19 LAB — PROTIME-INR
INR: 1.25
Prothrombin Time: 15.7 seconds — ABNORMAL HIGH (ref 11.4–15.2)

## 2016-05-19 LAB — CBC
HEMATOCRIT: 41 % (ref 36.0–46.0)
HEMOGLOBIN: 11.6 g/dL — AB (ref 12.0–15.0)
MCH: 25 pg — AB (ref 26.0–34.0)
MCHC: 28.3 g/dL — AB (ref 30.0–36.0)
MCV: 88.4 fL (ref 78.0–100.0)
Platelets: 420 10*3/uL — ABNORMAL HIGH (ref 150–400)
RBC: 4.64 MIL/uL (ref 3.87–5.11)
RDW: 19.3 % — ABNORMAL HIGH (ref 11.5–15.5)
WBC: 11.8 10*3/uL — ABNORMAL HIGH (ref 4.0–10.5)

## 2016-05-19 LAB — GLUCOSE, CAPILLARY
GLUCOSE-CAPILLARY: 157 mg/dL — AB (ref 65–99)
GLUCOSE-CAPILLARY: 118 mg/dL — AB (ref 65–99)
Glucose-Capillary: 108 mg/dL — ABNORMAL HIGH (ref 65–99)
Glucose-Capillary: 101 mg/dL — ABNORMAL HIGH (ref 65–99)

## 2016-05-19 LAB — HEPARIN LEVEL (UNFRACTIONATED)
Heparin Unfractionated: 0.19 IU/mL — ABNORMAL LOW (ref 0.30–0.70)
Heparin Unfractionated: 0.67 IU/mL (ref 0.30–0.70)
Heparin Unfractionated: 0.73 IU/mL — ABNORMAL HIGH (ref 0.30–0.70)

## 2016-05-19 MED ORDER — WARFARIN SODIUM 5 MG PO TABS
15.0000 mg | ORAL_TABLET | Freq: Once | ORAL | Status: AC
  Administered 2016-05-19: 15 mg via ORAL
  Filled 2016-05-19: qty 3

## 2016-05-19 MED ORDER — HEPARIN (PORCINE) IN NACL 100-0.45 UNIT/ML-% IJ SOLN
3600.0000 [IU]/h | INTRAMUSCULAR | Status: DC
  Administered 2016-05-19 (×3): 3600 [IU]/h via INTRAVENOUS
  Filled 2016-05-19 (×2): qty 250

## 2016-05-19 MED ORDER — HEPARIN BOLUS VIA INFUSION
3000.0000 [IU] | Freq: Once | INTRAVENOUS | Status: AC
  Administered 2016-05-19: 3000 [IU] via INTRAVENOUS
  Filled 2016-05-19: qty 3000

## 2016-05-19 MED ORDER — AZITHROMYCIN 250 MG PO TABS
500.0000 mg | ORAL_TABLET | Freq: Every day | ORAL | Status: AC
Start: 2016-05-19 — End: 2016-05-19
  Administered 2016-05-19: 500 mg via ORAL
  Filled 2016-05-19: qty 2

## 2016-05-19 MED ORDER — HEPARIN (PORCINE) IN NACL 100-0.45 UNIT/ML-% IJ SOLN
3500.0000 [IU]/h | INTRAMUSCULAR | Status: DC
  Administered 2016-05-19 – 2016-05-21 (×5): 3500 [IU]/h via INTRAVENOUS
  Filled 2016-05-19 (×5): qty 250

## 2016-05-19 MED ORDER — GUAIFENESIN ER 600 MG PO TB12
1200.0000 mg | ORAL_TABLET | Freq: Two times a day (BID) | ORAL | Status: DC
  Administered 2016-05-19 – 2016-05-22 (×7): 1200 mg via ORAL
  Filled 2016-05-19 (×7): qty 2

## 2016-05-19 MED ORDER — AZITHROMYCIN 250 MG PO TABS
250.0000 mg | ORAL_TABLET | Freq: Every day | ORAL | Status: DC
  Administered 2016-05-20 – 2016-05-22 (×3): 250 mg via ORAL
  Filled 2016-05-19 (×3): qty 1

## 2016-05-19 NOTE — Progress Notes (Signed)
ANTICOAGULATION CONSULT NOTE - brief f/u Consult  Pharmacy Consult for Heparin IV and warfarin Indication: PE with aortic thrombi  See pharmacist note from earlier today for further detail.   Heparin level slightly supratherapeutic at 0.73 and increasing with rate at 3600 units/hr. No bleeding/complications reported.   Goal of Therapy:  Heparin level 0.3-0.7 units/ml  PTT 66-102 seconds Monitor platelets by anticoagulation protocol: Yes   Plan:   Reduce heparin infusion to 3500 units/hr  Recheck heparin level in 6 hours  Hershal Coria, PharmD, BCPS Pager: [phone] 05/19/2016 5:17 PM

## 2016-05-19 NOTE — Progress Notes (Signed)
ANTICOAGULATION CONSULT NOTE - f/u Consult  Pharmacy Consult for Heparin IV and warfarin Indication: PE with aortic thrombi  Allergies  Allergen Reactions  . Penicillins Hives    Has patient had a PCN reaction causing immediate rash, facial/tongue/throat swelling, SOB or lightheadedness with hypotension: No Has patient had a PCN reaction causing severe rash involving mucus membranes or skin necrosis: No Has patient had a PCN reaction that required hospitalization No Has patient had a PCN reaction occurring within the last 10 years: No If all of the above answers are "NO", then may proceed with Cephalosporin use.    Patient Measurements: Listed height: 64 in Listed weight 193.4 kg Heparin Dosing Weight: 106 kg  Vital Signs: Temp: 98.1 F (36.7 C) (02/23 2202) Temp Source: Axillary (02/23 2202) BP: 105/79 (02/23 2202) Pulse Rate: 100 (02/23 2202)  Labs:  Recent Labs  05/16/16 [id number] 05/17/16 0553  05/17/16 2021 05/18/16 0518 05/18/16 1344 05/18/16 2308  HGB 12.3 11.9*  --   --  12.1  --   --   HCT 43.0 43.0  --   --  42.8  --   --   PLT 348 363  --   --  374  --   --   APTT  --  43*  < > 27 72* 41*  --   LABPROT  --   --   --   --  15.4*  --   --   INR  --   --   --   --  1.22  --   --   HEPARINUNFRC  --  >2.20*  < > 0.46 0.38 <0.10* 0.19*  CREATININE 0.94 0.84  --   --  0.95  --   --   < > = values in this interval not displayed.  Estimated Creatinine Clearance (by C-G formula based on SCr of 0.95 mg/dL) Female: 140.7 mL/min Female: 169.6 mL/min   Medical History: Past Medical History:  Diagnosis Date  . Gestational diabetes   . Hypertension     Medications:  Scheduled:  . ferrous sulfate  325 mg Oral Q breakfast  . furosemide  40 mg Oral BID  . heparin  3,000 Units Intravenous Once  . insulin aspart  0-20 Units Subcutaneous TID WC  . insulin aspart  0-5 Units Subcutaneous QHS  . ipratropium-albuterol  3 mL Nebulization BID  . losartan  100 mg Oral Daily   . sodium chloride flush  3 mL Intravenous Q12H  . warfarin   Does not apply Once  . Warfarin - Pharmacist Dosing Inpatient   Does not apply q1800    Assessment: Pharmacy is consulted to dose heparin in 41 yo female diagnosed with submassive PE with RH strain on CT as well as incidental aortic thrombi.  Not on any anticoagulants PTA. Pt was previously on Xarelto, but was not on med at current time.   2/20: Transitioned from IV heparin to Xarelto 2/21: Transition back to IV heparin. Patient has already received evening dose of Xarelto, therefore, will wait until tomorrow at 6AM to begin heparin (no bolus). 2/22: Starting warfarin; bridge with heparin until INR therapeutic   2/23  Heparin level now undetectable despite aggressive rate increase last night; DOAC effect appears to have dissipated at least.   APTT not ordered with this draw, but given unusual HL trend, ran anyway and returned subtherapeutic at 47 sec.  INR subtherapeutic and unchanged after warfarin x 1  CBC stable wnl  2308 HL=0.19 still  below goal , no infusion or bleeding issues per RN     Goal of Therapy:  Heparin level 0.3-0.7 units/ml  PTT 66-102 seconds Monitor platelets by anticoagulation protocol: Yes   Plan:   Repeat heparin bolus from bag 3000 units x 1  Increase heparin IV to 3600 units/hr  Recheck heparin level in 6 hours  Daily CBC, INR, heparin level as appropriate  Dorrene German 05/19/2016, 1:01 AM

## 2016-05-19 NOTE — Progress Notes (Signed)
[facility]  PROGRESS NOTE  Alexa [name] [id number] DOB: [birth year] DOA: 05/14/2016 PCP: Pcp Not In System   Brief HPI:   41 y.o.femalewith medical history of hypertension and diabetes mellitus presented with a 5 day history of shortness of breath that began on 05/10/2015.  The patient was initially hypoxemic with oxygen saturation of 78% on room air. She was stabilized on 3 L with oxygen saturation 98%. Chest x-ray showed increased interstitial markings without distinct infiltrate. CT angiogram of the chest was obtained and showed pulmonary emboli in the distal right pulmonary artery and segmental emboli in the right upper lobe and right lower lobe segmental arteries. In addition, thrombus was noted in the patient's descending thoracic aorta. The patient was started on heparin drip. The patient remained hemodynamically stable with heart rate in the upper 90s. BMP showed potassium 3.1. WBC was 15.0. EKG shows sinus tachycardia with nonspecific T-wave changes and right axis deviation. PCCM was consulted because there was evidence of possible right heart strain.     Subjective   Patient says she has been coughing up green colored phlegm. CXR shows no pneumonia. Had fever 101 yesterday.   Assessment/Plan:     Acute respiratory failure with hypoxia -Secondary to submassive PE -Presently stable on 3 L -Wean oxygen for saturation greater than 92% -Pulmonary hygiene   Fever Blood culture x 2 obtained UA clear, CXR showed no pneumonia. Likely form sinusitis/bronchitis, started on zithromax.  ? Acute bronchitis - started on Duoneb nebulizer q 6 hr scheduled - will start Zithromax 500 mg po x 1 then 250 mg po daily x 4 days  Acute Submassive pulmonary embolus -Xarelto was discontinued, and  Patient started heparin protocol. Discussed with hematology/oncology, patient will be started on coumadin per pharmacy,  bubble study showed no PFO. -PCCM consult appreciated: see  recommendations -Main pulmonary arteries enlarged representing a component of pulmonary hypertension -Pt is very high risk for VTE -Outpatient hematology consult and coagulation work up recommended at discharge. -Pt likely will need to be anticoagulated indefinitely.      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 I called and discussed with vascular surgeon Dr. Trula Slade, who saw the patient and recommended to check efficacy of Xarelto. I called and discussed with hematology Dr. Sonny Dandy, recommended  to s tart patient on Coumadin as efficacy of Xarelto is questionable in this condition. Patient on  heparin and Coumadin per pharmacy  Lower extremity edema -05/14/2016 Venous duplex lower extremities inconclusive secondary to patient's body habitus  Hyperkalemia Potassium was  5.4, was given  1 dose of Kayexalate 15 g by mouth 1.today potassium is 4.3   Hypertension -BPs have been stable, well controlled and managed  Suspect OSA -  Pt at high risk for this and needs outpatient sleep study scheduled at discharge, she declined to try CPAP.    Diabetes mellitus type 2 -05/12/2016 Hemoglobin A1c 6.2 -Holding metformin -NovoLog sliding scale    DVT prophylaxis: Heparin  Code Status:  Full code  Family Communication: Discussed with mother at bedside on 05/18/16  Disposition Plan: Pending coumadin to become  therapeutic   Consultants:  PC  CM  Procedures:  None    . heparin 3,600 Units/hr (05/19/16 0733)  Antibiotics:   Anti-infectives    Start     Dose/Rate Route Frequency Ordered Stop    05/20/16 1000  azithromycin (ZITHROMAX) tablet 250 mg     250 mg Oral Daily 05/19/16 0917 05/24/16 0959   05/19/16 1000  azithromycin (ZITHROMAX) tablet 500 mg     500 mg Oral Daily 05/19/16 0917 05/19/16 1114       Objective   Vitals:   05/19/16 0628 05/19/16 0854 05/19/16 1000 05/19/16 1041  BP: 125/67 (!) 116/46 (!) 113/47   Pulse: (!) 107 (!) 110 (!) 101   Resp: (!) 22     Temp: 99 F (37.2 C)  99.6 F (37.6 C)   TempSrc: Oral  Oral   SpO2: 99% 100%  94%  Weight:      Height:        Intake/Output Summary (Last 24 hours) at 05/19/16 1229 Last data filed at 05/19/16 0944  Gross per 24 hour  Intake           1277.9 ml  Output              150 ml  Net           1127.9 ml   Filed Weights   05/14/16 1837  Weight: (!) 195.5 kg (431 lb)     Physical Examination:  General exam: Appears calm and comfortable. Respiratory system: Bilateral wheezing. Respiratory effort normal. Cardiovascular system:  RRR. No  murmurs, rubs, gallops. Bilateral edema of lower extremities  GI system: Abdomen is nondistended, soft and nontender. No organomegaly.  Central nervous system. No focal neurological deficits. 5 x 5 power in all extremities. Skin: No rashes, lesions or ulcers. Psychiatry: Alert, oriented x 3.Judgement and insight appear normal. Affect normal.    Data Reviewed: I have personally reviewed following labs and imaging studies  CBG:  Recent Labs Lab 05/18/16 1228 05/18/16 1726 05/18/16 2200 05/19/16 0748 05/19/16 1148  GLUCAP 119* 124* 111* 108* 157*    CBC:  Recent Labs Lab 05/14/16 1056 05/15/16 [id number] 05/16/16 0627 05/17/16 0553 05/18/16 0518 05/19/16 0814  WBC 15.0* 13.6* 13.7* 11.4* 11.6* 11.8*  NEUTROABS 11.8*  --   --   --   --   --   HGB 12.9 12.2 12.3 11.9* 12.1 11.6*  HCT 43.4 42.0 43.0 43.0 42.8 41.0  MCV 84.6 86.4 86.9 87.9 88.4 88.4  PLT 309 298 348 363 374 420*    Basic Metabolic Panel:  Recent Labs Lab 05/14/16 1056 05/15/16 0638  05/16/16 0627 05/17/16 0553 05/18/16 0518  NA 136 139 138 135 138  K 3.1* 3.6 4.2 5.4* 4.3  CL 93* 94* 95* 91* 90*  CO2 34* 33* 37* 38* 39*  GLUCOSE 103* 122* 136* 147* 96  BUN 17 16 11 10 9   CREATININE 1.15* 1.06* 0.94 0.84 0.95  CALCIUM 8.3* 8.3* 8.8* 8.7* 8.5*  MG  --  2.0  --   --   --     Recent Results (from the past 240 hour(s))  MRSA PCR Screening     Status: None   Collection Time: 05/14/16  6:30 PM  Result Value Ref Range Status   MRSA by PCR NEGATIVE NEGATIVE Final    Comment:        The GeneXpert MRSA Assay (FDA approved for NASAL specimens only), is one component of a comprehensive MRSA colonization surveillance program. It is not intended  to diagnose MRSA infection nor to guide or monitor treatment for MRSA infections.   Urine culture     Status: Abnormal   Collection Time: 05/14/16  9:48 PM  Result Value Ref Range Status   Specimen Description URINE, CLEAN CATCH  Final   Special Requests NONE  Final   Culture MULTIPLE SPECIES PRESENT, SUGGEST RECOLLECTION (A)  Final   Report Status 05/16/2016 FINAL  Final  Culture, blood (Routine X 2) w Reflex to ID Panel     Status: None (Preliminary result)   Collection Time: 05/18/16  3:47 PM  Result Value Ref Range Status   Specimen Description BLOOD RIGHT ARM  Final   Special Requests IN PEDIATRIC BOTTLE 4CC  Final   Culture   Final    NO GROWTH < 12 HOURS Performed at [facility], [place] [address] [place], [place] [address]    Report Status PENDING  Incomplete  Culture, blood (Routine X 2) w Reflex to ID Panel     Status: None (Preliminary result)   Collection Time: 05/18/16  3:47 PM  Result Value Ref Range Status   Specimen Description BLOOD LEFT HAND  Final   Special Requests BOTTLES DRAWN AEROBIC ONLY [place]  Final   Culture   Final    NO GROWTH < 12 HOURS Performed at [facility], [place] [address], [place], [place] [address]    Report Status PENDING  Incomplete     Liver Function  Tests:  Recent Labs Lab 05/12/16 1330 05/16/16 0627 05/17/16 0553 05/18/16 0518  AST 32 15 24 15   ALT 30 16 14 14   ALKPHOS 79 51 44 41  BILITOT 0.8 0.5 1.6* 0.5  PROT 6.6 7.1 6.8 6.9  ALBUMIN 3.4* 3.1* 2.8* 2.9*    Cardiac Enzymes:  Recent Labs Lab 05/12/16 1330 05/14/16 2222 05/15/16 0638 05/15/16 1246  CKTOTAL 49  --   --   --   TROPONINI  --  0.04* 0.03* 0.03*   BNP (last 3 results)  Recent Labs  12/30/15 1144  BNP 22.3       Studies: Dg Chest 2 View  Result Date: 05/18/2016 CLINICAL DATA:  41 y/o  F; shortness of breath and weakness. EXAM: CHEST  2 VIEW COMPARISON:  05/16/2016 chest radiograph. FINDINGS: Stable cardiomegaly. Stable pulmonary vascular congestion. No focal consolidation. No pleural effusion or pneumothorax. Mild degenerative changes of the spine. IMPRESSION: Stable cardiomegaly and pulmonary vascular congestion. No focal consolidation or effusion. Electronically Signed   By: Kristine Garbe M.D.   On: 05/18/2016 16:30    Scheduled Meds: . [START ON 05/20/2016] azithromycin  250 mg Oral Daily  . ferrous sulfate  325 mg Oral Q breakfast  . furosemide  40 mg Oral BID  . guaiFENesin  1,200 mg Oral BID  . insulin aspart  0-20 Units Subcutaneous TID WC  . insulin aspart  0-5 Units Subcutaneous QHS  . ipratropium-albuterol  3 mL Nebulization BID  . sodium chloride flush  3 mL Intravenous Q12H  . warfarin   Does not apply Once  . Warfarin - Pharmacist Dosing Inpatient   Does not apply q1800      Time spent: 25 min  [facility] Pager [phone]. If 7PM-7AM, please contact night-coverage at [email or web address], Office  [phone]  password [email or web address] 05/19/2016, 12:29 PM  LOS: 5 days

## 2016-05-19 NOTE — Progress Notes (Addendum)
ANTICOAGULATION CONSULT NOTE - f/u Consult  Pharmacy Consult for Heparin IV and warfarin Indication: PE with aortic thrombi       Allergies  Allergen Reactions  . Penicillins Hives    Has patient had a PCN reaction causing immediate rash, facial/tongue/throat swelling, SOB or lightheadedness with hypotension: No Has patient had a PCN reaction causing severe rash involving mucus membranes or skin necrosis: No Has patient had a PCN reaction that required hospitalization No Has patient had a PCN reaction occurring within the last 10 years: No If all of the above answers are "NO", then may proceed with Cephalosporin use.   Patient Measurements: Listed height: 64 in Listed weight 193.4 kg Heparin Dosing Weight: 106 kg  Vital Signs: Temp: 99 F (37.2 C) (02/24 0628) Temp Source: Oral (02/24 0628) BP: 116/46 (02/24 0854) Pulse Rate: 110 (02/24 0854)  Labs:  Recent Labs (last 2 labs)    Recent Labs  05/17/16 0553  05/17/16 2021 05/18/16 0518 05/18/16 1344 05/18/16 2308 05/19/16 0814  HGB 11.9*  --   --  12.1  --   --  11.6*  HCT 43.0  --   --  42.8  --   --  41.0  PLT 363  --   --  374  --   --  420*  APTT 43*  < > 27 72* 41*  --   --   LABPROT  --   --   --  15.4*  --   --  15.7*  INR  --   --   --  1.22  --   --  1.25  HEPARINUNFRC >2.20*  < > 0.46 0.38 <0.10* 0.19* 0.67  CREATININE 0.84  --   --  0.95  --   --   --   < > = values in this interval not displayed.    Estimated Creatinine Clearance (by C-G formula based on SCr of 0.95 mg/dL) Female: 140.7 mL/min Female: 169.6 mL/min  Medical History: Past Medical History:  Diagnosis Date  . Gestational diabetes   . Hypertension    Medications:  Scheduled:  . azithromycin  500 mg Oral Daily   Followed by  . [START ON 05/20/2016] azithromycin  250 mg Oral Daily  . ferrous sulfate  325 mg Oral Q breakfast  . furosemide  40 mg Oral BID  . guaiFENesin  1,200 mg Oral BID  . insulin aspart  0-20 Units  Subcutaneous TID WC  . insulin aspart  0-5 Units Subcutaneous QHS  . ipratropium-albuterol  3 mL Nebulization BID  . losartan  100 mg Oral Daily  . sodium chloride flush  3 mL Intravenous Q12H  . warfarin   Does not apply Once  . Warfarin - Pharmacist Dosing Inpatient   Does not apply q1800   Assessment: Pharmacy is consulted to dose heparin in 41 yo female diagnosed with submassive PE with RH strain on CT as well as incidental aortic thrombi.  Not on any anticoagulants PTA. Pt was previously on Xarelto, but was not on med at current time.   2/20: Transitioned from IV heparin to Xarelto 2/21: Transition back to IV heparin. Patient has already received evening dose of Xarelto, therefore, will wait until tomorrow at 6AM to begin heparin (no bolus). 2/22: Starting warfarin; bridge with heparin until INR therapeutic  Today, 05/19/2016  Heparin level (0.67),  therapeutic this am after re-bolus and rate increase, currently 3600 units/hr  INR subtherapeutic (1.25), unchanged after 2 x 10mg  doses  Concurrent Azithromycin - may increase INR (no effect seen at this point)  DOAC effect appears to have dissipated at least.   CBC stable wnl  Goal of Therapy:  Heparin level 0.3-0.7 units/ml  PTT 66-102 seconds INR 2-3 Monitor platelets by anticoagulation protocol: Yes   Plan:   Continue Heparin at 3600 units/hr  Recheck heparin level in 8 hours  Warfarin 15mg  today at 12N  Daily CBC, INR, heparin level   Minda Ditto PharmD Pager [phone] 05/19/2016, 10:05 AM

## 2016-05-20 LAB — BRAIN NATRIURETIC PEPTIDE: B NATRIURETIC PEPTIDE 5: 47.5 pg/mL (ref 0.0–100.0)

## 2016-05-20 LAB — CBC
HEMATOCRIT: 39.8 % (ref 36.0–46.0)
Hemoglobin: 11.4 g/dL — ABNORMAL LOW (ref 12.0–15.0)
MCH: 24.9 pg — AB (ref 26.0–34.0)
MCHC: 28.6 g/dL — AB (ref 30.0–36.0)
MCV: 86.9 fL (ref 78.0–100.0)
Platelets: 417 10*3/uL — ABNORMAL HIGH (ref 150–400)
RBC: 4.58 MIL/uL (ref 3.87–5.11)
RDW: 19.2 % — AB (ref 11.5–15.5)
WBC: 10.5 10*3/uL (ref 4.0–10.5)

## 2016-05-20 LAB — GLUCOSE, CAPILLARY
GLUCOSE-CAPILLARY: 193 mg/dL — AB (ref 65–99)
Glucose-Capillary: 136 mg/dL — ABNORMAL HIGH (ref 65–99)
Glucose-Capillary: 129 mg/dL — ABNORMAL HIGH (ref 65–99)
Glucose-Capillary: 172 mg/dL — ABNORMAL HIGH (ref 65–99)

## 2016-05-20 LAB — BASIC METABOLIC PANEL
Anion gap: 5 (ref 5–15)
BUN: 7 mg/dL (ref 6–20)
CALCIUM: 8.4 mg/dL — AB (ref 8.9–10.3)
CO2: 38 mmol/L — ABNORMAL HIGH (ref 22–32)
CREATININE: 0.71 mg/dL (ref 0.44–1.00)
Chloride: 93 mmol/L — ABNORMAL LOW (ref 101–111)
GFR calc Af Amer: >60 mL/min (ref >60)
GLUCOSE: 101 mg/dL — AB (ref 65–99)
Potassium: 4.8 mmol/L (ref 3.5–5.1)
Sodium: 136 mmol/L (ref 135–145)

## 2016-05-20 LAB — HEPARIN LEVEL (UNFRACTIONATED)
Heparin Unfractionated: 0.68 IU/mL (ref 0.30–0.70)
Heparin Unfractionated: 0.5 IU/mL (ref 0.30–0.70)

## 2016-05-20 LAB — PROTIME-INR
INR: 1.51
Prothrombin Time: 18.3 seconds — ABNORMAL HIGH (ref 11.4–15.2)

## 2016-05-20 MED ORDER — WARFARIN SODIUM 5 MG PO TABS
15.0000 mg | ORAL_TABLET | Freq: Once | ORAL | Status: AC
  Administered 2016-05-20: 15 mg via ORAL
  Filled 2016-05-20: qty 3

## 2016-05-20 MED ORDER — METHYLPREDNISOLONE SODIUM SUCC 125 MG IJ SOLR
60.0000 mg | Freq: Four times a day (QID) | INTRAMUSCULAR | Status: DC
  Administered 2016-05-20 – 2016-05-22 (×10): 60 mg via INTRAVENOUS
  Filled 2016-05-20 (×11): qty 2

## 2016-05-20 NOTE — Progress Notes (Signed)
RN called about patient desating in the low 80's while on CPAP. RT increased oxygenation from 5 liters to 8 liters. Patient sats are now 93%.. RT asked RN to call RT if patient has anymore issues.

## 2016-05-20 NOTE — Progress Notes (Signed)
ANTICOAGULATION CONSULT NOTE - brief f/u Consult  Pharmacy Consult for Heparin IV and warfarin Indication: PE with aortic thrombi  See pharmacist note from A. [facility] 2/24 for further detail.   Heparin level = 0.68 (therapeutic) on 3500 units/hr No bleeding/complications reported.   Goal of Therapy:  Heparin level 0.3-0.7 units/ml  PTT 66-102 seconds Monitor platelets by anticoagulation protocol: Yes   Plan:   Continue heparin drip at 3500 units/hr  Recheck heparin level today at 11am to ensure stays in therapeutic range   Dorrene German 05/19/2016 5:17 PM

## 2016-05-20 NOTE — Progress Notes (Signed)
[facility]  PROGRESS NOTE  Alexa [name] [id number] DOB: September 17, 1975 DOA: 05/14/2016 PCP: Pcp Not In System   Brief HPI:   41 y.o.femalewith medical history of hypertension and diabetes mellitus presented with a 5 day history of shortness of breath that began on 05/10/2015.  The patient was initially hypoxemic with oxygen saturation of 78% on room air. She was stabilized on 3 L with oxygen saturation 98%. Chest x-ray showed increased interstitial markings without distinct infiltrate. CT angiogram of the chest was obtained and showed pulmonary emboli in the distal right pulmonary artery and segmental emboli in the right upper lobe and right lower lobe segmental arteries. In addition, thrombus was noted in the patient's descending thoracic aorta. The patient was started on heparin drip. The patient remained hemodynamically stable with heart rate in the upper 90s. BMP showed potassium 3.1. WBC was 15.0. EKG shows sinus tachycardia with nonspecific T-wave changes and right axis deviation. PCCM was consulted because there was evidence of possible right heart strain.     Subjective   Patient Was short of breath last night with O2 Saturation dropped to 80s.   Assessment/Plan:     Acute respiratory failure with hypoxia -Secondary to submassive PE -Presently stable on 3 L -Wean oxygen for saturation greater than 92% -Pulmonary hygiene   Fever Blood culture x 2 obtained UA clear, CXR showed no pneumonia. Likely form sinusitis/bronchitis, started on zithromax.  ? Acute bronchitis - started on Duoneb nebulizer q 6 hr scheduled - will start Zithromax 500 mg po x 1 then 250 mg po daily x 4 days - Will add Solu-Medrol 60 mg IV every 6 hours  Acute Submassive pulmonary embolus -Xarelto was discontinued, and  Patient started heparin protocol. Discussed with hematology/oncology, patient will be started on coumadin per pharmacy,  bubble study showed no PFO. -PCCM consult  appreciated: see recommendations -Main pulmonary arteries enlarged representing a component of pulmonary hypertension -Pt is very high risk for VTE -Outpatient hematology consult and coagulation work up recommended at discharge. -Pt likely will need to be anticoagulated indefinitely.      Pulmonary Edema -  Repeat chest x-ray today showed minimal improvement of the vascular congestion  Continue Lasix 40 mg po BID  Aortic thrombi -Incidental finding of thrombus in the distal descending aorta -CT angiogram of abdomen / pelvis  showed Intraluminal aortic thrombus extends from the distal descending thoracic aorta into the supra celiac abdominal aorta, terminating just above the origin of the celiac axis. I called and discussed with vascular surgeon Dr. Trula Slade, who saw the patient and recommended to check efficacy of Xarelto. I called and discussed with hematology Dr. Sonny Dandy, recommended  to s tart patient on Coumadin as efficacy of Xarelto is questionable in this condition. Patient on  heparin and Coumadin per pharmacy  Lower extremity edema -05/14/2016 Venous duplex lower extremities inconclusive secondary to patient's body habitus  Hyperkalemia Potassium was  5.4, was given  1 dose of Kayexalate 15 g by mouth 1.today potassium is 4.3   Hypertension -BPs have been stable, well controlled and managed  Suspect OSA -  Pt at high risk for this and needs outpatient sleep study scheduled at discharge, she declined to try CPAP.    Diabetes mellitus type 2 -05/12/2016 Hemoglobin A1c 6.2 -Holding metformin -NovoLog sliding scale    DVT prophylaxis: Heparin  Code Status:  Full code  Family Communication: Discussed with mother at bedside on 05/18/16  Disposition Plan: Pending coumadin to become  therapeutic  Consultants:  PC CM  Procedures:  None    . heparin 3,500 Units/hr (05/20/16 0627)  Antibiotics:   Anti-infectives    Start     Dose/Rate Route Frequency  Ordered Stop   05/20/16 1000  azithromycin (ZITHROMAX) tablet 250 mg     250 mg Oral Daily 05/19/16 0917 05/24/16 0959   05/19/16 1000  azithromycin (ZITHROMAX) tablet 500 mg     500 mg Oral Daily 05/19/16 0917 05/19/16 1114       Objective   Vitals:   05/19/16 2222 05/20/16 0116 05/20/16 0517 05/20/16 1037  BP: (!) 129/58  128/74   Pulse: 98 (!) 106 97   Resp:  20    Temp: 99.2 F (37.3 C)  99.1 F (37.3 C)   TempSrc: Oral  Axillary   SpO2: 100% 100% 94% 99%  Weight:      Height:        Intake/Output Summary (Last 24 hours) at 05/20/16 1100 Last data filed at 05/20/16 0810  Gross per 24 hour  Intake              600 ml  Output                0 ml  Net              600 ml   Filed Weights   05/14/16 1837  Weight: (!) 195.5 kg (431 lb)     Physical Examination:  General exam: Appears calm and comfortable. Respiratory system: Bilateral wheezing. Respiratory effort normal. Cardiovascular system:  RRR. No  murmurs, rubs, gallops. Bilateral edema of lower extremities  GI system: Abdomen is nondistended, soft and nontender. No organomegaly.  Central nervous system. No focal neurological deficits. 5 x 5 power in all extremities. Skin: No rashes, lesions or ulcers. Psychiatry: Alert, oriented x 3.Judgement and insight appear normal. Affect normal.    Data Reviewed: I have personally reviewed following labs and imaging studies  CBG:  Recent Labs Lab 05/19/16 0748 05/19/16 1148 05/19/16 1659 05/19/16 2229 05/20/16 0808  GLUCAP 108* 157* 101* 118* 136*    CBC:  Recent Labs Lab 05/14/16 1056  05/16/16 0627 05/17/16 0553 05/18/16 0518 05/19/16 0814 05/20/16 0516  WBC 15.0*  < > 13.7* 11.4* 11.6* 11.8* 10.5  NEUTROABS 11.8*  --   --   --   --   --   --   HGB 12.9  < > 12.3 11.9* 12.1 11.6* 11.4*  HCT 43.4  < > 43.0 43.0 42.8 41.0 39.8  MCV 84.6  < > 86.9 87.9 88.4 88.4 86.9  PLT 309  < > 348 363 374 420* 417*  < > = values in this interval not  displayed.  Basic Metabolic Panel:  Recent Labs Lab 05/15/16 0638 05/16/16 0627 05/17/16 0553 05/18/16 0518 05/20/16 0516  NA 139 138 135 138 136  K 3.6 4.2 5.4* 4.3 4.8  CL 94* 95* 91* 90* 93*  CO2 33* 37* 38* 39* 38*  GLUCOSE 122* 136* 147* 96 101*  BUN 16 11 10 9 7   CREATININE 1.06* 0.94 0.84 0.95 0.71  CALCIUM 8.3* 8.8* 8.7* 8.5* 8.4*  MG 2.0  --   --   --   --     Recent Results (from the past 240 hour(s))  MRSA PCR Screening     Status: None   Collection Time: 05/14/16  6:30 PM  Result Value Ref Range Status   MRSA by PCR NEGATIVE NEGATIVE Final  Comment:        The GeneXpert MRSA Assay (FDA approved for NASAL specimens only), is one component of a comprehensive MRSA colonization surveillance program. It is not intended to diagnose MRSA infection nor to guide or monitor treatment for MRSA infections.   Urine culture     Status: Abnormal   Collection Time: 05/14/16  9:48 PM  Result Value Ref Range Status   Specimen Description URINE, CLEAN CATCH  Final   Special Requests NONE  Final   Culture MULTIPLE SPECIES PRESENT, SUGGEST RECOLLECTION (A)  Final   Report Status 05/16/2016 FINAL  Final  Culture, blood (Routine X 2) w Reflex to ID Panel     Status: None (Preliminary result)   Collection Time: 05/18/16  3:47 PM  Result Value Ref Range Status   Specimen Description BLOOD RIGHT ARM  Final   Special Requests IN PEDIATRIC BOTTLE 4CC  Final   Culture   Final    NO GROWTH < 12 HOURS Performed at [facility], [place] [address], [place], [place] [address]    Report Status PENDING  Incomplete  Culture, blood (Routine X 2) w Reflex to ID Panel     Status: None (Preliminary result)   Collection Time: 05/18/16  3:47 PM  Result Value Ref Range Status   Specimen Description BLOOD LEFT HAND  Final   Special Requests BOTTLES DRAWN AEROBIC ONLY [place]  Final   Culture   Final    NO GROWTH < 12 HOURS Performed at [facility], [place] [address]  [place], [place] [address]    Report Status PENDING  Incomplete     Liver Function Tests:  Recent Labs Lab 05/16/16 0627 05/17/16 0553 05/18/16 0518  AST 15 24 15   ALT 16 14 14   ALKPHOS 51 44 41  BILITOT 0.5 1.6* 0.5  PROT 7.1 6.8 6.9  ALBUMIN 3.1* 2.8* 2.9*    Cardiac Enzymes:  Recent Labs Lab 05/14/16 2222 05/15/16 0638 05/15/16 1246  TROPONINI 0.04* 0.03* 0.03*   BNP (last 3 results)  Recent Labs  12/30/15 1144 05/20/16 0516  BNP 22.3 47.5       Studies: Dg Chest 2 View  Result Date: 05/18/2016 CLINICAL DATA:  41 y/o  F; shortness of breath and weakness. EXAM: CHEST  2 VIEW COMPARISON:  05/16/2016 chest radiograph. FINDINGS: Stable cardiomegaly. Stable pulmonary vascular congestion. No focal consolidation. No pleural effusion or pneumothorax. Mild degenerative changes of the spine. IMPRESSION: Stable cardiomegaly and pulmonary vascular congestion. No focal consolidation or effusion. Electronically Signed   By: Kristine Garbe M.D.   On: 05/18/2016 16:30    Scheduled Meds: . azithromycin  250 mg Oral Daily  . ferrous sulfate  325 mg Oral Q breakfast  . furosemide  40 mg Oral BID  . guaiFENesin  1,200 mg Oral BID  . insulin aspart  0-20 Units Subcutaneous TID WC  . insulin aspart  0-5 Units Subcutaneous QHS  . ipratropium-albuterol  3 mL Nebulization BID  . methylPREDNISolone (SOLU-MEDROL) injection  60 mg Intravenous Q6H  . sodium chloride flush  3 mL Intravenous Q12H  . warfarin   Does not apply Once  . Warfarin - Pharmacist Dosing Inpatient   Does not apply q1800      Time spent: 25 min  [facility] Pager [phone]. If 7PM-7AM, please contact night-coverage at [email or web address], Office  [phone]  password [email or web address] 05/20/2016, 11:00 AM  LOS: 6 days

## 2016-05-20 NOTE — Progress Notes (Signed)
Called by RN because of pt's desat.  Pt placed on CPAP for the night.

## 2016-05-20 NOTE — Progress Notes (Signed)
ANTICOAGULATION CONSULT NOTE - f/u Consult  Pharmacy Consult for Heparin IV and warfarin Indication: PE with aortic thrombi       Allergies  Allergen Reactions  . Penicillins Hives    Has patient had a PCN reaction causing immediate rash, facial/tongue/throat swelling, SOB or lightheadedness with hypotension: No Has patient had a PCN reaction causing severe rash involving mucus membranes or skin necrosis: No Has patient had a PCN reaction that required hospitalization No Has patient had a PCN reaction occurring within the last 10 years: No If all of the above answers are "NO", then may proceed with Cephalosporin use.   Patient Measurements: Listed height: 64 in Listed weight 193.4 kg Heparin Dosing Weight: 106 kg  Vital Signs: Temp: 99 F (37.2 C) (02/24 0628) Temp Source: Oral (02/24 0628) BP: 116/46 (02/24 0854) Pulse Rate: 110 (02/24 0854)  Labs:  Recent Labs (last 2 labs)    Recent Labs  05/17/16 0553  05/17/16 2021 05/18/16 0518 05/18/16 1344 05/18/16 2308 05/19/16 0814  HGB 11.9*  --   --  12.1  --   --  11.6*  HCT 43.0  --   --  42.8  --   --  41.0  PLT 363  --   --  374  --   --  420*  APTT 43*  < > 27 72* 41*  --   --   LABPROT  --   --   --  15.4*  --   --  15.7*  INR  --   --   --  1.22  --   --  1.25  HEPARINUNFRC >2.20*  < > 0.46 0.38 <0.10* 0.19* 0.67  CREATININE 0.84  --   --  0.95  --   --   --   < > = values in this interval not displayed.    Estimated Creatinine Clearance (by C-G formula based on SCr of 0.95 mg/dL) Female: 140.7 mL/min Female: 169.6 mL/min  Medical History: Past Medical History:  Diagnosis Date  . Gestational diabetes   . Hypertension    Medications:  Scheduled:  . azithromycin  500 mg Oral Daily   Followed by  . [START ON 05/20/2016] azithromycin  250 mg Oral Daily  . ferrous sulfate  325 mg Oral Q breakfast  . furosemide  40 mg Oral BID  . guaiFENesin  1,200 mg Oral BID  . insulin aspart  0-20 Units  Subcutaneous TID WC  . insulin aspart  0-5 Units Subcutaneous QHS  . ipratropium-albuterol  3 mL Nebulization BID  . losartan  100 mg Oral Daily  . sodium chloride flush  3 mL Intravenous Q12H  . warfarin   Does not apply Once  . Warfarin - Pharmacist Dosing Inpatient   Does not apply q1800   Assessment: Pharmacy is consulted to dose heparin in 40 yo female diagnosed with submassive PE with RH strain on CT as well as incidental aortic thrombi.  Not on any anticoagulants PTA. Pt was previously on Xarelto, but was not on med at current time.   2/20: Transitioned from IV heparin to Xarelto 2/21: Transition back to IV heparin. Patient has already received evening dose of Xarelto, therefore, will wait until tomorrow at 6AM to begin heparin (no bolus). 2/22: Starting warfarin; bridge with heparin until INR therapeutic 2/24: Heparin level sl above therapeutic range, rate decreased   Today, 05/20/2016  Heparin level (0.50),  current rate 3500 units/hr  INR increasing, still subtherapeutic (1.51)  Concurrent  Azithromycin - may increase INR (no effect seen at this point)  DOAC effect appears to have dissipated  CBC stable wnl  Goal of Therapy:  Heparin level 0.3-0.7 units/ml  PTT 66-102 seconds INR 2-3 Monitor platelets by anticoagulation protocol: Yes   Plan:   Continue Heparin at 3500 units/hr  Warfarin 15mg  today at 1800  Daily CBC, INR, heparin level   Minda Ditto PharmD Pager [phone] 05/19/2016, 10:05 AM

## 2016-05-20 NOTE — Progress Notes (Signed)
Paged Kscharr on call NP, about the Pt's desating on CPAP in the low 80's, no order given at this time. PageRT  Amy who adjusted the Pt's setting from 5 to 8 liters, I will continue  to monitor, pt's current 02 is 92 %.

## 2016-05-21 LAB — CBC
HCT: 42.3 % (ref 36.0–46.0)
Hemoglobin: 12.2 g/dL (ref 12.0–15.0)
MCH: 24.8 pg — AB (ref 26.0–34.0)
MCHC: 28.8 g/dL — AB (ref 30.0–36.0)
MCV: 86.2 fL (ref 78.0–100.0)
PLATELETS: 454 10*3/uL — AB (ref 150–400)
RBC: 4.91 MIL/uL (ref 3.87–5.11)
RDW: 18.8 % — ABNORMAL HIGH (ref 11.5–15.5)
WBC: 11.8 10*3/uL — ABNORMAL HIGH (ref 4.0–10.5)

## 2016-05-21 LAB — GLUCOSE, CAPILLARY
GLUCOSE-CAPILLARY: 168 mg/dL — AB (ref 65–99)
GLUCOSE-CAPILLARY: 159 mg/dL — AB (ref 65–99)
Glucose-Capillary: 142 mg/dL — ABNORMAL HIGH (ref 65–99)
Glucose-Capillary: 171 mg/dL — ABNORMAL HIGH (ref 65–99)

## 2016-05-21 LAB — PROTIME-INR
INR: 1.86
PROTHROMBIN TIME: 21.7 s — AB (ref 11.4–15.2)

## 2016-05-21 LAB — HEPARIN LEVEL (UNFRACTIONATED)
HEPARIN UNFRACTIONATED: 1.44 [IU]/mL — AB (ref 0.30–0.70)
Heparin Unfractionated: 0.85 IU/mL — ABNORMAL HIGH (ref 0.30–0.70)

## 2016-05-21 MED ORDER — WARFARIN SODIUM 7.5 MG PO TABS
15.0000 mg | ORAL_TABLET | Freq: Once | ORAL | Status: AC
  Administered 2016-05-21: 15 mg via ORAL
  Filled 2016-05-21: qty 2

## 2016-05-21 MED ORDER — HEPARIN (PORCINE) IN NACL 100-0.45 UNIT/ML-% IJ SOLN
3300.0000 [IU]/h | INTRAMUSCULAR | Status: DC
  Administered 2016-05-21 (×2): 3300 [IU]/h via INTRAVENOUS
  Filled 2016-05-21 (×4): qty 250

## 2016-05-21 MED ORDER — HEPARIN (PORCINE) IN NACL 100-0.45 UNIT/ML-% IJ SOLN
2900.0000 [IU]/h | INTRAMUSCULAR | Status: DC
  Administered 2016-05-22: 3200 [IU]/h via INTRAVENOUS
  Administered 2016-05-22: 2900 [IU]/h via INTRAVENOUS
  Filled 2016-05-21 (×3): qty 250

## 2016-05-21 NOTE — Progress Notes (Signed)
ANTICOAGULATION CONSULT NOTE - f/u Consult  Pharmacy Consult for Heparin IV and warfarin Indication: PE with aortic thrombi       Allergies  Allergen Reactions  . Penicillins Hives    Has patient had a PCN reaction causing immediate rash, facial/tongue/throat swelling, SOB or lightheadedness with hypotension: No Has patient had a PCN reaction causing severe rash involving mucus membranes or skin necrosis: No Has patient had a PCN reaction that required hospitalization No Has patient had a PCN reaction occurring within the last 10 years: No If all of the above answers are "NO", then may proceed with Cephalosporin use.   Patient Measurements: Listed height: 64 in Listed weight 193.4 kg Heparin Dosing Weight: 106 kg  Vital Signs: Temp: 99 F (37.2 C) (02/24 0628) Temp Source: Oral (02/24 0628) BP: 116/46 (02/24 0854) Pulse Rate: 110 (02/24 0854)  Labs:  Recent Labs (last 2 labs)    Recent Labs  05/17/16 0553  05/17/16 2021 05/18/16 0518 05/18/16 1344 05/18/16 2308 05/19/16 0814  HGB 11.9*  --   --  12.1  --   --  11.6*  HCT 43.0  --   --  42.8  --   --  41.0  PLT 363  --   --  374  --   --  420*  APTT 43*  < > 27 72* 41*  --   --   LABPROT  --   --   --  15.4*  --   --  15.7*  INR  --   --   --  1.22  --   --  1.25  HEPARINUNFRC >2.20*  < > 0.46 0.38 <0.10* 0.19* 0.67  CREATININE 0.84  --   --  0.95  --   --   --   < > = values in this interval not displayed.    Estimated Creatinine Clearance (by C-G formula based on SCr of 0.95 mg/dL) Female: 140.7 mL/min Female: 169.6 mL/min  Medical History: Past Medical History:  Diagnosis Date  . Gestational diabetes   . Hypertension    Medications:  Scheduled:  . azithromycin  500 mg Oral Daily   Followed by  . [START ON 05/20/2016] azithromycin  250 mg Oral Daily  . ferrous sulfate  325 mg Oral Q breakfast  . furosemide  40 mg Oral BID  . guaiFENesin  1,200 mg Oral BID  . insulin aspart  0-20 Units  Subcutaneous TID WC  . insulin aspart  0-5 Units Subcutaneous QHS  . ipratropium-albuterol  3 mL Nebulization BID  . losartan  100 mg Oral Daily  . sodium chloride flush  3 mL Intravenous Q12H  . warfarin   Does not apply Once  . Warfarin - Pharmacist Dosing Inpatient   Does not apply q1800   Assessment: Pharmacy is consulted to dose heparin in 41 yo female diagnosed with submassive PE with RH strain on CT as well as incidental aortic thrombi.  Not on any anticoagulants PTA. Pt was previously on Xarelto, but was not on med at current time.   2/20: Transitioned from IV heparin to Xarelto 2/21: Transition back to IV heparin. Patient has already received evening dose of Xarelto, therefore, will wait until tomorrow at 6AM to begin heparin (no bolus). 2/22: Starting warfarin; bridge with heparin until INR therapeutic  Today, 05/21/2016:  Heparin supra-therapeutic today (HL=1.44),  current rate 3500 units/hr.  Therapeutic x2 yesterday, however discussed with patient & nurse and IV heparin has been infusing a  without issues and lab drawn from opposite arm from heparin infusion therefore will treat as accurate level.   Day#5 warfarin overlap.  INR rising to goal, still subtherapeutic (1.86)  Concurrent Azithromycin - may increase INR   DOAC effect appears to have dissipated  CBC stable wnl  Cardiac diet ordered- eating 100%  Goal of Therapy:  Heparin level 0.3-0.7 units/ml  INR 2-3 Monitor platelets by anticoagulation protocol: Yes   Plan:   Hold Heparin x2hrs then resume at 3300 units/hr  Repeat 6hr heparin level at 1800  Continue Heparin overlap until INR therapeutic x24 hrs  Warfarin 15mg  today at 1800  Daily CBC, INR, heparin level   Netta Cedars, PharmD, BCPS Pager: [phone] 05/21/2016@9 :33 AM

## 2016-05-21 NOTE — Progress Notes (Signed)
Pharmacy: Re- heparin  Patient's a 41 y.o F on heparin for PE and aortic thrombi.  Heparin level now back supra-therapeutic at 0.85 (goal 0.3-0.7) despite rate decreased to 3300 units/hr earlier today.  Plan: - decrease heparin drip to 3200 units/hr - check 6 hr heparin level  Dia Sitter, PharmD, BCPS 05/21/2016 7:11 PM

## 2016-05-21 NOTE — Progress Notes (Signed)
[facility]  PROGRESS NOTE  MARIELIS LABELLE [id number] DOB: [birth year] DOA: 05/14/2016 PCP: Pcp Not In System   Brief HPI:   41 y.o.femalewith medical history of hypertension and diabetes mellitus presented with a 5 day history of shortness of breath that began on 05/10/2015.  The patient was initially hypoxemic with oxygen saturation of 78% on room air. She was stabilized on 3 L with oxygen saturation 98%. Chest x-ray showed increased interstitial markings without distinct infiltrate. CT angiogram of the chest was obtained and showed pulmonary emboli in the distal right pulmonary artery and segmental emboli in the right upper lobe and right lower lobe segmental arteries. In addition, thrombus was noted in the patient's descending thoracic aorta. The patient was started on heparin drip. The patient remained hemodynamically stable with heart rate in the upper 90s. BMP showed potassium 3.1. WBC was 15.0. EKG shows sinus tachycardia with nonspecific T-wave changes and right axis deviation. PCCM was consulted because there was evidence of possible right heart strain.     Subjective   Patient is breathing better after starting Solumedrol and Zithromax.   Assessment/Plan:     Acute respiratory failure with hypoxia -Secondary to submassive PE -Presently stable on 3 L -Wean oxygen for saturation greater than 92% -Pulmonary hygiene   Fever Resolved, Blood culture x 2 have been negative to date. UA clear, CXR showed no pneumonia. Likely form sinusitis/bronchitis, started on zithromax.  ? Acute bronchitis - started on Duoneb nebulizer q 6 hr scheduled - on Zithromax 500 mg po x 1 then 250 mg po daily x 4 days -  Solu-Medrol 60 mg IV every 6 hours  Acute Submassive pulmonary embolus -Xarelto was discontinued, and  Patient started heparin protocol. Discussed with hematology/oncology, patient will be started on coumadin per pharmacy,  bubble study showed no PFO. -PCCM consult  appreciated: see recommendations -Main pulmonary arteries enlarged representing a component of pulmonary hypertension -Pt is very high risk for VTE -Outpatient hematology consult and coagulation work up recommended at discharge. -Pt likely will need to be anticoagulated indefinitely.      Pulmonary Edema -  Repeat chest x-ray today showed minimal improvement of the vascular congestion  Continue Lasix 40 mg po BID  Aortic thrombi -Incidental finding of thrombus in the distal descending aorta -CT angiogram of abdomen / pelvis  showed Intraluminal aortic thrombus extends from the distal descending thoracic aorta into the supra celiac abdominal aorta, terminating just above the origin of the celiac axis. I called and discussed with vascular surgeon Dr. Trula Slade, who saw the patient and recommended to check efficacy of Xarelto. I called and discussed with hematology Dr. Sonny Dandy, recommended  to s tart patient on Coumadin as efficacy of Xarelto is questionable in this condition. Patient on  heparin and Coumadin per pharmacy. INR is 1.85  Lower extremity edema -05/14/2016 Venous duplex lower extremities inconclusive secondary to patient's body habitus  Hyperkalemia Potassium was  5.4, was given  1 dose of Kayexalate 15 g by mouth 1.today potassium is 4.3   Hypertension -BPs have been stable, well controlled and managed  Suspect OSA -  Pt at high risk for this and needs outpatient sleep study scheduled at discharge, she declined to try CPAP.    Diabetes mellitus type 2 -05/12/2016 Hemoglobin A1c 6.2 -Holding metformin -NovoLog sliding scale    DVT prophylaxis: Heparin  Code Status:  Full code  Family Communication: Discussed with mother at bedside on 05/18/16  Disposition Plan: Pending coumadin to become  therapeutic  Consultants:  PC CM  Procedures:  None    . heparin 3,300 Units/hr (05/21/16 1134)  Antibiotics:   Anti-infectives    Start     Dose/Rate Route  Frequency Ordered Stop   05/20/16 1000  azithromycin (ZITHROMAX) tablet 250 mg     250 mg Oral Daily 05/19/16 0917 05/24/16 0959   05/19/16 1000  azithromycin (ZITHROMAX) tablet 500 mg     500 mg Oral Daily 05/19/16 0917 05/19/16 1114       Objective   Vitals:   05/20/16 2122 05/20/16 2217 05/21/16 0600 05/21/16 0834  BP: 139/63  (!) 111/54   Pulse: 88  84   Resp: 18  18   Temp: 99.1 F (37.3 C)  99 F (37.2 C)   TempSrc: Oral  Oral   SpO2: 98% 97% 95% 98%  Weight:      Height:        Intake/Output Summary (Last 24 hours) at 05/21/16 1300 Last data filed at 05/21/16 0952  Gross per 24 hour  Intake           912.25 ml  Output                0 ml  Net           912.25 ml   Filed Weights   05/14/16 1837  Weight: (!) 195.5 kg (431 lb)     Physical Examination:  General exam: Appears calm and comfortable. Respiratory system: Bilateral wheezing. Respiratory effort normal. Cardiovascular system:  RRR. No  murmurs, rubs, gallops. Bilateral edema of lower extremities  GI system: Abdomen is nondistended, soft and nontender. No organomegaly.  Central nervous system. No focal neurological deficits. 5 x 5 power in all extremities. Skin: No rashes, lesions or ulcers. Psychiatry: Alert, oriented x 3.Judgement and insight appear normal. Affect normal.    Data Reviewed: I have personally reviewed following labs and imaging studies  CBG:  Recent Labs Lab 05/20/16 1109 05/20/16 1628 05/20/16 2118 05/21/16 0726 05/21/16 1139  GLUCAP 129* 172* 193* 142* 171*    CBC:  Recent Labs Lab 05/17/16 0553 05/18/16 0518 05/19/16 0814 05/20/16 0516 05/21/16 0530  WBC 11.4* 11.6* 11.8* 10.5 11.8*  HGB 11.9* 12.1 11.6* 11.4* 12.2  HCT 43.0 42.8 41.0 39.8 42.3  MCV 87.9 88.4 88.4 86.9 86.2  PLT 363 374 420* 417* 454*    Basic Metabolic Panel:  Recent Labs Lab 05/15/16 0638 05/16/16 0627 05/17/16 0553 05/18/16 0518 05/20/16 0516  NA 139 138 135 138 136  K 3.6 4.2  5.4* 4.3 4.8  CL 94* 95* 91* 90* 93*  CO2 33* 37* 38* 39* 38*  GLUCOSE 122* 136* 147* 96 101*  BUN 16 11 10 9 7   CREATININE 1.06* 0.94 0.84 0.95 0.71  CALCIUM 8.3* 8.8* 8.7* 8.5* 8.4*  MG 2.0  --   --   --   --     Recent Results (from the past 240 hour(s))  MRSA PCR Screening     Status: None   Collection Time: 05/14/16  6:30 PM  Result Value Ref Range Status   MRSA by PCR NEGATIVE NEGATIVE Final    Comment:        The GeneXpert MRSA Assay (FDA approved for NASAL specimens only), is one component of a comprehensive MRSA colonization surveillance program. It is not intended to diagnose MRSA infection nor to guide or monitor treatment for MRSA infections.   Urine culture     Status: Abnormal  Collection Time: 05/14/16  9:48 PM  Result Value Ref Range Status   Specimen Description URINE, CLEAN CATCH  Final   Special Requests NONE  Final   Culture MULTIPLE SPECIES PRESENT, SUGGEST RECOLLECTION (A)  Final   Report Status 05/16/2016 FINAL  Final  Culture, blood (Routine X 2) w Reflex to ID Panel     Status: None (Preliminary result)   Collection Time: 05/18/16  3:47 PM  Result Value Ref Range Status   Specimen Description BLOOD RIGHT ARM  Final   Special Requests IN PEDIATRIC BOTTLE 4CC  Final   Culture   Final    NO GROWTH 2 DAYS Performed at [facility], [place] [address], [place], [place] [address]    Report Status PENDING  Incomplete  Culture, blood (Routine X 2) w Reflex to ID Panel     Status: None (Preliminary result)   Collection Time: 05/18/16  3:47 PM  Result Value Ref Range Status   Specimen Description BLOOD LEFT HAND  Final   Special Requests BOTTLES DRAWN AEROBIC ONLY [place]  Final   Culture   Final    NO GROWTH 2 DAYS Performed at [facility], [place] [address], [place], [place] [address]    Report Status PENDING  Incomplete     Liver Function Tests:  Recent Labs Lab 05/16/16 0627 05/17/16 0553 05/18/16 0518  AST 15 24 15   ALT 16 14 14    ALKPHOS 51 44 41  BILITOT 0.5 1.6* 0.5  PROT 7.1 6.8 6.9  ALBUMIN 3.1* 2.8* 2.9*    Cardiac Enzymes:  Recent Labs Lab 05/14/16 2222 05/15/16 0638 05/15/16 1246  TROPONINI 0.04* 0.03* 0.03*   BNP (last 3 results)  Recent Labs  12/30/15 1144 05/20/16 0516  BNP 22.3 47.5       Studies: No results found.  Scheduled Meds: . azithromycin  250 mg Oral Daily  . ferrous sulfate  325 mg Oral Q breakfast  . furosemide  40 mg Oral BID  . guaiFENesin  1,200 mg Oral BID  . insulin aspart  0-20 Units Subcutaneous TID WC  . insulin aspart  0-5 Units Subcutaneous QHS  . ipratropium-albuterol  3 mL Nebulization BID  . methylPREDNISolone (SOLU-MEDROL) injection  60 mg Intravenous Q6H  . sodium chloride flush  3 mL Intravenous Q12H  . warfarin   Does not apply Once  . Warfarin - Pharmacist Dosing Inpatient   Does not apply q1800      Time spent: 25 min  [facility] Pager [phone]. If 7PM-7AM, please contact night-coverage at [email or web address], Office  [phone]  password [email or web address] 05/21/2016, 1:00 PM  LOS: 7 days

## 2016-05-21 NOTE — Progress Notes (Signed)
Pt refused CPAP qhs.  Education provided.  Pt wishes to remain on nasal cannula while sleeping.  Wishes for cpap to remain in room in case she changes her mind.

## 2016-05-22 LAB — CBC
HCT: 41 % (ref 36.0–46.0)
Hemoglobin: 12 g/dL (ref 12.0–15.0)
MCH: 24.6 pg — ABNORMAL LOW (ref 26.0–34.0)
MCHC: 29.3 g/dL — AB (ref 30.0–36.0)
MCV: 84.2 fL (ref 78.0–100.0)
Platelets: 487 10*3/uL — ABNORMAL HIGH (ref 150–400)
RBC: 4.87 MIL/uL (ref 3.87–5.11)
RDW: 18.6 % — AB (ref 11.5–15.5)
WBC: 14.6 10*3/uL — ABNORMAL HIGH (ref 4.0–10.5)

## 2016-05-22 LAB — GLUCOSE, CAPILLARY
GLUCOSE-CAPILLARY: 165 mg/dL — AB (ref 65–99)
GLUCOSE-CAPILLARY: 166 mg/dL — AB (ref 65–99)
Glucose-Capillary: 200 mg/dL — ABNORMAL HIGH (ref 65–99)

## 2016-05-22 LAB — HEPARIN LEVEL (UNFRACTIONATED)
HEPARIN UNFRACTIONATED: 0.82 [IU]/mL — AB (ref 0.30–0.70)
Heparin Unfractionated: 0.86 IU/mL — ABNORMAL HIGH (ref 0.30–0.70)

## 2016-05-22 LAB — PROTIME-INR
INR: 2.17
PROTHROMBIN TIME: 24.5 s — AB (ref 11.4–15.2)

## 2016-05-22 MED ORDER — LOSARTAN POTASSIUM 100 MG PO TABS: 100.0000 mg | ORAL_TABLET | Freq: Every day | ORAL | 2 refills | Status: DC

## 2016-05-22 MED ORDER — WARFARIN SODIUM 5 MG PO TABS
15.0000 mg | ORAL_TABLET | Freq: Once | ORAL | Status: AC
  Administered 2016-05-22: 15 mg via ORAL
  Filled 2016-05-22: qty 3

## 2016-05-22 MED ORDER — GUAIFENESIN ER 600 MG PO TB12: 1200.0000 mg | ORAL_TABLET | Freq: Two times a day (BID) | ORAL | 0 refills | Status: DC

## 2016-05-22 MED ORDER — PREDNISONE 10 MG PO TABS: ORAL_TABLET | ORAL | 0 refills | Status: DC

## 2016-05-22 MED ORDER — HEPARIN (PORCINE) IN NACL 100-0.45 UNIT/ML-% IJ SOLN
2500.0000 [IU]/h | INTRAMUSCULAR | Status: AC
  Filled 2016-05-22: qty 250

## 2016-05-22 MED ORDER — AZITHROMYCIN 250 MG PO TABS: 250.0000 mg | ORAL_TABLET | Freq: Every day | ORAL | 0 refills | Status: DC

## 2016-05-22 MED ORDER — WARFARIN SODIUM 10 MG PO TABS: 10.0000 mg | ORAL_TABLET | Freq: Every day | ORAL | 2 refills | Status: DC

## 2016-05-22 MED ORDER — ENOXAPARIN SODIUM 150 MG/ML ~~LOC~~ SOLN
200.0000 mg | Freq: Once | SUBCUTANEOUS | Status: AC
  Administered 2016-05-22: 200 mg via SUBCUTANEOUS
  Filled 2016-05-22: qty 1.33

## 2016-05-22 MED ORDER — IPRATROPIUM-ALBUTEROL 0.5-2.5 (3) MG/3ML IN SOLN: 3.0000 mL | Freq: Four times a day (QID) | RESPIRATORY_TRACT | 0 refills | Status: DC | PRN

## 2016-05-22 NOTE — Progress Notes (Signed)
ANTICOAGULATION CONSULT NOTE - Follow Up Consult  Pharmacy Consult for Heparin Indication: PE and aortic thrombi  Allergies  Allergen Reactions  . Penicillins Hives    Has patient had a PCN reaction causing immediate rash, facial/tongue/throat swelling, SOB or lightheadedness with hypotension: No Has patient had a PCN reaction causing severe rash involving mucus membranes or skin necrosis: No Has patient had a PCN reaction that required hospitalization No Has patient had a PCN reaction occurring within the last 10 years: No If all of the above answers are "NO", then may proceed with Cephalosporin use.    Patient Measurements: Height: 5\' 4"  (162.6 cm) Weight: (!) 431 lb (195.5 kg) IBW/kg (Calculated) : 54.7 Heparin Dosing Weight:   Vital Signs: Temp: 99.3 F (37.4 C) (02/26 2210) Temp Source: Oral (02/26 2210) BP: 132/79 (02/26 2210) Pulse Rate: 90 (02/26 2210)  Labs:  Recent Labs  05/20/16 0516  05/21/16 0530 05/21/16 1757 05/22/16 0146 05/22/16 0150  HGB 11.4*  --  12.2  --   --  12.0  HCT 39.8  --  42.3  --   --  41.0  PLT 417*  --  454*  --   --  487*  LABPROT 18.3*  --  21.7*  --   --  24.5*  INR 1.51  --  1.86  --   --  2.17  HEPARINUNFRC  --   < > 1.44* 0.85* 0.86*  --   CREATININE 0.71  --   --   --   --   --   < > = values in this interval not displayed.  Estimated Creatinine Clearance (by C-G formula based on SCr of 0.71 mg/dL) Female: 167.1 mL/min Female: 201.3 mL/min   Medications:  Infusions:  . heparin 3,200 Units/hr (05/22/16 0021)    Assessment: Patient with still high heparin level after small rate reduction.  No heparin issues per RN.  Goal of Therapy:  Heparin level 0.3-0.7 units/ml Monitor platelets by anticoagulation protocol: Yes   Plan:  Decrease heparin to 2900 units/hr Recheck level at 0900  Tyler Deis, Shea Stakes [place] 05/22/2016,2:58 AM

## 2016-05-22 NOTE — Discharge Summary (Addendum)
Physician Discharge Summary  ZHANNA BAAL [id number] DOB: 11/11/1975 DOA: 05/14/2016  PCP: Pcp Not In System  Admit date: 05/14/2016 Discharge date: 05/22/2016  Time spent: 25* minutes  Recommendations for Outpatient Follow-up:  1. Follow up [place] urgent care in one week to check PT/INR 2. To go home on 2 liters oxygen via nasal canula   Discharge Diagnoses:  Active Problems:   Diabetes ([place])   Benign essential HTN   Class 3 obesity due to excess calories with serious comorbidity and body mass index (BMI) greater than or equal to 70 in adult [facility])   Acute pulmonary embolus (HCC)   Acute respiratory failure with hypoxia (HCC)   Acute bronchitis   Discharge Condition: Stable  Diet recommendation: Low salt diet  Filed Weights   05/14/16 1837  Weight: (!) 195.5 kg (431 lb)    History of present illness:  41 y.o.femalewith medical history of hypertension and diabetes mellitus presented with a 5 day history of shortness of breath that began on 05/10/2015. The patient was initially hypoxemic with oxygen saturation of 78% on room air. She was stabilized on 3 L with oxygen saturation 98%. Chest x-ray showed increased interstitial markings without distinct infiltrate. CT angiogram of the chest was obtained and showed pulmonary emboli in the distal right pulmonary artery and segmental emboli in the right upper lobe and right lower lobe segmental arteries. In addition, thrombus was noted in the patient's descending thoracic aorta. The patient was started on heparin drip. The patient remained hemodynamically stable with heart rate in the upper 90s. BMP showed potassium 3.1. WBC was 15.0. EKG shows sinus tachycardia with nonspecific T-wave changes and right axis deviation. PCCM was consulted because there was evidence of possible right heart strain  Hospital Course:   Acute respiratory failure with hypoxia -Secondary to submassive PE -Presently stable on 2 L oxygen via nasal  canula   Fever Resolved, Blood culture x 2 have been negative to date. UA clear, CXR showed no pneumonia. Likely form sinusitis/bronchitis, started on zithromax.   ? Acute bronchitis - started on Duoneb nebulizer q 6 hr scheduled - She has improved, will be discharged on Zithromax for one more day and Prednisone taper  Acute Submassive pulmonary embolus -Xarelto was discontinued, and  Patient started heparin protocol. Discussed with hematology/oncology, patient will be started on coumadin per pharmacy,  bubble study showed no PFO. -Main pulmonary arteries enlarged representing a component of pulmonary hypertension -Outpatient hematology consult and coagulation work up recommended as outpatient -Pt likely will need to be anticoagulated indefinitely.  - Today INR is therapeutic 2.17, will discharge on Coumadin 10 mg po daily, will need to check PT/INR in 3 days. - She will be given one dose of Lovenox tonight before discharge to complete 24h overlap of heparin.   Pulmonary Edema/  Grade 2 diastolic dysfunction Echo shows EF [id number] Grade 2 diastolic dysfunction Repeat chest x-ray today showed minimal improvement of the vascular congestion  Continue Lasix 40 mg po daily  Aortic thrombi -Incidental finding of thrombus in the distal descending aorta -CT angiogram of abdomen / pelvis  showed Intraluminal aortic thrombus extends from the distal descending thoracic aorta into the supra celiac abdominal aorta, terminating just above the origin of the celiac axis. I called and discussed with vascular surgeon Dr. Trula Slade, who saw the patient and recommended to check efficacy of Xarelto. I called and discussed with hematology Dr. Sonny Dandy, recommended  to s tart patient on Coumadin as efficacy of Xarelto is questionable  in this condition. Patient on  heparin and Coumadin per pharmacy. INR is 2.14  Lower extremity edema -05/14/2016 Venous duplex lower extremities inconclusive secondary  to patient's body habitus  Hyperkalemia resolved Potassium was  5.4, was given  1 dose of Kayexalate 15 g by mouth 1.today potassium is 4.8   Hypertension -BPs have been stable, well controlled and managed - will restart Losartan 100 mg po daily, will discontinue   Suspect OSA-  Pt at high risk for this and needs outpatient sleep study  Diabetes mellitus type 2 -05/12/2016 Hemoglobin A1c 6.2 -continue metformin   Procedures:  Echo bubble study  Consultations:  Vascular surgery   Pulmonary  Discharge Exam: Vitals:   05/22/16 0518 05/22/16 0845  BP: (!) 146/79   Pulse: 84 95  Resp: 20 18  Temp: 97.9 F (36.6 C)     General: Appear in no acute distress Cardiovascular: RRR, S1S2 Respiratory: decreased breath sounds bilaterally  Discharge Instructions   Discharge Instructions    Diet - low sodium heart healthy    Complete by:  As directed    Increase activity slowly    Complete by:  As directed      Current Discharge Medication List    START taking these medications   Details  azithromycin (ZITHROMAX) 250 MG tablet Take 1 tablet (250 mg total) by mouth daily. Qty: 1 each, Refills: 0    guaiFENesin (MUCINEX) 600 MG 12 hr tablet Take 2 tablets (1,200 mg total) by mouth 2 (two) times daily. Qty: 10 tablet, Refills: 0    ipratropium-albuterol (DUONEB) 0.5-2.5 (3) MG/3ML SOLN Take 3 mLs by nebulization every 6 (six) hours as needed. Qty: 360 mL, Refills: 0    losartan (COZAAR) 100 MG tablet Take 1 tablet (100 mg total) by mouth daily. Qty: 30 tablet, Refills: 2    predniSONE (DELTASONE) 10 MG tablet Prednisone 40 mg po daily x 1 day then Prednisone 30 mg po daily x 1 day then Prednisone 20 mg po daily x 1 day then Prednisone 10 mg daily x 1 day then stop... Qty: 10 tablet, Refills: 0    warfarin (COUMADIN) 10 MG tablet Take 1 tablet (10 mg total) by mouth daily. Qty: 30 tablet, Refills: 2      CONTINUE these medications which have NOT  CHANGED   Details  ferrous sulfate 325 (65 FE) MG tablet Take 325 mg by mouth daily with breakfast.    furosemide (LASIX) 40 MG tablet Take 1 tablet (40 mg total) by mouth daily. Qty: 30 tablet, Refills: 3   Associated Diagnoses: Edema, unspecified type    metFORMIN (GLUCOPHAGE) 500 MG tablet Take 1 tablet (500 mg total) by mouth 2 (two) times daily with a meal. Qty: 180 tablet, Refills: 3   Associated Diagnoses: Encounter for medication counseling    nystatin (MYCOSTATIN/NYSTOP) powder Apply topically 4 (four) times daily. Qty: 60 g, Refills: 1      STOP taking these medications     losartan-hydrochlorothiazide (HYZAAR) 100-12.5 MG tablet        Allergies  Allergen Reactions  . Penicillins Hives    Has patient had a PCN reaction causing immediate rash, facial/tongue/throat swelling, SOB or lightheadedness with hypotension: No Has patient had a PCN reaction causing severe rash involving mucus membranes or skin necrosis: No Has patient had a PCN reaction that required hospitalization No Has patient had a PCN reaction occurring within the last 10 years: No If all of the above answers are "NO", then may  proceed with Cephalosporin use.   Follow-up Information    [facility] [place]. Go in 1 week.   Why:  For follow up appointment 05/29/16 @ 9:00am Contact information: [address] [place] [phone]           The results of significant diagnostics from this hospitalization (including imaging, microbiology, ancillary and laboratory) are listed below for reference.    Significant Diagnostic Studies: Dg Chest 2 View  Result Date: 05/18/2016 CLINICAL DATA:  41 y/o  F; shortness of breath and weakness. EXAM: CHEST  2 VIEW COMPARISON:  05/16/2016 chest radiograph. FINDINGS: Stable cardiomegaly. Stable pulmonary vascular congestion. No focal consolidation. No pleural effusion or pneumothorax. Mild degenerative changes of the spine.  IMPRESSION: Stable cardiomegaly and pulmonary vascular congestion. No focal consolidation or effusion. Electronically Signed   By: Kristine Garbe M.D.   On: 05/18/2016 16:30   Dg Chest 2 View  Result Date: 05/16/2016 CLINICAL DATA:  Pulmonary edema.  Shortness of breath. EXAM: CHEST  2 VIEW COMPARISON:  Chest radiograph and CTA 05/14/2016 FINDINGS: The cardiomediastinal silhouette is unchanged. Pulmonary vascular congestion is slightly improved. The interstitial markings remain mildly increased diffusely. There is no evidence of new confluent airspace opacity, sizeable pleural effusion, or pneumothorax. IMPRESSION: Minimally improved pulmonary vascular congestion. Electronically Signed   By: Logan Bores M.D.   On: 05/16/2016 09:03   Ct Angio Chest Pe W Or Wo Contrast  Result Date: 05/14/2016 CLINICAL DATA:  Shortness of breath. Bilateral leg swelling. On Xarelto. EXAM: CT ANGIOGRAPHY CHEST WITH CONTRAST TECHNIQUE: Multidetector CT imaging of the chest was performed using the standard protocol during bolus administration of intravenous contrast. Multiplanar CT image reconstructions and MIPs were obtained to evaluate the vascular anatomy. CONTRAST:  100 mL Isovue 370 COMPARISON:  Chest radiograph 05/14/2016 FINDINGS: Cardiovascular: There is nonocclusive embolus in the distal right main pulmonary artery extending into the right upper lobe, interlobar, and right lower lobe arteries. There is also evidence of segmental and subsegmental emboli in the right upper and lower lobes, although evaluation is limited by patient body habitus and motion artifact. No central left-sided emboli are identified. Evaluation of left lower lobe segmental and subsegmental vessels is limited by motion. There is flattening of the interventricular septum. The right ventricle to left ventricle ratio is abnormally elevated at 1.26. The main pulmonary artery is enlarged, measuring 4.2 cm in diameter. The heart is mildly  enlarged. No pericardial effusion. The thoracic aorta is normal in caliber, however there is prominent thrombus within the predominantly distal descending thoracic aorta extending into the proximal abdominal aorta, incompletely visualized. Mediastinum/Nodes: No enlarged axillary, mediastinal, or hilar lymph nodes. Lungs/Pleura: No pleural effusion or pneumothorax. Evaluation of the lungs is limited by motion artifact. There is a small amount of patchy opacity in the basilar lower lobes bilaterally, greater on the left. Upper Abdomen: Partially visualized aortic thrombus as described above. Musculoskeletal: No acute osseous abnormality or suspicious osseous lesion. Review of the MIP images confirms the above findings. IMPRESSION: 1. Positive for acute PE with CT evidence of right heart strain (RV/LV Ratio = 1.26) consistent with at least submassive (intermediate risk) PE. The presence of right heart strain has been associated with an increased risk of morbidity and mortality. Given the main pulmonary artery enlargement, there may be a component of underlying chronic pulmonary arterial hypertension contributing to the right ventricular enlargement. 2. Thrombus in the distal descending thoracic aorta and proximal abdominal aorta, incompletely visualized. CTA of the abdomen  and pelvis is recommended to further assess the extent of thrombus and patency of the visceral arteries. 3. Mild bibasilar lung opacities which may reflect atelectasis, infection, or early infarcts. Critical Value/emergent results were called by telephone at the time of interpretation on 05/14/2016 at 2:54 pm to Dr. Fredia Sorrow , who verbally acknowledged these results. Electronically Signed   By: Logan Bores M.D.   On: 05/14/2016 15:10   Dg Chest Port 1 View  Result Date: 05/14/2016 CLINICAL DATA:  Left leg pain. Shortness of breath for the past week. History of hypertension, diabetes, current smoker. EXAM: PORTABLE CHEST 1 VIEW COMPARISON:   Chest x-ray of December 30, 2015 FINDINGS: The lungs are well-expanded. The interstitial markings are increased. The pulmonary vascularity is engorged. The cardiac silhouette is mildly enlarged. There is no pleural effusion. There is no alveolar infiltrate. The mediastinum is normal in width. IMPRESSION: Findings consistent with miles pulmonary edema likely of cardiac cause. No alveolar pneumonia. When the patient can tolerate the procedure, a PA and lateral chest x-ray would be useful. Electronically Signed   By: David  Martinique M.D.   On: 05/14/2016 13:06   Ct Angio Abd/pel W/ And/or W/o  Result Date: 05/15/2016 CLINICAL DATA:  5 day history of shortness of breath that began on 05/10/2015. The patient was initially hypoxemic with oxygen saturation of 78% on room air. She was stabilized on 3 L with oxygen saturation 98%. Chest x-ray showed increased interstitial markings without distinct infiltrate. CT angiogram of the chest was obtained and showed pulmonary emboli in the distal right pulmonary artery and segmental emboli in the right upper lobe and right lower lobe segmental arteries. In addition, thrombus was noted in the patient's descending thoracic aorta. Thrombus in the distal descending thoracic aorta and proximalabdominal aorta, incompletely visualized. The patient was started on heparin drip. The patient remained hemodynamically stable with heart rate in the upper 90s. BMP showed potassium 3.1. WBC was 15.0. EKG shows sinus tachycardia with nonspecific T-wave changes and right axis deviation. PCCM was consulted because there was evidence of possible right heart strain EXAM: CTA ABDOMEN AND PELVIS WITH CONTRAST TECHNIQUE: Multidetector CT imaging of the abdomen and pelvis was performed using the standard protocol during bolus administration of intravenous contrast. Multiplanar reconstructed images and MIPs were obtained and reviewed to evaluate the vascular anatomy. CONTRAST:  100 mL Isovue 370 IV  COMPARISON:  CT chest from earlier the same day FINDINGS: VASCULAR Aorta: Intraluminal thrombus in the visualized distal descending thoracic aorta protruding centrally down the lumen of the proximal abdominal aorta, terminating just above the origin of the celiac axis. The more distal abdominal aorta is unremarkable. No dissection, aneurysm, or stenosis. Celiac: Patent without evidence of aneurysm, dissection, vasculitis or significant stenosis. Distal branching unremarkable. SMA: Patent without evidence of aneurysm, dissection, vasculitis or significant stenosis. Renals: Both renal arteries are patent without evidence of aneurysm, dissection, vasculitis, fibromuscular dysplasia or significant stenosis. IMA: Patent without evidence of aneurysm, dissection, vasculitis or significant stenosis. Inflow: Patent without evidence of aneurysm, dissection, vasculitis or significant stenosis. Proximal Outflow: Incomplete opacification, without definite pathology Veins: Dedicated venous phase imaging not obtained. Review of the MIP images confirms the above findings. NON-VASCULAR Lower chest: Bilateral pulmonary emboli as described on prior study. Patchy peripheral airspace opacities in both lung bases stable. No pleural or pericardial effusion. Hepatobiliary: No focal liver abnormality is seen. No gallstones, gallbladder wall thickening, or biliary dilatation. Pancreas: Unremarkable. No pancreatic ductal dilatation or surrounding inflammatory changes. Spleen: 2 wedge-shaped  peripheral regions of decreased enhancement may represent sequela of early scan timing versus subcapsular infarcts. Adrenals/Urinary Tract: Normal adrenals. Bilateral striated nephrograms on this early phase imaging. No urolithiasis or hydronephrosis. Urinary bladder incompletely distended. Stomach/Bowel: Stomach is nondistended. Small bowel nondilated. Appendix not discretely identified. The colon is nondilated, unremarkable. Lymphatic: Prominent inguinal  adenopathy left greater than right, up to 13 mm short axis diameter. Bilateral iliac chain adenopathy up to 15 mm . Prominent subcentimeter left para-aortic and aortocaval retroperitoneal lymph nodes. No mesenteric adenopathy. Reproductive: Uterus and bilateral adnexa are unremarkable. Other: No ascites.  No free air. Musculoskeletal: Degenerative disc disease L2-S1. Negative for fracture or worrisome bone lesion. IMPRESSION: VASCULAR 1. 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 2. No definite distal emboli are identified. NON-VASCULAR *Possible splenic infarcts versus inhomogeneous early phase enhancement. *Bilateral inguinal and iliac adenopathy, possibly reactive but nonspecific. Electronically Signed   By: Lucrezia Europe M.D.   On: 05/15/2016 13:48    Microbiology: Recent Results (from the past 240 hour(s))  MRSA PCR Screening     Status: None   Collection Time: 05/14/16  6:30 PM  Result Value Ref Range Status   MRSA by PCR NEGATIVE NEGATIVE Final    Comment:        The GeneXpert MRSA Assay (FDA approved for NASAL specimens only), is one component of a comprehensive MRSA colonization surveillance program. It is not intended to diagnose MRSA infection nor to guide or monitor treatment for MRSA infections.   Urine culture     Status: Abnormal   Collection Time: 05/14/16  9:48 PM  Result Value Ref Range Status   Specimen Description URINE, CLEAN CATCH  Final   Special Requests NONE  Final   Culture MULTIPLE SPECIES PRESENT, SUGGEST RECOLLECTION (A)  Final   Report Status 05/16/2016 FINAL  Final  Culture, blood (Routine X 2) w Reflex to ID Panel     Status: None (Preliminary result)   Collection Time: 05/18/16  3:47 PM  Result Value Ref Range Status   Specimen Description BLOOD RIGHT ARM  Final   Special Requests IN PEDIATRIC BOTTLE 4CC  Final   Culture   Final     NO GROWTH 4 DAYS Performed at [facility], [place] [address] [place], [place] [address]    Report Status PENDING  Incomplete  Culture, blood (Routine X 2) w Reflex to ID Panel     Status: None (Preliminary result)   Collection Time: 05/18/16  3:47 PM  Result Value Ref Range Status   Specimen Description BLOOD LEFT HAND  Final   Special Requests BOTTLES DRAWN AEROBIC ONLY [place]  Final   Culture   Final    NO GROWTH 4 DAYS Performed at [facility], [place] [address] [place], [place] [address]    Report Status PENDING  Incomplete     Labs: Basic Metabolic Panel:  Recent Labs Lab 05/16/16 0627 05/17/16 0553 05/18/16 0518 05/20/16 0516  NA 138 135 138 136  K 4.2 5.4* 4.3 4.8  CL 95* 91* 90* 93*  CO2 37* 38* 39* 38*  GLUCOSE 136* 147* 96 101*  BUN 11 10 9 7   CREATININE 0.94 0.84 0.95 0.71  CALCIUM 8.8* 8.7* 8.5* 8.4*   Liver Function Tests:  Recent Labs Lab 05/16/16 0627 05/17/16 0553 05/18/16 0518  AST 15 24 15   ALT 16 14 14   ALKPHOS  51 44 41  BILITOT 0.5 1.6* 0.5  PROT 7.1 6.8 6.9  ALBUMIN 3.1* 2.8* 2.9*   No results for input(s): LIPASE, AMYLASE in the last 168 hours. No results for input(s): AMMONIA in the last 168 hours. CBC:  Recent Labs Lab 05/18/16 0518 05/19/16 0814 05/20/16 0516 05/21/16 0530 05/22/16 0150  WBC 11.6* 11.8* 10.5 11.8* 14.6*  HGB 12.1 11.6* 11.4* 12.2 12.0  HCT 42.8 41.0 39.8 42.3 41.0  MCV 88.4 88.4 86.9 86.2 84.2  PLT 374 420* 417* 454* 487*   Cardiac Enzymes:  Recent Labs Lab 05/15/16 1246  TROPONINI 0.03*   BNP: BNP (last 3 results)  Recent Labs  12/30/15 1144 05/20/16 0516  BNP 22.3 47.5    ProBNP (last 3 results) No results for input(s): PROBNP in the last 8760 hours.  CBG:  Recent Labs Lab 05/21/16 0726 05/21/16 1139 05/21/16 1642 05/21/16 2207 05/22/16 0746  GLUCAP 142* 171* 168* 159* 165*       Signed:  Eleonore Chiquito S MD.  [facility] 05/22/2016, 11:19 AM

## 2016-05-22 NOTE — Progress Notes (Signed)
ANTICOAGULATION CONSULT NOTE - f/u Consult  Pharmacy Consult for Heparin IV and warfarin Indication: PE with aortic thrombi       Allergies  Allergen Reactions  . Penicillins Hives    Has patient had a PCN reaction causing immediate rash, facial/tongue/throat swelling, SOB or lightheadedness with hypotension: No Has patient had a PCN reaction causing severe rash involving mucus membranes or skin necrosis: No Has patient had a PCN reaction that required hospitalization No Has patient had a PCN reaction occurring within the last 10 years: No If all of the above answers are "NO", then may proceed with Cephalosporin use.   Patient Measurements: Listed height: 64 in Listed weight 193.4 kg Heparin Dosing Weight: 106 kg  Vital Signs: Temp: 99 F (37.2 C) (02/24 0628) Temp Source: Oral (02/24 0628) BP: 116/46 (02/24 0854) Pulse Rate: 110 (02/24 0854)  Labs:  Recent Labs (last 2 labs)    Recent Labs  05/17/16 0553  05/17/16 2021 05/18/16 0518 05/18/16 1344 05/18/16 2308 05/19/16 0814  HGB 11.9*  --   --  12.1  --   --  11.6*  HCT 43.0  --   --  42.8  --   --  41.0  PLT 363  --   --  374  --   --  420*  APTT 43*  < > 27 72* 41*  --   --   LABPROT  --   --   --  15.4*  --   --  15.7*  INR  --   --   --  1.22  --   --  1.25  HEPARINUNFRC >2.20*  < > 0.46 0.38 <0.10* 0.19* 0.67  CREATININE 0.84  --   --  0.95  --   --   --   < > = values in this interval not displayed.    Estimated Creatinine Clearance (by C-G formula based on SCr of 0.95 mg/dL) Female: 140.7 mL/min Female: 169.6 mL/min  Medical History: Past Medical History:  Diagnosis Date  . Gestational diabetes   . Hypertension    Medications:  Scheduled:  . azithromycin  500 mg Oral Daily   Followed by  . [START ON 05/20/2016] azithromycin  250 mg Oral Daily  . ferrous sulfate  325 mg Oral Q breakfast  . furosemide  40 mg Oral BID  . guaiFENesin  1,200 mg Oral BID  . insulin aspart  0-20 Units  Subcutaneous TID WC  . insulin aspart  0-5 Units Subcutaneous QHS  . ipratropium-albuterol  3 mL Nebulization BID  . losartan  100 mg Oral Daily  . sodium chloride flush  3 mL Intravenous Q12H  . warfarin   Does not apply Once  . Warfarin - Pharmacist Dosing Inpatient   Does not apply q1800   Assessment: Pharmacy is consulted to dose heparin in 41 yo female diagnosed with submassive PE with RH strain on CT as well as incidental aortic thrombi.  Not on any anticoagulants PTA. Pt was previously on Xarelto, but was not on med at current time.   2/20: Transitioned from IV heparin to Xarelto 2/21: Transition back to IV heparin. Patient has already received evening dose of Xarelto, therefore, will wait until tomorrow at 6AM to begin heparin (no bolus). 2/22: Starting warfarin; bridge with heparin until INR therapeutic  Today, 05/22/2016:  Heparin level remains supra-therapeutic & essentially unchanged today (HL=0.82) despite rate decreases.  Currently on heparin 2900 units/hr.  Discussed with patient & nurse and IV  heparin has been infusing a without issues and lab drawn from opposite arm of heparin infusion.   Day#6 warfarin overlap.  INR therapeutic x1 today.  Concurrent Azithromycin day#4/5 - may increase INR   CBC stable wnl  Cardiac diet ordered- eating 100%  Goal of Therapy:  Heparin level 0.3-0.7 units/ml  INR 2-3 Monitor platelets by anticoagulation protocol: Yes   Plan:   Hold Heparin x2hrs then resume at 2500 units/hr  At 4p d/c heparin.  Then 1hr later give Lovenox 200mg  sq x1 to complete 24h overlap of heparin-->warfarin  Warfarin 15mg  today at 1700 then discharge home on Coumadin 10mg  po daily  F/U PT/INR as outpatient later this week  Plan discussed with patient & Dr Milon Dikes, PharmD, BCPS Pager: [phone] 05/22/2016@10 :03 AM

## 2016-05-22 NOTE — Care Management Note (Signed)
Case Management Note  Patient Details  Name: Alexa [name] MRN: [id number] Date of Birth: 06/08/1975  Subjective/Objective:                  Acute respiratory failure with hypoxia  Action/Plan: Discharge plannning Expected Discharge Date:  05/22/16               Expected Discharge Plan:  [place]  In-House Referral:     Discharge planning Services  CM Consult  Post Acute Care Choice:  Home Health Choice offered to:  Patient  DME Arranged:  Oxygen DME Agency:  [place] Arranged:  RN [facility] Agency:  [place]  Status of Service:  Completed, signed off  If discussed at [place] of Stay Meetings, dates discussed:    Additional Comments: CM met with pt who is seen by [facility].  Cm has given pt [facility]  As a resource to establish herself with a PCP other than the [facility] (also on [facility]).  Cm offered choice of home health agency. Pt chooses [facility] to render [facility] for INR check to be called to [facility] (per DC summary).  Referral called to [facility] rep, Joelene Millin who will also arrange for transport tank home for O2 setup after 17:00 this evening.  No other CM needs were communicated. Dellie Catholic, RN 05/22/2016, 12:47 PM

## 2016-05-22 NOTE — Progress Notes (Signed)
SATURATION QUALIFICATIONS: (This note is used to comply with regulatory documentation for home oxygen)  Patient Saturations on Room Air at Rest = 90%  Patient Saturations on Room Air while Ambulating = 85%  Patient Saturations on 2 Liters of oxygen while Ambulating = 95%  Please briefly explain why patient needs home oxygen: Oxygen saturations drop to 85% on room air when ambulating.

## 2016-05-23 LAB — CULTURE, BLOOD (ROUTINE X 2)
CULTURE: NO GROWTH
Culture: NO GROWTH

## 2016-05-25 NOTE — Telephone Encounter (Signed)
Holly from [facility] call to give [place] pt lab results of PT/INR PT is 35.5 INR is 3.0

## 2016-05-25 NOTE — Telephone Encounter (Signed)
It appears you were the last person to evaluate this patient. Would you review request from Chrys Racer and results Levada Dy noted and contact [place] to list you as PCP?  Carroll Sage. Kenton Kingfisher, MSN, FNP-C [facility]

## 2016-05-25 NOTE — Telephone Encounter (Signed)
Holly from a home health agency is calling to see if one of our providers would sign off on orders for this pt. She was released from the hospital and they will be following her for medication management. Earnest Bailey can be reached at [phone]

## 2016-05-28 ENCOUNTER — Telehealth: Payer: Self-pay | Admitting: Surgery

## 2016-05-28 NOTE — Telephone Encounter (Signed)
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

## 2016-05-28 NOTE — Telephone Encounter (Signed)
Scheduled CTA's for 5/30 and follow up visit on 6/4 @ 1:45pm

## 2016-05-29 ENCOUNTER — Ambulatory Visit (INDEPENDENT_AMBULATORY_CARE_PROVIDER_SITE_OTHER): Payer: BLUE CROSS/BLUE SHIELD | Admitting: Physician Assistant

## 2016-05-29 VITALS — BP 122/72 | HR 107 | Temp 98.2°F | Resp 17 | Ht 64.0 in | Wt >= 6400 oz

## 2016-05-29 DIAGNOSIS — Z5181 Encounter for therapeutic drug level monitoring: Secondary | ICD-10-CM | POA: Diagnosis not present

## 2016-05-29 DIAGNOSIS — Z7901 Long term (current) use of anticoagulants: Secondary | ICD-10-CM | POA: Diagnosis not present

## 2016-05-29 DIAGNOSIS — Z09 Encounter for follow-up examination after completed treatment for conditions other than malignant neoplasm: Secondary | ICD-10-CM

## 2016-05-29 DIAGNOSIS — D72829 Elevated white blood cell count, unspecified: Secondary | ICD-10-CM

## 2016-05-29 NOTE — Patient Instructions (Addendum)
I will follow up with your lab result, and when we need to recheck you.   Please continue the coumadin as prescribed, and follow up with your appointments.    What You Need to Know About Warfarin Warfarin is a blood thinner (anticoagulant). Anticoagulants help to prevent the formation of blood clots. They also help to stop the growth of blood clots. Who should use warfarin? Warfarin is prescribed for people who are at risk for developing harmful blood clots, such as people who have:  Surgically implanted mechanical heart valves.  Irregular heart rhythms (atrial fibrillation).  Certain clotting disorders.  A history of harmful blood clotting in the past. This includes people who have had:  A stroke.  Blood clot in the lungs (pulmonary embolism, or PE).  Blood clot in the legs (deep vein thrombosis, or DVT).  An existing blood clot. How is warfarin taken?   Warfarin is a medicine that you take by mouth (orally). Warfarin tablets come in different strengths. Each tablet strength is a different color, with the amount of warfarin printed on the tablet. If you get a new prescription filled and the color of your tablet is different than usual, tell your pharmacist or health care provider immediately. What blood tests do I need while taking warfarin? The goal of warfarin therapy is to lessen the clotting tendency of blood, but not to prevent clotting completely. Your health care provider will monitor the anticoagulation effect of warfarin closely and will adjust your dose as needed. Warfarin is a medicine that needs to be closely monitored, so it is very important to keep all lab visits and follow-up visits with your health care provider. While taking warfarin, you will need to have blood tests (prothrombin tests, or PT tests) regularly to measure your blood clotting time. This type of test can be done with a finger stick or a blood draw. What does the INR test result mean?  The PT test results  will be reported as the International Normalized Ratio (INR). The INR tells your health care provider whether your dosage of warfarin needs to be changed. The longer it takes your blood to clot, the higher the INR. Your health care provider will tell you your target INR range. If your INR is not in your target range, your health care provider may adjust your dosage.  If your INR is above your target range, there is a risk of bleeding. Your dosage of warfarin may need to be decreased.  If your INR is below your target range, there is a risk of clotting. Your dosage of warfarin may need to be increased. How often is the INR test needed?   When you first start warfarin, you will usually have your INR checked every few days.  You may need to have INR tests done more than once a week until you are taking the correct dosage of warfarin.  After you have reached your target INR, your INR will be tested less often. However, you will need to have your INR checked at least once every 4-6 weeks for the entire time you are taking warfarin. What are the side effects of warfarin? Too much warfarin can cause bleeding (hemorrhage) in any part of the body, such as:  Bleeding from the gums.  Unexplained bruises.  Bruises that get larger.  Blood in the urine.  Bloody or dark stools.  Bleeding in the brain (hemorrhagic stroke).  A nosebleed that is not easily stopped.  Coughing up blood.  Vomiting  blood. Warfarin use may also cause:  Skin rash or irritations  Nausea that does not go away.  Severe pain in the back or joints.  Painful toes that turn blue or purple (purple toe syndrome).  Painful ulcers that do not go away (skin necrosis). What are the signs and symptoms of a blood clot? Too little warfarin can increase the risk of blood clots in your legs, lungs, or arms. Signs and symptoms of a DVT in your leg or arm may include:  Pain or swelling in your leg or arm.  Skin that is red or  warm to the touch on your arm or leg. Signs and symptoms of a pulmonary embolism may include:  Shortness of breath or difficulty breathing.  Chest pain.  Unexplained fever. What are the signs and symptoms of a stroke? If you are taking too much or too little warfarin, you can have a stroke. Signs and symptoms of a stroke may include:  Weakness or numbness of your face, arm, or leg, especially on one side of your body.  Confusion or trouble thinking clearly.  Difficulty seeing with one or both eyes.  Difficulty walking or moving your arms or legs.  Dizziness.  Loss of balance or coordination.  Trouble speaking, trouble understanding speech, or both (aphasia).  Sudden, severe headache with no known cause.  Partial or total loss of consciousness. What precautions do I need to take while using warfarin?   Take warfarin exactly as told by your health care provider. Doing this helps you avoid bleeding or blood clots that could result in serious injury, pain, or disability.  Take your medicine at the same time every day. If you forget to take your dose of warfarin, take it as soon as you remember that day. If you do not remember on that day, do not take an extra dose the next day.  Contact your health care provider if you miss or take an extra dose. Do not change your dosage on your own to make up for missed or extra doses.  Wear or carry identification that says that you are taking warfarin.  Make sure that all health care providers, including your dentist, know you are taking warfarin.  If you need surgery, talk with your health care provider about whether you should stop taking warfarin before your surgery.  Avoid situations that cause bleeding. You may bleed more easily while taking warfarin. To limit bleeding, take the following actions:  Use a softer toothbrush.  Floss with waxed floss, not unwaxed floss.  Shave with an electric razor, not with a blade.  Limit your  use of sharp objects.  Avoid potentially harmful activities, such as contact sports. What do I need to know about warfarin and pregnancy or breastfeeding?  Warfarin is not recommended during the first trimester of pregnancy due to an increased risk of birth defects. In certain situations, a woman may take warfarin after her first trimester of pregnancy.  If you are taking warfarin and you become pregnant or plan to become pregnant, contact your health care provider right away.  If you plan to breastfeed while taking warfarin, talk with your health care provider first. What do I need to know about warfarin and alcohol or drug use?  Avoid drinking alcohol, or limit alcohol intake to no more than 1 drink a day for nonpregnant women and 2 drinks a day for men. One drink equals 12 oz of beer, 5 oz of wine, or 1 oz of hard  liquor.  If you change the amount of alcohol that you drink, tell your health care provider. Your warfarin dosage may need to be changed.    Avoid street drugs while taking warfarin. The effects of street drugs on warfarin are not known. What do I need to know about warfarin and other medicines or supplements?  Many prescription and over-the-counter medicines can interfere with warfarin. Talk with your health care provider or your pharmacist before starting or stopping any new medicines. This includes over-the-counter vitamins, dietary supplements, herbal medicines, and pain medicines. Your warfarin dosage may need to be adjusted.  Some common over-the-counter medicines that may increase the risk of bleeding while taking warfarin include:  Acetaminophen.    NSAIDs, such as ibuprofen or naproxen.  Vitamin E. What do I need to know about warfarin and my diet?  It is important to maintain a normal, balanced diet while taking warfarin. Avoid major changes in your diet. If you are going to change your diet, talk with your health care provider before making changes.  Your  health care provider may recommend that you work with a diet and nutrition specialist (dietitian).  Vitamin K decreases the effect of warfarin, and it is found in many foods. Eat a consistent amount of foods that contain vitamin K. For example, you may decide to eat 2 vitamin K-containing foods each day. Most foods that are high in vitamin K are green and leafy. Common foods that contain high amounts of vitamin K include:  Kale, raw or cooked.  Spinach, raw or cooked.  Collards, raw or cooked.  Swiss chard, raw or cooked.  Mustard greens, raw or cooked.  Turnip greens, raw or cooked.  Parsley, raw.  Broccoli, cooked.  Noodles, eggs, and spinach, enriched.  Brussels sprouts, raw or cooked.  Beet greens, raw or cooked.  Endive, raw.  Cabbage, cooked.  Asparagus, cooked. Foods that contain moderate amounts of vitamin K include:  Broccoli, raw.  Cabbage, raw.  Bok choy, cooked.  Green leaf lettuce, raw  Prunes, stewed.  Angie Fava.  Kiwi.  Edamame, cooked.  Romaine lettuce, raw.  Avocado.  Tuna, canned in oil.  Okra, cooked.  Black-eyed peas, cooked.  Green beans, cooked or raw.  Blueberries, raw.  Blackberries, raw.  Peas, cooked or raw. Contact a health care provider if:  You miss a dose.  You take an extra dose.  You plan to have any kind of surgery or procedure.  You are unable to take your medicine due to nausea, vomiting, or diarrhea.  You have any major changes in your diet or you plan to make any major changes in your diet.  You start or stop any over-the-counter medicine, prescription medicine, or dietary supplement.  You become pregnant, plan to become pregnant, or think you may be pregnant.  You have menstrual periods that are heavier than usual.  You have unusual bruising. Get help right away if:  You develop symptoms of an allergic reaction, such as:  Swelling of the lips, face, tongue, mouth, or  throat.  Rash.  Itching.  Itchy, red, swollen areas of skin (hives).  Trouble breathing.  Chest tightness.  You have:  Signs or symptoms of a stroke.  Signs or symptoms of a blood clot.  A fall or have an accident, especially if you hit your head.  Blood in your urine. Your urine may look reddish, pinkish, or tea-colored.  Blood in your stool. Your stool may be black or bright red.  Bleeding that  does not stop after applying pressure to the area for 30 minutes.  Severe pain in your joints or back.  Purple or blue toes.  Skin ulcers that do not go away.  You vomit blood or cough up blood. The blood may be bright red, or it may look like coffee grounds. These symptoms may represent a serious problem that is an emergency. Do not wait to see if the symptoms will go away. Get medical help right away. Call your local emergency services (911 in the [place] Do not drive yourself to the hospital. Summary  Warfarin needs to be closely monitored with blood tests. It is very important to keep all lab visits and follow-up visits with your health care provider.  Make sure that you know your target INR range and your warfarin dosage.  Wear or carry identification that says that you are taking warfarin.  Take warfarin at the same time every day. Call your health care provider if you miss a dose or if you take an extra dose. Do not change the dosage of warfarin on your own.  Know the signs and symptoms of blood clots, bleeding, and a stroke. Know when to get emergency medical help.  Tell all health care providers who care for you that you are taking warfarin.  Talk with your health care provider or your pharmacist before starting or stopping any new medicines.  Monitor how much vitamin K you eat every day. Try to eat the same amount every day. This information is not intended to replace advice given to you by your health care provider. Make sure you discuss any questions you have  with your health care provider. Document Released: 03/12/2005 Document Revised: 11/22/2015 Document Reviewed: 06/08/2015 Elsevier Interactive Patient Education  2017 [employer].    IF you received an x-ray today, you will receive an invoice from [facility]. Please contact [facility] at [phone] with questions or concerns regarding your invoice.   IF you received labwork today, you will receive an invoice from [facility]. Please contact [facility] at [phone] with questions or concerns regarding your invoice.   Our billing staff will not be able to assist you with questions regarding bills from these companies.  You will be contacted with the lab results as soon as they are available. The fastest way to get your results is to activate your My Chart account. Instructions are located on the last page of this paperwork. If you have not heard from [place] regarding the results in 2 weeks, please contact this office.

## 2016-05-29 NOTE — Progress Notes (Signed)
[facility] [address], [place] [address] [phone]  Date:  05/29/2016   Name:  Alexa [name]   DOB:  26-Jul-1975   MRN:  [id number]  PCP:  Pcp Not In System   Chief Complaint  Patient presents with  . blood clots     History of Present Illness:  Alexa [name] is a 41 y.o. female patient who presents to [facility] for hospital follow up. Patient was hospitalized for sob.  CTA revealed...  Patient denies any fever, coughing, sob.  She is currently not using her oxygen at this time, but may use this around the house.  She has swelling around the ankles which subsided ta the hospital, but now that she is back back at home and ambulating, this swelling has returned.  No chest pains.  No coughing at this time. Positive for acute PE with CT evidence of right heart strain (RV/LV Ratio = 1.26) consistent with at least submassive (intermediate risk) PE. The presence of right heart strain has been associated with an increased risk of morbidity and mortality. Given the main pulmonary artery enlargement, there may be a component of underlying chronic pulmonary arterial hypertension contributing to the right ventricular enlargement.  Marland Kitchen.Called patient. Advised patient of provider's approval for requested procedure, as well as any comments/instructions from provider.   Provided patient w/ verbal instructions concerning pre-, intra- and post-procedure preparation and instructions.  Patient verbalized understanding of the above.   ...patient placed on heparin to lovenox inpatient, and transitioned to coumadin 10mg .  Following up here in 1 week, today.  Patient will follow up with pulmonologist, and has CTA scheduled in 3 months for repeat of abdomen and pelvis.  Patient Active Problem List   Diagnosis Date Noted  . Acute bronchitis 05/19/2016  . Acute pulmonary embolus ([place]) 05/14/2016  . Acute respiratory failure with hypoxia ([place]) 05/14/2016  . Hypoxia   .  Class 3 obesity due to excess calories with serious comorbidity and body mass index (BMI) greater than or equal to 70 in adult ([place]) 04/15/2016  . Athlete's foot 10/08/2015  . Diabetes ([place]) 06/22/2015  . Benign essential HTN 06/22/2015  . 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 BMI 70 and over, adult ([place]) 06/22/2015  . Vitamin D deficiency 06/22/2015  . Iron deficiency anemia 06/22/2015    Past Medical History:  Diagnosis Date  . Gestational diabetes   . Hypertension     Past Surgical History:  Procedure Laterality Date  . CESAREAN SECTION      Social History  Substance Use Topics  . Smoking status: Current Some Day Smoker    Types: Cigarettes  . Smokeless tobacco: Never Used     Comment: only smokes when she drinks alcohol - 1 pack per week  . Alcohol use 2.4 - 3.6 oz/week    4 - 6 Standard drinks or equivalent per week     Comment: 13-4 times/weeks    Family History  Problem Relation Age of Onset  . Diabetes Maternal Grandmother   . Hypertension Mother     Allergies  Allergen Reactions  . Penicillins Hives    Has patient had a PCN reaction causing immediate rash, facial/tongue/throat swelling, SOB or lightheadedness with hypotension: No Has patient had a PCN reaction causing severe rash involving mucus membranes or skin necrosis: No Has patient had a PCN reaction that required hospitalization No Has patient had a PCN reaction occurring within the last 10 years: No  If all of the above answers are "NO", then may proceed with Cephalosporin use.    Medication list has been reviewed and updated.  Current Outpatient Prescriptions on File Prior to Visit  Medication Sig Dispense Refill  . ferrous sulfate 325 (65 FE) MG tablet Take 325 mg by mouth daily with breakfast.    . furosemide (LASIX) 40 MG tablet Take 1 tablet (40 mg total) by mouth daily. 30 tablet 3  . guaiFENesin (MUCINEX) 600 MG 12 hr tablet Take 2 tablets (1,200 mg total) by mouth 2 (two) times  daily. 10 tablet 0  . losartan (COZAAR) 100 MG tablet Take 1 tablet (100 mg total) by mouth daily. 30 tablet 2  . metFORMIN (GLUCOPHAGE) 500 MG tablet Take 1 tablet (500 mg total) by mouth 2 (two) times daily with a meal. 180 tablet 3  . nystatin (MYCOSTATIN/NYSTOP) powder Apply topically 4 (four) times daily. 60 g 1  . warfarin (COUMADIN) 10 MG tablet Take 1 tablet (10 mg total) by mouth daily. 30 tablet 2  . ipratropium-albuterol (DUONEB) 0.5-2.5 (3) MG/3ML SOLN Take 3 mLs by nebulization every 6 (six) hours as needed. (Patient not taking: Reported on 05/29/2016) 360 mL 0   No current facility-administered medications on file prior to visit.     ROS ROS otherwise unremarkable unless listed above.  Physical Examination: BP 122/72 (BP Location: Right Arm, Patient Position: Sitting, Cuff Size: Large)   Pulse (!) 107   Temp 98.2 F (36.8 C) (Oral)   Resp 17   Ht 5\' 4"  (1.626 m)   Wt (!) 410 lb (186 kg)   LMP 05/18/2016 Comment: negative urine pregnancy test 05-15-2016  SpO2 97%   BMI 70.38 kg/m  Ideal Body Weight: Weight in (lb) to have BMI = 25: 145.3  Physical Exam  Constitutional: She is oriented to person, place, and time. She appears well-developed and well-nourished. No distress.  HENT:  Head: Normocephalic and atraumatic.  Right Ear: External ear normal.  Left Ear: External ear normal.  Eyes: Conjunctivae and EOM are normal. Pupils are equal, round, and reactive to light.  Cardiovascular: Regular rhythm and normal heart sounds.  Tachycardia present.  Exam reveals no gallop and no friction rub.   No murmur heard. Pulses:      Carotid pulses are 2+ on the right side, and 2+ on the left side.      Radial pulses are 2+ on the right side, and 2+ on the left side.       Dorsalis pedis pulses are 2+ on the right side, and 2+ on the left side.  Bilateral edema   Pulmonary/Chest: Effort normal. No apnea. No respiratory distress. She has no decreased breath sounds. She has no  wheezes.  Neurological: She is alert and oriented to person, place, and time.  Skin: She is not diaphoretic.  Psychiatric: She has a normal mood and affect. Her behavior is normal.   CBC Latest Ref Rng & Units 05/29/2016 05/22/2016 05/21/2016  WBC 3.4 - 10.8 x10E3/uL 6.6 14.6(H) 11.8(H)  Hemoglobin 12.0 - 15.0 g/dL - 12.0 12.2  Hematocrit 34.0 - 46.6 % 43.7 41.0 42.3  Platelets 150 - 379 x10E3/uL 323 487(H) 454(H)    Assessment and Plan: Alexa [name] is a 41 y.o. female who is here today for hospitalization follow up. ptinr obtained today.  Advised that she should remain on oxygen at this time. Repeat cbc at this time. Anticoagulated on Coumadin - Plan: Protime-INR, CBC  Leukocytosis, unspecified type -  Plan: Protime-INR, CBC  Follow up - Plan: Protime-INR, CBC  Ivar Drape, PA-C [facility] 3/18/20188:29 PM

## 2016-05-30 ENCOUNTER — Telehealth: Payer: Self-pay

## 2016-05-30 LAB — CBC
Hematocrit: 43.7 % (ref 34.0–46.6)
Hemoglobin: 13.3 g/dL (ref 11.1–15.9)
MCH: 24.7 pg — ABNORMAL LOW (ref 26.6–33.0)
MCHC: 30.4 g/dL — ABNORMAL LOW (ref 31.5–35.7)
MCV: 81 fL (ref 79–97)
Platelets: 323 10*3/uL (ref 150–379)
RBC: 5.39 x10E6/uL — ABNORMAL HIGH (ref 3.77–5.28)
RDW: 17.8 % — ABNORMAL HIGH (ref 12.3–15.4)
WBC: 6.6 10*3/uL (ref 3.4–10.8)

## 2016-05-30 LAB — PROTIME-INR
INR: 1.5 — ABNORMAL HIGH (ref 0.8–1.2)
Prothrombin Time: 15.6 s — ABNORMAL HIGH (ref 9.1–12.0)

## 2016-05-30 NOTE — Telephone Encounter (Signed)
Spoke with pt. She is needing to be scheduled for a HFU. Pt has been scheduled with TP as she saw AD in the hospital. Nothing further was needed.

## 2016-06-01 ENCOUNTER — Telehealth: Payer: Self-pay

## 2016-06-01 NOTE — Telephone Encounter (Signed)
Pt is calliing to talk with someone about her returning to work and her note for this   Best number [phone]

## 2016-06-02 NOTE — Telephone Encounter (Signed)
Pt advised to return to clinic for lab only next Monday for PT/INR She will need to follow up with HR department regarding FMLA info/return to work Referral placed to Coumadin clinic  Colletta [place] spoke with patient

## 2016-06-04 ENCOUNTER — Ambulatory Visit (INDEPENDENT_AMBULATORY_CARE_PROVIDER_SITE_OTHER): Payer: BLUE CROSS/BLUE SHIELD

## 2016-06-04 ENCOUNTER — Ambulatory Visit (INDEPENDENT_AMBULATORY_CARE_PROVIDER_SITE_OTHER): Payer: BLUE CROSS/BLUE SHIELD | Admitting: Physician Assistant

## 2016-06-04 VITALS — BP 138/84 | HR 112 | Temp 99.0°F | Resp 20 | Ht 64.0 in | Wt >= 6400 oz

## 2016-06-04 DIAGNOSIS — M25572 Pain in left ankle and joints of left foot: Secondary | ICD-10-CM | POA: Diagnosis not present

## 2016-06-04 DIAGNOSIS — I2699 Other pulmonary embolism without acute cor pulmonale: Secondary | ICD-10-CM

## 2016-06-04 DIAGNOSIS — Z5181 Encounter for therapeutic drug level monitoring: Secondary | ICD-10-CM | POA: Diagnosis not present

## 2016-06-04 DIAGNOSIS — Z7901 Long term (current) use of anticoagulants: Secondary | ICD-10-CM | POA: Diagnosis not present

## 2016-06-04 LAB — PROTIME-INR
INR: 1.5 — ABNORMAL HIGH (ref 0.8–1.2)
Prothrombin Time: 15.3 s — ABNORMAL HIGH (ref 9.1–12.0)

## 2016-06-04 NOTE — Progress Notes (Signed)
PRIMARY CARE AT [facility] [address], [place] [address] [phone]  Date:  06/04/2016   Name:  Alexa [name]   DOB:  10/29/1975   MRN:  [id number]  PCP:  Pcp Not In System    History of Present Illness:  Alexa [name] is a 41 y.o. female patient who presents to PCP with  Chief Complaint  Patient presents with  . Foot Pain    left ankle x 5 days   . Labs Only   Patient is here today for follow up of his coumadin.   Patient reports that she has no sob or trouble breathing. She is not currently using the oxygen at home.  No chest pains or coughing. Patient has developed left foot pain along the top of her foot.  No new noticeable swelling.  Reports no trauma.   Coumadin managed for 13 days, and she reports daily compliance of medications.  No drastic change of diet.      INR/Prothrombin time: 1.5 Ambulating 89-90% O2 sat.  Patient Active Problem List   Diagnosis Date Noted  . Acute bronchitis 05/19/2016  . Acute pulmonary embolus ([place]) 05/14/2016  . Acute respiratory failure with hypoxia ([place]) 05/14/2016  . Hypoxia   . Class 3 obesity due to excess calories with serious comorbidity and body mass index (BMI) greater than or equal to 70 in adult ([place]) 04/15/2016  . Athlete's foot 10/08/2015  . Diabetes ([place]) 06/22/2015  . Benign essential HTN 06/22/2015  . 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 BMI 70 and over, adult ([place]) 06/22/2015  . Vitamin D deficiency 06/22/2015  . Iron deficiency anemia 06/22/2015    Past Medical History:  Diagnosis Date  . Gestational diabetes   . Hypertension     Past Surgical History:  Procedure Laterality Date  . CESAREAN SECTION      Social History  Substance Use Topics  . Smoking status: Former Smoker    Types: Cigarettes  . Smokeless tobacco: Never Used     Comment: only smokes when she drinks alcohol - 1 pack per week  . Alcohol use 2.4 - 3.6 oz/week    4 - 6 Standard drinks or equivalent per week   Comment: 13-4 times/weeks    Family History  Problem Relation Age of Onset  . Diabetes Maternal Grandmother   . Hypertension Mother     Allergies  Allergen Reactions  . Penicillins Hives    Has patient had a PCN reaction causing immediate rash, facial/tongue/throat swelling, SOB or lightheadedness with hypotension: No Has patient had a PCN reaction causing severe rash involving mucus membranes or skin necrosis: No Has patient had a PCN reaction that required hospitalization No Has patient had a PCN reaction occurring within the last 10 years: No If all of the above answers are "NO", then may proceed with Cephalosporin use.    Medication list has been reviewed and updated.  Current Outpatient Prescriptions on File Prior to Visit  Medication Sig Dispense Refill  . ferrous sulfate 325 (65 FE) MG tablet Take 325 mg by mouth daily with breakfast.    . furosemide (LASIX) 40 MG tablet Take 1 tablet (40 mg total) by mouth daily. 30 tablet 3  . losartan (COZAAR) 100 MG tablet Take 1 tablet (100 mg total) by mouth daily. 30 tablet 2  . metFORMIN (GLUCOPHAGE) 500 MG tablet Take 1 tablet (500 mg total) by mouth 2 (two) times daily with a meal. 180 tablet 3  .  nystatin (MYCOSTATIN/NYSTOP) powder Apply topically 4 (four) times daily. 60 g 1  . warfarin (COUMADIN) 10 MG tablet Take 1 tablet (10 mg total) by mouth daily. 30 tablet 2  . guaiFENesin (MUCINEX) 600 MG 12 hr tablet Take 2 tablets (1,200 mg total) by mouth 2 (two) times daily. (Patient not taking: Reported on 06/04/2016) 10 tablet 0  . ipratropium-albuterol (DUONEB) 0.5-2.5 (3) MG/3ML SOLN Take 3 mLs by nebulization every 6 (six) hours as needed. (Patient not taking: Reported on 05/29/2016) 360 mL 0   No current facility-administered medications on file prior to visit.     ROS ROS otherwise unremarkable unless listed above.  Physical Examination: BP 138/84 (BP Location: Right Arm, Patient Position: Sitting, Cuff Size: Large)   Pulse  (!) 123   Temp 99 F (37.2 C) (Oral)   Resp 20   Ht 5\' 4"  (1.626 m)   Wt (!) 400 lb 3.2 oz (181.5 kg)   LMP 05/18/2016 Comment: negative urine pregnancy test 05-15-2016  SpO2 91%   BMI 68.69 kg/m  Ideal Body Weight: Weight in (lb) to have BMI = 25: 145.3  Physical Exam  Constitutional: She is oriented to person, place, and time. She appears well-developed and well-nourished. No distress.  HENT:  Head: Normocephalic and atraumatic.  Right Ear: External ear normal.  Left Ear: External ear normal.  Eyes: Conjunctivae and EOM are normal. Pupils are equal, round, and reactive to light.  Cardiovascular: Normal rate.   Pulses:      Carotid pulses are 2+ on the right side, and 2+ on the left side.      Dorsalis pedis pulses are 1+ on the right side, and 1+ on the left side.  Pulmonary/Chest: Effort normal. No respiratory distress.  Musculoskeletal:       Left ankle: She exhibits no ecchymosis. No lateral malleolus and no medial malleolus tenderness found.  Tender along the talus.  Pain incited with rom active and passive.    Neurological: She is alert and oriented to person, place, and time.  Skin: She is not diaphoretic.  Psychiatric: She has a normal mood and affect. Her behavior is normal.    Assessment and Plan: Alexa [name] is a 41 y.o. female who is here today for cc of foot pain and coumadin management. Rechecking pt inr--will have ot increase if this is not reaching therapeutic dosing.  Contact pending result. Referral to coumadin clinic given and appreciated at this time, for consult. Foot possibly strained.  Given bandage.  Advised ice.  Radiograph dx reviewed with colleague.  Anticoagulated on Coumadin - Plan: Ambulatory referral to Anticoagulation Monitoring, Protime-INR, CANCELED: Protime-INR  Acute left ankle pain - Plan: DG Ankle Complete Left  Other acute pulmonary embolism without acute cor pulmonale (HCC) - Plan: Ambulatory referral to Anticoagulation  Monitoring, Protime-INR  Ivar Drape, PA-C [facility] 3/12/20184:16 PM

## 2016-06-04 NOTE — Patient Instructions (Signed)
     IF you received an x-ray today, you will receive an invoice from [facility]. Please contact [facility] at [phone] with questions or concerns regarding your invoice.   IF you received labwork today, you will receive an invoice from [facility]. Please contact [facility] at [phone] with questions or concerns regarding your invoice.   Our billing staff will not be able to assist you with questions regarding bills from these companies.  You will be contacted with the lab results as soon as they are available. The fastest way to get your results is to activate your My Chart account. Instructions are located on the last page of this paperwork. If you have not heard from us regarding the results in 2 weeks, please contact this office.     

## 2016-06-06 MED ORDER — WARFARIN SODIUM 6 MG PO TABS: 12.0000 mg | ORAL_TABLET | Freq: Every day | ORAL | 1 refills | Status: DC

## 2016-06-08 ENCOUNTER — Other Ambulatory Visit (INDEPENDENT_AMBULATORY_CARE_PROVIDER_SITE_OTHER): Payer: BLUE CROSS/BLUE SHIELD

## 2016-06-08 ENCOUNTER — Telehealth: Payer: Self-pay | Admitting: Family Medicine

## 2016-06-08 ENCOUNTER — Ambulatory Visit (INDEPENDENT_AMBULATORY_CARE_PROVIDER_SITE_OTHER): Payer: BLUE CROSS/BLUE SHIELD | Admitting: Adult Health

## 2016-06-08 ENCOUNTER — Encounter: Payer: Self-pay | Admitting: Adult Health

## 2016-06-08 VITALS — BP 128/84 | HR 103 | Ht 64.0 in | Wt 399.2 lb

## 2016-06-08 DIAGNOSIS — Z7901 Long term (current) use of anticoagulants: Secondary | ICD-10-CM

## 2016-06-08 DIAGNOSIS — Z5181 Encounter for therapeutic drug level monitoring: Secondary | ICD-10-CM | POA: Diagnosis not present

## 2016-06-08 DIAGNOSIS — I2699 Other pulmonary embolism without acute cor pulmonale: Secondary | ICD-10-CM

## 2016-06-08 LAB — PROTIME-INR
INR: 1.9 ratio — ABNORMAL HIGH (ref 0.8–1.0)
PROTHROMBIN TIME: 20 s — AB (ref 9.6–13.1)

## 2016-06-08 NOTE — Assessment & Plan Note (Signed)
Subtherapeutic last 2 INR checks , coumadin adjusted by urgent care  Cont current dose , check INR today

## 2016-06-08 NOTE — Assessment & Plan Note (Addendum)
Unprovoked PE w/ risk factors of obesity /chronic leg swelling (essentially second unprovoked clot w/ supercial thrombus in left saphenous in 2017 (tx x 3 month )  Continue on coumadin w/ goal INR 2-3.  If lifelong therapy is decided on , given her age would consider hematology referral.  Right sided dilation /elevated PAP -recheck echo in 3 months   Plan  Patient Instructions  2 D Echo in 3 months  Continue on Coumadin as directed .  Keep coumadin checks as planned.  Continue on oxygen 2l/m with activity and At bedtime  .  Do not take any NSAIDS -advil, aleve, ibuprofen, etc.  Follow up with Dr. Ashok Cordia in 2 months and As needed   INR check today .  Please contact office for sooner follow up if symptoms do not improve or worsen or seek emergency care

## 2016-06-08 NOTE — Progress Notes (Signed)
LMOMTCB x 1 

## 2016-06-08 NOTE — Progress Notes (Signed)
@Patient  ID: Alexa [name], female    DOB: December 29, 1975, 42 y.o.   MRN: [id number]  Chief Complaint  Patient presents with  . Follow-up    PE    Referring provider: No ref. provider found  HPI: 41 year old female seen for hospital consult for unprovoked pulmonary embolism (Risk factors -obesity/prev superficial thrombus in 2017)  TEST  CTa CHEST 04/2015> showed pulmonary emboli in the distal right pulmonary artery and segmental emboli in the right upper lobe and right lower lobe segmental arteries. In addition, thrombus was noted in the patient's descending thoracic aorta  . Echo with bubble study showed no PFO., EF 95-62%, grade 2 diastolic dysfunction, moderate right ventricle dilation, pulmonary artery pressure 56 mmHg Venous doppler neg.   06/08/2016 post hospital follow-up Patient presents for a follow-up for recent hospitalization. Patient was seen for pulmonary consult, he was admitted February 19 with shortness of breath. CT was positive for PE. This was felt to be unprovoked. She did have risk factors with morbid obesity and prior history of superficial thrombus. Patient had previously been treated for superficial thrombosis of the left saphenous vein in 2017 treated with 3 months of Xarelto. Started on heparin and transitioned over to Coumadin/Lovenox bridge. Since discharge. Patient is feeling  Getting coumadin levels/INR checked at [facility] urgent care. 3/12/ INR 1.5 . Coumadin was increased to Coumadin 12mg  daily.  Has follow up next week for INR.  Denies bleeding . Dyspnea is less.  Discharged on Oxygen 2l/m activity and At bedtime  .   No recent surgeries, trauma, long /extensive travel, no BCP or HRT.  No hx of cancer.    Allergies  Allergen Reactions  . Penicillins Hives    Has patient had a PCN reaction causing immediate rash, facial/tongue/throat swelling, SOB or lightheadedness with hypotension: No Has patient had a PCN reaction causing severe rash involving  mucus membranes or skin necrosis: No Has patient had a PCN reaction that required hospitalization No Has patient had a PCN reaction occurring within the last 10 years: No If all of the above answers are "NO", then may proceed with Cephalosporin use.    Immunization History  Administered Date(s) Administered  . Influenza,inj,Quad PF,36+ Mos 03/24/2016  . Influenza-Unspecified 01/04/2015    Past Medical History:  Diagnosis Date  . Gestational diabetes   . Hypertension     Tobacco History: History  Smoking Status  . Former Smoker  . Types: Cigarettes  Smokeless Tobacco  . Never Used    Comment: only smokes when she drinks alcohol - 1 pack per week   Counseling given: Not Answered   Outpatient Encounter Prescriptions as of 06/08/2016  Medication Sig  . ferrous sulfate 325 (65 FE) MG tablet Take 325 mg by mouth daily with breakfast.  . furosemide (LASIX) 40 MG tablet Take 1 tablet (40 mg total) by mouth daily.  Marland Kitchen losartan (COZAAR) 100 MG tablet Take 1 tablet (100 mg total) by mouth daily.  . metFORMIN (GLUCOPHAGE) 500 MG tablet Take 1 tablet (500 mg total) by mouth 2 (two) times daily with a meal.  . nystatin (MYCOSTATIN/NYSTOP) powder Apply topically 4 (four) times daily.  Marland Kitchen warfarin (COUMADIN) 6 MG tablet Take 2 tablets (12 mg total) by mouth daily.  . [DISCONTINUED] guaiFENesin (MUCINEX) 600 MG 12 hr tablet Take 2 tablets (1,200 mg total) by mouth 2 (two) times daily. (Patient not taking: Reported on 06/04/2016)  . [DISCONTINUED] ipratropium-albuterol (DUONEB) 0.5-2.5 (3) MG/3ML SOLN Take 3 mLs by nebulization every 6 (  six) hours as needed. (Patient not taking: Reported on 05/29/2016)   No facility-administered encounter medications on file as of 06/08/2016.      Review of Systems  Constitutional:   No  weight loss, night sweats,  Fevers, chills, fatigue, or  lassitude.  HEENT:   No headaches,  Difficulty swallowing,  Tooth/dental problems, or  Sore throat,                 No sneezing, itching, ear ache, nasal congestion, post nasal drip,   CV:  No chest pain,  Orthopnea, PND,   anasarca, dizziness, palpitations, syncope.   GI  No heartburn, indigestion, abdominal pain, nausea, vomiting, diarrhea, change in bowel habits, loss of appetite, bloody stools.   Resp:    No excess mucus, no productive cough,  No non-productive cough,  No coughing up of blood.  No change in color of mucus.  No wheezing.  No chest wall deformity  Skin: no rash or lesions.  GU: no dysuria, change in color of urine, no urgency or frequency.  No flank pain, no hematuria   MS:  No joint pain or swelling.  No decreased range of motion.  No back pain.    Physical Exam  BP 128/84   Pulse (!) 103   Ht 5\' 4"  (1.626 m)   Wt (!) 399 lb 3.2 oz (181.1 kg)   LMP 05/18/2016 Comment: negative urine pregnancy test 05-15-2016  SpO2 94%   BMI 68.52 kg/m   GEN: A/Ox3; pleasant , NAD, obese    HEENT:  [place]/AT,  EACs-clear, TMs-wnl, NOSE-clear, THROAT-clear, no lesions, no postnasal drip or exudate noted. Class 2-3 MP airway   NECK:  Supple w/ fair ROM; no JVD; normal carotid impulses w/o bruits; no thyromegaly or nodules palpated; no lymphadenopathy.    RESP  Clear  P & A; w/o, wheezes/ rales/ or rhonchi. no accessory muscle use, no dullness to percussion  CARD:  RRR, no m/r/g, no peripheral edema, pulses intact, no cyanosis or clubbing.  GI:   Soft & nt; nml bowel sounds; no organomegaly or masses detected.   Musco: Warm bil, no deformities or joint swelling noted.   Neuro: alert, no focal deficits noted.    Skin: Warm, no lesions or rashes    Lab Results:   BNP Imaging: Dg Chest 2 View  Result Date: 05/18/2016 CLINICAL DATA:  41 y/o  F; shortness of breath and weakness. EXAM: CHEST  2 VIEW COMPARISON:  05/16/2016 chest radiograph. FINDINGS: Stable cardiomegaly. Stable pulmonary vascular congestion. No focal consolidation. No pleural effusion or pneumothorax. Mild degenerative  changes of the spine. IMPRESSION: Stable cardiomegaly and pulmonary vascular congestion. No focal consolidation or effusion. Electronically Signed   By: Kristine Garbe M.D.   On: 05/18/2016 16:30   Dg Chest 2 View  Result Date: 05/16/2016 CLINICAL DATA:  Pulmonary edema.  Shortness of breath. EXAM: CHEST  2 VIEW COMPARISON:  Chest radiograph and CTA 05/14/2016 FINDINGS: The cardiomediastinal silhouette is unchanged. Pulmonary vascular congestion is slightly improved. The interstitial markings remain mildly increased diffusely. There is no evidence of new confluent airspace opacity, sizeable pleural effusion, or pneumothorax. IMPRESSION: Minimally improved pulmonary vascular congestion. Electronically Signed   By: Logan Bores M.D.   On: 05/16/2016 09:03   Dg Ankle Complete Left  Result Date: 06/04/2016 CLINICAL DATA:  Left ankle pain and tenderness in the region of the talus. No known injury. EXAM: LEFT ANKLE COMPLETE - 3+ VIEW COMPARISON:  Left foot dated 05/31/2015. FINDINGS:  Diffuse soft tissue swelling is again demonstrated. Mild inferior calcaneal spur formation. Mild dorsal tarsal spur formation. No effusion. No fracture, dislocation or bone destruction. No soft tissue gas. IMPRESSION: Diffuse soft tissue swelling without acute underlying bony abnormality. Mild degenerative changes. Electronically Signed   By: Claudie Revering M.D.   On: 06/04/2016 09:32   Ct Angio Chest Pe W Or Wo Contrast  Result Date: 05/14/2016 CLINICAL DATA:  Shortness of breath. Bilateral leg swelling. On Xarelto. EXAM: CT ANGIOGRAPHY CHEST WITH CONTRAST TECHNIQUE: Multidetector CT imaging of the chest was performed using the standard protocol during bolus administration of intravenous contrast. Multiplanar CT image reconstructions and MIPs were obtained to evaluate the vascular anatomy. CONTRAST:  100 mL Isovue 370 COMPARISON:  Chest radiograph 05/14/2016 FINDINGS: Cardiovascular: There is nonocclusive embolus in the  distal right main pulmonary artery extending into the right upper lobe, interlobar, and right lower lobe arteries. There is also evidence of segmental and subsegmental emboli in the right upper and lower lobes, although evaluation is limited by patient body habitus and motion artifact. No central left-sided emboli are identified. Evaluation of left lower lobe segmental and subsegmental vessels is limited by motion. There is flattening of the interventricular septum. The right ventricle to left ventricle ratio is abnormally elevated at 1.26. The main pulmonary artery is enlarged, measuring 4.2 cm in diameter. The heart is mildly enlarged. No pericardial effusion. The thoracic aorta is normal in caliber, however there is prominent thrombus within the predominantly distal descending thoracic aorta extending into the proximal abdominal aorta, incompletely visualized. Mediastinum/Nodes: No enlarged axillary, mediastinal, or hilar lymph nodes. Lungs/Pleura: No pleural effusion or pneumothorax. Evaluation of the lungs is limited by motion artifact. There is a small amount of patchy opacity in the basilar lower lobes bilaterally, greater on the left. Upper Abdomen: Partially visualized aortic thrombus as described above. Musculoskeletal: No acute osseous abnormality or suspicious osseous lesion. Review of the MIP images confirms the above findings. IMPRESSION: 1. Positive for acute PE with CT evidence of right heart strain (RV/LV Ratio = 1.26) consistent with at least submassive (intermediate risk) PE. The presence of right heart strain has been associated with an increased risk of morbidity and mortality. Given the main pulmonary artery enlargement, there may be a component of underlying chronic pulmonary arterial hypertension contributing to the right ventricular enlargement. 2. Thrombus in the distal descending thoracic aorta and proximal abdominal aorta, incompletely visualized. CTA of the abdomen and pelvis is  recommended to further assess the extent of thrombus and patency of the visceral arteries. 3. Mild bibasilar lung opacities which may reflect atelectasis, infection, or early infarcts. Critical Value/emergent results were called by telephone at the time of interpretation on 05/14/2016 at 2:54 pm to Dr. Fredia Sorrow , who verbally acknowledged these results. Electronically Signed   By: Logan Bores M.D.   On: 05/14/2016 15:10   Dg Chest Port 1 View  Result Date: 05/14/2016 CLINICAL DATA:  Left leg pain. Shortness of breath for the past week. History of hypertension, diabetes, current smoker. EXAM: PORTABLE CHEST 1 VIEW COMPARISON:  Chest x-ray of December 30, 2015 FINDINGS: The lungs are well-expanded. The interstitial markings are increased. The pulmonary vascularity is engorged. The cardiac silhouette is mildly enlarged. There is no pleural effusion. There is no alveolar infiltrate. The mediastinum is normal in width. IMPRESSION: Findings consistent with miles pulmonary edema likely of cardiac cause. No alveolar pneumonia. When the patient can tolerate the procedure, a PA and lateral chest x-ray would be  useful. Electronically Signed   By: David  Martinique M.D.   On: 05/14/2016 13:06   Ct Angio Abd/pel W/ And/or W/o  Result Date: 05/15/2016 CLINICAL DATA:  5 day history of shortness of breath that began on 05/10/2015. The patient was initially hypoxemic with oxygen saturation of 78% on room air. She was stabilized on 3 L with oxygen saturation 98%. Chest x-ray showed increased interstitial markings without distinct infiltrate. CT angiogram of the chest was obtained and showed pulmonary emboli in the distal right pulmonary artery and segmental emboli in the right upper lobe and right lower lobe segmental arteries. In addition, thrombus was noted in the patient's descending thoracic aorta. Thrombus in the distal descending thoracic aorta and proximalabdominal aorta, incompletely visualized. The patient was  started on heparin drip. The patient remained hemodynamically stable with heart rate in the upper 90s. BMP showed potassium 3.1. WBC was 15.0. EKG shows sinus tachycardia with nonspecific T-wave changes and right axis deviation. PCCM was consulted because there was evidence of possible right heart strain EXAM: CTA ABDOMEN AND PELVIS WITH CONTRAST TECHNIQUE: Multidetector CT imaging of the abdomen and pelvis was performed using the standard protocol during bolus administration of intravenous contrast. Multiplanar reconstructed images and MIPs were obtained and reviewed to evaluate the vascular anatomy. CONTRAST:  100 mL Isovue 370 IV COMPARISON:  CT chest from earlier the same day FINDINGS: VASCULAR Aorta: Intraluminal thrombus in the visualized distal descending thoracic aorta protruding centrally down the lumen of the proximal abdominal aorta, terminating just above the origin of the celiac axis. The more distal abdominal aorta is unremarkable. No dissection, aneurysm, or stenosis. Celiac: Patent without evidence of aneurysm, dissection, vasculitis or significant stenosis. Distal branching unremarkable. SMA: Patent without evidence of aneurysm, dissection, vasculitis or significant stenosis. Renals: Both renal arteries are patent without evidence of aneurysm, dissection, vasculitis, fibromuscular dysplasia or significant stenosis. IMA: Patent without evidence of aneurysm, dissection, vasculitis or significant stenosis. Inflow: Patent without evidence of aneurysm, dissection, vasculitis or significant stenosis. Proximal Outflow: Incomplete opacification, without definite pathology Veins: Dedicated venous phase imaging not obtained. Review of the MIP images confirms the above findings. NON-VASCULAR Lower chest: Bilateral pulmonary emboli as described on prior study. Patchy peripheral airspace opacities in both lung bases stable. No pleural or pericardial effusion. Hepatobiliary: No focal liver abnormality is seen. No  gallstones, gallbladder wall thickening, or biliary dilatation. Pancreas: Unremarkable. No pancreatic ductal dilatation or surrounding inflammatory changes. Spleen: 2 wedge-shaped peripheral regions of decreased enhancement may represent sequela of early scan timing versus subcapsular infarcts. Adrenals/Urinary Tract: Normal adrenals. Bilateral striated nephrograms on this early phase imaging. No urolithiasis or hydronephrosis. Urinary bladder incompletely distended. Stomach/Bowel: Stomach is nondistended. Small bowel nondilated. Appendix not discretely identified. The colon is nondilated, unremarkable. Lymphatic: Prominent inguinal adenopathy left greater than right, up to 13 mm short axis diameter. Bilateral iliac chain adenopathy up to 15 mm . Prominent subcentimeter left para-aortic and aortocaval retroperitoneal lymph nodes. No mesenteric adenopathy. Reproductive: Uterus and bilateral adnexa are unremarkable. Other: No ascites.  No free air. Musculoskeletal: Degenerative disc disease L2-S1. Negative for fracture or worrisome bone lesion. IMPRESSION: VASCULAR 1. 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 2. No definite distal emboli are identified. NON-VASCULAR *Possible splenic infarcts versus inhomogeneous early phase enhancement. *Bilateral inguinal and iliac adenopathy, possibly reactive but nonspecific. Electronically Signed   By: Lucrezia Europe M.D.   On: 05/15/2016 13:48  Assessment & Plan:   Anticoagulated on Coumadin Subtherapeutic last 2 INR checks , coumadin adjusted by urgent care  Cont current dose , check INR today   Acute pulmonary embolus (HCC) Unprovoked PE w/ risk factors of obesity /chronic leg swelling (essentially second unprovoked clot w/ supercial thrombus in left saphenous in 2017 (tx x 3 month )  Continue on coumadin w/ goal INR 2-3.  If lifelong  therapy is decided on , given her age would consider hematology referral.   Plan  Patient Instructions  2 D Echo in 3 months  Continue on Coumadin as directed .  Keep coumadin checks as planned.  Continue on oxygen 2l/m with activity and At bedtime  .  Do not take any NSAIDS -advil, aleve, ibuprofen, etc.  Follow up with Dr. Ashok Cordia in 2 months and As needed   INR check today .  Please contact office for sooner follow up if symptoms do not improve or worsen or seek emergency care         Rexene Edison, NP 06/08/2016

## 2016-06-08 NOTE — Patient Instructions (Addendum)
2 D Echo in 3 months  Continue on Coumadin as directed .  Keep coumadin checks as planned.  Continue on oxygen 2l/m with activity and At bedtime  .  Do not take any NSAIDS -advil, aleve, ibuprofen, etc.  Follow up with Dr. Ashok Cordia in 2 months and As needed   INR check today .  Please contact office for sooner follow up if symptoms do not improve or worsen or seek emergency care

## 2016-06-08 NOTE — Telephone Encounter (Signed)
HH calling to let Dr Tamala Julian know that they are doing their discharge visit for this pt next week. Any questions can be called to [phone]

## 2016-06-10 NOTE — Progress Notes (Signed)
Note reviewed.  Sonia Baller Ashok Cordia, M.D. [facility] Pager:  [phone] After 3pm or if no response, call [phone] 10:09 PM 06/10/16

## 2016-06-11 ENCOUNTER — Telehealth: Payer: Self-pay | Admitting: Adult Health

## 2016-06-11 NOTE — Telephone Encounter (Signed)
INR is improving cont on current dose, was just increased 4 days ago. Keep follow up next week for repeat INR .    Called and lmom of the details of her lab results per TP per pts request. Nothing further is needed.

## 2016-06-12 NOTE — Progress Notes (Signed)
Per pt's chart, she returned call yesterday 3.19.18 Will close

## 2016-06-12 NOTE — Progress Notes (Signed)
LMOM TCB x2

## 2016-06-13 ENCOUNTER — Ambulatory Visit (INDEPENDENT_AMBULATORY_CARE_PROVIDER_SITE_OTHER): Payer: BLUE CROSS/BLUE SHIELD | Admitting: Physician Assistant

## 2016-06-13 VITALS — BP 104/60 | HR 100 | Temp 98.6°F | Resp 20 | Ht 64.0 in | Wt >= 6400 oz

## 2016-06-13 DIAGNOSIS — E118 Type 2 diabetes mellitus with unspecified complications: Secondary | ICD-10-CM | POA: Diagnosis not present

## 2016-06-13 DIAGNOSIS — I2699 Other pulmonary embolism without acute cor pulmonale: Secondary | ICD-10-CM | POA: Diagnosis not present

## 2016-06-13 DIAGNOSIS — Z7901 Long term (current) use of anticoagulants: Secondary | ICD-10-CM

## 2016-06-13 DIAGNOSIS — Z5181 Encounter for therapeutic drug level monitoring: Secondary | ICD-10-CM | POA: Diagnosis not present

## 2016-06-13 LAB — PROTIME-INR
INR: 1.6 — ABNORMAL HIGH (ref 0.8–1.2)
Prothrombin Time: 16.7 s — ABNORMAL HIGH (ref 9.1–12.0)

## 2016-06-13 LAB — VITAMIN D 25 HYDROXY (VIT D DEFICIENCY, FRACTURES): Vit D, 25-Hydroxy: 6.6

## 2016-06-13 NOTE — Patient Instructions (Addendum)
IF you received an x-ray today, you will receive an invoice from [facility]. Please contact [facility] at [phone] with questions or concerns regarding your invoice.   IF you received labwork today, you will receive an invoice from [facility]. Please contact [facility] at [phone] with questions or concerns regarding your invoice.   Our billing staff will not be able to assist you with questions regarding bills from these companies.  You will be contacted with the lab results as soon as they are available. The fastest way to get your results is to activate your My Chart account. Instructions are located on the last page of this paperwork. If you have not heard from [place] regarding the results in 2 weeks, please contact this office.   Please schedule foot care with your podiatrist and call us with name of the podiatrist whom you go to. Please review these foods and diet.   I will contact you with the results of the coumadin, and how we are to proceed prior to your follow up with the coumadin management team next week.  Diabetes Mellitus and Food It is important for you to manage your blood sugar (glucose) level. Your blood glucose level can be greatly affected by what you eat. Eating healthier foods in the appropriate amounts throughout the day at about the same time each day will help you control your blood glucose level. It can also help slow or prevent worsening of your diabetes mellitus. Healthy eating may even help you improve the level of your blood pressure and reach or maintain a healthy weight. General recommendations for healthful eating and cooking habits include:  Eating meals and snacks regularly. Avoid going long periods of time without eating to lose weight.  Eating a diet that consists mainly of plant-based foods, such as fruits, vegetables, nuts, legumes, and whole grains.  Using low-heat cooking methods, such as baking, instead of high-heat cooking  methods, such as deep frying. Work with your dietitian to make sure you understand how to use the Nutrition Facts information on food labels. How can food affect me? Carbohydrates  Carbohydrates affect your blood glucose level more than any other type of food. Your dietitian will help you determine how many carbohydrates to eat at each meal and teach you how to count carbohydrates. Counting carbohydrates is important to keep your blood glucose at a healthy level, especially if you are using insulin or taking certain medicines for diabetes mellitus. Alcohol  Alcohol can cause sudden decreases in blood glucose (hypoglycemia), especially if you use insulin or take certain medicines for diabetes mellitus. Hypoglycemia can be a life-threatening condition. Symptoms of hypoglycemia (sleepiness, dizziness, and disorientation) are similar to symptoms of having too much alcohol. If your health care provider has given you approval to drink alcohol, do so in moderation and use the following guidelines:  Women should not have more than one drink per day, and men should not have more than two drinks per day. One drink is equal to:  12 oz of beer.  5 oz of wine.  1 oz of hard liquor.  Do not drink on an empty stomach.  Keep yourself hydrated. Have water, diet soda, or unsweetened iced tea.  Regular soda, juice, and other mixers might contain a lot of carbohydrates and should be counted. What foods are not recommended? As you make food choices, it is important to remember that all foods are not the same. Some foods have fewer nutrients per serving than other  foods, even though they might have the same number of calories or carbohydrates. It is difficult to get your body what it needs when you eat foods with fewer nutrients. Examples of foods that you should avoid that are high in calories and carbohydrates but low in nutrients include:  Trans fats (most processed foods list trans fats on the Nutrition  Facts label).  Regular soda.  Juice.  Candy.  Sweets, such as cake, pie, doughnuts, and cookies.  Fried foods. What foods can I eat? Eat nutrient-rich foods, which will nourish your body and keep you healthy. The food you should eat also will depend on several factors, including:  The calories you need.  The medicines you take.  Your weight.  Your blood glucose level.  Your blood pressure level.  Your cholesterol level. You should eat a variety of foods, including:  Protein.  Lean cuts of meat.  Proteins low in saturated fats, such as fish, egg whites, and beans. Avoid processed meats.  Fruits and vegetables.  Fruits and vegetables that may help control blood glucose levels, such as apples, mangoes, and yams.  Dairy products.  Choose fat-free or low-fat dairy products, such as milk, yogurt, and cheese.  Grains, bread, pasta, and rice.  Choose whole grain products, such as multigrain bread, whole oats, and brown rice. These foods may help control blood pressure.  Fats.  Foods containing healthful fats, such as nuts, avocado, olive oil, canola oil, and fish. Does everyone with diabetes mellitus have the same meal plan? Because every person with diabetes mellitus is different, there is not one meal plan that works for everyone. It is very important that you meet with a dietitian who will help you create a meal plan that is just right for you. This information is not intended to replace advice given to you by your health care provider. Make sure you discuss any questions you have with your health care provider. Document Released: 12/07/2004 Document Revised: 08/18/2015 Document Reviewed: 02/06/2013 Elsevier Interactive Patient Education  2017 [employer].

## 2016-06-13 NOTE — Progress Notes (Signed)
PRIMARY CARE AT [facility] [address] [place] [address] [phone]  Date:  06/13/2016   Name:  Alexa [name]   DOB:  [birth year]   MRN:  [id number]  PCP:  Ivar Drape, PA    History of Present Illness:  Alexa [name] is a 41 y.o. female patient who presents to PCP with  Chief Complaint  Patient presents with  . Follow-up    Pt states INR needs to be checked    --patient is here today for follow up of pulmonary embolism and repeat/inr.  She was seen here 1 week ago where ptinr was 1.5 after 13 days of treatment with coumadin '10mg'$ .  She was then increased to '12mg'$  and advised to return in one week.  ptinr rechecked 6 days ago by Pulmonology. After 4 days of treatment PT INR had increased to 1.9. She was advised to continue on the 12 mg and have this rechecked at today's visit. Pulmonology recommended that she have an echo in 3 months. She is to also use the oxygen with activity and at bedtime. Patient reports that she has no concerns today. She does not have chest pain or cough. She returned to work 2 days ago without any complication or difficulty.    Patient Active Problem List   Diagnosis Date Noted  . Anticoagulated on Coumadin 06/04/2016  . Other acute pulmonary embolism without acute cor pulmonale ([place]) 06/04/2016  . Acute bronchitis 05/19/2016  . Acute pulmonary embolus ([place]) 05/14/2016  . Acute respiratory failure with hypoxia ([place]) 05/14/2016  . Hypoxia   . Class 3 obesity due to excess calories with serious comorbidity and body mass index (BMI) greater than or equal to 70 in adult ([place]) 04/15/2016  . Athlete's foot 10/08/2015  . Diabetes ([place]) 06/22/2015  . Benign essential HTN 06/22/2015  . 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 BMI 70 and over, adult ([place]) 06/22/2015  . Vitamin D deficiency 06/22/2015  . Iron deficiency anemia 06/22/2015    Past Medical History:  Diagnosis Date  . Gestational diabetes   . Hypertension     Past Surgical  History:  Procedure Laterality Date  . CESAREAN SECTION      Social History  Substance Use Topics  . Smoking status: Former Smoker    Types: Cigarettes  . Smokeless tobacco: Never Used     Comment: only smokes when she drinks alcohol - 1 pack per week  . Alcohol use 2.4 - 3.6 oz/week    4 - 6 Standard drinks or equivalent per week     Comment: 13-4 times/weeks    Family History  Problem Relation Age of Onset  . Diabetes Maternal Grandmother   . Hypertension Mother     Allergies  Allergen Reactions  . Penicillins Hives    Has patient had a PCN reaction causing immediate rash, facial/tongue/throat swelling, SOB or lightheadedness with hypotension: No Has patient had a PCN reaction causing severe rash involving mucus membranes or skin necrosis: No Has patient had a PCN reaction that required hospitalization No Has patient had a PCN reaction occurring within the last 10 years: No If all of the above answers are "NO", then may proceed with Cephalosporin use.    Medication list has been reviewed and updated.  Current Outpatient Prescriptions on File Prior to Visit  Medication Sig Dispense Refill  . ferrous sulfate 325 (65 FE) MG tablet Take 325 mg by mouth daily with breakfast.    . furosemide (LASIX) 40  MG tablet Take 1 tablet (40 mg total) by mouth daily. 30 tablet 3  . losartan (COZAAR) 100 MG tablet Take 1 tablet (100 mg total) by mouth daily. 30 tablet 2  . metFORMIN (GLUCOPHAGE) 500 MG tablet Take 1 tablet (500 mg total) by mouth 2 (two) times daily with a meal. 180 tablet 3  . nystatin (MYCOSTATIN/NYSTOP) powder Apply topically 4 (four) times daily. 60 g 1  . warfarin (COUMADIN) 6 MG tablet Take 2 tablets (12 mg total) by mouth daily. 60 tablet 1   No current facility-administered medications on file prior to visit.     ROS ROS otherwise unremarkable unless listed above.  Physical Examination: BP 104/60   Pulse 100   Temp 98.6 F (37 C) (Oral)   Resp 20   Ht 5'  4" (1.626 m)   Wt (!) 404 lb (183.3 kg)   LMP 05/18/2016 Comment: negative urine pregnancy test 05-15-2016  SpO2 92%   BMI 69.35 kg/m  Ideal Body Weight: Weight in (lb) to have BMI = 25: 145.3  Physical Exam  Constitutional: She is oriented to person, place, and time. She appears well-developed and well-nourished. No distress.  HENT:  Head: Normocephalic and atraumatic.  Right Ear: External ear normal.  Left Ear: External ear normal.  Eyes: Conjunctivae and EOM are normal. Pupils are equal, round, and reactive to light.  Cardiovascular: Normal rate.   Pulmonary/Chest: Effort normal. No apnea. No respiratory distress. She has no decreased breath sounds. She has no wheezes. She has no rhonchi.  Feet:  Right Foot:  Protective Sensation: 6 sites tested. 6 sites sensed.  Skin Integrity: Positive for dry skin (sheet of yellow dead skin along the sole of the entire foot, which sloughs without erythema or tenderness--bilaterally).  Left Foot:  Protective Sensation: 6 sites tested. 6 sites sensed.  Neurological: She is alert and oriented to person, place, and time.  Skin: She is not diaphoretic.  Psychiatric: She has a normal mood and affect. Her behavior is normal.   INR/Prothrombin Time: 1.6 today,   Assessment and Plan: [name] is a 41 y.o. female who is here today for follow up inr. --rechecking stat today.  Change pending result --acquiring a1c.  She is a new patient and requesting establishing care.  I have advised the patient to follow up with podiatry, as well as obtaining the name of her podiatrist so we can have that knowledge and refer if she does not have it. --advised to return for a physical exam.  Anticoagulated on Coumadin - Plan: Protime-INR, CANCELED: Protime-INR  Other acute pulmonary embolism without acute cor pulmonale ([place]) - Plan: CANCELED: Protime-INR  Type 2 diabetes mellitus with complication, without long-term current use of insulin ([place]) - Plan:  CMP14+EGFR, Hemoglobin A1c  Ivar Drape, PA-C [facility] 3/21/201810:18 AM

## 2016-06-14 ENCOUNTER — Telehealth: Payer: Self-pay | Admitting: Family Medicine

## 2016-06-14 LAB — CMP14+EGFR
ALBUMIN: 3.9 g/dL (ref 3.5–5.5)
ALK PHOS: 49 IU/L (ref 39–117)
ALT: 14 IU/L (ref 0–32)
AST: 14 IU/L (ref 0–40)
Albumin/Globulin Ratio: 1.4 (ref 1.2–2.2)
BILIRUBIN TOTAL: 0.5 mg/dL (ref 0.0–1.2)
BUN / CREAT RATIO: 16 (ref 9–23)
BUN: 11 mg/dL (ref 6–20)
CHLORIDE: 99 mmol/L (ref 96–106)
CO2: 26 mmol/L (ref 18–29)
CREATININE: 0.67 mg/dL (ref 0.57–1.00)
Calcium: 9.2 mg/dL (ref 8.7–10.2)
GFR calc Af Amer: 129 mL/min/{1.73_m2} (ref >59)
GFR calc non Af Amer: 112 mL/min/{1.73_m2} (ref >59)
GLUCOSE: 103 mg/dL — AB (ref 65–99)
Globulin, Total: 2.8 g/dL (ref 1.5–4.5)
Potassium: 4 mmol/L (ref 3.5–5.2)
SODIUM: 139 mmol/L (ref 134–144)
Total Protein: 6.7 g/dL (ref 6.0–8.5)

## 2016-06-14 LAB — HEMOGLOBIN A1C
ESTIMATED AVERAGE GLUCOSE: 146 mg/dL
HEMOGLOBIN A1C: 6.7 % — AB (ref 4.8–5.6)

## 2016-06-14 MED ORDER — WARFARIN SODIUM 7.5 MG PO TABS: 15.0000 mg | ORAL_TABLET | Freq: Every day | ORAL | 1 refills | Status: DC

## 2016-06-14 NOTE — Telephone Encounter (Signed)
Pt calling to let stephanie know that she had received her message and that she want to know about her lab results

## 2016-06-15 ENCOUNTER — Telehealth: Payer: Self-pay | Admitting: Physician Assistant

## 2016-06-15 NOTE — Telephone Encounter (Signed)
l/m with stephanies message

## 2016-06-15 NOTE — Telephone Encounter (Signed)
The lab was lower than when she was seen at pulmonology.  Therefore we will increase coumadin 15mg , and you will have this rechecked with the coumadin management.  You should have picked up this prescription already a1c looks like it has gone up from 6.7 to 6.2, however this is looking good!  Watch your diet as we discussed.  Kidney and liver functioning are looking good. You need to let me know who your podiatrist is..? rtc for physical exam within 3 months.  Ivar Drape, PA-C [facility] 3/23/20188:50 AM

## 2016-06-15 NOTE — Telephone Encounter (Signed)
Pt is wanting to let stephanie know that she got her coumidin yesterday and that the podiotrist was [facility] and when can she start swimming  Best number [phone]

## 2016-06-19 ENCOUNTER — Ambulatory Visit (INDEPENDENT_AMBULATORY_CARE_PROVIDER_SITE_OTHER): Payer: BLUE CROSS/BLUE SHIELD | Admitting: Cardiology

## 2016-06-19 ENCOUNTER — Encounter: Payer: Self-pay | Admitting: Cardiology

## 2016-06-19 DIAGNOSIS — I2699 Other pulmonary embolism without acute cor pulmonale: Secondary | ICD-10-CM | POA: Diagnosis not present

## 2016-06-19 DIAGNOSIS — Z7901 Long term (current) use of anticoagulants: Secondary | ICD-10-CM | POA: Insufficient documentation

## 2016-06-19 DIAGNOSIS — E6609 Other obesity due to excess calories: Secondary | ICD-10-CM

## 2016-06-19 DIAGNOSIS — I10 Essential (primary) hypertension: Secondary | ICD-10-CM

## 2016-06-19 DIAGNOSIS — I119 Hypertensive heart disease without heart failure: Secondary | ICD-10-CM | POA: Diagnosis not present

## 2016-06-19 DIAGNOSIS — R400 Somnolence: Secondary | ICD-10-CM

## 2016-06-19 DIAGNOSIS — I519 Heart disease, unspecified: Secondary | ICD-10-CM

## 2016-06-19 DIAGNOSIS — IMO0001 Reserved for inherently not codable concepts without codable children: Secondary | ICD-10-CM

## 2016-06-19 DIAGNOSIS — Z6841 Body Mass Index (BMI) 40.0 and over, adult: Secondary | ICD-10-CM | POA: Diagnosis not present

## 2016-06-19 DIAGNOSIS — E1149 Type 2 diabetes mellitus with other diabetic neurological complication: Secondary | ICD-10-CM

## 2016-06-19 DIAGNOSIS — I7410 Embolism and thrombosis of unspecified parts of aorta: Secondary | ICD-10-CM | POA: Insufficient documentation

## 2016-06-19 DIAGNOSIS — I5189 Other ill-defined heart diseases: Secondary | ICD-10-CM | POA: Insufficient documentation

## 2016-06-19 DIAGNOSIS — R0683 Snoring: Secondary | ICD-10-CM | POA: Diagnosis not present

## 2016-06-19 NOTE — Assessment & Plan Note (Signed)
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 CTA ordered for June with f/u with Dr Trula Slade after that

## 2016-06-19 NOTE — Progress Notes (Signed)
06/19/2016 Alexa [name]   1976/03/24  [id number]  Primary Physician Ivar Drape, PA Primary Cardiologist: New (Dr Gwenlyn Found prn)  HPI:  Pleasant 41 y/o morbidly obese AA female seen in the office today for Coumadin Rx adjustment. The pt had a submassive PE in Feb 2018. She is being followed by Dr Ashok Cordia. At the time of her PE she was also noted to have aortic thrombus and after consultation with hematology and cardiovascular surgery it was decided that Xarelto was not indicated in this situation and she was placed on Coumadin. The pt has been doing relatively well. 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 She has neuropathy in her feet and this limits her, she uses a cane for balance. She apparently had observed sleep apnea in the hospital and she will need a formal sleep study. Her echo showed normal LVF-60-65% with grade 2 DD, moderate RVD, and PA pressure of 56 mmHg. A f/u echo has been ordered by Dr Ashok Cordia for May.    Current Outpatient Prescriptions  Medication Sig Dispense Refill  . ferrous sulfate 325 (65 FE) MG tablet Take 325 mg by mouth daily with breakfast.    . furosemide (LASIX) 40 MG tablet Take 1 tablet (40 mg total) by mouth daily. 30 tablet 3  . losartan (COZAAR) 100 MG tablet Take 1 tablet (100 mg total) by mouth daily. 30 tablet 2  . metFORMIN (GLUCOPHAGE) 500 MG tablet Take 1 tablet (500 mg total) by mouth 2 (two) times daily with a meal. 180 tablet 3  . nystatin (MYCOSTATIN/NYSTOP) powder Apply topically 4 (four) times daily. 60 g 1  . warfarin (COUMADIN) 7.5 MG tablet Take 2 tablets (15 mg total) by mouth daily. 60 tablet 1   No current facility-administered medications for this visit.     Allergies  Allergen Reactions  . Penicillins Hives    Has patient had a PCN reaction causing immediate rash, facial/tongue/throat swelling, SOB or lightheadedness with hypotension: No Has patient had a PCN reaction causing severe rash involving mucus membranes or skin  necrosis: No Has patient had a PCN reaction that required hospitalization No Has patient had a PCN reaction occurring within the last 10 years: No If all of the above answers are "NO", then may proceed with Cephalosporin use.    Past Medical History:  Diagnosis Date  . Gestational diabetes   . Hypertension     Social History   Social History  . Marital status: Divorced    Spouse name: n/a  . Number of children: 0  . Years of education: N/A   Occupational History  . [place]     Daycare   Social History Main Topics  . Smoking status: Former Smoker    Types: Cigarettes  . Smokeless tobacco: Never Used     Comment: only smokes when she drinks alcohol - 1 pack per week  . Alcohol use 2.4 - 3.6 oz/week    4 - 6 Standard drinks or equivalent per week     Comment: 13-4 times/weeks  . Drug use: No  . Sexual activity: Not on file   Other Topics Concern  . Not on file   Social History Narrative   Her children live with her.   Ex-husband lives locally.     Family History  Problem Relation Age of Onset  . Diabetes Maternal Grandmother   . Hypertension Mother      Review of Systems: General: negative for chills,  fever, night sweats or weight changes.  Cardiovascular: negative for chest pain, dyspnea on exertion, edema, orthopnea, palpitations, paroxysmal nocturnal dyspnea or shortness of breath Dermatological: negative for rash Respiratory: negative for cough or wheezing Urologic: negative for hematuria Abdominal: negative for nausea, vomiting, diarrhea, bright red blood per rectum, melena, or hematemesis Neurologic: negative for visual changes, syncope, or dizziness All other systems reviewed and are otherwise negative except as noted above.    Blood pressure (!) 152/104, pulse 96, height 5\' 4"  (1.626 m), weight (!) 406 lb 6.4 oz (184.3 kg), SpO2 94 %.  General appearance: alert, cooperative, no distress and morbidly obese Neck: no carotid  bruit Lungs: clear to auscultation bilaterally Heart: regular rate and rhythm Abdomen: morbid obesity Extremities: 1+ bilateral LE pitting edema Pulses: difficult to palpate Skin: Skin color, texture, turgor normal. 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 CTA ordered for June with f/u with Dr Trula Slade after that  Class 3 obesity due to excess calories with serious comorbidity and body mass index (BMI) greater than or equal to 70 in adult ([facility]CC) BMI 69.7. 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 He suggested a Hematology consult as recommended in the DC summary to follow anticoagulation since she appears to be a more complicated patient. The pt has an echo and follow up scheduled for Dr Ashok Cordia in May and a CTA and follow up with Dr Trula Slade in June. I did order a sleep study. She can follow up with Dr Claiborne Billings for sleep apnea, Dr Gwenlyn Found with see her PRN.   Kerin Ransom PA-C 06/19/2016 9:14 AM

## 2016-06-19 NOTE — Assessment & Plan Note (Signed)
NIDDM with LE neuropathy

## 2016-06-19 NOTE — Assessment & Plan Note (Signed)
Submassive PE Feb 2018

## 2016-06-19 NOTE — Patient Instructions (Addendum)
Medication Instructions:  Your physician recommends that you continue on your current medications as directed. Please refer to the Current Medication list given to you today.  Labwork: NONE   Testing/Procedures: Your physician has recommended that you have a sleep study. This test records several body functions during sleep, including: brain activity, eye movement, oxygen and carbon dioxide blood levels, heart rate and rhythm, breathing rate and rhythm, the flow of air through your mouth and nose, snoring, body muscle movements, and chest and belly movement.  Follow-Up: Your physician recommends that you schedule a follow-up appointment in: AS NEEDED WITH DR Gwenlyn Found  Your physician recommends that you schedule a follow-up appointment in: [place].  Any Other Special Instructions Will Be Listed Below (If Applicable).  If you need a refill on your cardiac medications before your next appointment, please call your pharmacy.

## 2016-06-19 NOTE — Assessment & Plan Note (Signed)
Couamdin Rx- Xarelto not indicated for aortic thrombus

## 2016-06-19 NOTE — Assessment & Plan Note (Signed)
Normal LVF with grade 2 DD 

## 2016-06-19 NOTE — Assessment & Plan Note (Signed)
HTN with DD- no CHF on exam

## 2016-06-19 NOTE — Assessment & Plan Note (Addendum)
BMI 69.7. Sleep apnea observed in the hospital

## 2016-06-20 ENCOUNTER — Telehealth: Payer: Self-pay | Admitting: Physician Assistant

## 2016-06-20 NOTE — Telephone Encounter (Signed)
PATIENT WOULD LIKE TO ASK [name] IF DR. BERRY FROM [facility] CALLED HER YESTERDAY? SHE HAD HER APPOINTMENT WITH HIM AND HE TOLD HER THAT IT WOULD BE BEST IF SHE SAW A HEMATOLOGIST THAT COULD KEEP TRACK OF HER COUMADIN LEVEL. SHE ALSO WANTS TO ASK [name] IF SHE CAN GO SWIMMING? BEST PHONE [phone] (CELL) [place]

## 2016-06-21 ENCOUNTER — Telehealth: Payer: Self-pay | Admitting: Physician Assistant

## 2016-06-21 DIAGNOSIS — Z7901 Long term (current) use of anticoagulants: Secondary | ICD-10-CM

## 2016-06-21 NOTE — Telephone Encounter (Signed)
Pt wanted to let you know that she has been getting light headed in the afternoons a lot lately.

## 2016-06-22 ENCOUNTER — Other Ambulatory Visit: Payer: BLUE CROSS/BLUE SHIELD

## 2016-06-22 DIAGNOSIS — Z7901 Long term (current) use of anticoagulants: Secondary | ICD-10-CM

## 2016-06-22 MED ORDER — ONETOUCH ULTRASOFT LANCETS MISC: 12 refills | Status: DC

## 2016-06-22 MED ORDER — GLUCOSE BLOOD VI STRP: ORAL_STRIP | 12 refills | Status: DC

## 2016-06-22 NOTE — Telephone Encounter (Signed)
She should return.  Please have her return here if she is having lightheadedness.  She says that this has dissipated since.  Can you please order test strips and lancets.  100 per month, three times per day.

## 2016-06-22 NOTE — Telephone Encounter (Signed)
Symptoms x 3 days. When she stands quickly it occurs.  Cant check sugars now due to no strips  Upped coumadin to 15mg  qd last week, no check scheduled yet. Referred to hematologist?  I see 06/04/16 referral in for coumadin monitoring?  Allergy symptoms have started now too.   Can she start swimming?  Needs refill of strips and lancets too. One touch  Have you seen her old records?

## 2016-06-23 LAB — PROTIME-INR
INR: 2.7 — ABNORMAL HIGH (ref 0.8–1.2)
Prothrombin Time: 26.5 s — ABNORMAL HIGH (ref 9.1–12.0)

## 2016-06-23 MED ORDER — GLUCOSE BLOOD VI STRP: ORAL_STRIP | 3 refills | Status: DC

## 2016-06-23 NOTE — Telephone Encounter (Signed)
See next note

## 2016-06-30 ENCOUNTER — Telehealth: Payer: Self-pay

## 2016-06-30 NOTE — Telephone Encounter (Signed)
[place] Certification and Plan of Care completed and signed by Ivar Drape [facility] and faxed back to [facility] [phone]

## 2016-07-02 ENCOUNTER — Other Ambulatory Visit: Payer: Self-pay | Admitting: Physician Assistant

## 2016-07-02 DIAGNOSIS — I2699 Other pulmonary embolism without acute cor pulmonale: Secondary | ICD-10-CM

## 2016-07-02 DIAGNOSIS — Z7901 Long term (current) use of anticoagulants: Secondary | ICD-10-CM

## 2016-07-02 NOTE — Progress Notes (Unsigned)
Left voicemail to return today or tomorrow.

## 2016-07-05 ENCOUNTER — Ambulatory Visit: Payer: BLUE CROSS/BLUE SHIELD

## 2016-07-05 DIAGNOSIS — I2699 Other pulmonary embolism without acute cor pulmonale: Secondary | ICD-10-CM

## 2016-07-05 DIAGNOSIS — Z7901 Long term (current) use of anticoagulants: Secondary | ICD-10-CM

## 2016-07-05 LAB — PROTIME-INR
INR: 3 — AB (ref 0.8–1.2)
Prothrombin Time: 29.6 s — ABNORMAL HIGH (ref 9.1–12.0)

## 2016-07-09 ENCOUNTER — Encounter (HOSPITAL_COMMUNITY): Payer: BLUE CROSS/BLUE SHIELD

## 2016-07-09 ENCOUNTER — Encounter: Payer: BLUE CROSS/BLUE SHIELD | Admitting: Surgery

## 2016-07-09 ENCOUNTER — Other Ambulatory Visit: Payer: Self-pay | Admitting: Physician Assistant

## 2016-07-09 DIAGNOSIS — I2699 Other pulmonary embolism without acute cor pulmonale: Secondary | ICD-10-CM

## 2016-07-09 DIAGNOSIS — Z7901 Long term (current) use of anticoagulants: Secondary | ICD-10-CM

## 2016-07-13 ENCOUNTER — Other Ambulatory Visit (INDEPENDENT_AMBULATORY_CARE_PROVIDER_SITE_OTHER): Payer: BLUE CROSS/BLUE SHIELD | Admitting: Physician Assistant

## 2016-07-13 DIAGNOSIS — I2699 Other pulmonary embolism without acute cor pulmonale: Secondary | ICD-10-CM | POA: Diagnosis not present

## 2016-07-13 DIAGNOSIS — Z5181 Encounter for therapeutic drug level monitoring: Secondary | ICD-10-CM

## 2016-07-13 DIAGNOSIS — Z7901 Long term (current) use of anticoagulants: Secondary | ICD-10-CM | POA: Diagnosis not present

## 2016-07-14 LAB — PROTIME-INR
INR: 2.5 — ABNORMAL HIGH (ref 0.8–1.2)
Prothrombin Time: 24.8 s — ABNORMAL HIGH (ref 9.1–12.0)

## 2016-07-20 ENCOUNTER — Other Ambulatory Visit: Payer: Self-pay | Admitting: *Deleted

## 2016-07-20 ENCOUNTER — Telehealth: Payer: Self-pay | Admitting: *Deleted

## 2016-07-20 DIAGNOSIS — R0683 Snoring: Secondary | ICD-10-CM

## 2016-07-20 DIAGNOSIS — R400 Somnolence: Secondary | ICD-10-CM

## 2016-07-20 DIAGNOSIS — R0902 Hypoxemia: Secondary | ICD-10-CM

## 2016-07-20 NOTE — Telephone Encounter (Signed)
Called [facility] to cancel in lab sleep study. Patient was changed to home sleep study. Scheduled for June 11th @ 2:00.

## 2016-07-20 NOTE — Telephone Encounter (Signed)
-----   Message from Lillia Pauls sent at 07/18/2016 12:58 PM EDT ----- Regarding: In lab denied Mariann Laster  Per Oren Binet e pt doesn't meet criteria for in lab study.  Needs HST.  HST is authorized.  Please contact pt and sleep lab to canx/schedule.  Thanks  Charmaine

## 2016-07-20 NOTE — Telephone Encounter (Signed)
Left message to return a call . Need to notify her of sleep study appointment change.

## 2016-07-25 ENCOUNTER — Encounter (HOSPITAL_BASED_OUTPATIENT_CLINIC_OR_DEPARTMENT_OTHER): Payer: BLUE CROSS/BLUE SHIELD

## 2016-07-26 ENCOUNTER — Other Ambulatory Visit (INDEPENDENT_AMBULATORY_CARE_PROVIDER_SITE_OTHER): Payer: BLUE CROSS/BLUE SHIELD | Admitting: Physician Assistant

## 2016-07-26 ENCOUNTER — Telehealth: Payer: Self-pay | Admitting: Physician Assistant

## 2016-07-26 ENCOUNTER — Telehealth: Payer: Self-pay | Admitting: Cardiovascular Disease

## 2016-07-26 DIAGNOSIS — Z7901 Long term (current) use of anticoagulants: Secondary | ICD-10-CM

## 2016-07-26 DIAGNOSIS — Z5181 Encounter for therapeutic drug level monitoring: Secondary | ICD-10-CM

## 2016-07-26 NOTE — Telephone Encounter (Signed)
Return today or tomorrow.  This referral was sent, and responded by wrong department.  It said heart referred her to hematology, but they are not following up.    Anti-coagulation clinic which is part of the heartcare, should manage this.  Please call or have the referral team contact anti-coagulation clinic and schedule patient. Please do this asap.

## 2016-07-26 NOTE — Telephone Encounter (Signed)
New message      Returning a call to the nurse.  Pt did not get to do her sleep study last night.  Please call.

## 2016-07-26 NOTE — Telephone Encounter (Signed)
Discussed reason for home sleep study Sent message to scheduling to reschedule her follow up until after home study completed

## 2016-07-26 NOTE — Telephone Encounter (Signed)
Spoke with pt. Informed her that she may come today or tomorrow.

## 2016-07-26 NOTE — Telephone Encounter (Signed)
07/13/16 inr was 2.5 please advise Or other lab?

## 2016-07-26 NOTE — Telephone Encounter (Signed)
English - Pt wants to know when she needs to come back in for blood work. [phone]

## 2016-07-27 ENCOUNTER — Telehealth: Payer: Self-pay | Admitting: Cardiovascular Disease

## 2016-07-27 LAB — PROTIME-INR
INR: 2.8 — ABNORMAL HIGH (ref 0.8–1.2)
PROTHROMBIN TIME: 28.1 s — AB (ref 9.1–12.0)

## 2016-07-27 NOTE — Telephone Encounter (Signed)
Left message for patient to call and reschedule her sleep appt with Dr. Claiborne Billings on May 22 until after she has her in home sleep study.

## 2016-07-30 ENCOUNTER — Telehealth: Payer: Self-pay | Admitting: Cardiovascular Disease

## 2016-07-30 NOTE — Telephone Encounter (Signed)
Called patient to reschedule sleep clinic appointment with Dr. Claiborne Billings, but, her VM is not set up so could not receive messages.

## 2016-07-31 ENCOUNTER — Ambulatory Visit (HOSPITAL_COMMUNITY): Payer: BLUE CROSS/BLUE SHIELD | Attending: Cardiovascular Disease

## 2016-07-31 ENCOUNTER — Other Ambulatory Visit: Payer: Self-pay

## 2016-07-31 DIAGNOSIS — I2699 Other pulmonary embolism without acute cor pulmonale: Secondary | ICD-10-CM | POA: Insufficient documentation

## 2016-07-31 DIAGNOSIS — Z6841 Body Mass Index (BMI) 40.0 and over, adult: Secondary | ICD-10-CM | POA: Diagnosis not present

## 2016-07-31 DIAGNOSIS — Z720 Tobacco use: Secondary | ICD-10-CM | POA: Diagnosis not present

## 2016-07-31 DIAGNOSIS — E119 Type 2 diabetes mellitus without complications: Secondary | ICD-10-CM | POA: Diagnosis not present

## 2016-07-31 DIAGNOSIS — I10 Essential (primary) hypertension: Secondary | ICD-10-CM | POA: Diagnosis not present

## 2016-08-02 NOTE — Progress Notes (Signed)
LMOMTCB x 1 

## 2016-08-07 ENCOUNTER — Telehealth: Payer: Self-pay | Admitting: Pulmonary Disease

## 2016-08-07 NOTE — Telephone Encounter (Signed)
Notes recorded by Melvenia Needles, NP on 07/31/2016 at 5:07 PM EDT Compared to 05/15/2016, PAP is lower and signs of RV overload have resolved. Echo is improved  Will discuss in detail on return ov-keep follow up with Dr. Ashok Cordia  -------------------------- Left detailed message on pt's named VM at pt request.  Will close encounter.

## 2016-08-08 ENCOUNTER — Ambulatory Visit (INDEPENDENT_AMBULATORY_CARE_PROVIDER_SITE_OTHER): Payer: BLUE CROSS/BLUE SHIELD | Admitting: Pulmonary Disease

## 2016-08-08 ENCOUNTER — Other Ambulatory Visit (INDEPENDENT_AMBULATORY_CARE_PROVIDER_SITE_OTHER): Payer: BLUE CROSS/BLUE SHIELD

## 2016-08-08 ENCOUNTER — Telehealth: Payer: Self-pay | Admitting: Physician Assistant

## 2016-08-08 ENCOUNTER — Encounter: Payer: Self-pay | Admitting: Pulmonary Disease

## 2016-08-08 ENCOUNTER — Telehealth: Payer: Self-pay | Admitting: Pulmonary Disease

## 2016-08-08 VITALS — BP 124/80 | HR 113 | Ht 64.0 in | Wt >= 6400 oz

## 2016-08-08 DIAGNOSIS — Z7901 Long term (current) use of anticoagulants: Secondary | ICD-10-CM

## 2016-08-08 DIAGNOSIS — R590 Localized enlarged lymph nodes: Secondary | ICD-10-CM | POA: Diagnosis not present

## 2016-08-08 DIAGNOSIS — I2699 Other pulmonary embolism without acute cor pulmonale: Secondary | ICD-10-CM

## 2016-08-08 DIAGNOSIS — R591 Generalized enlarged lymph nodes: Secondary | ICD-10-CM | POA: Insufficient documentation

## 2016-08-08 DIAGNOSIS — R599 Enlarged lymph nodes, unspecified: Secondary | ICD-10-CM | POA: Insufficient documentation

## 2016-08-08 LAB — CBC WITH DIFFERENTIAL/PLATELET
BASOS ABS: 0 10*3/uL (ref 0.0–0.1)
Basophils Relative: 0.1 % (ref 0.0–3.0)
EOS PCT: 0 % (ref 0.0–5.0)
Eosinophils Absolute: 0 10*3/uL (ref 0.0–0.7)
HEMATOCRIT: 38 % (ref 36.0–46.0)
Hemoglobin: 12 g/dL (ref 12.0–15.0)
Lymphocytes Relative: 23.7 % (ref 12.0–46.0)
Lymphs Abs: 1.9 10*3/uL (ref 0.7–4.0)
MCHC: 31.5 g/dL (ref 30.0–36.0)
MCV: 79.2 fl (ref 78.0–100.0)
MONOS PCT: 12.6 % — AB (ref 3.0–12.0)
Monocytes Absolute: 1 10*3/uL (ref 0.1–1.0)
NEUTROS ABS: 5.2 10*3/uL (ref 1.4–7.7)
Neutrophils Relative %: 63.6 % (ref 43.0–77.0)
PLATELETS: 391 10*3/uL (ref 150.0–400.0)
RBC: 4.79 Mil/uL (ref 3.87–5.11)
RDW: 20.6 % — ABNORMAL HIGH (ref 11.5–15.5)
WBC: 8.1 10*3/uL (ref 4.0–10.5)

## 2016-08-08 NOTE — Telephone Encounter (Signed)
ATC patient to find out DME company so oxygen order could be discontinued per JN; voicemail was left. Will await for DME company name before placing order.

## 2016-08-08 NOTE — Progress Notes (Signed)
Subjective:    Patient ID: Alexa [name], female    DOB: 27-Sep-1975, 41 y.o.   MRN: [id number]  [facility].:  Follow-up for Pulmonary Embolism & Abdominal/Pelvic Lymphadenopathy.   HPI Pulmonary Embolism: Found on CT angiogram of the chest in February 2017. Patient also noted to have thrombus in the descending aorta without PFO on echocardiogram. Patient had a second hospitalization in February for an unprovoked pulmonary embolism. Discharged on Coumadin with management by outside office. Compliant with Coumadin. Denies any melena or hematochezia. She reported very heavy menses and lasted 4-5 days. No hematuria.   Abdominal/pelvic lymphadenopathy: Seen on CT angiogram of the abdomen/pelvis. No fever, chills, or sweats. No appreciated adenopathy.   Review of Systems She has had some near-syncope with standing quickly. Denies any chest pain or pressure. No dyspnea or coughing.   Allergies  Allergen Reactions  . Penicillins Hives    Has patient had a PCN reaction causing immediate rash, facial/tongue/throat swelling, SOB or lightheadedness with hypotension: No Has patient had a PCN reaction causing severe rash involving mucus membranes or skin necrosis: No Has patient had a PCN reaction that required hospitalization No Has patient had a PCN reaction occurring within the last 10 years: No If all of the above answers are "NO", then may proceed with Cephalosporin use.    Current Outpatient Prescriptions on File Prior to Visit  Medication Sig Dispense Refill  . ferrous sulfate 325 (65 FE) MG tablet Take 325 mg by mouth daily with breakfast.    . furosemide (LASIX) 40 MG tablet Take 1 tablet (40 mg total) by mouth daily. 30 tablet 3  . glucose blood test strip ascensia  Meter/strips preferred Test daily Use as instructed Dx.dm 100 each 3  . Lancets (ONETOUCH ULTRASOFT) lancets Test daily Use as instructed dm2 100 each 12  . losartan (COZAAR) 100 MG tablet Take 1 tablet (100 mg total) by mouth  daily. 30 tablet 2  . metFORMIN (GLUCOPHAGE) 500 MG tablet Take 1 tablet (500 mg total) by mouth 2 (two) times daily with a meal. 180 tablet 3  . nystatin (MYCOSTATIN/NYSTOP) powder Apply topically 4 (four) times daily. 60 g 1  . warfarin (COUMADIN) 7.5 MG tablet Take 2 tablets (15 mg total) by mouth daily. 60 tablet 1   No current facility-administered medications on file prior to visit.     Past Medical History:  Diagnosis Date  . Gestational diabetes   . Hypertension     Past Surgical History:  Procedure Laterality Date  . CESAREAN SECTION      Family History  Problem Relation Age of Onset  . Diabetes Maternal Grandmother   . Hypertension Mother     Social History   Social History  . Marital status: Divorced    Spouse name: n/a  . Number of children: 0  . Years of education: N/A   Occupational History  . [employer]     Daycare   Social History Main Topics  . Smoking status: Former Smoker    Types: Cigarettes  . Smokeless tobacco: Never Used     Comment: only smokes when she drinks alcohol - 1 pack per week  . Alcohol use 2.4 - 3.6 oz/week    4 - 6 Standard drinks or equivalent per week     Comment: 13-4 times/weeks  . Drug use: No  . Sexual activity: Not Asked   Other Topics Concern  . None   Social History Narrative   Her children  live with her.   Ex-husband lives locally.      Objective:   Physical Exam BP 124/80 (BP Location: Left Wrist, Cuff Size: Normal)   Pulse (!) 113   Ht 5\' 4"  (1.626 m)   Wt (!) 415 lb 6.4 oz (188.4 kg)   SpO2 95%   BMI 71.30 kg/m  General:  Morbidly obese. No distress. Comfortable. Integument:  Warm & dry. No rash on exposed skin. No bruising on exposed skin. Extremities:  No cyanosis or clubbing.  HEENT:  Moist mucus membranes. No oral ulcers. No scleral injection or icterus. Cardiovascular:  Regular rate. Regular rhythm. Bilateral lower extremity edema. Pulmonary:  Good aeration & clear to auscultation  bilaterally. Normal work of breathing on room air. Abdomen: Soft. Normal bowel sounds. Protuberant.  IMAGING VENOUS DUPLEX 05/14/16 (per vascular surgery): Severely difficult due to body habitus and somewhat inconclusive. No evidence of DVT.  CTA CHEST 05/14/16 (per radiologist):  Positive for acute pulmonary embolism with RV/LV ratio 1.26. Thrombus in distal descending thoracic aorta and proximal abdominal aorta. Mild basilar lung opacities.  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 No definite distal emboli are identified. Possible splenic infarcts versus inhomogeneous early phase enhancement. Bilateral inguinal and iliac adenopathy.  CARDIAC TTE (07/31/16): LV normal in size with EF 60-65%. No regional wall motion abnormalities. LV diastolic function normal LA & RA normal in size. RV normal in size and function. Pulmonary artery systolic pressure 36 mmHg. No aortic stenosis or regurgitation. Aortic root normal in size. No mitral stenosis or regurgitation. No pulmonic stenosis. Moderate valve poorly visualized. No tricuspid regurgitation. No pericardial effusion.  LABS 05/14/16 Prothrombin Gene Mutation:  Negative Factor V Leiden:  Negative     Assessment & Plan:  41 y.o. female with history of second pulmonary embolism that was unprovoked. Patient also with aortic thrombus and abdominal/pelvic lymphadenopathy.Reviewed patient's echocardiogram with her today. No evidence of RV dysfunction. The recurrent nature of her clot and the fact that it is unprovoked makes lifelong anticoagulation a high probability. However, given her young age I feel that evaluation by hematology is imperative. Additionally, she has abdominal/pelvic lymphadenopathy that needs to be assessed as well. Certainly this could be reactive. I instructed the  patient to contact my office if she had any new breathing problems or questions before her next appointment.  1. Recurrent pulmonary emboli:  Patient continuing on Coumadin with management by her PCP. Checking serum CBC today. Likely will need lifelong anticoagulation. Referring to hematology for further workup and evaluation. 2. Abdominal/pelvic lymphadenopathy: Checking serum CBC with differential today & LDH. Referring to hematology for further workup. 3. Follow-up: Return to clinic in 3 months or sooner if needed.  Sonia Baller Ashok Cordia, M.D. [facility] Pulmonary & Critical Care Pager:  [phone] After 3pm or if no response, call [phone] 9:48 AM 08/08/16

## 2016-08-08 NOTE — Addendum Note (Signed)
Addended by: Tyson Dense on: 08/08/2016 12:21 PM   Modules accepted: Orders

## 2016-08-08 NOTE — Addendum Note (Signed)
Addended by: Tyson Dense on: 08/08/2016 10:26 AM   Modules accepted: Orders

## 2016-08-08 NOTE — Patient Instructions (Addendum)
   Call your primary care physician for a refill on your Coumadin.  You will likely need to be on blood thinners lifelong but will need to be evaluated by a Hematologist (blood/bleeding specialist).  Call me if you have any new breathing problems or questions before your next appointment.  TESTS ORDERED: 1. CBC with differential & LDH today 2. 6MWT on room air at next appointment

## 2016-08-08 NOTE — Telephone Encounter (Signed)
Please have her return for bloodwork.  She needs her ptinr rechecked.  ALSO, in the last note, I asked for the coumadin clinic to be contacted (anti-coagulation clinic).  I sent a referral for this a while ago.  A cardiologist took the appointment, which they never obtained her pt/inr.  I need feedback on this asap.

## 2016-08-09 LAB — LACTATE DEHYDROGENASE: LDH: 264 U/L — ABNORMAL HIGH (ref 100–200)

## 2016-08-09 NOTE — Telephone Encounter (Signed)
Left message to contact office with DME company name.

## 2016-08-09 NOTE — Telephone Encounter (Signed)
[place] coumadin clinic on [facility]. They said they would call back today or tomorrow concerning referral and scheduling.

## 2016-08-09 NOTE — Telephone Encounter (Signed)
Please  See cardiologist referral for coumadin clinic and f/u with pt. She is coming in today for INR in meantime

## 2016-08-10 ENCOUNTER — Other Ambulatory Visit: Payer: Self-pay | Admitting: Physician Assistant

## 2016-08-10 ENCOUNTER — Encounter: Payer: BLUE CROSS/BLUE SHIELD | Admitting: Physician Assistant

## 2016-08-10 DIAGNOSIS — Z7901 Long term (current) use of anticoagulants: Secondary | ICD-10-CM

## 2016-08-10 NOTE — Telephone Encounter (Signed)
Alexa [name]---pt uses [facility]---can I just sent the order to have the oxygen d/c.  I didn't see where she is on oxygen.  thanks

## 2016-08-10 NOTE — Telephone Encounter (Signed)
[phone] pt calling back she uses [facility]

## 2016-08-12 NOTE — Telephone Encounter (Signed)
They will call us or the patient?  I am going to send this back to you.  This is for them to monitor her coumadin which is a weekly or shorter routine lab work.  It has to be done.  If they do not contact back, please follow up.

## 2016-08-13 ENCOUNTER — Telehealth: Payer: Self-pay | Admitting: Physician Assistant

## 2016-08-13 ENCOUNTER — Other Ambulatory Visit: Payer: Self-pay | Admitting: Pulmonary Disease

## 2016-08-13 DIAGNOSIS — R0902 Hypoxemia: Secondary | ICD-10-CM

## 2016-08-13 NOTE — Telephone Encounter (Signed)
Message was received stating patient's DME company. Order was placed to discontinue oxygen. Nothing further is needed.

## 2016-08-13 NOTE — Telephone Encounter (Signed)
Coumadin clinic called and pt is scheduled for 08/17/16. If needed, clinic said pt can be seen sooner and to let them know if we wanted to move appt up. If so, I will be happy to call them and change this. Thanks!

## 2016-08-13 NOTE — Telephone Encounter (Signed)
Can you check the pt/inr.  Should it be back already?

## 2016-08-14 ENCOUNTER — Other Ambulatory Visit: Payer: Self-pay | Admitting: Physician Assistant

## 2016-08-14 ENCOUNTER — Ambulatory Visit: Payer: BLUE CROSS/BLUE SHIELD | Admitting: Cardiovascular Disease

## 2016-08-14 DIAGNOSIS — Z7901 Long term (current) use of anticoagulants: Secondary | ICD-10-CM

## 2016-08-14 DIAGNOSIS — I2699 Other pulmonary embolism without acute cor pulmonale: Secondary | ICD-10-CM

## 2016-08-14 LAB — PROTIME-INR
INR: 3 — ABNORMAL HIGH (ref 0.8–1.2)
PROTHROMBIN TIME: 29.4 s — AB (ref 9.1–12.0)

## 2016-08-14 LAB — SPECIMEN STATUS REPORT

## 2016-08-15 ENCOUNTER — Encounter: Payer: Self-pay | Admitting: Surgery

## 2016-08-15 ENCOUNTER — Telehealth: Payer: Self-pay | Admitting: *Deleted

## 2016-08-15 NOTE — Telephone Encounter (Signed)
Faxed signed order ATTN: Ruthe Mannan.

## 2016-08-16 NOTE — Telephone Encounter (Signed)
Scheduled at coumadin clinic 08/17/16

## 2016-08-22 ENCOUNTER — Ambulatory Visit
Admission: RE | Admit: 2016-08-22 | Discharge: 2016-08-22 | Disposition: A | Discharge status: Home / Self Care | Payer: BLUE CROSS/BLUE SHIELD | Source: Ambulatory Visit | Attending: Vascular Surgery | Admitting: Vascular Surgery

## 2016-08-22 ENCOUNTER — Other Ambulatory Visit: Payer: Self-pay | Admitting: Physician Assistant

## 2016-08-22 DIAGNOSIS — R609 Edema, unspecified: Secondary | ICD-10-CM

## 2016-08-22 DIAGNOSIS — I2699 Other pulmonary embolism without acute cor pulmonale: Secondary | ICD-10-CM

## 2016-08-22 DIAGNOSIS — I7410 Embolism and thrombosis of unspecified parts of aorta: Secondary | ICD-10-CM

## 2016-08-22 MED ORDER — IOPAMIDOL (ISOVUE-370) INJECTION 76%
125.0000 mL | Freq: Once | INTRAVENOUS | Status: AC | PRN
  Administered 2016-08-22: 125 mL via INTRAVENOUS

## 2016-08-27 ENCOUNTER — Encounter: Payer: Self-pay | Admitting: Surgery

## 2016-08-27 ENCOUNTER — Ambulatory Visit (INDEPENDENT_AMBULATORY_CARE_PROVIDER_SITE_OTHER): Payer: Self-pay | Admitting: Surgery

## 2016-08-27 VITALS — BP 127/81 | HR 93 | Temp 97.6°F | Resp 18 | Ht 64.0 in | Wt >= 6400 oz

## 2016-08-27 DIAGNOSIS — I7410 Embolism and thrombosis of unspecified parts of aorta: Secondary | ICD-10-CM

## 2016-08-27 NOTE — Progress Notes (Signed)
[facility]  Patient name: Alexa [name] MRN: [id number] DOB: [birth year] Sex: female   REQUESTING PROVIDER:    Dr. Carles Collet   REASON FOR CONSULT:    Aortic thrombus  HISTORY OF PRESENT ILLNESS:   Alexa [name] is a 41 y.o. female, who Presented to the hospital in February 2010 with a 5 day history of shortness of breath.  Her d-dimer results were elevated.  She was hypoxic with oxygen saturation of 70% on room air.  A CT scan showed a pulmonary embolism in the distal right pulmonary artery and segmental emboli in the right upper lobe and lower segmental arteries.  Incidentally found was thrombus within the descending thoracic aorta with possible splenic infarct.  She was started on anticoagulation.  Venous duplex of both lower extremities frank conclusive secondary to the patient's body habitus.   Patient suffers from type 2 diabetes.  She is medically managed for benign essential hypertension.  She is morbidly obese with BMI greater than 70.  She is a former smoker.  She is on Coumadin.She was discharged from the hospital on home oxygen.  This was discontinued approximately one week ago.  PAST MEDICAL HISTORY    Past Medical History:  Diagnosis Date  . Gestational diabetes   . Hypertension      FAMILY HISTORY   Family History  Problem Relation Age of Onset  . Diabetes Maternal Grandmother   . Hypertension Mother     SOCIAL HISTORY:   Social History   Social History  . Marital status: Divorced    Spouse name: n/a  . Number of children: 0  . Years of education: N/A   Occupational History  . [employer]     Daycare   Social History Main Topics  . Smoking status: Former Smoker    Types: Cigarettes  . Smokeless tobacco: Never Used     Comment: only smokes when she drinks alcohol - 1 pack per week  . Alcohol use 2.4 - 3.6 oz/week    4 - 6 Standard drinks or equivalent per week   Comment: 13-4 times/weeks  . Drug use: No  . Sexual activity: Not on file   Other Topics Concern  . Not on file   Social History Narrative   Her children live with her.   Ex-husband lives locally.    ALLERGIES:    Allergies  Allergen Reactions  . Penicillins Hives    Has patient had a PCN reaction causing immediate rash, facial/tongue/throat swelling, SOB or lightheadedness with hypotension: No Has patient had a PCN reaction causing severe rash involving mucus membranes or skin necrosis: No Has patient had a PCN reaction that required hospitalization No Has patient had a PCN reaction occurring within the last 10 years: No If all of the above answers are "NO", then may proceed with Cephalosporin use.    CURRENT MEDICATIONS:    Current Outpatient Prescriptions  Medication Sig Dispense Refill  . ferrous sulfate 325 (65 FE) MG tablet Take 325 mg by mouth daily with breakfast.    . furosemide (LASIX) 40 MG tablet take 1 tablet by mouth once daily 30 tablet 0  . glucose blood test strip ascensia  Meter/strips preferred Test daily Use as instructed Dx.dm 100 each 3  . Lancets (ONETOUCH ULTRASOFT) lancets Test daily Use as instructed dm2 100 each 12  . losartan (COZAAR) 100 MG tablet Take 1 tablet (100 mg total) by mouth daily. 30 tablet 2  .  metFORMIN (GLUCOPHAGE) 500 MG tablet Take 1 tablet (500 mg total) by mouth 2 (two) times daily with a meal. 180 tablet 3  . nystatin (MYCOSTATIN/NYSTOP) powder Apply topically 4 (four) times daily. 60 g 1  . warfarin (COUMADIN) 7.5 MG tablet take 2 tablets by mouth daily 60 tablet 1   No current facility-administered medications for this visit.     REVIEW OF SYSTEMS:   [X]  denotes positive finding, [ ]  denotes negative finding Cardiac  Comments:  Chest pain or chest pressure:    Shortness of breath upon exertion:    Short of breath when lying flat:    Irregular heart rhythm:        Vascular    Pain in calf, thigh, or hip brought on by  ambulation: x   Pain in feet at night that wakes you up from your sleep:     Blood clot in your veins: x   Leg swelling:  x       Pulmonary    Oxygen at home: x   Productive cough:     Wheezing:         Neurologic    Sudden weakness in arms or legs:     Sudden numbness in arms or legs:     Sudden onset of difficulty speaking or slurred speech:    Temporary loss of vision in one eye:     Problems with dizziness:         Gastrointestinal    Blood in stool:      Vomited blood:         Genitourinary    Burning when urinating:     Blood in urine:        Psychiatric    Major depression:         Hematologic    Bleeding problems:    Problems with blood clotting too easily:        Skin    Rashes or ulcers:        Constitutional    Fever or chills:     PHYSICAL EXAM:   Vitals:   08/27/16 1345  BP: 127/81  Pulse: 93  Resp: 18  Temp: 97.6 F (36.4 C)  TempSrc: Oral  SpO2: 96%  Weight: (!) 420 lb (190.5 kg)  Height: 5\' 4"  (1.626 m)    GENERAL: The patient is a well-nourished female, in no acute distress. The vital signs are documented above. CARDIAC: There is a regular rate and rhythm.  VASCULAR: bilateral edema PULMONARY: Nonlabored respirations.  MUSCULOSKELETAL: There are no major deformities or cyanosis. NEUROLOGIC: No focal weakness or paresthesias are detected. SKIN: There are no ulcers or rashes noted. PSYCHIATRIC: The patient has a normal affect.  STUDIES:   I have reviewed her CT scan with the following findings: Aortic thrombus within the descending thoracic aorta and proximal abdominal aorta has resolved. There may be minimal residual mural thrombus but no significant atherosclerotic disease.  Stable pelvic lymphadenopathy of unknown etiology. Findings could be associated with an indolent lymphoproliferative process. Consider hematology/oncology consultation.  Main visceral arteries are patent but limited evaluation beyond the proximal aspect  of the visceral arteries.  Limited evaluation of the iliac arteries and proximal femoral arteries due to the timing of the study.  Question splenic infarcts.  Pulmonary emboli have resolved.  ASSESSMENT and PLAN   Aortic thrombus: This appears to have resulted.  No further follow-up is recommended.  The patient will be referred to hematology to help determine  the duration of anticoagulation.  In addition, consultation has been recommended from the CT scan for possible lymphoproliferative process.  I have not scheduled a follow-up appointment.  If there are any other questions regarding her care please feel free to contact me.   Annamarie Major, MD [facility] [phone] Pager [phone]

## 2016-08-28 ENCOUNTER — Other Ambulatory Visit: Payer: BLUE CROSS/BLUE SHIELD

## 2016-08-28 DIAGNOSIS — Z7901 Long term (current) use of anticoagulants: Secondary | ICD-10-CM

## 2016-08-28 DIAGNOSIS — Z5181 Encounter for therapeutic drug level monitoring: Secondary | ICD-10-CM | POA: Diagnosis not present

## 2016-08-28 NOTE — Progress Notes (Signed)
Pt returned today for PT/ INR test. Advised, we will draw bloodwork today but she will need to follow coumadin clinic for anti-coagulation therapy.  Verbalized she had to cancel appointment scheduled 5/25.

## 2016-08-29 LAB — PROTIME-INR
INR: 3.1 — AB (ref 0.8–1.2)
Prothrombin Time: 30.7 s — ABNORMAL HIGH (ref 9.1–12.0)

## 2016-08-30 ENCOUNTER — Other Ambulatory Visit: Payer: Self-pay | Admitting: Physician Assistant

## 2016-09-01 ENCOUNTER — Encounter: Payer: Self-pay | Admitting: Hematology

## 2016-09-01 ENCOUNTER — Telehealth: Payer: Self-pay | Admitting: Hematology

## 2016-09-01 NOTE — Telephone Encounter (Signed)
Spoke to the pt on 6/8 to schedule a hem appt. Pt has been scheduled for the pt to see Dr. Burr Medico on 7/9 at 11am. Demographics verified. Letter mailed.

## 2016-09-03 ENCOUNTER — Encounter (HOSPITAL_BASED_OUTPATIENT_CLINIC_OR_DEPARTMENT_OTHER): Payer: BLUE CROSS/BLUE SHIELD

## 2016-09-04 ENCOUNTER — Other Ambulatory Visit: Payer: Self-pay | Admitting: Physician Assistant

## 2016-09-08 ENCOUNTER — Other Ambulatory Visit: Payer: 59

## 2016-09-08 DIAGNOSIS — I2699 Other pulmonary embolism without acute cor pulmonale: Secondary | ICD-10-CM

## 2016-09-08 MED ORDER — LOSARTAN POTASSIUM 100 MG PO TABS: 100.0000 mg | ORAL_TABLET | Freq: Every day | ORAL | 0 refills | Status: DC

## 2016-09-08 NOTE — Progress Notes (Signed)
Told by Alexa [name] that pt needs refill on losartan. Bmp nml in 05/2016. BP well controlled at last 2 visits. Sent in 90d refills. Pt here for lab only visit requestin a1c. Last a1c was only 10 wks ago. Pt was due to for a repeat INR several days ago - advsied to draw INR and have pt make an appt for an a1c in the next 1-2 months.

## 2016-09-09 LAB — PROTIME-INR
INR: 5.4 — ABNORMAL HIGH (ref 0.8–1.2)
Prothrombin Time: 51.2 s — ABNORMAL HIGH (ref 9.1–12.0)

## 2016-09-10 ENCOUNTER — Other Ambulatory Visit: Payer: BLUE CROSS/BLUE SHIELD | Admitting: Family Medicine

## 2016-09-14 ENCOUNTER — Ambulatory Visit (INDEPENDENT_AMBULATORY_CARE_PROVIDER_SITE_OTHER): Payer: 59 | Admitting: Pharmacist Clinician (PhC)/ Clinical Pharmacy Specialist

## 2016-09-14 DIAGNOSIS — Z5181 Encounter for therapeutic drug level monitoring: Secondary | ICD-10-CM

## 2016-09-14 DIAGNOSIS — Z7901 Long term (current) use of anticoagulants: Secondary | ICD-10-CM

## 2016-09-14 DIAGNOSIS — I2699 Other pulmonary embolism without acute cor pulmonale: Secondary | ICD-10-CM

## 2016-09-14 LAB — POCT INR: INR: 5.8

## 2016-09-18 ENCOUNTER — Ambulatory Visit (INDEPENDENT_AMBULATORY_CARE_PROVIDER_SITE_OTHER): Payer: 59 | Admitting: Pharmacist Clinician (PhC)/ Clinical Pharmacy Specialist

## 2016-09-18 DIAGNOSIS — I2699 Other pulmonary embolism without acute cor pulmonale: Secondary | ICD-10-CM

## 2016-09-18 DIAGNOSIS — Z7901 Long term (current) use of anticoagulants: Secondary | ICD-10-CM

## 2016-09-18 DIAGNOSIS — Z5181 Encounter for therapeutic drug level monitoring: Secondary | ICD-10-CM

## 2016-09-18 LAB — POCT INR: INR: 1.6

## 2016-09-25 ENCOUNTER — Ambulatory Visit (INDEPENDENT_AMBULATORY_CARE_PROVIDER_SITE_OTHER): Payer: 59 | Admitting: Pharmacist

## 2016-09-25 DIAGNOSIS — Z7901 Long term (current) use of anticoagulants: Secondary | ICD-10-CM

## 2016-09-25 DIAGNOSIS — Z5181 Encounter for therapeutic drug level monitoring: Secondary | ICD-10-CM | POA: Diagnosis not present

## 2016-09-25 DIAGNOSIS — I2699 Other pulmonary embolism without acute cor pulmonale: Secondary | ICD-10-CM

## 2016-09-25 LAB — POCT INR: INR: 2.4

## 2016-09-28 NOTE — Progress Notes (Signed)
[facility]  Telephone:([phone]) [phone] Fax:(336) [phone]  Clinic New Consult Note   Patient Care Team: Joretta Bachelor, [place] as PCP - General (Physician Assistant) 10/01/2016  Referring physician: Joretta Bachelor, PA  CHIEF COMPLAINTS/PURPOSE OF CONSULTATION:  recurrent pulmonary embolisms  HISTORY OF PRESENTING ILLNESS:  Alexa [name] 41 y.o. female with past medical history of morbid obesity, diabetes, hypertension, who is here because of recurrent pulmonary embolisms. She was referred by her primary care physician.   She had unprovoked DVT several years ago, was previously on Xarelto for about 3 months.   She was hospitalized in February 2018 for about a week. She presented to 5 days a history of shortness breath, was found to be hypoxic on admission.She had CT scans that showed an embolus In the distal right pulmonary artery and segmental emboli in the right upper lobe in the right lower lobe segmental arteries. In addition, thrombus was noticed in the patient's descending thoracic aorta. Patient was started on heparin drip, and bridged over to Coumadin. She was discharged home with nasal cannula oxygen, but is now off oxygen. Her dyspnea has resolved. Her Coumadin has been monitored by [facility]. Denies any issues while taking Coumadin.   She denies any fever or night sweats, shortness of breath, stomach or chest pain.   She denies recent history of trauma,  long distance travel, dehydration, recent surgery, prolonged immobilization before her episode of PE. She does smoke occasionally, 1 pack a week. She had no prior history or diagnosis of cancer. Her age appropriate screening programs are up-to-date. She had prior surgeries before and never had perioperative thromboembolic events. The patient had been exposed to birth control pills or hormone replacement therapy. The patient had been pregnant before and denies history of peripartum thromboembolic event or  history of recurrent miscarriages. There is no family history of blood clots or miscarriages.  MEDICAL HISTORY:  Past Medical History:  Diagnosis Date  . Gestational diabetes   . Hypertension     SURGICAL HISTORY: Past Surgical History:  Procedure Laterality Date  . CESAREAN SECTION      SOCIAL HISTORY: Social History   Social History  . Marital status: Divorced    Spouse name: n/a  . Number of children: 0  . Years of education: N/A   Occupational History  . [employer]   Social History Main Topics  . Smoking status: Former Smoker    Types: Cigarettes  . Smokeless tobacco: Never Used     Comment: only smokes when she drinks alcohol - 1 pack per week  . Alcohol use 2.4 - 3.6 oz/week    4 - 6 Standard drinks or equivalent per week     Comment: mixed drinks 3-4 times/weeks  . Drug use: No  . Sexual activity: Not on file   Other Topics Concern  . Not on file   Social History Narrative   Her children live with her.   Ex-husband lives locally.    FAMILY HISTORY: Family History  Problem Relation Age of Onset  . Diabetes Maternal Grandmother   . Cancer Maternal Grandmother        breast cancer   . Hypertension Mother   . Cancer Maternal Aunt 55       breast cancer   . Cancer Maternal Aunt 55       breast cancer     ALLERGIES:  is allergic to penicillins.  MEDICATIONS:  Current  Outpatient Prescriptions  Medication Sig Dispense Refill  . ferrous sulfate 325 (65 FE) MG tablet Take 325 mg by mouth daily with breakfast.    . furosemide (LASIX) 40 MG tablet take 1 tablet by mouth once daily 30 tablet 0  . glucose blood test strip ascensia  Meter/strips preferred Test daily Use as instructed Dx.dm 100 each 3  . Lancets (ONETOUCH ULTRASOFT) lancets Test daily Use as instructed dm2 100 each 12  . losartan (COZAAR) 100 MG tablet Take 1 tablet (100 mg total) by mouth daily. 90 tablet 0  . metFORMIN (GLUCOPHAGE) 500 MG tablet Take 1 tablet  (500 mg total) by mouth 2 (two) times daily with a meal. 180 tablet 3  . nystatin (MYCOSTATIN/NYSTOP) powder Apply topically 4 (four) times daily. 60 g 1  . warfarin (COUMADIN) 7.5 MG tablet take 2 tablets by mouth daily (Patient taking differently: take 2 tablets by mouth daily alternating with 1 1/2 tabs daily) 60 tablet 1   No current facility-administered medications for this visit.     REVIEW OF SYSTEMS:   Constitutional: Denies fevers, chills or abnormal night sweats Eyes: Denies blurriness of vision, double vision or watery eyes Ears, nose, mouth, throat, and face: Denies mucositis or sore throat Respiratory: Denies cough, dyspnea or wheezes Cardiovascular: Denies palpitation, chest discomfort or lower extremity swelling Gastrointestinal:  Denies nausea, heartburn or change in bowel habits Skin: Denies abnormal skin rashes Lymphatics: Denies new lymphadenopathy or easy bruising Neurological:Denies numbness, tingling or new weaknesses Behavioral/Psych: Mood is stable, no new changes  All other systems were reviewed with the patient and are negative.  PHYSICAL EXAMINATION: ECOG PERFORMANCE STATUS: 1 - Symptomatic but completely ambulatory  Vitals:   10/01/16 1307  BP: (!) 151/97  Pulse: 87  Resp: 18  Temp: 98.4 F (36.9 C)   Filed Weights   10/01/16 1307  Weight: (!) 427 lb 9.6 oz (194 kg)    GENERAL:alert, no distress and comfortable, morbidly obese female  SKIN: skin color, texture, turgor are normal, no rashes or significant lesions except a large skin tag at her upper posterior neck  EYES: normal, conjunctiva are pink and non-injected, sclera clear OROPHARYNX:no exudate, no erythema and lips, buccal mucosa, and tongue normal  NECK: supple, thyroid normal size, non-tender, without nodularity LYMPH:  no palpable lymphadenopathy in the cervical, axillary or inguinal LUNGS: clear to auscultation and percussion with normal breathing effort HEART: regular rate & rhythm  and no murmurs and no lower extremity edema ABDOMEN:abdomen soft, non-tender and normal bowel sounds Musculoskeletal:no cyanosis of digits and no clubbing  PSYCH: alert & oriented x 3 with fluent speech NEURO: no focal motor/sensory deficits  LABORATORY DATA:  I have reviewed the data as listed Recent Results (from the past 2160 hour(s))  Protime-INR     Status: Abnormal   Collection Time: 07/05/16  1:46 PM  Result Value Ref Range   INR 3.0 (H) 0.8 - 1.2    Comment: Reference interval is for non-anticoagulated patients. Suggested INR therapeutic range for Vitamin K antagonist therapy:    Standard Dose (moderate intensity                   therapeutic range):       2.0 - 3.0    Higher intensity therapeutic range       2.5 - 3.5    Prothrombin Time 29.6 (H) 9.1 - 12.0 sec  Protime-INR     Status: Abnormal   Collection Time: 07/13/16  5:42 PM  Result Value Ref Range   INR 2.5 (H) 0.8 - 1.2    Comment: Reference interval is for non-anticoagulated patients. Suggested INR therapeutic range for Vitamin K antagonist therapy:    Standard Dose (moderate intensity                   therapeutic range):       2.0 - 3.0    Higher intensity therapeutic range       2.5 - 3.5    Prothrombin Time 24.8 (H) 9.1 - 12.0 sec  Protime-INR     Status: Abnormal   Collection Time: 07/26/16  2:40 PM  Result Value Ref Range   INR 2.8 (H) 0.8 - 1.2    Comment: Reference interval is for non-anticoagulated patients. Suggested INR therapeutic range for Vitamin K antagonist therapy:    Standard Dose (moderate intensity                   therapeutic range):       2.0 - 3.0    Higher intensity therapeutic range       2.5 - 3.5    Prothrombin Time 28.1 (H) 9.1 - 12.0 sec  CBC with Differential/Platelet     Status: Abnormal   Collection Time: 08/08/16 10:55 AM  Result Value Ref Range   WBC 8.1 4.0 - 10.5 K/uL   RBC 4.79 3.87 - 5.11 Mil/uL   Hemoglobin 12.0 12.0 - 15.0 g/dL   HCT 38.0 36.0 - 46.0 %    MCV 79.2 78.0 - 100.0 fl   MCHC 31.5 30.0 - 36.0 g/dL   RDW 20.6 (H) 11.5 - 15.5 %   Platelets 391.0 150.0 - 400.0 K/uL   Neutrophils Relative % 63.6 43.0 - 77.0 %   Lymphocytes Relative 23.7 12.0 - 46.0 %   Monocytes Relative 12.6 (H) 3.0 - 12.0 %   Eosinophils Relative 0.0 0.0 - 5.0 %   Basophils Relative 0.1 0.0 - 3.0 %   Neutro Abs 5.2 1.4 - 7.7 K/uL   Lymphs Abs 1.9 0.7 - 4.0 K/uL   Monocytes Absolute 1.0 0.1 - 1.0 K/uL   Eosinophils Absolute 0.0 0.0 - 0.7 K/uL   Basophils Absolute 0.0 0.0 - 0.1 K/uL  Lactate Dehydrogenase (LDH)     Status: Abnormal   Collection Time: 08/08/16 10:55 AM  Result Value Ref Range   LDH 264 (H) 100 - 200 U/L  Protime-INR     Status: Abnormal   Collection Time: 08/10/16  4:30 PM  Result Value Ref Range   INR 3.0 (H) 0.8 - 1.2    Comment: Reference interval is for non-anticoagulated patients. Suggested INR therapeutic range for Vitamin K antagonist therapy:    Standard Dose (moderate intensity                   therapeutic range):       2.0 - 3.0    Higher intensity therapeutic range       2.5 - 3.5    Prothrombin Time 29.4 (H) 9.1 - 12.0 sec  Specimen status report     Status: None   Collection Time: 08/10/16  4:30 PM  Result Value Ref Range   specimen status report Comment     Comment: Specimen Identification Status Specimen Identification Status The sample was received by the laboratory unlabeled. The 65 Clinician has authorized release of the results. The 55 Clinician agrees to assume responsibility for sample identification. AUTHORIZED BY: [name] ENGLISH ON 08/14/16  Protime-INR     Status: Abnormal   Collection Time: 08/28/16 12:38 PM  Result Value Ref Range   INR 3.1 (H) 0.8 - 1.2    Comment: Reference interval is for non-anticoagulated patients. Suggested INR therapeutic range for Vitamin K antagonist therapy:    Standard Dose (moderate intensity                   therapeutic range):       2.0 - 3.0     Higher intensity therapeutic range       2.5 - 3.5    Prothrombin Time 30.7 (H) 9.1 - 12.0 sec  Protime-INR     Status: Abnormal   Collection Time: 09/08/16  1:59 PM  Result Value Ref Range   INR 5.4 (H) 0.8 - 1.2    Comment: Reference interval is for non-anticoagulated patients. Suggested INR therapeutic range for Vitamin K antagonist therapy:    Standard Dose (moderate intensity                   therapeutic range):       2.0 - 3.0    Higher intensity therapeutic range       2.5 - 3.5    Prothrombin Time 51.2 (H) 9.1 - 12.0 sec  POCT INR     Status: None   Collection Time: 09/14/16  4:29 PM  Result Value Ref Range   INR 5.8   POCT INR     Status: None   Collection Time: 09/18/16  1:57 PM  Result Value Ref Range   INR 1.6   POCT INR     Status: None   Collection Time: 09/25/16  1:13 PM  Result Value Ref Range   INR 2.4     RADIOGRAPHIC STUDIES: 08/22/2016 CT Angio Andomen/Pelvis w & w/o contrast IMPRESSION: Aortic thrombus within the descending thoracic aorta and proximal abdominal aorta has resolved. There may be minimal residual mural thrombus but no significant atherosclerotic disease.  Stable pelvic lymphadenopathy of unknown etiology. Findings could be associated with an indolent lymphoproliferative process. Consider hematology/oncology consultation.  Main visceral arteries are patent but limited evaluation beyond the proximal aspect of the visceral arteries.  Limited evaluation of the iliac arteries and proximal femoral arteries due to the timing of the study.  Question splenic infarcts.  Pulmonary emboli have resolved. I have personally reviewed the radiological images as listed and agreed with the findings in the report. No results found.  ASSESSMENT: 41 y.o.  female with morbid obesity, diabetes, hypertension, history of unprovoked DVT, presented with unprovoked PE.  1. 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  This last episode of blood clot appeared to be unprovoked. She has had some workup to screen for thrombophilia disorder, Including lupus anticoagulant, factor V Leyden mutation, prothrombin gene mutation and is a negative. She is morbidly obese, which is a high-risk for thrombosis. Given her recurrent unprovoked DVT and PE, I recommend lifelong anticoagulation if no contraindication.  We discussed about various options of anticoagulation therapies including warfarin, low molecular weight heparin such as Lovenox or newer agents such as Rivaroxaban. Some of the risks and benefits discussed including costs involved, the need for monitoring, risks of life-threatening bleeding/hospitalization, reversibility of each agent in the event of bleeding or overdose, safety profile of each drug and taking into account other social issues such as ease of administration of  medications, etc. she is tolerating Coumadin well, we'll continue.  Hypercoagulopathy workup We discussed about the pros and cons about testing for thrombophilia disorder. her current anticoagulation therapy will interfere with some the tests and it is not possible to interpret the test results. Taking her off the anticoagulation therapy to do the tests may precipitate another thrombotic event. Se has been tested negative for prothrombin gene mutation and factor V Leiden which were not interfered by his anticoagulation. Her lupus anticoagulant was also negative.  We discussed malignancy and lymphoproliferative disorder also increase the risk of thrombosis. She had a CT scan, which showed mild adenopathy in pelvis, exam was negative for peripheral adenopathy, she does not have B symptoms, my concern for lymphoproliferative disorder is not very high. However it will be reasonable to repeat a scan in 6 months to follow her adenopathy.   Duration of anticoagulation Due to her recurrent  unprovoked DVT and PE, her morbid obesity, I recommend anticoagulation indefinitely if no contraindications.  Anticoagulation options We discussed about various options of anticoagulation therapies including warfarin, low molecular weight heparin such as Lovenox or newer agents such as Rivaroxaban. Some of the risks and benefits discussed including costs involved, the need for monitoring, risks of life-threatening bleeding/hospitalization, reversibility of each agent in the event of bleeding or overdose, safety profile of each drug and taking into account other social issues such as ease of administration of medications, etc. Ultimately, we have made an informed decision for the patient to continue her treatment with coumadin. She will continue to follow-up her Coumadin level at the heart clinic.  Other preventive strategy for DVT  I recommend the patient to use elastic compression stockings at 20-30 mmHg to reduce risks of chronic thrombophlebitis.  Finally, at the end of our consultation today, I reinforced the importance of preventive strategies such as avoiding hormonal supplement, avoiding cigarette smoking, keeping up-to-date with screening programs for early cancer detection, frequent ambulation for long distance travel and aggressive DVT prophylaxis in all surgical settings.  Family counseling Another main issue we discussed today included the role of screening other family members for thrombophilia disorder. At present time, I would not recommend testing the patient's family members as it would not benefit them.  2. HTN/DM - monitored by PCP  3. Skin Tag - I strongly encouraged her to see a dermatologist or consult with her PCP  4. Pelvic adenopathy -This was found during her workup for PE. Repeat his scan 3 months later showed stable adenopathy, overall mild. -She has no clinical B symptoms for lymphoma, no peripheral adenopathy on exam. -We'll repeat a CT scan on next visit.  Plan  and follow up: -I recommend her to continue Coumadin indefinitely  -I'll see her back in 8 months with a repeat CT scan and lab before. She will follow Coumadin clinic until then.  All questions were answered. The patient knows to call the clinic with any problems, questions or concerns. I spent 40 minutes counseling the patient face to face. The total time spent in the appointment was 45 minutes and more than 50% was on counseling.     Truitt Merle, MD 10/01/2016 11:44 PM

## 2016-10-01 ENCOUNTER — Telehealth: Payer: Self-pay | Admitting: Hematology

## 2016-10-01 ENCOUNTER — Encounter: Payer: Self-pay | Admitting: Hematology

## 2016-10-01 ENCOUNTER — Ambulatory Visit (HOSPITAL_BASED_OUTPATIENT_CLINIC_OR_DEPARTMENT_OTHER): Payer: 59 | Admitting: Hematology

## 2016-10-01 ENCOUNTER — Other Ambulatory Visit: Payer: Self-pay | Admitting: Physician Assistant

## 2016-10-01 ENCOUNTER — Telehealth: Payer: Self-pay | Admitting: Cardiovascular Disease

## 2016-10-01 DIAGNOSIS — Z86718 Personal history of other venous thrombosis and embolism: Secondary | ICD-10-CM

## 2016-10-01 DIAGNOSIS — I2699 Other pulmonary embolism without acute cor pulmonale: Secondary | ICD-10-CM | POA: Diagnosis not present

## 2016-10-01 DIAGNOSIS — I7411 Embolism and thrombosis of thoracic aorta: Secondary | ICD-10-CM | POA: Diagnosis not present

## 2016-10-01 DIAGNOSIS — E119 Type 2 diabetes mellitus without complications: Secondary | ICD-10-CM | POA: Diagnosis not present

## 2016-10-01 DIAGNOSIS — Z7901 Long term (current) use of anticoagulants: Secondary | ICD-10-CM | POA: Diagnosis not present

## 2016-10-01 DIAGNOSIS — R590 Localized enlarged lymph nodes: Secondary | ICD-10-CM

## 2016-10-01 DIAGNOSIS — I10 Essential (primary) hypertension: Secondary | ICD-10-CM

## 2016-10-01 DIAGNOSIS — R609 Edema, unspecified: Secondary | ICD-10-CM

## 2016-10-01 NOTE — Telephone Encounter (Signed)
Patient left a VM on my phone stating that her sleep study is 10-10-16 and she needs to reschedule her followup with Dr. Claiborne Billings until after 10-10-16.  Routing this phone call to Mariann Laster to let me know where this patient can be rescheduled.

## 2016-10-01 NOTE — Telephone Encounter (Signed)
Scheduled appt per 10/01/16 los - lab and f/u in 8 months. [facility] to contact patient with Ct schedule.

## 2016-10-02 ENCOUNTER — Ambulatory Visit (INDEPENDENT_AMBULATORY_CARE_PROVIDER_SITE_OTHER): Payer: 59 | Admitting: Pharmacist

## 2016-10-02 ENCOUNTER — Telehealth: Payer: Self-pay | Admitting: *Deleted

## 2016-10-02 DIAGNOSIS — Z5181 Encounter for therapeutic drug level monitoring: Secondary | ICD-10-CM | POA: Diagnosis not present

## 2016-10-02 DIAGNOSIS — I2699 Other pulmonary embolism without acute cor pulmonale: Secondary | ICD-10-CM | POA: Diagnosis not present

## 2016-10-02 DIAGNOSIS — Z7901 Long term (current) use of anticoagulants: Secondary | ICD-10-CM

## 2016-10-02 LAB — POCT INR: INR: 2.3

## 2016-10-02 NOTE — Telephone Encounter (Signed)
Rx refill for Furosemide 40 mg #30 taken 1 tablet daily. 0 refills.

## 2016-10-05 ENCOUNTER — Ambulatory Visit: Payer: 59 | Admitting: Cardiovascular Disease

## 2016-10-06 ENCOUNTER — Encounter: Payer: Self-pay | Admitting: Hematology

## 2016-10-06 DIAGNOSIS — R590 Localized enlarged lymph nodes: Secondary | ICD-10-CM | POA: Insufficient documentation

## 2016-10-06 DIAGNOSIS — Z86718 Personal history of other venous thrombosis and embolism: Secondary | ICD-10-CM | POA: Insufficient documentation

## 2016-10-09 ENCOUNTER — Ambulatory Visit (INDEPENDENT_AMBULATORY_CARE_PROVIDER_SITE_OTHER): Payer: 59 | Admitting: Pharmacist

## 2016-10-09 DIAGNOSIS — Z5181 Encounter for therapeutic drug level monitoring: Secondary | ICD-10-CM

## 2016-10-09 DIAGNOSIS — I2699 Other pulmonary embolism without acute cor pulmonale: Secondary | ICD-10-CM | POA: Diagnosis not present

## 2016-10-09 DIAGNOSIS — Z7901 Long term (current) use of anticoagulants: Secondary | ICD-10-CM

## 2016-10-09 LAB — POCT INR: INR: 2.8

## 2016-10-10 ENCOUNTER — Ambulatory Visit (HOSPITAL_BASED_OUTPATIENT_CLINIC_OR_DEPARTMENT_OTHER): Payer: 59 | Attending: Cardiovascular Disease | Admitting: Cardiovascular Disease

## 2016-10-10 VITALS — Ht 65.0 in | Wt >= 6400 oz

## 2016-10-10 DIAGNOSIS — G4733 Obstructive sleep apnea (adult) (pediatric): Secondary | ICD-10-CM | POA: Insufficient documentation

## 2016-10-10 DIAGNOSIS — Z79899 Other long term (current) drug therapy: Secondary | ICD-10-CM | POA: Diagnosis not present

## 2016-10-10 DIAGNOSIS — G4710 Hypersomnia, unspecified: Secondary | ICD-10-CM | POA: Diagnosis not present

## 2016-10-10 DIAGNOSIS — Z7984 Long term (current) use of oral hypoglycemic drugs: Secondary | ICD-10-CM | POA: Insufficient documentation

## 2016-10-10 DIAGNOSIS — Z7901 Long term (current) use of anticoagulants: Secondary | ICD-10-CM | POA: Insufficient documentation

## 2016-10-10 DIAGNOSIS — R0683 Snoring: Secondary | ICD-10-CM

## 2016-10-10 DIAGNOSIS — R400 Somnolence: Secondary | ICD-10-CM

## 2016-10-10 DIAGNOSIS — G4736 Sleep related hypoventilation in conditions classified elsewhere: Secondary | ICD-10-CM | POA: Insufficient documentation

## 2016-10-10 DIAGNOSIS — R0902 Hypoxemia: Secondary | ICD-10-CM

## 2016-10-11 ENCOUNTER — Telehealth: Payer: Self-pay | Admitting: Physician Assistant

## 2016-10-11 NOTE — Telephone Encounter (Signed)
LVM to call us back. Per the [employer], it is not recommended that she donate blood.

## 2016-10-11 NOTE — Telephone Encounter (Signed)
PATIENT WOULD LIKE TO ASK [name] IF SHE CAN GIVE BLOOD IF SHE IS ON A BLOOD THINNER? HER [facility] IS HAVING A BLOOD DRAW TOMORROW Friday (10/12/16). BEST PHONE [phone] (CELL) [place]

## 2016-10-12 NOTE — Telephone Encounter (Signed)
Follow up msg left on pts voicemail regarding [facility].

## 2016-10-18 ENCOUNTER — Other Ambulatory Visit: Payer: Self-pay | Admitting: Physician Assistant

## 2016-10-18 DIAGNOSIS — Z7901 Long term (current) use of anticoagulants: Secondary | ICD-10-CM

## 2016-10-18 DIAGNOSIS — I2699 Other pulmonary embolism without acute cor pulmonale: Secondary | ICD-10-CM

## 2016-10-18 NOTE — Telephone Encounter (Signed)
This will be okay. As of today her sleep study has not been read. It is still in Dr Evette Georges in basket. She can be scheduled for the [facility] sleep clinic

## 2016-10-19 NOTE — Telephone Encounter (Signed)
Are you following this pt with Coumadin or another clinic? I see that her INR was done by another clinic. [facility] is requesting refill Please advise

## 2016-10-19 NOTE — Telephone Encounter (Signed)
Please contact patient and ask if she is getting her medication prescribed from the coumadin clinic, because she should.  However she should not go without taking this, so please confirm and let me know  This was signed.  Contacted [facility] and cancelled.  At this point, not acquainted to her dosing, and she is followed by anti-coagulant clinic.

## 2016-10-23 ENCOUNTER — Ambulatory Visit (INDEPENDENT_AMBULATORY_CARE_PROVIDER_SITE_OTHER): Payer: 59 | Admitting: Pharmacist

## 2016-10-23 DIAGNOSIS — I2699 Other pulmonary embolism without acute cor pulmonale: Secondary | ICD-10-CM

## 2016-10-23 DIAGNOSIS — Z7901 Long term (current) use of anticoagulants: Secondary | ICD-10-CM

## 2016-10-23 DIAGNOSIS — Z5181 Encounter for therapeutic drug level monitoring: Secondary | ICD-10-CM

## 2016-10-23 LAB — POCT INR: INR: 3.2

## 2016-10-24 ENCOUNTER — Encounter: Payer: Self-pay | Admitting: Physician Assistant

## 2016-10-24 ENCOUNTER — Ambulatory Visit (INDEPENDENT_AMBULATORY_CARE_PROVIDER_SITE_OTHER): Payer: 59 | Admitting: Physician Assistant

## 2016-10-24 VITALS — BP 147/86 | HR 105 | Temp 98.6°F | Resp 16 | Ht 64.17 in | Wt >= 6400 oz

## 2016-10-24 DIAGNOSIS — R202 Paresthesia of skin: Secondary | ICD-10-CM

## 2016-10-24 DIAGNOSIS — E118 Type 2 diabetes mellitus with unspecified complications: Secondary | ICD-10-CM | POA: Diagnosis not present

## 2016-10-24 LAB — CMP14+EGFR
A/G RATIO: 1.2 (ref 1.2–2.2)
ALBUMIN: 4.1 g/dL (ref 3.5–5.5)
ALK PHOS: 64 IU/L (ref 39–117)
ALT: 14 IU/L (ref 0–32)
AST: 20 IU/L (ref 0–40)
BILIRUBIN TOTAL: 0.3 mg/dL (ref 0.0–1.2)
BUN / CREAT RATIO: 12 (ref 9–23)
BUN: 10 mg/dL (ref 6–20)
CHLORIDE: 100 mmol/L (ref 96–106)
CO2: 25 mmol/L (ref 20–29)
Calcium: 9 mg/dL (ref 8.7–10.2)
Creatinine, Ser: 0.85 mg/dL (ref 0.57–1.00)
GFR calc Af Amer: 101 mL/min/{1.73_m2} (ref >59)
GFR calc non Af Amer: 87 mL/min/{1.73_m2} (ref >59)
GLOBULIN, TOTAL: 3.3 g/dL (ref 1.5–4.5)
Glucose: 84 mg/dL (ref 65–99)
POTASSIUM: 4.1 mmol/L (ref 3.5–5.2)
SODIUM: 140 mmol/L (ref 134–144)
Total Protein: 7.4 g/dL (ref 6.0–8.5)

## 2016-10-24 LAB — HEMOGLOBIN A1C
Est. average glucose Bld gHb Est-mCnc: 117 mg/dL
HEMOGLOBIN A1C: 5.7 % — AB (ref 4.8–5.6)

## 2016-10-24 MED ORDER — GABAPENTIN 300 MG PO CAPS: 300.0000 mg | ORAL_CAPSULE | Freq: Two times a day (BID) | ORAL | 1 refills | Status: DC

## 2016-10-24 NOTE — Progress Notes (Signed)
PRIMARY CARE AT [facility] [address] [place] [address] [phone]  Date:  10/24/2016   Name:  Alexa [name]   DOB:  17-Sep-1975   MRN:  [id number]  PCP:  Joretta Bachelor, PA    History of Present Illness:  Alexa [name] is a 41 y.o. female patient who presents to PCP with  Chief Complaint  Patient presents with  . Diabetes    follow-up     Patient reports that her diabetes has been ok.  She has had symptoms of worsening tingling under her feet at work.  There is noticeable increase in her weight.  She has continued lower extremity swelling that is worsening with heavy ambulation.  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  Denies worsening sob, or dyspnea.  No chest pains or palpitations.  She is taking the lasix 21m.  She states that her diet has changed.  There is a 30lb weight gain over the last 3 months.  She notes that when she was hospitalized, her diet was restricted, and she had a low sodium diet. Her food consist of take out chinese (sesame chicken, sweet teas).  She does attempt to bake at home. She is followed by sleepstudy.  Sleep test last week, pending results. Pulmonary in 3 weeks Coumadin managed by coumadin clinic q2 weeks, therapeutic.  Wt Readings from Last 3 Encounters:  10/24/16 (!) 430 lb (195 kg)  10/16/16 (!) 420 lb (190.5 kg)  10/01/16 (!) 427 lb 9.6 oz (194 kg)      Patient Active Problem List   Diagnosis Date Noted  . Personal history of venous thrombosis and embolism 10/06/2016  . Pelvic lymphadenopathy 10/06/2016  . Adenopathy 08/08/2016  . Aortic thrombus (HE[place] 06/19/2016  . Chronic anticoagulation 06/19/2016  . Diastolic dysfunction 059/30/1237 . HCD (hypertensive cardiovascular disease) 06/19/2016  . Anticoagulated on Coumadin 06/04/2016  . Other acute pulmonary embolism without acute cor pulmonale (HS[place] 06/04/2016  . Acute pulmonary embolus ([place] 05/14/2016  . Acute respiratory failure with hypoxia ([place] 05/14/2016  . Hypoxia    . Class 3 obesity due to excess calories with serious comorbidity and body mass index (BMI) greater than or equal to 70 in adult (HD[place] 04/15/2016  . Athlete's foot 10/08/2015  . 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 Benign essential HTN 06/22/2015  . 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 BMI 70 and over, adult (H[place] 06/22/2015  . Vitamin D deficiency 06/22/2015  . Iron deficiency anemia 06/22/2015    Past Medical History:  Diagnosis Date  . Gestational diabetes   . Hypertension     Past Surgical History:  Procedure Laterality Date  . CESAREAN SECTION      Social History  Substance Use Topics  . Smoking status: Former Smoker    Types: Cigarettes  . Smokeless tobacco: Never Used     Comment: only smokes when she drinks alcohol - 1 pack per week  . Alcohol use 2.4 - 3.6 oz/week    4 - 6 Standard drinks or equivalent per week     Comment: mixed drinks 3-4 times/weeks    Family History  Problem Relation Age of Onset  . Diabetes Maternal Grandmother   . Cancer Maternal Grandmother        breast cancer   . Hypertension Mother   . Cancer Maternal Aunt 55       breast cancer   . Cancer Maternal Aunt 579  breast cancer     Allergies  Allergen Reactions  . Penicillins Hives    Has patient had a PCN reaction causing immediate rash, facial/tongue/throat swelling, SOB or lightheadedness with hypotension: No Has patient had a PCN reaction causing severe rash involving mucus membranes or skin necrosis: No Has patient had a PCN reaction that required hospitalization No Has patient had a PCN reaction occurring within the last 10 years: No If all of the above answers are "NO", then may proceed with Cephalosporin use.    Medication list has been reviewed and updated.  Current Outpatient Prescriptions on File Prior to Visit  Medication Sig Dispense Refill  . ferrous sulfate 325 (65 FE) MG tablet Take 325 mg by  mouth daily with breakfast.    . furosemide (LASIX) 40 MG tablet take 1 tablet by mouth once daily 30 tablet 0  . glucose blood test strip ascensia  Meter/strips preferred Test daily Use as instructed Dx.dm 100 each 3  . Lancets (ONETOUCH ULTRASOFT) lancets Test daily Use as instructed dm2 100 each 12  . losartan (COZAAR) 100 MG tablet Take 1 tablet (100 mg total) by mouth daily. 90 tablet 0  . metFORMIN (GLUCOPHAGE) 500 MG tablet Take 1 tablet (500 mg total) by mouth 2 (two) times daily with a meal. 180 tablet 3  . nystatin (MYCOSTATIN/NYSTOP) powder Apply topically 4 (four) times daily. 60 g 1  . warfarin (COUMADIN) 7.5 MG tablet take 2 tablets by mouth once daily 60 tablet 1   No current facility-administered medications on file prior to visit.     ROS ROS otherwise unremarkable unless listed above.  Physical Examination: BP (!) 152/77   Pulse (!) 105   Temp 98.6 F (37 C) (Oral)   Resp 16   Ht 5' 4.17" (1.63 m)   Wt (!) 430 lb (195 kg)   LMP 09/09/2016 (Approximate)   SpO2 93%   BMI 73.41 kg/m  Ideal Body Weight: Weight in (lb) to have BMI = 25: 146.1  Physical Exam  Constitutional: She is oriented to person, place, and time. She appears well-developed and well-nourished. No distress.  HENT:  Head: Normocephalic and atraumatic.  Right Ear: External ear normal.  Left Ear: External ear normal.  Eyes: Pupils are equal, round, and reactive to light. Conjunctivae and EOM are normal.  Cardiovascular: Regular rhythm.  Tachycardia present.   Pulses:      Dorsalis pedis pulses are 1+ on the right side, and 1+ on the left side.  Mild tachycardia 1+ pitting edema at lower extremity with dry    Pulmonary/Chest: Effort normal. No apnea. No respiratory distress. She has no decreased breath sounds. She has no wheezes. She has no rhonchi.  Neurological: She is alert and oriented to person, place, and time.  Skin: She is not diaphoretic.  Psychiatric: She has a normal mood and affect.  Her behavior is normal.     Assessment and Plan: Alexa [name] is a 41 y.o. female who is here today for cc of diabetes follow up a1c repeated today.   Will refer to nutritionist--she would like this.  She is also requesting information on physical training. Decrease sweet teas.  She will log diet daily with my fitnesspal at this time, that she can submit to her nutritionist.   Type 2 diabetes mellitus with complication, without long-term current use of insulin ([place]) - Plan: CMP14+EGFR, Hemoglobin A1c  Ivar Drape, PA-C [facility] 8/1/201810:03 AM

## 2016-10-24 NOTE — Patient Instructions (Addendum)
Please drop the iced teas as we discussed.  Replace with water.   I am referring you to a nutritionist.   In the meantime, I want you to look at my fitnesspal app.  Log the foods you are eating.  Enter the goals, and it will tell you how many calories you are going to eat and a stopping place.  This will be good to submit to your nutritionist, if accurate.       IF you received an x-ray today, you will receive an invoice from [facility]. Please contact [facility] at [phone] with questions or concerns regarding your invoice.   IF you received labwork today, you will receive an invoice from [facility]. Please contact [facility] at [phone] with questions or concerns regarding your invoice.   Our billing staff will not be able to assist you with questions regarding bills from these companies.  You will be contacted with the lab results as soon as they are available. The fastest way to get your results is to activate your My Chart account. Instructions are located on the last page of this paperwork. If you have not heard from [place] regarding the results in 2 weeks, please contact this office.

## 2016-10-28 ENCOUNTER — Other Ambulatory Visit: Payer: Self-pay | Admitting: Physician Assistant

## 2016-10-28 DIAGNOSIS — I2699 Other pulmonary embolism without acute cor pulmonale: Secondary | ICD-10-CM

## 2016-10-28 DIAGNOSIS — Z7901 Long term (current) use of anticoagulants: Secondary | ICD-10-CM

## 2016-10-30 ENCOUNTER — Telehealth: Payer: Self-pay | Admitting: Cardiovascular Disease

## 2016-10-30 NOTE — Telephone Encounter (Signed)
New message   Pt is calling because no one called her about her sleep study results from the 18th of July and she also sees in My Chart that there is another sleep study scheduled. She has questions and concerns about it.

## 2016-10-30 NOTE — Telephone Encounter (Signed)
Returned call, no answer, left detailed message (ok per DPR).  Advised sleep study needs to be read and there is a upcoming appointment in October with Dr. Claiborne Billings that she may be seeing on MyChart.  Advised once read, will call to make her aware.  Advised to call with further questions or concerns.

## 2016-10-30 NOTE — Telephone Encounter (Signed)
FORWARD TO HAYLEY RN 

## 2016-11-02 ENCOUNTER — Telehealth: Payer: Self-pay | Admitting: Cardiovascular Disease

## 2016-11-02 ENCOUNTER — Telehealth: Payer: Self-pay | Admitting: Physician Assistant

## 2016-11-02 ENCOUNTER — Other Ambulatory Visit: Payer: Self-pay | Admitting: Emergency Medicine

## 2016-11-02 DIAGNOSIS — I2699 Other pulmonary embolism without acute cor pulmonale: Secondary | ICD-10-CM

## 2016-11-02 DIAGNOSIS — Z7901 Long term (current) use of anticoagulants: Secondary | ICD-10-CM

## 2016-11-02 MED ORDER — WARFARIN SODIUM 7.5 MG PO TABS: 15.0000 mg | ORAL_TABLET | Freq: Every day | ORAL | 1 refills | Status: DC

## 2016-11-02 NOTE — Telephone Encounter (Signed)
New message  Pt call requesting to speak with coumadin. Pt states she has not had her coumadin since Saturday. Pt would like to discuss her proper instructions of restarting her coumadin. Please call back to discuss

## 2016-11-02 NOTE — Telephone Encounter (Signed)
LMOM; patient instructed to call back to discuss reason for missed warfarin doses.  Instructed to resume at 2 tablets today then, resume 2 tablets each Tuesday, Thursday and Saturday, 1.5 tablets all other days.

## 2016-11-02 NOTE — Telephone Encounter (Signed)
Left message medication refilled and sent to pharmacy

## 2016-11-02 NOTE — Telephone Encounter (Signed)
PT IS CHECKING ON STATUS OF HER BLOOD [place] MED[place] SHE STATES SHE HAS BEEN OUT FOR TWO DAYS  BEST NUMBER [id number]

## 2016-11-06 ENCOUNTER — Ambulatory Visit (INDEPENDENT_AMBULATORY_CARE_PROVIDER_SITE_OTHER): Payer: 59 | Admitting: Pharmacist Clinician (PhC)/ Clinical Pharmacy Specialist

## 2016-11-06 DIAGNOSIS — Z5181 Encounter for therapeutic drug level monitoring: Secondary | ICD-10-CM | POA: Diagnosis not present

## 2016-11-06 DIAGNOSIS — Z7901 Long term (current) use of anticoagulants: Secondary | ICD-10-CM | POA: Diagnosis not present

## 2016-11-06 DIAGNOSIS — I2699 Other pulmonary embolism without acute cor pulmonale: Secondary | ICD-10-CM

## 2016-11-06 LAB — POCT INR: INR: 1.2

## 2016-11-13 ENCOUNTER — Ambulatory Visit (INDEPENDENT_AMBULATORY_CARE_PROVIDER_SITE_OTHER): Payer: 59 | Admitting: Pharmacist

## 2016-11-13 DIAGNOSIS — Z7901 Long term (current) use of anticoagulants: Secondary | ICD-10-CM | POA: Diagnosis not present

## 2016-11-13 DIAGNOSIS — Z5181 Encounter for therapeutic drug level monitoring: Secondary | ICD-10-CM

## 2016-11-13 DIAGNOSIS — I2699 Other pulmonary embolism without acute cor pulmonale: Secondary | ICD-10-CM

## 2016-11-13 LAB — POCT INR: INR: 2.6

## 2016-11-15 ENCOUNTER — Encounter: Payer: Self-pay | Admitting: Pulmonary Disease

## 2016-11-15 ENCOUNTER — Ambulatory Visit (INDEPENDENT_AMBULATORY_CARE_PROVIDER_SITE_OTHER): Payer: 59 | Admitting: Pulmonary Disease

## 2016-11-15 ENCOUNTER — Ambulatory Visit (INDEPENDENT_AMBULATORY_CARE_PROVIDER_SITE_OTHER): Payer: 59 | Admitting: *Deleted

## 2016-11-15 VITALS — BP 134/76 | HR 104 | Ht 64.0 in | Wt >= 6400 oz

## 2016-11-15 DIAGNOSIS — R0609 Other forms of dyspnea: Secondary | ICD-10-CM | POA: Diagnosis not present

## 2016-11-15 DIAGNOSIS — I2699 Other pulmonary embolism without acute cor pulmonale: Secondary | ICD-10-CM

## 2016-11-15 DIAGNOSIS — R0600 Dyspnea, unspecified: Secondary | ICD-10-CM

## 2016-11-15 NOTE — Procedures (Signed)
    Patient Name: Alexa [name], Alexa [name] Date: 10/11/2016 Gender: Female D.O.B: 15-Aug-1975 Age (years): 40 Referring Provider: Kerin Ransom Height (inches): 65 Interpreting Physician: Shelva Majestic MD, ABSM Weight (lbs): 420 RPSGT: Jacolyn Reedy BMI: 70 MRN: [id number] Neck Size: 15.50  CLINICAL INFORMATION Sleep Study Type: Home Sleep Test  Indication for sleep study: Snoring  Epworth Sleepiness Score: 17  SLEEP STUDY TECHNIQUE A multi-channel overnight portable sleep study was performed. The channels recorded were: nasal airflow, thoracic respiratory movement, and oxygen saturation with a pulse oximetry. Snoring was also monitored.  MEDICATIONS *    ferrous sulfate 325 (65 FE) MG tablet    Take 325 mg by mouth daily with breakfast.           *    furosemide (LASIX) 40 MG tablet    take 1 tablet by mouth once daily     *    glucose blood test strip    ascensia  Meter/strips preferred Test daily Use as instructed Dx.dm     *    Lancets (ONETOUCH ULTRASOFT) lancets    Test daily Use as instructed dm2     *    losartan (COZAAR) 100 MG tablet    Take 1 tablet (100 mg total) by mouth daily.     *    metFORMIN (GLUCOPHAGE) 500 MG tablet    Take 1 tablet (500 mg total) by mouth 2 (two) times daily with a meal.     *    nystatin (MYCOSTATIN/NYSTOP) powder    Apply topically 4 (four) times daily.     *    warfarin (COUMADIN) 7.5 MG tablet    take 2 tablets by mouth daily  Patient self administered medications include: N/A.  SLEEP ARCHITECTURE Patient was studied for 287.0 minutes. The sleep efficiency was 77.0 % and the patient was supine for 64.7%. The arousal index was 0.0 per hour.  RESPIRATORY PARAMETERS The overall AHI was 82.0 per hour, with a central apnea index of 0.0 per hour.  The oxygen nadir was 50% during sleep.  CARDIAC DATA Mean heart rate during sleep was 106.0 bpm.  The highest heart rate recorded was 123 bpm.  IMPRESSIONS - Severe obstructive sleep  apnea occurred during this study (AHI = 82.0/h). - No significant central sleep apnea occurred during this study (CAI = 0.0/h). - Severe oxygen desaturation was noted during this study (Min O2 = 50%). - Patient snored 55.2% during the sleep.  DIAGNOSIS - Obstructive Sleep Apnea (327.23 [G47.33 ICD-10]) - Nocturnal Hypoxemia (327.26 [G47.36 ICD-10])  RECOMMENDATIONS - In light of the patient's significant cardiovascular comorbidities including hypertension, diabetes mellitus, pulmonary embolism, and the severity of her sleep apnea with significant oxygen desaturation to a nadir of 50%, recommend an expeditious in lab CPAP titration study. - Effort should be made to optimize nasal and oral pharyngeal patency - Avoid alcohol, sedatives and other CNS depressants that may worsen sleep apnea and disrupt normal sleep architecture. - Sleep hygiene should be reviewed to assess factors that may improve sleep quality. - Weight management and regular exercise should be initiated or continued. - Recommend a sleep clinic evaluation following the CPAP titration study and initiation of CPAP therapy. -  [Electronically signed] 11/15/2016 11:14 AM  Shelva Majestic MD, [facility] ABSM Diplomate, American Board of Sleep Medicine NPI: [id number]  [place] PH: [phone]   FX: [phone] [place]

## 2016-11-15 NOTE — Progress Notes (Signed)
SIX MIN WALK 11/15/2016 08/08/2016  Medications iron 325mg , gabapentin 300mg , losartan 100mg , metformin 500mg  @ 1100 -  Supplimental Oxygen during Test? (L/min) No No  Laps 4 -  Partial Lap (in Meters) 0 -  Baseline BP (sitting) 126/84 -  Baseline Heartrate 98 -  Baseline Dyspnea (Borg Scale) 0 -  Baseline Fatigue (Borg Scale) 2 -  Baseline SPO2 95 -  BP (sitting) 168/94 -  Heartrate 130 -  Dyspnea (Borg Scale) 2 -  Fatigue (Borg Scale) 0 -  SPO2 89 -  BP (sitting) 152/84 -  Heartrate 108 -  SPO2 94 -  Stopped or Paused before Six Minutes Yes -  Other Symptoms at end of Exercise shortness of breath, calf fatigue -  Interpretation Calf pain -  Distance Completed 192 -  Tech Comments: forehead probe used for the entirety of the test.  patient walked at a slow pace with the assistance of a cane and paused x2 d/t shortness of breath.  patient saturations did drop to 88% but only momentarily before recovering to 91% before clock could be stopped.  patient stopped with 18 seconds left due to shortness of breath and calf pain that pt reported is normal with this level of exertion.  pt refused to continue walking for the remainder of the 18 seconds.  patient denied any chest pain or dizziness. Pt walked at moderate pace with steady gait using her cane for support occasionally.

## 2016-11-15 NOTE — Progress Notes (Signed)
Subjective:    Patient ID: Alexa [name], female    DOB: 1976/02/10, 41 y.o.   MRN: [id number]  [facility].:  Follow-up for Recurrent Pulmonary Embolism.   HPI Recurrent pulmonary embolism: Currently on systemic anticoagulation with Coumadin managed by her PCP. Referred to hematology at last appointment. Recommendation agreed with continued anticoagulation lifelong. She reports continued dyspnea on exertion. She reports no excessive bleeding during her menstrual cycle.   Review of Systems She has a cough that is producing a clear to slight cloudy mucus. No fever or chills. No chest pain or pressure.   Allergies  Allergen Reactions  . Penicillins Hives    Has patient had a PCN reaction causing immediate rash, facial/tongue/throat swelling, SOB or lightheadedness with hypotension: No Has patient had a PCN reaction causing severe rash involving mucus membranes or skin necrosis: No Has patient had a PCN reaction that required hospitalization No Has patient had a PCN reaction occurring within the last 10 years: No If all of the above answers are "NO", then may proceed with Cephalosporin use.    Current Outpatient Prescriptions on File Prior to Visit  Medication Sig Dispense Refill  . ferrous sulfate 325 (65 FE) MG tablet Take 325 mg by mouth daily with breakfast.    . furosemide (LASIX) 40 MG tablet take 1 tablet by mouth once daily 30 tablet 0  . gabapentin (NEURONTIN) 300 MG capsule Take 1 capsule (300 mg total) by mouth 2 (two) times daily. Start with bedtime only, then increase to twice per day 60 capsule 1  . glucose blood test strip ascensia  Meter/strips preferred Test daily Use as instructed Dx.dm 100 each 3  . Lancets (ONETOUCH ULTRASOFT) lancets Test daily Use as instructed dm2 100 each 12  . losartan (COZAAR) 100 MG tablet Take 1 tablet (100 mg total) by mouth daily. 90 tablet 0  . metFORMIN (GLUCOPHAGE) 500 MG tablet Take 1 tablet (500 mg total) by mouth 2 (two) times daily with a  meal. 180 tablet 3  . nystatin (MYCOSTATIN/NYSTOP) powder Apply topically 4 (four) times daily. 60 g 1  . warfarin (COUMADIN) 7.5 MG tablet Take 2 tablets (15 mg total) by mouth daily. 60 tablet 1   No current facility-administered medications on file prior to visit.     Past Medical History:  Diagnosis Date  . Gestational diabetes   . Hypertension     Past Surgical History:  Procedure Laterality Date  . CESAREAN SECTION      Family History  Problem Relation Age of Onset  . Diabetes Maternal Grandmother   . Cancer Maternal Grandmother        breast cancer   . Hypertension Mother   . Cancer Maternal Aunt 55       breast cancer   . Cancer Maternal Aunt 34       breast cancer     Social History   Social History  . Marital status: Divorced    Spouse name: n/a  . Number of children: 0  . Years of education: N/A   Occupational History  . [employer]   Social History Main Topics  . Smoking status: Former Smoker    Types: Cigarettes  . Smokeless tobacco: Never Used     Comment: only smokes when she drinks alcohol - 1 pack per week  . Alcohol use 2.4 - 3.6 oz/week    4 - 6 Standard drinks or equivalent per week  Comment: mixed drinks 3-4 times/weeks  . Drug use: No  . Sexual activity: Not Asked   Other Topics Concern  . None   Social History Narrative   Her children live with her.   Ex-husband lives locally.      Objective:   Physical Exam BP 134/76 (BP Location: Left Wrist, Cuff Size: Normal)   Pulse (!) 104   Ht 5\' 4"  (1.626 m)   Wt (!) 435 lb 9.6 oz (197.6 kg)   SpO2 92%   BMI 74.77 kg/m   General:  Morbidly obese. Comfortable. No distress. Integument:  Warm & dry. No rash on exposed skin. No bruising on exposed skin. Extremities:  No cyanosis or clubbing.  HEENT:  Moist mucus membranes. Mild bilateral nasal turbinate swelling left greater than right. No scleral icterus. Cardiovascular:  Regular rate. Pitting lower  extremity edema. Unable to appreciate JVD given body positioning and body habitus. Pulmonary:  Clear bilaterally to auscultation. Good aeration. No accessory muscle use on room air. Abdomen: Soft. Normal bowel sounds. Protuberant. Musculoskeletal:  Normal bulk and tone. No joint deformity or effusion appreciated.  6MWT 11/15/16:  Walked 192 meters / Baseline Sat 95% on RA / Nadir Sat 89% on RA  IMAGING VENOUS DUPLEX 05/14/16 (per vascular surgery): Severely difficult due to body habitus and somewhat inconclusive. No evidence of DVT.  CTA CHEST 05/14/16 (per radiologist):  Positive for acute pulmonary embolism with RV/LV ratio 1.26. Thrombus in distal descending thoracic aorta and proximal abdominal aorta. Mild basilar lung opacities.  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 No definite distal emboli are identified. Possible splenic infarcts versus inhomogeneous early phase enhancement. Bilateral inguinal and iliac adenopathy.  CARDIAC TTE (07/31/16): LV normal in size with EF 60-65%. No regional wall motion abnormalities. LV diastolic function normal LA & RA normal in size. RV normal in size and function. Pulmonary artery systolic pressure 36 mmHg. No aortic stenosis or regurgitation. Aortic root normal in size. No mitral stenosis or regurgitation. No pulmonic stenosis. Moderate valve poorly visualized. No tricuspid regurgitation. No pericardial effusion.  LABS 05/14/16 Prothrombin Gene Mutation:  Negative Factor V Leiden:  Negative     Assessment & Plan:  41 y.o. female with recurrent unprovoked pulmonary embolism. Currently on systemic anticoagulation with Coumadin. Patient's walk test today demonstrates a significant desaturation but no oxygen requirement with exertion. We discussed her recurrent pulmonary embolism and my  recommendation for chronic anticoagulation. I instructed the patient to contact me if she had any new breathing problems or questions as I would be happy to see her back if needed.  1. Recurrent pulmonary emboli: Has follow-up with hematology. Recommend lifelong anticoagulation as long as tolerated. 2. Follow-up: Return to clinic as needed.  Sonia Baller Ashok Cordia, M.D. [facility] Pager:  [phone] After 3pm or if no response, call [phone] 4:36 PM 11/15/16

## 2016-11-15 NOTE — Patient Instructions (Signed)
   Call our office if you need anything or have any questions.  I will see you back as needed.

## 2016-11-19 ENCOUNTER — Other Ambulatory Visit: Payer: Self-pay | Admitting: *Deleted

## 2016-11-19 ENCOUNTER — Telehealth: Payer: Self-pay | Admitting: *Deleted

## 2016-11-19 DIAGNOSIS — G4733 Obstructive sleep apnea (adult) (pediatric): Secondary | ICD-10-CM

## 2016-11-19 DIAGNOSIS — I5189 Other ill-defined heart diseases: Secondary | ICD-10-CM

## 2016-11-19 DIAGNOSIS — I119 Hypertensive heart disease without heart failure: Secondary | ICD-10-CM

## 2016-11-19 DIAGNOSIS — Z86718 Personal history of other venous thrombosis and embolism: Secondary | ICD-10-CM

## 2016-11-19 DIAGNOSIS — IMO0001 Reserved for inherently not codable concepts without codable children: Secondary | ICD-10-CM

## 2016-11-19 DIAGNOSIS — E1149 Type 2 diabetes mellitus with other diabetic neurological complication: Secondary | ICD-10-CM

## 2016-11-19 NOTE — Telephone Encounter (Signed)
-----   Message from Troy Sine, MD sent at 11/15/2016 11:18 AM EDT ----- Please schedule patient for CPAP titration trial as soon as possible.  This should be an in lab CPAP titration not a home AutoPap.  Due to the patient's severe sleep apnea with significant oxygen desaturation.  Try to schedule the CPAP titration as soon as possible.

## 2016-11-19 NOTE — Telephone Encounter (Signed)
Called patient to make aware of sleep study results. CPAP titration ordered and scheduled 10/14, can scheduler sooner if auth completed.   Message sent to precert to expedite authorization per Dr. Claiborne Billings.    Attempt to call patient, left message to call back.

## 2016-11-21 NOTE — Telephone Encounter (Signed)
Patient aware of sleep study results and CPAP titration date.  Advised once we get approved by insurance we will attempt to get study sooner.   Patient aware and verbalized understanding.

## 2016-11-21 NOTE — Telephone Encounter (Signed)
Follow up      Returning a call to the nurse regarding sleep study results.  Ok to leave msg on vm.  Patient is at work

## 2016-11-22 NOTE — Telephone Encounter (Signed)
Titration has been authorized per pre cert-called and rescheduled for 9/5.    Patient called, left message to make aware.  Request call back to confirm.

## 2016-11-23 NOTE — Telephone Encounter (Signed)
Follow up     Pt can do the appt for 11/28/16 8p , she will give you call back on Tuesday,  How long is she going to be at the sleep study?

## 2016-11-23 NOTE — Telephone Encounter (Signed)
Left message to call back.  Calling to confirm titration that is scheduled.

## 2016-11-27 ENCOUNTER — Ambulatory Visit: Payer: 59 | Admitting: Registered"

## 2016-11-28 ENCOUNTER — Ambulatory Visit: Payer: 59 | Admitting: Registered"

## 2016-11-28 ENCOUNTER — Ambulatory Visit (HOSPITAL_BASED_OUTPATIENT_CLINIC_OR_DEPARTMENT_OTHER): Payer: 59 | Attending: Cardiovascular Disease | Admitting: Cardiovascular Disease

## 2016-11-28 VITALS — Ht 64.0 in | Wt >= 6400 oz

## 2016-11-28 DIAGNOSIS — E1149 Type 2 diabetes mellitus with other diabetic neurological complication: Secondary | ICD-10-CM | POA: Diagnosis not present

## 2016-11-28 DIAGNOSIS — I119 Hypertensive heart disease without heart failure: Secondary | ICD-10-CM

## 2016-11-28 DIAGNOSIS — Z79899 Other long term (current) drug therapy: Secondary | ICD-10-CM | POA: Diagnosis not present

## 2016-11-28 DIAGNOSIS — G4733 Obstructive sleep apnea (adult) (pediatric): Secondary | ICD-10-CM | POA: Insufficient documentation

## 2016-11-28 DIAGNOSIS — IMO0001 Reserved for inherently not codable concepts without codable children: Secondary | ICD-10-CM

## 2016-11-28 DIAGNOSIS — I5189 Other ill-defined heart diseases: Secondary | ICD-10-CM

## 2016-11-28 DIAGNOSIS — Z7984 Long term (current) use of oral hypoglycemic drugs: Secondary | ICD-10-CM | POA: Diagnosis not present

## 2016-11-28 DIAGNOSIS — Z86718 Personal history of other venous thrombosis and embolism: Secondary | ICD-10-CM | POA: Insufficient documentation

## 2016-11-29 NOTE — Telephone Encounter (Signed)
CPAP titration completed 9/5

## 2016-12-02 ENCOUNTER — Other Ambulatory Visit: Payer: Self-pay | Admitting: Physician Assistant

## 2016-12-02 DIAGNOSIS — R609 Edema, unspecified: Secondary | ICD-10-CM

## 2016-12-04 ENCOUNTER — Ambulatory Visit (INDEPENDENT_AMBULATORY_CARE_PROVIDER_SITE_OTHER): Payer: 59 | Admitting: Pharmacist Clinician (PhC)/ Clinical Pharmacy Specialist

## 2016-12-04 DIAGNOSIS — Z7901 Long term (current) use of anticoagulants: Secondary | ICD-10-CM

## 2016-12-04 DIAGNOSIS — Z5181 Encounter for therapeutic drug level monitoring: Secondary | ICD-10-CM

## 2016-12-04 DIAGNOSIS — I2699 Other pulmonary embolism without acute cor pulmonale: Secondary | ICD-10-CM

## 2016-12-04 LAB — POCT INR: INR: 3.3

## 2016-12-11 ENCOUNTER — Encounter: Payer: Self-pay | Admitting: Physician Assistant

## 2016-12-11 ENCOUNTER — Ambulatory Visit (INDEPENDENT_AMBULATORY_CARE_PROVIDER_SITE_OTHER): Payer: 59 | Admitting: Physician Assistant

## 2016-12-11 VITALS — BP 147/74 | HR 84 | Temp 98.6°F | Resp 18 | Ht 64.0 in | Wt >= 6400 oz

## 2016-12-11 DIAGNOSIS — L509 Urticaria, unspecified: Secondary | ICD-10-CM

## 2016-12-11 DIAGNOSIS — R220 Localized swelling, mass and lump, head: Secondary | ICD-10-CM | POA: Diagnosis not present

## 2016-12-11 DIAGNOSIS — L500 Allergic urticaria: Secondary | ICD-10-CM

## 2016-12-11 LAB — POCT RAPID STREP A (OFFICE): Rapid Strep A Screen: NEGATIVE

## 2016-12-11 MED ORDER — CETIRIZINE HCL 10 MG PO TABS: 10.0000 mg | ORAL_TABLET | Freq: Every day | ORAL | 1 refills | Status: DC

## 2016-12-11 MED ORDER — PREDNISONE 20 MG PO TABS: ORAL_TABLET | ORAL | 0 refills | Status: DC

## 2016-12-11 NOTE — Patient Instructions (Addendum)
Angioedema Angioedema is sudden swelling in the body. The swelling can happen in any part of the body. It often happens on the skin and causes itchy, bumpy patches (hives) to form. This condition may:  Happen only one time.  Happen more than one time. It may come back at random times.  Keep coming back for a number of years. Someday it may stop coming back.  Follow these instructions at home:  Take over-the-counter and prescription medicines only as told by your doctor.  If you were given medicines for emergency allergy treatment, always carry them with you.  Wear a medical bracelet as told by your doctor.  Avoid the things that cause your attacks (triggers).  If this condition was passed to you from your parents and you want to have kids, talk to your doctor. Your kids may also have this condition. Contact a doctor if:  You have another attack.  Your attacks happen more often, even after you take steps to prevent them.  This condition was passed to you by your parents and you want to have kids. Get help right away if:  Your mouth, tongue, or lips get very swollen.  You have trouble breathing.  You have trouble swallowing.  You pass out (faint). This information is not intended to replace advice given to you by your health care provider. Make sure you discuss any questions you have with your health care provider. Document Released: 02/28/2009 Document Revised: 10/12/2015 Document Reviewed: 09/20/2015 Elsevier Interactive Patient Education  2017 [employer].     IF you received an x-ray today, you will receive an invoice from [facility]. Please contact [facility] at [phone] with questions or concerns regarding your invoice.   IF you received labwork today, you will receive an invoice from [facility]. Please contact [facility] at [phone] with questions or concerns regarding your invoice.   Our billing staff will not be able to assist you with  questions regarding bills from these companies.  You will be contacted with the lab results as soon as they are available. The fastest way to get your results is to activate your My Chart account. Instructions are located on the last page of this paperwork. If you have not heard from [place] regarding the results in 2 weeks, please contact this office.

## 2016-12-11 NOTE — Progress Notes (Signed)
PRIMARY CARE AT [facility] [address], [place] [address] [phone]  Date:  12/11/2016   Name:  Alexa [name]   DOB:  September 09, 1975   MRN:  [id number]  PCP:  Joretta Bachelor, PA    History of Present Illness:  Alexa [name] is a 41 y.o. female patient who presents to PCP with  Chief Complaint  Patient presents with  . Urticaria    per patient, hives across body x 2.5 weeks,patient also states that face is slightly swollen. Patient denies any new detergents or cleaning products, states that she changed to [place] soap appx 3 weeks ago     Starting after 1pm for the last 2.5 weeks.  One week ago, it progressively worsened.  To anytime of day and along the face.  She can be anywhere and the symptoms occur.  It is pruritic.  She will get a heated sensation and then the hive will start.  She took a benadryl since one week ago, which helps.  She takes one in the afternoon. She recently switched back to ivory soap for the body 3 weeks ago  Nothing new that she has been eating.   Tilapia 2 weeks ago, and soon after you developed lip swelling 5 minutes.   She took ibuprofen yesterday for an earache.  She has not taken it in the last 3 weeks.   She is taking the losartan.  She recently changed the type of pill (manufacturing) She had sore throat yesterday, but this had resolved.   No sob or dyspnea.  Does not feel the sensation of throat closure.    Patient Active Problem List   Diagnosis Date Noted  . Personal history of venous thrombosis and embolism 10/06/2016  . Pelvic lymphadenopathy 10/06/2016  . Adenopathy 08/08/2016  . Aortic thrombus ([place]) 06/19/2016  . Chronic anticoagulation 06/19/2016  . Diastolic dysfunction 71/08/2692  . HCD (hypertensive cardiovascular disease) 06/19/2016  . Anticoagulated on Coumadin 06/04/2016  . Other acute pulmonary embolism without acute cor pulmonale ([place]) 06/04/2016  . Acute pulmonary embolus ([place]) 05/14/2016  . Acute respiratory failure  with hypoxia ([place]) 05/14/2016  . Hypoxia   . Class 3 obesity due to excess calories with serious comorbidity and body mass index (BMI) greater than or equal to 70 in adult ([place]) 04/15/2016  . Athlete's foot 10/08/2015  . 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 Benign essential HTN 06/22/2015  . 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 BMI 70 and over, adult ([place]) 06/22/2015  . Vitamin D deficiency 06/22/2015  . Iron deficiency anemia 06/22/2015    Past Medical History:  Diagnosis Date  . Gestational diabetes   . Hypertension     Past Surgical History:  Procedure Laterality Date  . CESAREAN SECTION      Social History  Substance Use Topics  . Smoking status: Former Smoker    Types: Cigarettes  . Smokeless tobacco: Never Used     Comment: only smokes when she drinks alcohol - 1 pack per week  . Alcohol use 2.4 - 3.6 oz/week    4 - 6 Standard drinks or equivalent per week     Comment: mixed drinks 3-4 times/weeks    Family History  Problem Relation Age of Onset  . Diabetes Maternal Grandmother   . Cancer Maternal Grandmother        breast cancer   . Hypertension Mother   . Cancer Maternal Aunt 33  breast cancer   . Cancer Maternal Aunt 55       breast cancer     Allergies  Allergen Reactions  . Penicillins Hives    Has patient had a PCN reaction causing immediate rash, facial/tongue/throat swelling, SOB or lightheadedness with hypotension: No Has patient had a PCN reaction causing severe rash involving mucus membranes or skin necrosis: No Has patient had a PCN reaction that required hospitalization No Has patient had a PCN reaction occurring within the last 10 years: No If all of the above answers are "NO", then may proceed with Cephalosporin use.    Medication list has been reviewed and updated.  Current Outpatient Prescriptions on File Prior to Visit  Medication Sig Dispense Refill  . ferrous  sulfate 325 (65 FE) MG tablet Take 325 mg by mouth daily with breakfast.    . furosemide (LASIX) 40 MG tablet take 1 tablet by mouth once daily 30 tablet 0  . gabapentin (NEURONTIN) 300 MG capsule Take 1 capsule (300 mg total) by mouth 2 (two) times daily. Start with bedtime only, then increase to twice per day 60 capsule 1  . glucose blood test strip ascensia  Meter/strips preferred Test daily Use as instructed Dx.dm 100 each 3  . Lancets (ONETOUCH ULTRASOFT) lancets Test daily Use as instructed dm2 100 each 12  . losartan (COZAAR) 100 MG tablet Take 1 tablet (100 mg total) by mouth daily. 90 tablet 0  . metFORMIN (GLUCOPHAGE) 500 MG tablet Take 1 tablet (500 mg total) by mouth 2 (two) times daily with a meal. 180 tablet 3  . nystatin (MYCOSTATIN/NYSTOP) powder Apply topically 4 (four) times daily. 60 g 1  . warfarin (COUMADIN) 7.5 MG tablet Take 2 tablets (15 mg total) by mouth daily. 60 tablet 1   No current facility-administered medications on file prior to visit.     ROS ROS otherwise unremarkable unless listed above.  Physical Examination: BP (!) 147/74 (BP Location: Right Wrist, Patient Position: Sitting, Cuff Size: Large)   Pulse 84   Temp 98.6 F (37 C) (Oral)   Resp 18   Ht 5\' 4"  (1.626 m)   Wt (!) 442 lb 9.6 oz (200.8 kg)   SpO2 94%   BMI 75.97 kg/m  Ideal Body Weight: Weight in (lb) to have BMI = 25: 145.3  Physical Exam  Constitutional: She is oriented to person, place, and time. She appears well-developed and well-nourished. No distress.  HENT:  Head: Normocephalic and atraumatic.  Right Ear: External ear normal.  Left Ear: External ear normal.  Eyes: Pupils are equal, round, and reactive to light. Conjunctivae and EOM are normal.  Cardiovascular: Normal rate, regular rhythm and intact distal pulses.  Exam reveals no friction rub.   No murmur heard. Pulmonary/Chest: Effort normal and breath sounds normal. No respiratory distress.  Neurological: She is alert and  oriented to person, place, and time.  Skin: She is not diaphoretic.  Edematous eyelid, and lip swelling of right lip.  No erythema  Psychiatric: She has a normal mood and affect. Her behavior is normal.    Results for orders placed or performed in visit on 12/11/16  POCT rapid strep A  Result Value Ref Range   Rapid Strep A Screen Negative Negative     Assessment and Plan: Alexa [name] is a 41 y.o. female who is here today for  Chief Complaint  Patient presents with  . Urticaria    per patient, hives across body x 2.5 weeks,patient  also states that face is slightly swollen. Patient denies any new detergents or cleaning products, states that she changed to [place] soap appx 3 weeks ago   we will attempt a short prednisone taper, and 2nd Gen antihistamine.  She will follow up in 1 week.  We may then stop the taper if symptoms resolve (and anti-histamine).  Symptoms recur may have to consider stopping losartan.   Allergic urticaria - Plan: cetirizine (ZYRTEC) 10 MG tablet, predniSONE (DELTASONE) 20 MG tablet  Hives - Plan: POCT rapid strep A, CBC with Differential/Platelet, Sedimentation Rate  Lip swelling - Plan: POCT rapid strep A, CBC with Differential/Platelet, Sedimentation Rate  Ivar Drape, PA-C [facility] 9/23/20183:58 PM

## 2016-12-12 LAB — CBC WITH DIFFERENTIAL/PLATELET
Basophils Absolute: 0 10*3/uL (ref 0.0–0.2)
Basos: 0 %
EOS (ABSOLUTE): 0 10*3/uL (ref 0.0–0.4)
EOS: 0 %
HEMATOCRIT: 44.2 % (ref 34.0–46.6)
HEMOGLOBIN: 14 g/dL (ref 11.1–15.9)
IMMATURE GRANS (ABS): 0 10*3/uL (ref 0.0–0.1)
Immature Granulocytes: 0 %
LYMPHS ABS: 1.5 10*3/uL (ref 0.7–3.1)
LYMPHS: 20 %
MCH: 27.5 pg (ref 26.6–33.0)
MCHC: 31.7 g/dL (ref 31.5–35.7)
MCV: 87 fL (ref 79–97)
Monocytes Absolute: 0.7 10*3/uL (ref 0.1–0.9)
Monocytes: 9 %
NEUTROS ABS: 5.3 10*3/uL (ref 1.4–7.0)
Neutrophils: 71 %
Platelets: 306 10*3/uL (ref 150–379)
RBC: 5.09 x10E6/uL (ref 3.77–5.28)
RDW: 19.5 % — ABNORMAL HIGH (ref 12.3–15.4)
WBC: 7.5 10*3/uL (ref 3.4–10.8)

## 2016-12-12 LAB — SEDIMENTATION RATE: Sed Rate: 22 mm/hr (ref 0–32)

## 2016-12-18 ENCOUNTER — Encounter: Payer: 59 | Attending: Physician Assistant | Admitting: Registered"

## 2016-12-18 ENCOUNTER — Ambulatory Visit (INDEPENDENT_AMBULATORY_CARE_PROVIDER_SITE_OTHER): Payer: 59 | Admitting: Physician Assistant

## 2016-12-18 ENCOUNTER — Encounter: Payer: Self-pay | Admitting: Physician Assistant

## 2016-12-18 ENCOUNTER — Encounter: Payer: Self-pay | Admitting: Registered"

## 2016-12-18 VITALS — BP 142/88 | HR 90 | Resp 18 | Ht 64.0 in | Wt >= 6400 oz

## 2016-12-18 DIAGNOSIS — IMO0001 Reserved for inherently not codable concepts without codable children: Secondary | ICD-10-CM

## 2016-12-18 DIAGNOSIS — Z09 Encounter for follow-up examination after completed treatment for conditions other than malignant neoplasm: Secondary | ICD-10-CM

## 2016-12-18 DIAGNOSIS — R21 Rash and other nonspecific skin eruption: Secondary | ICD-10-CM | POA: Diagnosis not present

## 2016-12-18 DIAGNOSIS — E118 Type 2 diabetes mellitus with unspecified complications: Secondary | ICD-10-CM | POA: Diagnosis not present

## 2016-12-18 DIAGNOSIS — Z713 Dietary counseling and surveillance: Secondary | ICD-10-CM | POA: Insufficient documentation

## 2016-12-18 DIAGNOSIS — R5383 Other fatigue: Secondary | ICD-10-CM

## 2016-12-18 DIAGNOSIS — E1149 Type 2 diabetes mellitus with other diabetic neurological complication: Secondary | ICD-10-CM

## 2016-12-18 NOTE — Progress Notes (Signed)
PRIMARY CARE AT [facility] [address] [place] [address] [phone]  Date:  12/18/2016   Name:  Alexa [name]   DOB:  1975-06-16   MRN:  [id number]  PCP:  Joretta Bachelor, PA    History of Present Illness:  Alexa [name] is a 41 y.o. female patient who presents to PCP with  Chief Complaint  Patient presents with  . Follow-up    patient presents for follow up on rash and swelling, per patient swelling in face has decreased, no new areas orf rash. Patient states that her legs are retaining water.     Patient reports that her swelling and rash stopped immediately when the day she came here and started the zyrtec.  She notes that she did not start the prednisone the next morning as instructed.  She has not had any rash or swelling arise since.  She has not changed any of her hygiene products, including the soap (irish spring) advised at last visit.  She is taking her medications, including the losartan.   She is concerned that she is gaining weight again.  She does not know hat to eat.  She saw a nutritionist this week, and has a plan to start her dieting better.  She also plans to start a swim class this week.   She is drinking 1-2 sodas per day.  She eats what her family makes, which are a lot of sauces, starches, and fried.  She is going to start making her own foods.   She reports increased swelling of extremities.  No drainage.  She is taking the 40 mg furosemide. She has no chest pains or palpitations.  Feels sob with heavy exertion.   No hx of responding to any medication with rash.  She has never been placed on lisinopril that she knows.   Patient Active Problem List   Diagnosis Date Noted  . Personal history of venous thrombosis and embolism 10/06/2016  . Pelvic lymphadenopathy 10/06/2016  . Adenopathy 08/08/2016  . Aortic thrombus ([place]) 06/19/2016  . Chronic anticoagulation 06/19/2016  . Diastolic dysfunction 51/04/5850  . HCD (hypertensive cardiovascular  disease) 06/19/2016  . Anticoagulated on Coumadin 06/04/2016  . Other acute pulmonary embolism without acute cor pulmonale ([place]) 06/04/2016  . Acute pulmonary embolus ([place]) 05/14/2016  . Acute respiratory failure with hypoxia ([place]) 05/14/2016  . Hypoxia   . Class 3 obesity due to excess calories with serious comorbidity and body mass index (BMI) greater than or equal to 70 in adult ([place]) 04/15/2016  . Athlete's foot 10/08/2015  . 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 Benign essential HTN 06/22/2015  . 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 BMI 70 and over, adult ([place]) 06/22/2015  . Vitamin D deficiency 06/22/2015  . Iron deficiency anemia 06/22/2015    Past Medical History:  Diagnosis Date  . Gestational diabetes   . Hypertension     Past Surgical History:  Procedure Laterality Date  . CESAREAN SECTION      Social History  Substance Use Topics  . Smoking status: Former Smoker    Types: Cigarettes  . Smokeless tobacco: Never Used     Comment: only smokes when she drinks alcohol - 1 pack per week  . Alcohol use 2.4 - 3.6 oz/week    4 - 6 Standard drinks or equivalent per week     Comment: mixed drinks 3-4 times/weeks    Family History  Problem Relation Age of  Onset  . Diabetes Maternal Grandmother   . Cancer Maternal Grandmother        breast cancer   . Hypertension Mother   . Cancer Maternal Aunt 55       breast cancer   . Cancer Maternal Aunt 55       breast cancer     Allergies  Allergen Reactions  . Penicillins Hives    Has patient had a PCN reaction causing immediate rash, facial/tongue/throat swelling, SOB or lightheadedness with hypotension: No Has patient had a PCN reaction causing severe rash involving mucus membranes or skin necrosis: No Has patient had a PCN reaction that required hospitalization No Has patient had a PCN reaction occurring within the last 10 years: No If all of the above  answers are "NO", then may proceed with Cephalosporin use.    Medication list has been reviewed and updated.  Current Outpatient Prescriptions on File Prior to Visit  Medication Sig Dispense Refill  . cetirizine (ZYRTEC) 10 MG tablet Take 1 tablet (10 mg total) by mouth daily. 30 tablet 1  . ferrous sulfate 325 (65 FE) MG tablet Take 325 mg by mouth daily with breakfast.     . furosemide (LASIX) 40 MG tablet take 1 tablet by mouth once daily 30 tablet 0  . gabapentin (NEURONTIN) 300 MG capsule Take 1 capsule (300 mg total) by mouth 2 (two) times daily. Start with bedtime only, then increase to twice per day 60 capsule 1  . glucose blood test strip ascensia  Meter/strips preferred Test daily Use as instructed Dx.dm 100 each 3  . Lancets (ONETOUCH ULTRASOFT) lancets Test daily Use as instructed dm2 100 each 12  . losartan (COZAAR) 100 MG tablet Take 1 tablet (100 mg total) by mouth daily. 90 tablet 0  . metFORMIN (GLUCOPHAGE) 500 MG tablet Take 1 tablet (500 mg total) by mouth 2 (two) times daily with a meal. 180 tablet 3  . nystatin (MYCOSTATIN/NYSTOP) powder Apply topically 4 (four) times daily. 60 g 1  . predniSONE (DELTASONE) 20 MG tablet Take 3 PO QAM x2days, 2 PO QAM x2days, 1 PO QAM x3days 13 tablet 0  . warfarin (COUMADIN) 7.5 MG tablet Take 2 tablets (15 mg total) by mouth daily. 60 tablet 1   No current facility-administered medications on file prior to visit.     ROS ROS otherwise unremarkable unless listed above.  Physical Examination: BP (!) 142/88 (BP Location: Right Wrist, Patient Position: Sitting, Cuff Size: Large)   Pulse 90   Resp 18   Ht 5\' 4"  (1.626 m)   Wt (!) 457 lb 9.6 oz (207.6 kg)   LMP 11/11/2016 (Within Days)   SpO2 94%   BMI 78.55 kg/m  Ideal Body Weight: Weight in (lb) to have BMI = 25: 145.3  Physical Exam  Constitutional: She is oriented to person, place, and time. She appears well-developed and well-nourished. No distress.  HENT:  Head:  Normocephalic and atraumatic.  Right Ear: External ear normal.  Left Ear: External ear normal.  Eyes: Pupils are equal, round, and reactive to light. Conjunctivae and EOM are normal.  Cardiovascular: Normal rate, regular rhythm and normal heart sounds.  Exam reveals no friction rub.   No murmur heard. Pulmonary/Chest: Effort normal. No respiratory distress. She has no wheezes.  Musculoskeletal: She exhibits edema.  Neurological: She is alert and oriented to person, place, and time.  Skin: She is not diaphoretic.  Psychiatric: She has a normal mood and affect. Her behavior is  normal.     Assessment and Plan: Alexa [name] is a 41 y.o. female who is here today for cc of following up on rash, and continued fatigue. She has had this same fatigue and sob chronically.  Likely the weight and deconditioning. Will recheck labs below, but recent metabolic  panel and cbc were unremarkable for her symptoms.   Rash is resolved.  She will finish the prednisone tomorrow.  Advised to continue to take the zyrtec for additional week.  If swelling occurs on face or eyes, advised to stop the losartan and return here same day/immediately.  This may be representative of angioedema, and this may be the first to change and address.    Rash and nonspecific skin eruption  Other fatigue - Plan: Vitamin D 1,25 dihydroxy, Vitamin B12, TSH  Follow up  Ivar Drape, PA-C [facility] 9/28/20189:06 AM

## 2016-12-18 NOTE — Patient Instructions (Addendum)
   Iron and Vit D deficiencies most likely contributing to fatigue:  Pick up iron prescription for higher dose iron than what you are getting with over the counter.  Check with doctor about vitamin D deficiency need for prescription.  If taking an over the counter iron, an Iron supplement that seems to be better at not causing constipation is [employer] Iron Extra.  Ask your doctor to check vit B12 level, Consider taking an over the counter supplement.  Use glass or corell to heat your food in the microwave  Return for more education on diabetes management

## 2016-12-18 NOTE — Patient Instructions (Signed)
Continue the zyrtec for the next week.   If the symptoms return, I want you to stop the losartan, and make an appointment for the same day.   I would like you to go the gym as you stated this week. I am giving you the dash diet which I would like you to follow.   DASH Eating Plan DASH stands for "Dietary Approaches to Stop Hypertension." The DASH eating plan is a healthy eating plan that has been shown to reduce high blood pressure (hypertension). It may also reduce your risk for type 2 diabetes, heart disease, and stroke. The DASH eating plan may also help with weight loss. What are tips for following this plan? General guidelines  Avoid eating more than 2,300 mg (milligrams) of salt (sodium) a day. If you have hypertension, you may need to reduce your sodium intake to 1,500 mg a day.  Limit alcohol intake to no more than 1 drink a day for nonpregnant women and 2 drinks a day for men. One drink equals 12 oz of beer, 5 oz of wine, or 1 oz of hard liquor.  Work with your health care provider to maintain a healthy body weight or to lose weight. Ask what an ideal weight is for you.  Get at least 30 minutes of exercise that causes your heart to beat faster (aerobic exercise) most days of the week. Activities may include walking, swimming, or biking.  Work with your health care provider or diet and nutrition specialist (dietitian) to adjust your eating plan to your individual calorie needs. Reading food labels  Check food labels for the amount of sodium per serving. Choose foods with less than 5 percent of the Daily Value of sodium. Generally, foods with less than 300 mg of sodium per serving fit into this eating plan.  To find whole grains, look for the word "whole" as the first word in the ingredient list. Shopping  Buy products labeled as "low-sodium" or "no salt added."  Buy fresh foods. Avoid canned foods and premade or frozen meals. Cooking  Avoid adding salt when cooking. Use  salt-free seasonings or herbs instead of table salt or sea salt. Check with your health care provider or pharmacist before using salt substitutes.  Do not fry foods. Cook foods using healthy methods such as baking, boiling, grilling, and broiling instead.  Cook with heart-healthy oils, such as olive, canola, soybean, or sunflower oil. Meal planning   Eat a balanced diet that includes: ? 5 or more servings of fruits and vegetables each day. At each meal, try to fill half of your plate with fruits and vegetables. ? Up to 6-8 servings of whole grains each day. ? Less than 6 oz of lean meat, poultry, or fish each day. A 3-oz serving of meat is about the same size as a deck of cards. One egg equals 1 oz. ? 2 servings of low-fat dairy each day. ? A serving of nuts, seeds, or beans 5 times each week. ? Heart-healthy fats. Healthy fats called Omega-3 fatty acids are found in foods such as flaxseeds and coldwater fish, like sardines, salmon, and mackerel.  Limit how much you eat of the following: ? Canned or prepackaged foods. ? Food that is high in trans fat, such as fried foods. ? Food that is high in saturated fat, such as fatty meat. ? Sweets, desserts, sugary drinks, and other foods with added sugar. ? Full-fat dairy products.  Do not salt foods before eating.  Try to  eat at least 2 vegetarian meals each week.  Eat more home-cooked food and less restaurant, buffet, and fast food.  When eating at a restaurant, ask that your food be prepared with less salt or no salt, if possible. What foods are recommended? The items listed may not be a complete list. Talk with your dietitian about what dietary choices are best for you. Grains Whole-grain or whole-wheat bread. Whole-grain or whole-wheat pasta. Brown rice. [place]. Bulgur. Whole-grain and low-sodium cereals. Pita bread. Low-fat, low-sodium crackers. Whole-wheat flour tortillas. Vegetables Fresh or frozen vegetables (raw, steamed,  roasted, or grilled). Low-sodium or reduced-sodium tomato and vegetable juice. Low-sodium or reduced-sodium tomato sauce and tomato paste. Low-sodium or reduced-sodium canned vegetables. Fruits All fresh, dried, or frozen fruit. Canned fruit in natural juice (without added sugar). Meat and other protein foods Skinless chicken or [place]. Ground chicken or [place]. Pork with fat trimmed off. Fish and seafood. Egg whites. Dried beans, peas, or lentils. Unsalted nuts, nut butters, and seeds. Unsalted canned beans. Lean cuts of beef with fat trimmed off. Low-sodium, lean deli meat. Dairy Low-fat (1%) or fat-free (skim) milk. Fat-free, low-fat, or reduced-fat cheeses. Nonfat, low-sodium ricotta or cottage cheese. Low-fat or nonfat yogurt. Low-fat, low-sodium cheese. Fats and oils Soft margarine without trans fats. Vegetable oil. Low-fat, reduced-fat, or light mayonnaise and salad dressings (reduced-sodium). Canola, safflower, olive, soybean, and sunflower oils. Avocado. Seasoning and other foods Herbs. Spices. Seasoning mixes without salt. Unsalted popcorn and pretzels. Fat-free sweets. What foods are not recommended? The items listed may not be a complete list. Talk with your dietitian about what dietary choices are best for you. Grains Baked goods made with fat, such as croissants, muffins, or some breads. Dry pasta or rice meal packs. Vegetables Creamed or fried vegetables. Vegetables in a cheese sauce. Regular canned vegetables (not low-sodium or reduced-sodium). Regular canned tomato sauce and paste (not low-sodium or reduced-sodium). Regular tomato and vegetable juice (not low-sodium or reduced-sodium). Angie Fava. Olives. Fruits Canned fruit in a light or heavy syrup. Fried fruit. Fruit in cream or butter sauce. Meat and other protein foods Fatty cuts of meat. Ribs. Fried meat. Berniece Salines. Sausage. Bologna and other processed lunch meats. Salami. Fatback. Hotdogs. Bratwurst. Salted nuts and seeds. Canned  beans with added salt. Canned or smoked fish. Whole eggs or egg yolks. Chicken or [place] with skin. Dairy Whole or 2% milk, cream, and half-and-half. Whole or full-fat cream cheese. Whole-fat or sweetened yogurt. Full-fat cheese. Nondairy creamers. Whipped toppings. Processed cheese and cheese spreads. Fats and oils Butter. Stick margarine. Lard. Shortening. Ghee. Bacon fat. Tropical oils, such as coconut, palm kernel, or palm oil. Seasoning and other foods Salted popcorn and pretzels. Onion salt, garlic salt, seasoned salt, table salt, and sea salt. Worcestershire sauce. Tartar sauce. Barbecue sauce. Teriyaki sauce. Soy sauce, including reduced-sodium. Steak sauce. Canned and packaged gravies. Fish sauce. Oyster sauce. Cocktail sauce. Horseradish that you find on the shelf. Ketchup. Mustard. Meat flavorings and tenderizers. Bouillon cubes. Hot sauce and Tabasco sauce. Premade or packaged marinades. Premade or packaged taco seasonings. Relishes. Regular salad dressings. Where to find more information:  National Heart, Lung, and [place]: https://wilson-eaton.com/  American Heart Association: www.heart.org Summary  The DASH eating plan is a healthy eating plan that has been shown to reduce high blood pressure (hypertension). It may also reduce your risk for type 2 diabetes, heart disease, and stroke.  With the DASH eating plan, you should limit salt (sodium) intake to 2,300 mg a day. If you have  hypertension, you may need to reduce your sodium intake to 1,500 mg a day.  When on the DASH eating plan, aim to eat more fresh fruits and vegetables, whole grains, lean proteins, low-fat dairy, and heart-healthy fats.  Work with your health care provider or diet and nutrition specialist (dietitian) to adjust your eating plan to your individual calorie needs. This information is not intended to replace advice given to you by your health care provider. Make sure you discuss any questions you have with your  health care provider. Document Released: 03/01/2011 Document Revised: 03/05/2016 Document Reviewed: 03/05/2016 Elsevier Interactive Patient Education  2017 [employer].

## 2016-12-18 NOTE — Progress Notes (Signed)
Diabetes Self-Management Education  Visit Type: First/Initial  Appt. Start Time: 0815 Appt. End Time: 4332  12/18/2016  [name], identified by name and date of birth, is a 41 y.o. female with a diagnosis of Diabetes: Type 2.   ASSESSMENT Pt states she would like to learn how to eat healthier, manage her diabetes and improve overall health. Pt states her legs cramp with extended walking, especially calfs, stretching helps. Pt states she is planning on her first cruise next year for her birthday and would like to be able to get around without being out of breath and legs cramping.  Pt states Feb 2018 was hospitalized for blood clots in lungs, and during her 1 week stay, lost 40 lbs due to frequent urination (fluid loss). Pt states issue has resolved, but medical team doing preventive treatment to avoid future complications. Pt states she is going to cardiologist next month to talk about sleep apnea test done 3 weeks ago. Pt reports poor sleep, goes to bed late and has to get up a lot during the night to urinate due to lasix. Pt feels she does not urinate during the day as much as she would expect based on how much she drinks, but does not take lasix during the day because she does not have regular breaks at work.  Pt states she was vegetarian for 3 years, loves vegetables. 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 Pt states she skips breakfast due to lack of time. Discussed strategies. Pt states occasional soda, "likes the burp" after a meal.  Pt states she has fatigue. Pt reports she is very busy, but feeling worn out. Pt is taking OTC iron but the dose is much lower than her prescription. Pt states she has irregular periods that have historically been heavy but even heavier since starting coumadin. Pt reports occasional constipation.  Pt likes to be socially engaged and when she has energy likes to participate in live theatre with  her kids.   RD mostly focused on importance of correcting vitamin/mineral difficiencies and getting into a regular breakfast routine. RD plans next visit to start at beginning of DM education as well as review coumadin and specific vegetables that may decrease effectiveness.     Diabetes Self-Management Education - 12/18/16 0845      Visit Information   Visit Type First/Initial     Initial Visit   Diabetes Type Type 2   Are you currently following a meal plan? No   Are you taking your medications as prescribed? No   Date Diagnosed 2016?     Health Coping   How would you rate your overall health? Fair     Psychosocial Assessment   Patient Belief/Attitude about Diabetes Motivated to manage diabetes   How often do you need to have someone help you when you read instructions, pamphlets, or other written materials from your doctor or pharmacy? 1 - Never   What is the last grade level you completed in school? associate degree      Complications   Last HgB A1C per patient/outside source 5.7 %  per patient   How often do you check your blood sugar? 0 times/day (not testing)   Have you had a dilated eye exam in the past 12 months? Yes   Have you had a dental exam in the past 12 months? No   Are you checking your feet? Yes   How  many days per week are you checking your feet? 7     Dietary Intake   Breakfast none OR 1-2 x/days sausage, egg bisuit, hashbrown, coffee, diet something   Snack (morning) none OR fruit   Lunch  usually packs lunch meat & cheese wraps OR leftovers OR eats out ([employer], [employer], [employer], taco bell)   Snack (afternoon) none   Dinner baked meat, cream of mushroom, vegetables, and starch  8 pm   Snack (evening) none OR fruit  late dinner so usually doesn't eat again   Beverage(s) iced coffee, water, diet drink, occassionallly slushie or regular soda     Exercise   Exercise Type ADL's   How many days per week to you exercise? 0   How many minutes  per day do you exercise? 0   Total minutes per week of exercise 0     Patient Education   Previous Diabetes Education No   Medications Reviewed patients medication for diabetes, action, purpose, timing of dose and side effects.   Monitoring Daily foot exams   Psychosocial adjustment Identified and addressed patients feelings and concerns about diabetes     Individualized Goals (developed by patient)   Nutrition Other (comment)  work on eating breakfast regularly   Medications take my medication as prescribed     Outcomes   Expected Outcomes Demonstrated interest in learning. Expect positive outcomes   Future DMSE 4-6 wks   Program Status Not Completed    Individualized Plan for Diabetes Self-Management Training:  Learning Objective:  Patient will have a greater understanding of diabetes self-management. Patient education plan is to attend individual and/or group sessions per assessed needs and concerns.  Patient Instructions   Iron and Vit D deficiencies most likely contributing to fatigue:  Pick up iron prescription for higher dose iron than what you are getting with over the counter.  Check with doctor about vitamin D deficiency need for prescription.  If taking an over the counter iron, an Iron supplement that seems to be better at not causing constipation is Vitanica Iron Extra.  Ask your doctor to check vit B12 level, Consider taking an over the counter supplement.  Use glass or corell to heat your food in the microwave  Return for more education on diabetes management   Expected Outcomes:  Demonstrated interest in learning. Expect positive outcomes  Education material provided: none  If problems or questions, patient to contact team via:  Phone and MyChart  Future DSME appointment: 4-6 wks

## 2016-12-20 ENCOUNTER — Other Ambulatory Visit: Payer: Self-pay | Admitting: Family Medicine

## 2016-12-20 ENCOUNTER — Ambulatory Visit: Payer: 59 | Admitting: Cardiovascular Disease

## 2016-12-22 NOTE — Telephone Encounter (Signed)
LMOM FOR PATIENT TO GIVE [place] A CALL TO SETUP AN OV WITH [name] ENGLISH FOR REFILL ON BLOOD PRESSURE MED

## 2016-12-28 LAB — VITAMIN D 1,25 DIHYDROXY
VITAMIN D 1, 25 (OH) TOTAL: 34 pg/mL
VITAMIN D3 1, 25 (OH): 34 pg/mL
Vitamin D2 1, 25 (OH)2: <10 pg/mL

## 2016-12-28 LAB — TSH: TSH: 2.5 u[IU]/mL (ref 0.450–4.500)

## 2016-12-28 LAB — VITAMIN B12

## 2017-01-02 ENCOUNTER — Other Ambulatory Visit: Payer: Self-pay | Admitting: Physician Assistant

## 2017-01-06 ENCOUNTER — Encounter (HOSPITAL_BASED_OUTPATIENT_CLINIC_OR_DEPARTMENT_OTHER): Payer: 59

## 2017-01-06 NOTE — Procedures (Signed)
Patient Name: Alexa [name], Alexa [name] Date: 11/28/2016 Gender: Female D.O.B: Jul 07, 1975 Age (years): 12 Referring Provider: Shelva Majestic MD, ABSM Height (inches): 65 Interpreting Physician: Shelva Majestic MD, ABSM Weight (lbs): 420 RPSGT: Jorge Ny BMI: 70 MRN: [id number] Neck Size: 15.50  CLINICAL INFORMATION The patient is referred for a BiPAP titration to treat sleep apnea.  Date of NPSG, Split Night or HST:  10/11/2016:  AHI 82.0/h with O2 saturation nadir 50%.  SLEEP STUDY TECHNIQUE As per the AASM Manual for the Scoring of Sleep and Associated Events v2.3 (April 2016) with a hypopnea requiring 4% desaturations.  The channels recorded and monitored were frontal, central and occipital EEG, electrooculogram (EOG), submentalis EMG (chin), nasal and oral airflow, thoracic and abdominal wall motion, anterior tibialis EMG, snore microphone, electrocardiogram, and pulse oximetry. Bilevel positive airway pressure (BPAP) was initiated at the beginning of the study and titrated to treat sleep-disordered breathing.  MEDICATIONS . ferrous sulfate 325 (65 FE) MG tablet Take 325 mg by mouth daily with breakfast.    . furosemide (LASIX) 40 MG tablet take 1 tablet by mouth once daily 30 tablet 0  . gabapentin (NEURONTIN) 300 MG capsule Take 1 capsule (300 mg total) by mouth 2 (two) times daily. Start with bedtime only, then increase to twice per day 60 capsule 1  . glucose blood test strip ascensia  Meter/strips preferred Test daily Use as instructed Dx.dm 100 each 3  . Lancets (ONETOUCH ULTRASOFT) lancets Test daily Use as instructed dm2 100 each 12  . losartan (COZAAR) 100 MG tablet Take 1 tablet (100 mg total) by mouth daily. 90 tablet 0  . metFORMIN (GLUCOPHAGE) 500 MG tablet Take 1 tablet (500 mg total) by mouth 2 (two) times daily with a meal. 180 tablet 3  . nystatin (MYCOSTATIN/NYSTOP) powder Apply topically 4 (four) times daily. 60 g 1  . warfarin (COUMADIN) 7.5 MG tablet  Take 2 tablets (15 mg total) by mouth daily.      Medications self-administered by patient taken the night of the study : N/A  RESPIRATORY PARAMETERS Optimal IPAP Pressure (cm): 24 AHI at Optimal Pressure (/hr) 0.0 Optimal EPAP Pressure (cm): 19   Overall Minimal O2 (%): 56.00 Minimal O2 at Optimal Pressure (%): 85.0 SLEEP ARCHITECTURE Start Time: 11:05:16 PM Stop Time: 5:47:26 AM Total Time (min): 402.2 Total Sleep Time (min): 336.0 Sleep Latency (min): 33.7 Sleep Efficiency (%): 83.5 REM Latency (min): 41.0 WASO (min): 32.5 Stage N1 (%): 2.38 Stage N2 (%): 58.63 Stage N3 (%): 0.00 Stage R (%): 38.99 Supine (%): 100.00 Arousal Index (/hr): 13.6      CARDIAC DATA The 2 lead EKG demonstrated sinus rhythm. The mean heart rate was 81.96 beats per minute. Other EKG findings include: None.  LEG MOVEMENT DATA The total Periodic Limb Movements of Sleep (PLMS) were 119. The PLMS index was 21.25. A PLMS index of <15 is considered normal in adults.  IMPRESSIONS - CPAP was started at 5 cwp and was titrated to 20 cwp before transitioning to BiPAP.  Snoring ultimately resolved at  BiPAP  24/19 cm water.  - Central sleep apnea was not noted during this titration (CAI = 0.0/h). - Severe oxygen desaturations were observed during this titration (min O2 = 56.00%). - The patient snored with moderate snoring volume. - No cardiac abnormalities were observed during this study. - Mild periodic limb movements were observed during this study. Arousals associated with PLMs were rare.  DIAGNOSIS - Obstructive Sleep Apnea (327.23 [G47.33 ICD-10])  RECOMMENDATIONS -  Recommend an initial trial of BiPAP therapy with BiFlex/EPR at 24/19 cm H2O with heated humidification.  A Medium size Resmed Full Face Mask AirFit F20 mask was used for the titration.  - Efforts should be made to optimize nasal and oropharyngeal patency. - Avoid alcohol, sedatives and other CNS depressants that may worsen sleep apnea and disrupt  normal sleep architecture. - Sleep hygiene should be reviewed to assess factors that may improve sleep quality. - Weight management and regular exercise should be initiated or continued. - Recommend a download be obtained in 30 days and sleep clinic evaluation after 4 weeks of therapy.  [Electronically signed] 01/06/2017 07:09 PM  Shelva Majestic MD, Tomasa Hose Diplomate, American Board of Sleep Medicine   NPI: [id number] [place] PH: [phone]   FX: [phone] [place]

## 2017-01-08 ENCOUNTER — Ambulatory Visit (INDEPENDENT_AMBULATORY_CARE_PROVIDER_SITE_OTHER): Payer: 59 | Admitting: Pharmacist

## 2017-01-08 DIAGNOSIS — I2699 Other pulmonary embolism without acute cor pulmonale: Secondary | ICD-10-CM | POA: Diagnosis not present

## 2017-01-08 DIAGNOSIS — Z5181 Encounter for therapeutic drug level monitoring: Secondary | ICD-10-CM

## 2017-01-08 DIAGNOSIS — Z7901 Long term (current) use of anticoagulants: Secondary | ICD-10-CM

## 2017-01-08 LAB — POCT INR: INR: 4.5

## 2017-01-09 NOTE — Telephone Encounter (Signed)
Patient states she is completely out of this medication and she has been waiting on an answer from [place] since 01/02/17. Can someone address this with English please.

## 2017-01-09 NOTE — Telephone Encounter (Signed)
[place], ok to fill this rx?

## 2017-01-10 NOTE — Telephone Encounter (Signed)
Looks like you filled it.  Thank you

## 2017-01-11 ENCOUNTER — Telehealth: Payer: Self-pay | Admitting: *Deleted

## 2017-01-11 NOTE — Telephone Encounter (Signed)
-----   Message from Troy Sine, MD sent at 01/06/2017  7:15 PM EDT ----- Angie Fava, please have DME expedite BiPAP set up and schedule for f/u sleep clinic. Thx

## 2017-01-11 NOTE — Telephone Encounter (Signed)
Attempt to call patient to discuss titration results-no answer, left detailed message (ok per DPR) making aware orders for Bipap therapy have been faxed to [facility].  Advised to call with any questions or if not contacted by [facility] in 2 weeks.

## 2017-01-11 NOTE — Progress Notes (Signed)
See telephone note 10/19

## 2017-01-18 ENCOUNTER — Telehealth: Payer: Self-pay | Admitting: Family Medicine

## 2017-01-18 NOTE — Telephone Encounter (Signed)
Out of medicine--Patient wants to know why she has to call in every month for a refill if she's on this medicine for life

## 2017-01-21 ENCOUNTER — Ambulatory Visit: Payer: 59 | Admitting: Cardiovascular Disease

## 2017-01-21 ENCOUNTER — Ambulatory Visit (INDEPENDENT_AMBULATORY_CARE_PROVIDER_SITE_OTHER): Payer: 59 | Admitting: Pharmacist

## 2017-01-21 ENCOUNTER — Encounter: Payer: 59 | Attending: Physician Assistant | Admitting: Registered"

## 2017-01-21 DIAGNOSIS — E118 Type 2 diabetes mellitus with unspecified complications: Secondary | ICD-10-CM | POA: Insufficient documentation

## 2017-01-21 DIAGNOSIS — Z5181 Encounter for therapeutic drug level monitoring: Secondary | ICD-10-CM | POA: Diagnosis not present

## 2017-01-21 DIAGNOSIS — I2699 Other pulmonary embolism without acute cor pulmonale: Secondary | ICD-10-CM

## 2017-01-21 DIAGNOSIS — Z7901 Long term (current) use of anticoagulants: Secondary | ICD-10-CM | POA: Diagnosis not present

## 2017-01-21 DIAGNOSIS — E1149 Type 2 diabetes mellitus with other diabetic neurological complication: Secondary | ICD-10-CM

## 2017-01-21 DIAGNOSIS — IMO0001 Reserved for inherently not codable concepts without codable children: Secondary | ICD-10-CM

## 2017-01-21 DIAGNOSIS — Z713 Dietary counseling and surveillance: Secondary | ICD-10-CM | POA: Diagnosis not present

## 2017-01-21 LAB — POCT INR: INR: 1.2

## 2017-01-21 MED ORDER — WARFARIN SODIUM 7.5 MG PO TABS: ORAL_TABLET | ORAL | 2 refills | Status: DC

## 2017-01-21 NOTE — Telephone Encounter (Signed)
Pt filled with coumadin clinic today. Just so your aware.

## 2017-01-21 NOTE — Progress Notes (Signed)
Diabetes Self-Management Education  Visit Type: Follow-up  Appt. Start Time: 0900 Appt. End Time: 1000  01/22/2017  [name], identified by name and date of birth, is a 41 y.o. female with a diagnosis of Diabetes: Type 2.   ASSESSMENT Patient continues to ambulate with a cane and appears to have difficultly walking. Pt states she has cut out soda.  As patient was leaving she states someone gave her a gift card to a store in the mall and she will probably stop at [employer].   Pt states she feels like she might be drinking too much water because she has a lot of water retention. 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 Pt states she plans to start taking the medication before she leaves work so it can start working and doesn't have to use the bathroom so much during the night.     Diabetes Self-Management Education - 01/21/17 0912      Visit Information   Visit Type Follow-up     Initial Visit   Diabetes Type Type 2   Are you taking your medications as prescribed? No  sees PCP today and will ask questions about meds     Dietary Intake   Breakfast sasauge, egg, cheese sandwich   Snack (morning) none OR fruit   Lunch burger mcdonalds, fry, sweet tea 12 oz   Snack (afternoon) none OR parfait   Dinner [employer] [place] [employer] (b-day) 1/2 steak, shrimp, bread, salad,    Snack (evening) none   Beverage(s) water, sweet tea, occassional soda     Exercise   Exercise Type ADL's     Patient Education   Nutrition management  Role of diet in the treatment of diabetes and the relationship between the three main macronutrients and blood glucose level;Food label reading, portion sizes and measuring food.;Carbohydrate counting   Physical activity and exercise  Role of exercise on diabetes management, blood pressure control and cardiac health.   Chronic complications Assessed and discussed foot care and prevention of  foot problems     Individualized Goals (developed by patient)   Nutrition General guidelines for healthy choices and portions discussed   Physical Activity Exercise 5-7 days per week     Outcomes   Expected Outcomes Demonstrated interest in learning. Expect positive outcomes   Future DMSE PRN   Program Status Completed     Subsequent Visit   Since your last visit have you continued or begun to take your medications as prescribed? No   Since your last visit have you had your blood pressure checked? Yes   Is your most recent blood pressure lower, unchanged, or higher since your last visit? Higher  was a little high    Individualized Plan for Diabetes Self-Management Training:  Learning Objective:  Patient will have a greater understanding of diabetes self-management. Patient education plan is to attend individual and/or group sessions per assessed needs and concerns.  Patient Instructions   Ask your doctor about shoes, Vitamin D, Iron, ways to reduce fluid retention.  If you continue to take metformin consider taking over-the-counter vitamin B12.  To reduce fluid retention consider reducing sodium and getting more movement. To reduce sodium intake, cooking more at home will help. Use flavoring tip sheet for low sodium ideas.  Aim to get 10 min of movement in the morning when getting children ready for school.  Use the Snack sheet for ideas for balanced  snacks  Around 50 grams of carbs per meal balanced with protein should be fine.  Aim to get adequate sleep each night.  Expected Outcomes:  Demonstrated interest in learning. Expect positive outcomes  Education material provided:  Sleep Hygiene Tips, Low-Sodium flavoring tips, Sodium role in processed foods, Living Well with Diabetes, Vitamin K and Coumadin.  If problems or questions, patient to contact team via:  Phone  Future DSME appointment: PRN

## 2017-01-21 NOTE — Telephone Encounter (Signed)
Spoke to patient in office this AM-aware orders have been sent to [facility] and they are awaiting insurance approval.  Aware DME company will be contacting her to get her set up for therapy.    Patient verbalized understanding.

## 2017-01-21 NOTE — Telephone Encounter (Signed)
Not sure if she is due for follow-up blood draw. Please advise.

## 2017-01-21 NOTE — Patient Instructions (Addendum)
   Ask your doctor about shoes, Vitamin D, Iron, ways to reduce fluid retention.  If you continue to take metformin consider taking over-the-counter vitamin B12.  To reduce fluid retention consider reducing sodium and getting more movement. To reduce sodium intake, cooking more at home will help. Use flavoring tip sheet for low sodium ideas.  Aim to get 10 min of movement in the morning when getting children ready for school.  Use the Snack sheet for ideas for balanced snacks  Around 50 grams of carbs per meal balanced with protein should be fine.  Aim to get adequate sleep each night.

## 2017-01-21 NOTE — Telephone Encounter (Signed)
I cancelled this order if you read the prescription.  I do not fill the coumadin.  This is the coumadin clinic.  Please contact pt and see what she is talking about.

## 2017-01-30 ENCOUNTER — Other Ambulatory Visit: Payer: Self-pay | Admitting: Physician Assistant

## 2017-01-30 ENCOUNTER — Other Ambulatory Visit: Payer: Self-pay | Admitting: Family Medicine

## 2017-01-30 DIAGNOSIS — R609 Edema, unspecified: Secondary | ICD-10-CM

## 2017-02-22 ENCOUNTER — Telehealth: Payer: Self-pay | Admitting: *Deleted

## 2017-02-22 NOTE — Telephone Encounter (Signed)
Per Anderson Malta at [facility] have attempted to contact patient multiple times without return call.   Will continue to attempt to reach patient to get set up for Bipap

## 2017-03-06 ENCOUNTER — Telehealth: Payer: Self-pay | Admitting: Family Medicine

## 2017-03-06 DIAGNOSIS — R609 Edema, unspecified: Secondary | ICD-10-CM

## 2017-03-07 ENCOUNTER — Other Ambulatory Visit: Payer: Self-pay | Admitting: *Deleted

## 2017-03-07 DIAGNOSIS — R609 Edema, unspecified: Secondary | ICD-10-CM

## 2017-03-07 MED ORDER — FUROSEMIDE 40 MG PO TABS: 40.0000 mg | ORAL_TABLET | Freq: Every day | ORAL | 0 refills | Status: DC

## 2017-03-14 NOTE — Telephone Encounter (Signed)
Please contact patient and advise that she should come in for b12 injections.  This will be monthly.

## 2017-04-02 ENCOUNTER — Telehealth: Payer: Self-pay | Admitting: Physician Assistant

## 2017-04-02 NOTE — Telephone Encounter (Signed)
Tried twice to call and ask for Alexa [name] to remind her of her appt tomorrow 04/03/16. Each time I asked for Alexa [name], I was hung up on.

## 2017-04-03 ENCOUNTER — Encounter: Payer: 59 | Admitting: Physician Assistant

## 2017-04-04 ENCOUNTER — Other Ambulatory Visit: Payer: Self-pay

## 2017-04-04 ENCOUNTER — Encounter: Payer: Self-pay | Admitting: Physician Assistant

## 2017-04-04 ENCOUNTER — Ambulatory Visit: Payer: 59 | Admitting: Physician Assistant

## 2017-04-04 ENCOUNTER — Ambulatory Visit (INDEPENDENT_AMBULATORY_CARE_PROVIDER_SITE_OTHER): Payer: 59 | Admitting: Pharmacist Clinician (PhC)/ Clinical Pharmacy Specialist

## 2017-04-04 VITALS — BP 136/83 | HR 98 | Temp 98.5°F | Resp 16 | Ht 64.0 in | Wt >= 6400 oz

## 2017-04-04 DIAGNOSIS — E538 Deficiency of other specified B group vitamins: Secondary | ICD-10-CM | POA: Diagnosis not present

## 2017-04-04 DIAGNOSIS — Z131 Encounter for screening for diabetes mellitus: Secondary | ICD-10-CM

## 2017-04-04 DIAGNOSIS — Z5181 Encounter for therapeutic drug level monitoring: Secondary | ICD-10-CM

## 2017-04-04 DIAGNOSIS — Z13228 Encounter for screening for other metabolic disorders: Secondary | ICD-10-CM | POA: Diagnosis not present

## 2017-04-04 DIAGNOSIS — Z1329 Encounter for screening for other suspected endocrine disorder: Secondary | ICD-10-CM | POA: Diagnosis not present

## 2017-04-04 DIAGNOSIS — Z13220 Encounter for screening for lipoid disorders: Secondary | ICD-10-CM | POA: Diagnosis not present

## 2017-04-04 DIAGNOSIS — I2699 Other pulmonary embolism without acute cor pulmonale: Secondary | ICD-10-CM | POA: Diagnosis not present

## 2017-04-04 DIAGNOSIS — R609 Edema, unspecified: Secondary | ICD-10-CM | POA: Diagnosis not present

## 2017-04-04 DIAGNOSIS — Z23 Encounter for immunization: Secondary | ICD-10-CM

## 2017-04-04 DIAGNOSIS — Z130 Encounter for screening for diseases of the blood and blood-forming organs and certain disorders involving the immune mechanism: Secondary | ICD-10-CM

## 2017-04-04 DIAGNOSIS — Z0000 Encounter for general adult medical examination without abnormal findings: Secondary | ICD-10-CM | POA: Diagnosis not present

## 2017-04-04 DIAGNOSIS — N920 Excessive and frequent menstruation with regular cycle: Secondary | ICD-10-CM

## 2017-04-04 DIAGNOSIS — Z124 Encounter for screening for malignant neoplasm of cervix: Secondary | ICD-10-CM

## 2017-04-04 DIAGNOSIS — Z7901 Long term (current) use of anticoagulants: Secondary | ICD-10-CM

## 2017-04-04 LAB — POCT URINALYSIS DIP (MANUAL ENTRY)
BILIRUBIN UA: NEGATIVE
Glucose, UA: NEGATIVE mg/dL
Ketones, POC UA: NEGATIVE mg/dL
LEUKOCYTES UA: NEGATIVE
NITRITE UA: NEGATIVE
PH UA: 6.5 (ref 5.0–8.0)
Spec Grav, UA: 1.02 (ref 1.010–1.025)
Urobilinogen, UA: 0.2 E.U./dL

## 2017-04-04 LAB — POCT INR: INR: 1.4

## 2017-04-04 MED ORDER — CYANOCOBALAMIN 1000 MCG/ML IJ SOLN
1000.0000 ug | INTRAMUSCULAR | Status: DC
  Administered 2017-04-04: 1000 ug via INTRAMUSCULAR

## 2017-04-04 NOTE — Progress Notes (Signed)
PRIMARY CARE AT [facility] [address] [place] [address] [phone]  Date:  04/04/2017   Name:  Alexa [name]   DOB:  07-22-1975   MRN:  [id number]  PCP:  Joretta Bachelor, PA    History of Present Illness:  Alexa [name] is a 42 y.o. female patient who presents to PCP with  Chief Complaint  Patient presents with  . Annual Exam    no pap/ pt states she has not had a period since sept and wants to know why  . Medication Refill    gabapentin,lasix    DIET: she is trying to eat more healthy.  Cutting down on potatoes.  She eats french fries and mashed potatoes, pastas, rices.  Cut down breads to fahita wraps.  She is trying lettuce wraps.  She will have a soda once per week.  .3 weeks.  She is eating fruits and vegetables.  She loves frozen grapes.   Wt Readings from Last 3 Encounters:  04/04/17 (!) 443 lb (200.9 kg)  12/18/16 (!) 457 lb 9.6 oz (207.6 kg)  12/11/16 (!) 442 lb 9.6 oz (200.8 kg)   BM: Regular.  No blood or black stool.   URINATION: no dysuria, hematuria, or frequency  SLEEP: not sleeping.   MENSES: she is spotting for the last 3 months without true periods.    Patient Active Problem List   Diagnosis Date Noted  . Personal history of venous thrombosis and embolism 10/06/2016  . Pelvic lymphadenopathy 10/06/2016  . Adenopathy 08/08/2016  . Aortic thrombus ([place]) 06/19/2016  . Chronic anticoagulation 06/19/2016  . Diastolic dysfunction 80/32/1224  . HCD (hypertensive cardiovascular disease) 06/19/2016  . Anticoagulated on Coumadin 06/04/2016  . Other acute pulmonary embolism without acute cor pulmonale ([place]) 06/04/2016  . Acute pulmonary embolus ([place]) 05/14/2016  . Acute respiratory failure with hypoxia ([place]) 05/14/2016  . Hypoxia   . Class 3 obesity due to excess calories with serious comorbidity and body mass index (BMI) greater than or equal to 70 in adult 04/15/2016  . Athlete's foot 10/08/2015  . 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 Benign essential HTN 06/22/2015  . 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 BMI 70 and over, adult ([place]) 06/22/2015  . Vitamin D deficiency 06/22/2015  . Iron deficiency anemia 06/22/2015    Past Medical History:  Diagnosis Date  . Gestational diabetes   . Hypertension     Past Surgical History:  Procedure Laterality Date  . CESAREAN SECTION      Social History   Tobacco Use  . Smoking status: Former Smoker    Types: Cigarettes  . Smokeless tobacco: Never Used  . Tobacco comment: only smokes when she drinks alcohol - 1 pack per week  Substance Use Topics  . Alcohol use: Yes    Alcohol/week: 2.4 - 3.6 oz    Types: 4 - 6 Standard drinks or equivalent per week    Comment: mixed drinks 3-4 times/weeks  . Drug use: No    Family History  Problem Relation Age of Onset  . Diabetes Maternal Grandmother   . Cancer Maternal Grandmother        breast cancer   . Hypertension Mother   . Cancer Maternal Aunt 55       breast cancer   . Cancer Maternal Aunt 55       breast cancer     Allergies  Allergen Reactions  . Penicillins Hives  Has patient had a PCN reaction causing immediate rash, facial/tongue/throat swelling, SOB or lightheadedness with hypotension: No Has patient had a PCN reaction causing severe rash involving mucus membranes or skin necrosis: No Has patient had a PCN reaction that required hospitalization No Has patient had a PCN reaction occurring within the last 10 years: No If all of the above answers are "NO", then may proceed with Cephalosporin use.    Medication list has been reviewed and updated.  Current Outpatient Medications on File Prior to Visit  Medication Sig Dispense Refill  . cetirizine (ZYRTEC) 10 MG tablet Take 1 tablet (10 mg total) by mouth daily. 30 tablet 1  . ferrous sulfate 325 (65 FE) MG tablet Take 325 mg by mouth daily with breakfast.     . furosemide (LASIX) 40 MG tablet Take  1 tablet (40 mg total) by mouth daily. 30 tablet 0  . gabapentin (NEURONTIN) 300 MG capsule take 1 capsule by mouth twice a day (START WITH BEDTIME ONLY, THEN INCREASE TO TWICE A DAY) 60 capsule 1  . glucose blood test strip ascensia  Meter/strips preferred Test daily Use as instructed Dx.dm 100 each 3  . Lancets (ONETOUCH ULTRASOFT) lancets Test daily Use as instructed dm2 100 each 12  . losartan (COZAAR) 100 MG tablet take 1 tablet by mouth once daily 90 tablet 0  . metFORMIN (GLUCOPHAGE) 500 MG tablet Take 1 tablet (500 mg total) by mouth 2 (two) times daily with a meal. 180 tablet 3  . nystatin (MYCOSTATIN/NYSTOP) powder Apply topically 4 (four) times daily. 60 g 1  . predniSONE (DELTASONE) 20 MG tablet Take 3 PO QAM x2days, 2 PO QAM x2days, 1 PO QAM x3days 13 tablet 0  . warfarin (COUMADIN) 7.5 MG tablet Take 1 and 1/2 tablets to 2 tablets daily as directed by coumadin clinic 60 tablet 2   No current facility-administered medications on file prior to visit.     ROS ROS otherwise unremarkable unless listed above.  Physical Examination: BP 136/83   Pulse 98   Temp 98.5 F (36.9 C) (Oral)   Resp 16   Ht '5\' 4"'$  (1.626 m)   Wt (!) 443 lb (200.9 kg)   LMP 12/03/2016 (LMP Unknown)   SpO2 92%   BMI 76.04 kg/m  Ideal Body Weight: Weight in (lb) to have BMI = 25: 145.3  Physical Exam  Constitutional: She is oriented to person, place, and time. She appears well-developed and well-nourished. No distress.  HENT:  Head: Normocephalic and atraumatic.  Right Ear: Tympanic membrane, external ear and ear canal normal.  Left Ear: Tympanic membrane, external ear and ear canal normal.  Nose: Right sinus exhibits no maxillary sinus tenderness and no frontal sinus tenderness. Left sinus exhibits no maxillary sinus tenderness and no frontal sinus tenderness.  Mouth/Throat: Oropharynx is clear and moist. No uvula swelling. No oropharyngeal exudate, posterior oropharyngeal edema or posterior  oropharyngeal erythema.  Eyes: Conjunctivae and EOM are normal. Pupils are equal, round, and reactive to light.  Neck: Normal range of motion. Neck supple. No thyromegaly present.  Cardiovascular: Normal rate, regular rhythm, normal heart sounds and intact distal pulses. Exam reveals no gallop, no distant heart sounds and no friction rub.  No murmur heard. Pulmonary/Chest: Effort normal and breath sounds normal. No respiratory distress. She has no decreased breath sounds. She has no wheezes. She has no rhonchi.  Abdominal: Soft. Bowel sounds are normal. She exhibits no distension and no mass. There is no tenderness.  Genitourinary: Pelvic  exam was performed with patient supine. There is no rash on the right labia. There is no rash on the left labia. Cervix exhibits no motion tenderness, no discharge and no friability. There is bleeding in the vagina.  Musculoskeletal: Normal range of motion. She exhibits no edema or tenderness.  Lymphadenopathy:       Head (right side): No submandibular, no tonsillar, no preauricular and no posterior auricular adenopathy present.       Head (left side): No submandibular, no tonsillar, no preauricular and no posterior auricular adenopathy present.    She has no cervical adenopathy.  Neurological: She is alert and oriented to person, place, and time. No cranial nerve deficit. She exhibits normal muscle tone. Coordination normal.  Skin: Skin is warm and dry. She is not diaphoretic.  Psychiatric: She has a normal mood and affect. Her behavior is normal.    .  Assessment and Plan: WILLIEMAE MURIEL is a 42 y.o. female who is here today for cc of  Chief Complaint  Patient presents with  . Annual Exam    no pap/ pt states she has not had a period since sept and wants to know why  . Medication Refill    gabapentin,lasix   Annual physical exam - Plan: CBC, CMP14+EGFR, TSH, Lipid panel, Hemoglobin A1c, POCT urinalysis dipstick, Pap IG w/ reflex to HPV when  ASC-U  Screening for thyroid disorder - Plan: TSH  Screening for deficiency anemia - Plan: CBC  Screening for metabolic disorder - Plan: CMP14+EGFR  Screening for lipid disorders - Plan: Lipid panel  Screening for diabetes mellitus - Plan: Hemoglobin A1c  Spotting - Plan: POCT urinalysis dipstick  Screening for cervical cancer - Plan: Pap IG w/ reflex to HPV when ASC-U  B12 deficiency - Plan: cyanocobalamin ((VITAMIN B-12)) injection 1,000 mcg  Need for influenza vaccination - Plan: Flu Vaccine QUAD 36+ mos IM  Ivar Drape, PA-C [facility] 1/14/20199:43 PM

## 2017-04-04 NOTE — Patient Instructions (Signed)
Description   Take 1.5 tablets daily except 2 tablets each Sunday, Tuesday, Thursday and Saturday. Repeat INR in 2 weeks

## 2017-04-04 NOTE — Patient Instructions (Addendum)
Please continue to practice your cutting out of carbohydrate.  This is doing well for you.   Keeping You Healthy  Get These Tests 1. Blood Pressure- Have your blood pressure checked once a year by your health care provider.  Normal blood pressure is 120/80. 2. Weight- Have your body mass index (BMI) calculated to screen for obesity.  BMI is measure of body fat based on height and weight.  You can also calculate your own BMI at GravelBags.it. 3. Cholesterol- Have your cholesterol checked every 5 years starting at age 44 then yearly starting at age 31. 23. Chlamydia, HIV, and other sexually transmitted diseases- Get screened every year until age 57, then within three months of each new sexual provider. 5. Pap Test - Every 1-5 years; discuss with your health care provider. 6. Mammogram- Every 1-2 years starting at age 73--50  Take these medicines  Calcium with Vitamin D-Your body needs 1200 mg of Calcium each day and [phone] IU of Vitamin D daily.  Your body can only absorb 500 mg of Calcium at a time so Calcium must be taken in 2 or 3 divided doses throughout the day.  Multivitamin with folic acid- Once daily if it is possible for you to become pregnant.  Get these Immunizations  Gardasil-Series of three doses; prevents HPV related illness such as genital warts and cervical cancer.  Menactra-Single dose; prevents meningitis.  Tetanus shot- Every 10 years.  Flu shot-Every year.  Take these steps 1. Do not smoke-Your healthcare provider can help you quit.  For tips on how to quit go to www.smokefree.gov or call 1-800 QUITNOW. 2. Be physically active- Exercise 5 days a week for at least 30 minutes.  If you are not already physically active, start slow and gradually work up to 30 minutes of moderate physical activity.  Examples of moderate activity include walking briskly, dancing, swimming, bicycling, etc. 3. Breast Cancer- A self breast exam every month is important for early  detection of breast cancer.  For more information and instruction on self breast exams, ask your healthcare provider or https://www.patel.info/. 4. Eat a healthy diet- Eat a variety of healthy foods such as fruits, vegetables, whole grains, low fat milk, low fat cheeses, yogurt, lean meats, poultry and fish, beans, nuts, tofu, etc.  For more information go to www. Thenutritionsource.org 5. Drink alcohol in moderation- Limit alcohol intake to one drink or less per day. Never drink and drive. 6. Depression- Your emotional health is as important as your physical health.  If you're feeling down or losing interest in things you normally enjoy please talk to your healthcare provider about being screened for depression. 7. Dental visit- Brush and floss your teeth twice daily; visit your dentist twice a year. 8. Eye doctor- Get an eye exam at least every 2 years. 9. Helmet use- Always wear a helmet when riding a bicycle, motorcycle, rollerblading or skateboarding. 11. Safe sex- If you may be exposed to sexually transmitted infections, use a condom. 11. Seat belts- Seat belts can save your live; always wear one. 12. Smoke/Carbon Monoxide detectors- These detectors need to be installed on the appropriate level of your home. Replace batteries at least once a year. 13. Skin cancer- When out in the sun please cover up and use sunscreen 15 SPF or higher. 14. Violence- If anyone is threatening or hurting you, please tell your healthcare provider.           IF you received an x-ray today, you will receive an  invoice from [facility]. Please contact [facility] at [phone] with questions or concerns regarding your invoice.   IF you received labwork today, you will receive an invoice from [facility]. Please contact [facility] at [phone] with questions or concerns regarding your invoice.   Our billing staff will not be able to assist you with questions regarding  bills from these companies.  You will be contacted with the lab results as soon as they are available. The fastest way to get your results is to activate your My Chart account. Instructions are located on the last page of this paperwork. If you have not heard from [place] regarding the results in 2 weeks, please contact this office.

## 2017-04-05 LAB — CMP14+EGFR
A/G RATIO: 1.2 (ref 1.2–2.2)
ALT: 13 IU/L (ref 0–32)
AST: 14 IU/L (ref 0–40)
Albumin: 4.1 g/dL (ref 3.5–5.5)
Alkaline Phosphatase: 55 IU/L (ref 39–117)
BILIRUBIN TOTAL: 0.5 mg/dL (ref 0.0–1.2)
BUN/Creatinine Ratio: 12 (ref 9–23)
BUN: 10 mg/dL (ref 6–20)
CHLORIDE: 97 mmol/L (ref 96–106)
CO2: 30 mmol/L — ABNORMAL HIGH (ref 20–29)
Calcium: 9.2 mg/dL (ref 8.7–10.2)
Creatinine, Ser: 0.86 mg/dL (ref 0.57–1.00)
GFR calc Af Amer: 98 mL/min/{1.73_m2} (ref >59)
GFR calc non Af Amer: 85 mL/min/{1.73_m2} (ref >59)
GLUCOSE: 93 mg/dL (ref 65–99)
Globulin, Total: 3.3 g/dL (ref 1.5–4.5)
POTASSIUM: 4.6 mmol/L (ref 3.5–5.2)
Sodium: 141 mmol/L (ref 134–144)
Total Protein: 7.4 g/dL (ref 6.0–8.5)

## 2017-04-05 LAB — CBC
Hematocrit: 45.8 % (ref 34.0–46.6)
Hemoglobin: 14.1 g/dL (ref 11.1–15.9)
MCH: 27.1 pg (ref 26.6–33.0)
MCHC: 30.8 g/dL — ABNORMAL LOW (ref 31.5–35.7)
MCV: 88 fL (ref 79–97)
PLATELETS: 358 10*3/uL (ref 150–379)
RBC: 5.2 x10E6/uL (ref 3.77–5.28)
RDW: 16.7 % — AB (ref 12.3–15.4)
WBC: 7.9 10*3/uL (ref 3.4–10.8)

## 2017-04-05 LAB — HEMOGLOBIN A1C
Est. average glucose Bld gHb Est-mCnc: 131 mg/dL
HEMOGLOBIN A1C: 6.2 % — AB (ref 4.8–5.6)

## 2017-04-05 LAB — PAP IG W/ RFLX HPV ASCU: PAP SMEAR COMMENT: 0

## 2017-04-05 LAB — LIPID PANEL
CHOL/HDL RATIO: 3.2 ratio (ref 0.0–4.4)
Cholesterol, Total: 139 mg/dL (ref 100–199)
HDL: 43 mg/dL (ref >39)
LDL Calculated: 79 mg/dL (ref 0–99)
TRIGLYCERIDES: 87 mg/dL (ref 0–149)
VLDL Cholesterol Cal: 17 mg/dL (ref 5–40)

## 2017-04-05 LAB — TSH: TSH: 2.54 u[IU]/mL (ref 0.450–4.500)

## 2017-04-08 MED ORDER — GABAPENTIN 300 MG PO CAPS: 300.0000 mg | ORAL_CAPSULE | Freq: Two times a day (BID) | ORAL | 1 refills | Status: DC

## 2017-04-08 MED ORDER — FUROSEMIDE 40 MG PO TABS: 40.0000 mg | ORAL_TABLET | Freq: Every day | ORAL | 1 refills | Status: DC

## 2017-04-29 ENCOUNTER — Other Ambulatory Visit: Payer: Self-pay | Admitting: Emergency Medicine

## 2017-04-29 DIAGNOSIS — Z7189 Other specified counseling: Secondary | ICD-10-CM

## 2017-04-30 ENCOUNTER — Ambulatory Visit (INDEPENDENT_AMBULATORY_CARE_PROVIDER_SITE_OTHER): Payer: 59 | Admitting: Pharmacist Clinician (PhC)/ Clinical Pharmacy Specialist

## 2017-04-30 DIAGNOSIS — I2699 Other pulmonary embolism without acute cor pulmonale: Secondary | ICD-10-CM | POA: Diagnosis not present

## 2017-04-30 DIAGNOSIS — Z7901 Long term (current) use of anticoagulants: Secondary | ICD-10-CM

## 2017-04-30 DIAGNOSIS — Z5181 Encounter for therapeutic drug level monitoring: Secondary | ICD-10-CM

## 2017-04-30 DIAGNOSIS — I82812 Embolism and thrombosis of superficial veins of left lower extremities: Secondary | ICD-10-CM

## 2017-04-30 LAB — POCT INR: INR: 2.7

## 2017-04-30 NOTE — Patient Instructions (Signed)
Description   Continue with 2 tablets daily except 1.5 tablets each Monday, Wednesday and Friday.  Repeat INR in 3 weeks

## 2017-05-24 ENCOUNTER — Inpatient Hospital Stay: Payer: 59 | Attending: Hematology

## 2017-05-24 ENCOUNTER — Ambulatory Visit (INDEPENDENT_AMBULATORY_CARE_PROVIDER_SITE_OTHER): Payer: 59 | Admitting: Pharmacist

## 2017-05-24 ENCOUNTER — Other Ambulatory Visit: Payer: Self-pay

## 2017-05-24 ENCOUNTER — Telehealth: Payer: Self-pay

## 2017-05-24 DIAGNOSIS — R590 Localized enlarged lymph nodes: Secondary | ICD-10-CM | POA: Diagnosis not present

## 2017-05-24 DIAGNOSIS — I2699 Other pulmonary embolism without acute cor pulmonale: Secondary | ICD-10-CM

## 2017-05-24 DIAGNOSIS — I10 Essential (primary) hypertension: Secondary | ICD-10-CM | POA: Insufficient documentation

## 2017-05-24 DIAGNOSIS — Z880 Allergy status to penicillin: Secondary | ICD-10-CM | POA: Diagnosis not present

## 2017-05-24 DIAGNOSIS — Z5181 Encounter for therapeutic drug level monitoring: Secondary | ICD-10-CM

## 2017-05-24 DIAGNOSIS — Z79899 Other long term (current) drug therapy: Secondary | ICD-10-CM | POA: Insufficient documentation

## 2017-05-24 DIAGNOSIS — E119 Type 2 diabetes mellitus without complications: Secondary | ICD-10-CM | POA: Insufficient documentation

## 2017-05-24 DIAGNOSIS — Z7901 Long term (current) use of anticoagulants: Secondary | ICD-10-CM | POA: Insufficient documentation

## 2017-05-24 DIAGNOSIS — F17210 Nicotine dependence, cigarettes, uncomplicated: Secondary | ICD-10-CM | POA: Diagnosis not present

## 2017-05-24 DIAGNOSIS — Z86711 Personal history of pulmonary embolism: Secondary | ICD-10-CM | POA: Insufficient documentation

## 2017-05-24 DIAGNOSIS — Z86718 Personal history of other venous thrombosis and embolism: Secondary | ICD-10-CM

## 2017-05-24 DIAGNOSIS — Z7984 Long term (current) use of oral hypoglycemic drugs: Secondary | ICD-10-CM | POA: Insufficient documentation

## 2017-05-24 LAB — CMP (CANCER CENTER ONLY)
ALBUMIN: 3.3 g/dL — AB (ref 3.5–5.0)
ALT: 21 U/L (ref 0–55)
AST: 24 U/L (ref 5–34)
Alkaline Phosphatase: 57 U/L (ref 40–150)
Anion gap: 9 (ref 3–11)
BILIRUBIN TOTAL: 0.5 mg/dL (ref 0.2–1.2)
BUN: 9 mg/dL (ref 7–26)
CO2: 31 mmol/L — ABNORMAL HIGH (ref 22–29)
Calcium: 8.9 mg/dL (ref 8.4–10.4)
Chloride: 99 mmol/L (ref 98–109)
Creatinine: 0.84 mg/dL (ref 0.60–1.10)
Glucose, Bld: 106 mg/dL (ref 70–140)
POTASSIUM: 4.5 mmol/L (ref 3.5–5.1)
Sodium: 139 mmol/L (ref 136–145)
TOTAL PROTEIN: 7.2 g/dL (ref 6.4–8.3)

## 2017-05-24 LAB — POCT INR: INR: 2.7

## 2017-05-24 NOTE — Telephone Encounter (Signed)
Called pt and left VM to inform her of her CT scan scheduled for March 11th at 11am. She is to arrive at [facility] at [phone] and go to registration. Nothing to eat or drink four hours prior and she needs to pick up contrast located at our scheduling department any time before then. She is to drink the 1st bottle at 9am and the 2nd bottle at 10am. Pt is to call back to confirm appointment.  Cyndia Bent RN

## 2017-05-31 ENCOUNTER — Ambulatory Visit: Payer: 59 | Admitting: Hematology

## 2017-06-01 ENCOUNTER — Other Ambulatory Visit: Payer: Self-pay | Admitting: Cardiovascular Disease

## 2017-06-01 DIAGNOSIS — I2699 Other pulmonary embolism without acute cor pulmonale: Secondary | ICD-10-CM

## 2017-06-01 DIAGNOSIS — Z7901 Long term (current) use of anticoagulants: Secondary | ICD-10-CM

## 2017-06-03 ENCOUNTER — Ambulatory Visit (HOSPITAL_COMMUNITY): Payer: 59

## 2017-06-04 NOTE — Progress Notes (Signed)
[facility]  [phone] [phone]  Clinic Follow Up Note   Patient Care Team: Joretta Bachelor, [place] as PCP - General (Physician Assistant) 06/05/2017  Referring physician: Joretta Bachelor, PA  CHIEF COMPLAINTS:  recurrent pulmonary embolisms  HISTORY OF PRESENTING ILLNESS:  Alexa [name] 42 y.o. female with past medical history of morbid obesity, diabetes, hypertension, who is here because of recurrent pulmonary embolisms. She was referred by her primary care physician.   She had unprovoked DVT several years ago, was previously on Xarelto for about 3 months.   She was hospitalized in February 2018 for about a week. She presented to 5 days a history of shortness breath, was found to be hypoxic on admission.She had CT scans that showed an embolus In the distal right pulmonary artery and segmental emboli in the right upper lobe in the right lower lobe segmental arteries. In addition, thrombus was noticed in the patient's descending thoracic aorta. Patient was started on heparin drip, and bridged over to Coumadin. She was discharged home with nasal cannula oxygen, but is now off oxygen. Her dyspnea has resolved. Her Coumadin has been monitored by [facility]. Denies any issues while taking Coumadin.   She denies any fever or night sweats, shortness of breath, stomach or chest pain.   She denies recent history of trauma,  long distance travel, dehydration, recent surgery, prolonged immobilization before her episode of PE. She does smoke occasionally, 1 pack a week. She had no prior history or diagnosis of cancer. Her age appropriate screening programs are up-to-date. She had prior surgeries before and never had perioperative thromboembolic events. The patient had been exposed to birth control pills or hormone replacement therapy. The patient had been pregnant before and denies history of peripartum thromboembolic event or history of recurrent  miscarriages. There is no family history of blood clots or miscarriages.  CURRENT THERAPY: Coumadin 10 mg Sunday, Monday, Wednesday, Friday and 7.5 mg on Tuesday, Thursday, Saturday.   INTERVAL HISTORY: Alexa [name] returns for follow up. She presents to the clinic today by herself. She reports she is doing well overall. She states she is trying to eat healthier by reducing her sugar intake. She states her Coumadin level has been 2.7 the last two times it was checked. She takes Coumadin 10 mg Sunday, Monday, Wednesday, Friday and 7.5 mg on Tuesday, Thursday, Saturday an dis compliant.    She notes to have a irregular period with periods of heavy bleeding and spotting for a long amount of time.   On review of systems, pt denies new pain, or any other complaints at this time. Pertinent positives are listed and detailed within the above HPI.     MEDICAL HISTORY:  Past Medical History:  Diagnosis Date  . Gestational diabetes   . Hypertension     SURGICAL HISTORY: Past Surgical History:  Procedure Laterality Date  . CESAREAN SECTION      SOCIAL HISTORY: Social History   Socioeconomic History  . Marital status: Divorced    Spouse name: n/a  . Number of children: 0  . Years of education: Not on file  . Highest education level: Not on file  Social Needs  . Financial resource strain: Not on file  . Food insecurity - worry: Not on file  . Food insecurity - inability: Not on file  . Transportation needs - medical: Not on file  . Transportation needs - non-medical: Not on file  Occupational History  . 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 Smoking status: Former Smoker    Types: Cigarettes  . Smokeless tobacco: Never Used  . Tobacco comment: only smokes when she drinks alcohol - 1 pack per week  Substance and Sexual Activity  . Alcohol use: Yes    Alcohol/week: 2.4 - 3.6 oz    Types: 4 - 6 Standard drinks or equivalent per week     Comment: mixed drinks 3-4 times/weeks  . Drug use: No  . Sexual activity: Not on file  Other Topics Concern  . Not on file  Social History Narrative   Her children live with her.   Ex-husband lives locally.    FAMILY HISTORY: Family History  Problem Relation Age of Onset  . Diabetes Maternal Grandmother   . Cancer Maternal Grandmother        breast cancer   . Hypertension Mother   . Cancer Maternal Aunt 55       breast cancer   . Cancer Maternal Aunt 55       breast cancer     ALLERGIES:  is allergic to penicillins.  MEDICATIONS:  Current Outpatient Medications  Medication Sig Dispense Refill  . cetirizine (ZYRTEC) 10 MG tablet Take 1 tablet (10 mg total) by mouth daily. 30 tablet 1  . ferrous sulfate 325 (65 FE) MG tablet Take 325 mg by mouth daily with breakfast.     . furosemide (LASIX) 40 MG tablet Take 1 tablet (40 mg total) by mouth daily. 90 tablet 1  . gabapentin (NEURONTIN) 300 MG capsule Take 1 capsule (300 mg total) by mouth 2 (two) times daily. 180 capsule 1  . glucose blood test strip ascensia  Meter/strips preferred Test daily Use as instructed Dx.dm 100 each 3  . Lancets (ONETOUCH ULTRASOFT) lancets Test daily Use as instructed dm2 100 each 12  . losartan (COZAAR) 100 MG tablet take 1 tablet by mouth once daily 90 tablet 0  . metFORMIN (GLUCOPHAGE) 500 MG tablet take 1 tablet by mouth twice a day with meals 180 tablet 3  . nystatin (MYCOSTATIN/NYSTOP) powder Apply topically 4 (four) times daily. 60 g 1  . predniSONE (DELTASONE) 20 MG tablet Take 3 PO QAM x2days, 2 PO QAM x2days, 1 PO QAM x3days 13 tablet 0  . warfarin (COUMADIN) 7.5 MG tablet take 1 and 1/2 tablet to 2 tablets once daily as directed BY COUMADIN CLINIC 180 tablet 0   Current Facility-Administered Medications  Medication Dose Route Frequency Provider Last Rate Last Dose  . cyanocobalamin ((VITAMIN B-12)) injection 1,000 mcg  1,000 mcg Intramuscular Q30 days English, [name] D, [place]   1,000 mcg  at 04/04/17 1625    REVIEW OF SYSTEMS:   Constitutional: Denies fevers, chills or abnormal night sweats (+) overweight Eyes: Denies blurriness of vision, double vision or watery eyes Ears, nose, mouth, throat, and face: Denies mucositis or sore throat Respiratory: Denies cough, dyspnea or wheezes Cardiovascular: Denies palpitation, chest discomfort or lower extremity swelling Gastrointestinal:  Denies nausea, heartburn or change in bowel habits Skin: Denies abnormal skin rashes Lymphatics: Denies new lymphadenopathy or easy bruising Neurological:Denies numbness, tingling or new weaknesses Behavioral/Psych: Mood is stable, no new changes  All other systems were reviewed with the patient and are negative.  PHYSICAL EXAMINATION: ECOG PERFORMANCE STATUS: 1 - Symptomatic but completely ambulatory  Vitals:   06/05/17 1209  BP: (!) 174/90  Pulse: (!) 106  Resp: 18  Temp: 98.6 F (37 C)  SpO2: 90%   Filed Weights   06/05/17 1209  Weight: (!) 449 lb 9.6 oz (203.9 kg)    GENERAL:alert, no distress and comfortable, morbidly obese female  SKIN: skin color, texture, turgor are normal, no rashes or significant lesions except a large skin tag at her upper posterior neck  EYES: normal, conjunctiva are pink and non-injected, sclera clear OROPHARYNX:no exudate, no erythema and lips, buccal mucosa, and tongue normal  NECK: supple, thyroid normal size, non-tender, without nodularity LYMPH:  no palpable lymphadenopathy in the cervical, axillary or inguinal LUNGS: clear to auscultation and percussion with normal breathing effort HEART: regular rate & rhythm and no murmurs and no lower extremity edema ABDOMEN:abdomen soft, non-tender and normal bowel sounds Musculoskeletal:no cyanosis of digits and no clubbing  PSYCH: alert & oriented x 3 with fluent speech NEURO: no focal motor/sensory deficits  LABORATORY DATA:  I have reviewed the data as listed Recent Results (from the past 2160 hour(s))   POCT INR     Status: None   Collection Time: 04/04/17  8:14 AM  Result Value Ref Range   INR 1.4   Pap IG w/ reflex to HPV when ASC-U     Status: None   Collection Time: 04/04/17  4:29 PM  Result Value Ref Range   DIAGNOSIS: Comment     Comment: NEGATIVE FOR INTRAEPITHELIAL LESION AND MALIGNANCY.   Specimen adequacy: Comment     Comment: Satisfactory for evaluation. No endocervical component is identified.   Clinician Provided ICD10 Comment     Comment: Z00.00 Z12.4    Performed by: Comment     Comment: Schkita Black, Cytotechnologist (ASCP)   PAP Smear Comment .    Note: Comment     Comment: The Pap smear is a screening test designed to aid in the detection of premalignant and malignant conditions of the uterine cervix.  It is not a diagnostic procedure and should not be used as the sole means of detecting cervical cancer.  Both false-positive and false-negative reports do occur.    Test Methodology Comment     Comment: This liquid based ThinPrep(R) pap test was screened with the use of an image guided system.    PAP Reflex Comment     Comment: The HPV DNA reflex criteria were not met with this specimen result therefore, no HPV testing was performed.   POCT urinalysis dipstick     Status: Abnormal   Collection Time: 04/04/17  4:42 PM  Result Value Ref Range   Color, UA yellow yellow   Clarity, UA cloudy (A) clear   Glucose, UA negative negative mg/dL   Bilirubin, UA negative negative   Ketones, POC UA negative negative mg/dL   Spec Grav, UA 1.020 1.010 - 1.025   Blood, UA large (A) negative   pH, UA 6.5 5.0 - 8.0   Protein Ur, POC trace (A) negative mg/dL   Urobilinogen, UA 0.2 0.2 or 1.0 E.U./dL   Nitrite, UA Negative Negative   Leukocytes, UA Negative Negative  CBC     Status: Abnormal   Collection Time: 04/04/17  5:17 PM  Result Value Ref Range   WBC 7.9 3.4 - 10.8 x10E3/uL   RBC 5.20 3.77 - 5.28 x10E6/uL   Hemoglobin 14.1 11.1 - 15.9 g/dL   Hematocrit 45.8  34.0 - 46.6 %   MCV 88 79 - 97 fL   MCH 27.1 26.6 - 33.0 pg   MCHC 30.8 (L) 31.5 - 35.7 g/dL   RDW 16.7 (H) 12.3 -  15.4 %   Platelets 358 150 - 379 x10E3/uL  CMP14+EGFR     Status: Abnormal   Collection Time: 04/04/17  5:17 PM  Result Value Ref Range   Glucose 93 65 - 99 mg/dL   BUN 10 6 - 20 mg/dL   Creatinine, Ser 0.86 0.57 - 1.00 mg/dL   GFR calc non Af Amer 85 >59 mL/min/1.73   GFR calc Af Amer 98 >59 mL/min/1.73   BUN/Creatinine Ratio 12 9 - 23   Sodium 141 134 - 144 mmol/L   Potassium 4.6 3.5 - 5.2 mmol/L   Chloride 97 96 - 106 mmol/L   CO2 30 (H) 20 - 29 mmol/L   Calcium 9.2 8.7 - 10.2 mg/dL   Total Protein 7.4 6.0 - 8.5 g/dL   Albumin 4.1 3.5 - 5.5 g/dL   Globulin, Total 3.3 1.5 - 4.5 g/dL   Albumin/Globulin Ratio 1.2 1.2 - 2.2   Bilirubin Total 0.5 0.0 - 1.2 mg/dL   Alkaline Phosphatase 55 39 - 117 IU/L   AST 14 0 - 40 IU/L   ALT 13 0 - 32 IU/L  TSH     Status: None   Collection Time: 04/04/17  5:17 PM  Result Value Ref Range   TSH 2.540 0.450 - 4.500 uIU/mL  Lipid panel     Status: None   Collection Time: 04/04/17  5:17 PM  Result Value Ref Range   Cholesterol, Total 139 100 - 199 mg/dL   Triglycerides 87 0 - 149 mg/dL   HDL 43 >39 mg/dL   VLDL Cholesterol Cal 17 5 - 40 mg/dL   LDL Calculated 79 0 - 99 mg/dL   Chol/HDL Ratio 3.2 0.0 - 4.4 ratio    Comment:                                   T. Chol/HDL Ratio                                             Men  Women                               1/2 Avg.Risk  3.4    3.3                                   Avg.Risk  5.0    4.4                                2X Avg.Risk  9.6    7.1                                3X Avg.Risk 23.4   11.0   Hemoglobin A1c     Status: Abnormal   Collection Time: 04/04/17  5:17 PM  Result Value Ref Range   Hgb A1c MFr Bld 6.2 (H) 4.8 - 5.6 %    Comment:          Prediabetes: 5.7 - 6.4          Diabetes: >6.4  Glycemic control for adults with diabetes: <7.0    Est. average  glucose Bld gHb Est-mCnc 131 mg/dL  POCT INR     Status: None   Collection Time: 04/30/17  8:08 AM  Result Value Ref Range   INR 2.7   CMP (Cancer Center only)     Status: Abnormal   Collection Time: 05/24/17 11:17 AM  Result Value Ref Range   Sodium 139 136 - 145 mmol/L   Potassium 4.5 3.5 - 5.1 mmol/L   Chloride 99 98 - 109 mmol/L   CO2 31 (H) 22 - 29 mmol/L   Glucose, Bld 106 70 - 140 mg/dL   BUN 9 7 - 26 mg/dL   Creatinine 0.84 0.60 - 1.10 mg/dL   Calcium 8.9 8.4 - 10.4 mg/dL   Total Protein 7.2 6.4 - 8.3 g/dL   Albumin 3.3 (L) 3.5 - 5.0 g/dL   AST 24 5 - 34 U/L   ALT 21 0 - 55 U/L   Alkaline Phosphatase 57 40 - 150 U/L   Total Bilirubin 0.5 0.2 - 1.2 mg/dL   GFR, Est Non Af Am >60 >60 mL/min   GFR, Est AFR Am >60 >60 mL/min    Comment: (NOTE) The eGFR has been calculated using the CKD EPI equation. This calculation has not been validated in all clinical situations. eGFR's persistently <60 mL/min signify possible Chronic Kidney Disease.    Anion gap 9 3 - 11    Comment: Performed at [facility], [address] [place], [place] [address]  POCT INR     Status: None   Collection Time: 05/24/17  4:24 PM  Result Value Ref Range   INR 2.7     RADIOGRAPHIC STUDIES:  08/22/2016 CT Angio Andomen/Pelvis w & w/o contrast IMPRESSION: Aortic thrombus within the descending thoracic aorta and proximal abdominal aorta has resolved. There may be minimal residual mural thrombus but no significant atherosclerotic disease. Stable pelvic lymphadenopathy of unknown etiology. Findings could be associated with an indolent lymphoproliferative process. Consider hematology/oncology consultation. Main visceral arteries are patent but limited evaluation beyond the proximal aspect of the visceral arteries. Limited evaluation of the iliac arteries and proximal femoral arteries due to the timing of the study. Question splenic infarcts. Pulmonary emboli have  resolved. I have personally reviewed the radiological images as listed and agreed with the findings in the report.  No results found.  ASSESSMENT: 42 y.o.  female with morbid obesity, diabetes, hypertension, history of unprovoked DVT, presented with unprovoked PE.  1. 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 -Se has been tested negative for prothrombin gene mutation and factor V Leiden which were not interfered by his anticoagulation. Her lupus anticoagulant was also negative. -She will continue to follow-up her Coumadin level at the heart clinic. -We previously discussed the role of screening other family members for thrombophilia disorder. At present time, I would not recommend testing the patient's family members as it would not benefit them. -I recommended the patient to use elastic compression stockings at 20-30 mmHg to reduce risks of chronic thrombophlebitis. -I again reinforced the importance of preventive strategies such as avoiding hormonal supplement, avoiding cigarette smoking, keeping up-to-date with screening programs for early cancer detection, frequent ambulation for long distance travel and aggressive DVT prophylaxis in all surgical settings. -She currently is taking Coumadin 10 mg Sunday, Monday, Wednesday, Friday  and 7.5 mg on Tuesday, Thursday, Saturday with therapeutic INR. She denies symptoms of SOB today.  -She will have a CT scan this month.   -If her CT scan is unremarkable, she will follow-up with her primary care physician for Coumadin and monitoring.  I will see her as needed in the future.  2. Abdominal Adenopathy -She had a CT scan, which showed mild adenopathy in pelvis, exam was negative for peripheral adenopathy, she does not have B symptoms, my concern for lymphoproliferative disorder is not very high. However it  will be reasonable to repeat a scan in 6 months to follow her adenopathy.  - will repeat scan this month.  -If normal we can follow up as needed but she can continue to have her Coumadin level monitored.   2. HTN/D, morbid obesity - monitored by PCP   Plan and follow up: -I recommend her to continue Coumadin indefinitely, we will follow-up with her primary care physician for PNT/INR monitoring -CT scan this month, I will call her after her CT scan -I will see her as needed if CT scan is unremarkable.  All questions were answered. The patient knows to call the clinic with any problems, questions or concerns. I spent 10 minutes counseling the patient face to face. The total time spent in the appointment was 15 minutes and more than 50% was on counseling.  This document serves as a record of services personally performed by Truitt Merle, MD. It was created on her behalf by Theresia Bough, a trained medical scribe. The creation of this record is based on the scribe's personal observations and the provider's statements to them.   I have reviewed the above documentation for accuracy and completeness, and I agree with the above.     Truitt Merle, MD 06/05/2017

## 2017-06-05 ENCOUNTER — Encounter: Payer: Self-pay | Admitting: Hematology

## 2017-06-05 ENCOUNTER — Inpatient Hospital Stay (HOSPITAL_BASED_OUTPATIENT_CLINIC_OR_DEPARTMENT_OTHER): Payer: 59 | Admitting: Hematology

## 2017-06-05 VITALS — BP 174/90 | HR 106 | Temp 98.6°F | Resp 18 | Ht 64.0 in | Wt >= 6400 oz

## 2017-06-05 DIAGNOSIS — R590 Localized enlarged lymph nodes: Secondary | ICD-10-CM | POA: Diagnosis not present

## 2017-06-05 DIAGNOSIS — I2699 Other pulmonary embolism without acute cor pulmonale: Secondary | ICD-10-CM | POA: Diagnosis not present

## 2017-06-05 DIAGNOSIS — Z7901 Long term (current) use of anticoagulants: Secondary | ICD-10-CM | POA: Diagnosis not present

## 2017-06-05 DIAGNOSIS — Z86711 Personal history of pulmonary embolism: Secondary | ICD-10-CM | POA: Diagnosis not present

## 2017-06-05 DIAGNOSIS — Z86718 Personal history of other venous thrombosis and embolism: Secondary | ICD-10-CM | POA: Diagnosis not present

## 2017-06-07 ENCOUNTER — Telehealth: Payer: Self-pay | Admitting: Pharmacist Clinician (PhC)/ Clinical Pharmacy Specialist

## 2017-06-07 DIAGNOSIS — I2699 Other pulmonary embolism without acute cor pulmonale: Secondary | ICD-10-CM

## 2017-06-07 DIAGNOSIS — Z7901 Long term (current) use of anticoagulants: Secondary | ICD-10-CM

## 2017-06-07 MED ORDER — WARFARIN SODIUM 7.5 MG PO TABS: ORAL_TABLET | ORAL | 0 refills | Status: DC

## 2017-06-07 NOTE — Telephone Encounter (Signed)
°*  STAT* If patient is at the pharmacy, call can be transferred to refill team.   1. Which medications need to be refilled? (please list name of each medication and dose if known) warfarin 7.5 mg  2. Which pharmacy/location (including street and city if local pharmacy) is medication to be sent to? [facility], [place] [address]  3. Do they need a 30 day or 90 day supply? [place]

## 2017-06-08 ENCOUNTER — Other Ambulatory Visit: Payer: Self-pay | Admitting: Family Medicine

## 2017-06-10 ENCOUNTER — Other Ambulatory Visit: Payer: Self-pay

## 2017-06-10 ENCOUNTER — Ambulatory Visit: Payer: 59 | Admitting: Physician Assistant

## 2017-06-10 ENCOUNTER — Encounter (HOSPITAL_COMMUNITY): Payer: Self-pay

## 2017-06-10 ENCOUNTER — Emergency Department (HOSPITAL_COMMUNITY): Payer: 59

## 2017-06-10 ENCOUNTER — Ambulatory Visit: Payer: Self-pay | Admitting: *Deleted

## 2017-06-10 ENCOUNTER — Inpatient Hospital Stay (HOSPITAL_COMMUNITY)
Admission: EM | Admit: 2017-06-10 | Discharge: 2017-06-15 | DRG: 292 | Disposition: A | Discharge status: Home / Self Care | Payer: 59 | Attending: Internal Medicine | Admitting: Internal Medicine

## 2017-06-10 DIAGNOSIS — I5021 Acute systolic (congestive) heart failure: Secondary | ICD-10-CM

## 2017-06-10 DIAGNOSIS — Z7984 Long term (current) use of oral hypoglycemic drugs: Secondary | ICD-10-CM | POA: Diagnosis not present

## 2017-06-10 DIAGNOSIS — Z79899 Other long term (current) drug therapy: Secondary | ICD-10-CM

## 2017-06-10 DIAGNOSIS — Z6841 Body Mass Index (BMI) 40.0 and over, adult: Secondary | ICD-10-CM | POA: Diagnosis not present

## 2017-06-10 DIAGNOSIS — I110 Hypertensive heart disease with heart failure: Principal | ICD-10-CM | POA: Diagnosis present

## 2017-06-10 DIAGNOSIS — R05 Cough: Secondary | ICD-10-CM | POA: Diagnosis not present

## 2017-06-10 DIAGNOSIS — I272 Pulmonary hypertension, unspecified: Secondary | ICD-10-CM | POA: Diagnosis present

## 2017-06-10 DIAGNOSIS — Z86718 Personal history of other venous thrombosis and embolism: Secondary | ICD-10-CM | POA: Diagnosis not present

## 2017-06-10 DIAGNOSIS — E119 Type 2 diabetes mellitus without complications: Secondary | ICD-10-CM | POA: Diagnosis present

## 2017-06-10 DIAGNOSIS — I5033 Acute on chronic diastolic (congestive) heart failure: Secondary | ICD-10-CM | POA: Diagnosis not present

## 2017-06-10 DIAGNOSIS — J9601 Acute respiratory failure with hypoxia: Secondary | ICD-10-CM | POA: Diagnosis not present

## 2017-06-10 DIAGNOSIS — Z86711 Personal history of pulmonary embolism: Secondary | ICD-10-CM

## 2017-06-10 DIAGNOSIS — I361 Nonrheumatic tricuspid (valve) insufficiency: Secondary | ICD-10-CM | POA: Diagnosis present

## 2017-06-10 DIAGNOSIS — M79609 Pain in unspecified limb: Secondary | ICD-10-CM | POA: Diagnosis not present

## 2017-06-10 DIAGNOSIS — G4733 Obstructive sleep apnea (adult) (pediatric): Secondary | ICD-10-CM | POA: Diagnosis present

## 2017-06-10 DIAGNOSIS — I509 Heart failure, unspecified: Secondary | ICD-10-CM

## 2017-06-10 DIAGNOSIS — R791 Abnormal coagulation profile: Secondary | ICD-10-CM | POA: Diagnosis present

## 2017-06-10 DIAGNOSIS — Z880 Allergy status to penicillin: Secondary | ICD-10-CM | POA: Diagnosis not present

## 2017-06-10 DIAGNOSIS — Z87891 Personal history of nicotine dependence: Secondary | ICD-10-CM

## 2017-06-10 DIAGNOSIS — I5031 Acute diastolic (congestive) heart failure: Secondary | ICD-10-CM

## 2017-06-10 DIAGNOSIS — Z8249 Family history of ischemic heart disease and other diseases of the circulatory system: Secondary | ICD-10-CM | POA: Diagnosis not present

## 2017-06-10 DIAGNOSIS — Z7901 Long term (current) use of anticoagulants: Secondary | ICD-10-CM | POA: Diagnosis not present

## 2017-06-10 LAB — BASIC METABOLIC PANEL
ANION GAP: 7 (ref 5–15)
BUN: 12 mg/dL (ref 6–20)
CHLORIDE: 100 mmol/L — AB (ref 101–111)
CO2: 33 mmol/L — ABNORMAL HIGH (ref 22–32)
CREATININE: 0.89 mg/dL (ref 0.44–1.00)
Calcium: 8.5 mg/dL — ABNORMAL LOW (ref 8.9–10.3)
GFR calc non Af Amer: >60 mL/min (ref >60)
Glucose, Bld: 105 mg/dL — ABNORMAL HIGH (ref 65–99)
Potassium: 4.6 mmol/L (ref 3.5–5.1)
SODIUM: 140 mmol/L (ref 135–145)

## 2017-06-10 LAB — CBC WITH DIFFERENTIAL/PLATELET
Basophils Absolute: 0 10*3/uL (ref 0.0–0.1)
Basophils Relative: 0 %
EOS ABS: 0 10*3/uL (ref 0.0–0.7)
Eosinophils Relative: 0 %
HCT: 46 % (ref 36.0–46.0)
HEMOGLOBIN: 13.4 g/dL (ref 12.0–15.0)
LYMPHS ABS: 1.3 10*3/uL (ref 0.7–4.0)
Lymphocytes Relative: 17 %
MCH: 26.3 pg (ref 26.0–34.0)
MCHC: 29.1 g/dL — ABNORMAL LOW (ref 30.0–36.0)
MCV: 90.2 fL (ref 78.0–100.0)
Monocytes Absolute: 1 10*3/uL (ref 0.1–1.0)
Monocytes Relative: 13 %
NEUTROS PCT: 70 %
Neutro Abs: 5.5 10*3/uL (ref 1.7–7.7)
Platelets: 266 10*3/uL (ref 150–400)
RBC: 5.1 MIL/uL (ref 3.87–5.11)
RDW: 19.3 % — ABNORMAL HIGH (ref 11.5–15.5)
WBC: 7.8 10*3/uL (ref 4.0–10.5)

## 2017-06-10 LAB — HEMOGLOBIN A1C
Hgb A1c MFr Bld: 5.7 % — ABNORMAL HIGH (ref 4.8–5.6)
Mean Plasma Glucose: 116.89 mg/dL

## 2017-06-10 LAB — BRAIN NATRIURETIC PEPTIDE: B NATRIURETIC PEPTIDE 5: 177 pg/mL — AB (ref 0.0–100.0)

## 2017-06-10 LAB — APTT: aPTT: 34 seconds (ref 24–36)

## 2017-06-10 LAB — GLUCOSE, CAPILLARY
GLUCOSE-CAPILLARY: 113 mg/dL — AB (ref 65–99)
Glucose-Capillary: 109 mg/dL — ABNORMAL HIGH (ref 65–99)
Glucose-Capillary: 102 mg/dL — ABNORMAL HIGH (ref 65–99)

## 2017-06-10 LAB — PROTIME-INR
INR: 1.46
PROTHROMBIN TIME: 17.6 s — AB (ref 11.4–15.2)

## 2017-06-10 MED ORDER — WARFARIN SODIUM 5 MG PO TABS
17.5000 mg | ORAL_TABLET | Freq: Once | ORAL | Status: AC
  Administered 2017-06-10: 17.5 mg via ORAL
  Filled 2017-06-10: qty 1

## 2017-06-10 MED ORDER — SODIUM CHLORIDE 0.9 % IV SOLN: 250.0000 mL | INTRAVENOUS | Status: DC | PRN

## 2017-06-10 MED ORDER — CHLORHEXIDINE GLUCONATE 0.12 % MT SOLN
15.0000 mL | Freq: Two times a day (BID) | OROMUCOSAL | Status: DC
  Administered 2017-06-10 – 2017-06-15 (×7): 15 mL via OROMUCOSAL
  Filled 2017-06-10 (×7): qty 15

## 2017-06-10 MED ORDER — ENOXAPARIN SODIUM 300 MG/3ML IJ SOLN
200.0000 mg | Freq: Two times a day (BID) | INTRAMUSCULAR | Status: DC
  Administered 2017-06-11 – 2017-06-15 (×9): 200 mg via SUBCUTANEOUS
  Filled 2017-06-10 (×11): qty 2

## 2017-06-10 MED ORDER — ONDANSETRON HCL 4 MG/2ML IJ SOLN: 4.0000 mg | Freq: Four times a day (QID) | INTRAMUSCULAR | Status: DC | PRN

## 2017-06-10 MED ORDER — ORAL CARE MOUTH RINSE
15.0000 mL | Freq: Two times a day (BID) | OROMUCOSAL | Status: DC
  Administered 2017-06-10 – 2017-06-15 (×3): 15 mL via OROMUCOSAL

## 2017-06-10 MED ORDER — ENOXAPARIN SODIUM 300 MG/3ML IJ SOLN
200.0000 mg | Freq: Once | INTRAMUSCULAR | Status: AC
  Administered 2017-06-10: 200 mg via SUBCUTANEOUS
  Filled 2017-06-10: qty 2

## 2017-06-10 MED ORDER — GABAPENTIN 300 MG PO CAPS
300.0000 mg | ORAL_CAPSULE | Freq: Two times a day (BID) | ORAL | Status: DC
Start: 2017-06-10 — End: 2017-06-15
  Administered 2017-06-10 – 2017-06-15 (×10): 300 mg via ORAL
  Filled 2017-06-10 (×10): qty 1

## 2017-06-10 MED ORDER — FUROSEMIDE 10 MG/ML IJ SOLN
40.0000 mg | Freq: Once | INTRAMUSCULAR | Status: AC
  Administered 2017-06-10: 40 mg via INTRAVENOUS
  Filled 2017-06-10: qty 4

## 2017-06-10 MED ORDER — WARFARIN - PHARMACIST DOSING INPATIENT: Freq: Every day | Status: DC

## 2017-06-10 MED ORDER — SODIUM CHLORIDE 0.9% FLUSH: 3.0000 mL | INTRAVENOUS | Status: DC | PRN

## 2017-06-10 MED ORDER — FUROSEMIDE 10 MG/ML IJ SOLN
40.0000 mg | Freq: Two times a day (BID) | INTRAMUSCULAR | Status: DC
  Administered 2017-06-10 – 2017-06-15 (×10): 40 mg via INTRAVENOUS
  Filled 2017-06-10 (×11): qty 4

## 2017-06-10 MED ORDER — ACETAMINOPHEN 325 MG PO TABS
650.0000 mg | ORAL_TABLET | ORAL | Status: DC | PRN
  Administered 2017-06-11: 650 mg via ORAL
  Filled 2017-06-10: qty 2

## 2017-06-10 MED ORDER — SODIUM CHLORIDE 0.9% FLUSH
3.0000 mL | Freq: Two times a day (BID) | INTRAVENOUS | Status: DC
Start: 2017-06-10 — End: 2017-06-15
  Administered 2017-06-10 – 2017-06-15 (×11): 3 mL via INTRAVENOUS

## 2017-06-10 MED ORDER — LOSARTAN POTASSIUM 50 MG PO TABS
100.0000 mg | ORAL_TABLET | Freq: Every day | ORAL | Status: DC
  Administered 2017-06-11 – 2017-06-15 (×5): 100 mg via ORAL
  Filled 2017-06-10 (×5): qty 2

## 2017-06-10 MED ORDER — INSULIN ASPART 100 UNIT/ML ~~LOC~~ SOLN: 0.0000 [IU] | Freq: Three times a day (TID) | SUBCUTANEOUS | Status: DC

## 2017-06-10 NOTE — ED Notes (Signed)
Pt's oxygen dropped to 76% on rm air while ambulating.

## 2017-06-10 NOTE — Telephone Encounter (Signed)
Called in c/o having a cough for 3-4 weeks that is getting worse.   She is also c/o "soreness" in her left leg between the calf and ankle area.   She has a history of having a blood clot in her heart and lung last year and is on Coumadin for it.    See triage notes below.  I have referred her to the ED due to her history.  I have cancelled her appt with Dr. Cleophus Molt this morning but have routed her a note making her aware of the ED referral.    Reason for Disposition . Chest pain  (Exception: MILD central chest pain, present only when coughing)  Answer Assessment - Initial Assessment Questions 1. ONSET: "When did the cough begin?"      I had it 3-4 weeks.    2. SEVERITY: "How bad is the cough today?"      I hurt when I cough.   My right upper chest and upper back and lower back. 3. RESPIRATORY DISTRESS: "Describe your breathing."      Yes   I feel short of breath at rest and up and doing things. 4. FEVER: "Do you have a fever?" If so, ask: "What is your temperature, how was it measured, and when did it start?"     I don't feel like it.   I did in the past but not this morning. 5. SPUTUM: "Describe the color of your sputum" (clear, white, yellow, green)     Clear to light green sputum 6. HEMOPTYSIS: "Are you coughing up any blood?" If so ask: "How much?" (flecks, streaks, tablespoons, etc.)     No blood 7. CARDIAC HISTORY: "Do you have any history of heart disease?" (e.g., heart attack, congestive heart failure)      I had a blood clot in my heart and your lung last year. 8. LUNG HISTORY: "Do you have any history of lung disease?"  (e.g., pulmonary embolus, asthma, emphysema)     I had a blood clot in my heart and my lung.   I have soreness in my left leg between my calf and ankle.    It's warm.  I've been putting ice packs on it.   It's been sore for 2 days.    No injury to my left ankle.    No throbbing or aching it's just sore. 9. PE RISK FACTORS: "Do you have a history of blood clots?" (or:  recent major surgery, recent prolonged travel, bedridden )     Yes see above. 10. OTHER SYMPTOMS: "Do you have any other symptoms?" (e.g., runny nose, wheezing, chest pain)       Shortness of breath I had when I had the blood clot in my lung last year.  I'm taking Warfarin. 11. PREGNANCY: "Is there any chance you are pregnant?" "When was your last menstrual period?"       No.  Period a week ago. 12. 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   I had a bad cold and my whole family too.   My cough was getting better but now it's getting longer and deeper.   My mom and some kids in my class have had pneumonia and did not know it.  Protocols used: COUGH - ACUTE PRODUCTIVE-A-AH

## 2017-06-10 NOTE — ED Notes (Signed)
ED TO INPATIENT HANDOFF REPORT  Name/Age/Gender Alexa [name] 42 y.o. female  Code Status Code Status History    Date Active Date Inactive Code Status Order ID Comments User Context   05/14/2016 16:05 05/22/2016 22:21 Full Code [id number]  Orson Eva, MD ED      Home/SNF/Other Home  Chief Complaint SOB  Level of Care/Admitting Diagnosis ED Disposition    ED Disposition Condition Benjamin Perez: [facility] [[id number]]  Level of Care: Telemetry [5]  Admit to tele based on following criteria: Acute CHF  Diagnosis: Acute CHF (congestive heart failure) [facility]) [[id number]]  Admitting Physician: Hosie Poisson [[id number]]  Attending Physician: Hosie Poisson [[id number]]  Estimated length of stay: past midnight tomorrow  Certification:: I certify this patient will need inpatient services for at least 2 midnights  PT Class (Do Not Modify): Inpatient [101]  PT Acc Code (Do Not Modify): Private [1]       Medical History Past Medical History:  Diagnosis Date  . Gestational diabetes   . Hypertension     Allergies Allergies  Allergen Reactions  . Penicillins Hives    Has patient had a PCN reaction causing immediate rash, facial/tongue/throat swelling, SOB or lightheadedness with hypotension: No Has patient had a PCN reaction causing severe rash involving mucus membranes or skin necrosis: No Has patient had a PCN reaction that required hospitalization No Has patient had a PCN reaction occurring within the last 10 years: No If all of the above answers are "NO", then may proceed with Cephalosporin use.    IV Location/Drains/Wounds Patient Lines/Drains/Airways Status   Active Line/Drains/Airways    Name:   Placement date:   Placement time:   Site:   Days:   Peripheral IV 06/10/17 Right Antecubital   06/10/17    0944    Antecubital   less than 1          Labs/Imaging Results for orders placed or performed during the hospital encounter of 06/10/17 (from  the past 48 hour(s))  Basic metabolic panel     Status: Abnormal   Collection Time: 06/10/17  9:45 AM  Result Value Ref Range   Sodium 140 135 - 145 mmol/L   Potassium 4.6 3.5 - 5.1 mmol/L   Chloride 100 (L) 101 - 111 mmol/L   CO2 33 (H) 22 - 32 mmol/L   Glucose, Bld 105 (H) 65 - 99 mg/dL   BUN 12 6 - 20 mg/dL   Creatinine, Ser 0.89 0.44 - 1.00 mg/dL   Calcium 8.5 (L) 8.9 - 10.3 mg/dL   GFR calc non Af Amer >60 >60 mL/min   GFR calc Af Amer >60 >60 mL/min    Comment: (NOTE) The eGFR has been calculated using the CKD EPI equation. This calculation has not been validated in all clinical situations. eGFR's persistently <60 mL/min signify possible Chronic Kidney Disease.    Anion gap 7 5 - 15    Comment: Performed at [facility], [place] [address] [place], [place] [address]  CBC with Differential     Status: Abnormal   Collection Time: 06/10/17  9:45 AM  Result Value Ref Range   WBC 7.8 4.0 - 10.5 K/uL   RBC 5.10 3.87 - 5.11 MIL/uL   Hemoglobin 13.4 12.0 - 15.0 g/dL   HCT 46.0 36.0 - 46.0 %   MCV 90.2 78.0 - 100.0 fL   MCH 26.3 26.0 - 34.0 pg   MCHC 29.1 (L) 30.0 - 36.0  g/dL   RDW 19.3 (H) 11.5 - 15.5 %   Platelets 266 150 - 400 K/uL   Neutrophils Relative % 70 %   Neutro Abs 5.5 1.7 - 7.7 K/uL   Lymphocytes Relative 17 %   Lymphs Abs 1.3 0.7 - 4.0 K/uL   Monocytes Relative 13 %   Monocytes Absolute 1.0 0.1 - 1.0 K/uL   Eosinophils Relative 0 %   Eosinophils Absolute 0.0 0.0 - 0.7 K/uL   Basophils Relative 0 %   Basophils Absolute 0.0 0.0 - 0.1 K/uL    Comment: Performed at [facility], [place] [address], [place], [place] [address]  Protime-INR     Status: Abnormal   Collection Time: 06/10/17  9:45 AM  Result Value Ref Range   Prothrombin Time 17.6 (H) 11.4 - 15.2 seconds   INR 1.46     Comment: Performed at [facility], [place] [address] [place], [place] [address]  APTT     Status: None   Collection Time: 06/10/17   9:45 AM  Result Value Ref Range   aPTT 34 24 - 36 seconds    Comment: Performed at [facility], [place] [address] [place],  [place] [address]  Brain natriuretic peptide     Status: Abnormal   Collection Time: 06/10/17  9:46 AM  Result Value Ref Range   B Natriuretic Peptide 177.0 (H) 0.0 - 100.0 pg/mL    Comment: Performed at [facility], [place] [address], [place], [place] [address]   Dg Chest 2 View  Result Date: 06/10/2017 CLINICAL DATA:  Cough EXAM: CHEST - 2 VIEW COMPARISON:  05/18/2016 FINDINGS: Cardiac enlargement with vascular congestion. Negative for edema or effusion. Negative for pneumonia IMPRESSION: Cardiomegaly with vascular congestion.  Negative for edema Electronically Signed   By: Franchot Gallo M.D.   On: 06/10/2017 10:13    Pending Labs Unresulted Labs (From admission, onward)   Start     Ordered   06/10/17 1237  Hemoglobin A1c  Add-on,   R    Comments:  To assess prior glycemic control    06/10/17 1237   Signed and Held  HIV antibody (Routine Testing)  Once,   R     Signed and Held   Signed and Held  Basic metabolic panel  Daily,   R     Signed and Held      Vitals/Pain Today's Vitals   06/10/17 1100 06/10/17 1200 06/10/17 1230 06/10/17 1300  BP: 130/62 137/88 (!) 144/90 137/88  Pulse: 84 89 86 89  Resp: (!) 22 (!) 28 (!) 25 (!) 27  Temp:      TempSrc:      SpO2: 99% 100% 98% 100%  Weight:      Height:      PainSc:        Isolation Precautions No active isolations  Medications Medications  insulin aspart (novoLOG) injection 0-9 Units (not administered)  furosemide (LASIX) injection 40 mg (not administered)  enoxaparin (LOVENOX) injection 200 mg (not administered)  furosemide (LASIX) injection 40 mg (40 mg Intravenous Given 06/10/17 1116)    Mobility walks

## 2017-06-10 NOTE — H&P (Addendum)
History and Physical    Alexa [name] IDP:[id number] DOB: 07-17-1975 DOA: 06/10/2017  PCP: Joretta Bachelor, PA  Patient coming from: [facility]  I have personally briefly reviewed patient's old medical records in [place]  Chief Complaint: SOB since 4 weeks.   HPI: Alexa [name] is a 42 y.o. 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  She also reported worsening left lower extremity swelling and pain for the last few days.  She saw Dr. Burr Medico recently for PE and anticoagulation.  She denies sore chills, nausea vomiting diarrhea, abdominal pain, syncope, palpitations, chest pain, any sensory or motor deficits, headache, dysuria.  She was referred to medical service for evaluation of dyspnea on exertion.  On arrival to ED she is afebrile, tachypneic, hypoxic requiring about 2 L of nasal cannula oxygen to keep sats greater than 90%.  Lab work revealed elevated BNP of 177, INR of 1.46 chest x-ray showing interstitial edema with cardiomegaly.  Twelve-lead EKG shows sinus rhythm with nonspecific ST and T wave changes.   Review of Systems: As per HPI otherwise, all others negative.   Past Medical History:  Diagnosis Date  . Gestational diabetes   . Hypertension     Past Surgical History:  Procedure Laterality Date  . CESAREAN SECTION       reports that she has quit smoking. Her smoking use included cigarettes. she has never used smokeless tobacco. She reports that she drinks about 2.4 - 3.6 oz of alcohol per week. She reports that she does not use drugs.  Allergies  Allergen Reactions  . Penicillins Hives    Has patient had a PCN reaction causing immediate rash, facial/tongue/throat swelling, SOB or lightheadedness with hypotension: No Has patient had a PCN reaction causing severe  rash involving mucus membranes or skin necrosis: No Has patient had a PCN reaction that required hospitalization No Has patient had a PCN reaction occurring within the last 10 years: No If all of the above answers are "NO", then may proceed with Cephalosporin use.    Family History  Problem Relation Age of Onset  . Diabetes Maternal Grandmother   . Cancer Maternal Grandmother        breast cancer   . Hypertension Mother   . Cancer Maternal Aunt 55       breast cancer   . Cancer Maternal Aunt 63       breast cancer    Family history reviewed  Prior to Admission medications   Medication Sig Start Date End Date Taking? Authorizing Provider  cetirizine (ZYRTEC) 10 MG tablet Take 1 tablet (10 mg total) by mouth daily. 12/11/16  Yes Ivar Drape D, PA  ferrous sulfate 325 (65 FE) MG tablet Take 325 mg by mouth daily with breakfast.    Yes [provider]  furosemide (LASIX) 40 MG tablet Take 1 tablet (40 mg total) by mouth daily. 04/08/17  Yes English, Colletta Maryland D, PA  gabapentin (NEURONTIN) 300 MG capsule Take 1 capsule (300 mg total) by mouth 2 (two) times daily. 04/08/17  Yes Ivar Drape D, PA  losartan (COZAAR) 100 MG tablet take 1 tablet by mouth once daily 01/30/17  Yes English, [name] D, PA  metFORMIN (GLUCOPHAGE) 500 MG tablet take 1 tablet by mouth twice a day with meals 04/30/17  Yes Sagardia, Ines Bloomer, MD  warfarin (COUMADIN) 7.5 MG tablet take 1 and 1/2 tablet to 2 tablets once daily as directed BY COUMADIN CLINIC 06/07/17  Yes Kilroy, Luke K, PA-C  glucose blood test strip ascensia  Meter/strips preferred Test daily Use as instructed Dx.dm 06/23/16   Joretta Bachelor, PA  Lancets [facility] ULTRASOFT) lancets Test daily Use as instructed dm2 06/22/16   Ivar Drape D, PA  nystatin (MYCOSTATIN/NYSTOP) powder Apply topically 4 (four) times daily. Patient not taking: Reported on 06/10/2017 03/24/16   Shawnee Knapp, MD  predniSONE (DELTASONE) 20 MG tablet  Take 3 PO QAM x2days, 2 PO QAM x2days, 1 PO QAM x3days Patient not taking: Reported on 06/10/2017 12/11/16   Ivar Drape D, [place]    Physical Exam: Vitals:   06/10/17 0930 06/10/17 1052 06/10/17 1100 06/10/17 1200  BP: 137/82 126/61 130/62 137/88  Pulse: 88 96 84 89  Resp: (!) 21 (!) 26 (!) 22 (!) 28  Temp:      TempSrc:      SpO2: 95% 94% 99% 100%  Weight:      Height:        Constitutional: Mild discomfort with tachypnea on 2 L nasal cannula oxygen Vitals:   06/10/17 0930 06/10/17 1052 06/10/17 1100 06/10/17 1200  BP: 137/82 126/61 130/62 137/88  Pulse: 88 96 84 89  Resp: (!) 21 (!) 26 (!) 22 (!) 28  Temp:      TempSrc:      SpO2: 95% 94% 99% 100%  Weight:      Height:       Eyes: PERRL, lids and conjunctivae normal ENMT: Mucous membranes are moist. Posterior pharynx clear of any exudate or lesions.Normal dentition.  Neck: normal, supple, no masses, no thyromegaly Respiratory: Scattered Rales at bases, no wheezing, air entry fair Cardiovascular: Regular rate and rhythm, no murmursNo carotid bruits.  Abdomen: no tenderness, no masses palpated. No hepatosplenomegaly. Bowel sounds positive.  Musculoskeletal: Bilateral lower extremity lymphedema.  Left lower extremity tender when compared to right lower extremity. Skin: no rashes, lesions, ulcers. No induration Neurologic: CN 2-12 grossly intact. Sensation intact, DTR normal. Strength 5/5 in all 4.  Psychiatric: Normal judgment and insight. Alert and oriented x 3. Normal mood.     Labs on Admission: I have personally reviewed following labs and imaging studies  CBC: Recent Labs  Lab 06/10/17 0945  WBC 7.8  NEUTROABS 5.5  HGB 13.4  HCT 46.0  MCV 90.2  PLT 326   Basic Metabolic Panel: Recent Labs  Lab 06/10/17 0945  NA 140  K 4.6  CL 100*  CO2 33*  GLUCOSE 105*  BUN 12  CREATININE 0.89  CALCIUM 8.5*   GFR: Estimated Creatinine Clearance: 153.1 mL/min (by C-G formula based on SCr of 0.89  mg/dL). Liver Function Tests: No results for input(s): AST, ALT, ALKPHOS, BILITOT, PROT, ALBUMIN in the last 168 hours. No results for input(s): LIPASE, AMYLASE in the last 168 hours. No results for input(s): AMMONIA in the last 168 hours. Coagulation Profile: Recent Labs  Lab 06/10/17 0945  INR 1.46   Cardiac Enzymes: No results for input(s): CKTOTAL, CKMB, CKMBINDEX, TROPONINI in the last 168 hours. BNP (last 3 results) No results for input(s): PROBNP in the last 8760 hours. HbA1C: No results for input(s): HGBA1C in the last 72 hours. CBG: No results for input(s): GLUCAP in the last 168 hours. Lipid Profile: No results for input(s): CHOL, HDL, LDLCALC, TRIG, CHOLHDL, LDLDIRECT in the last 72 hours. Thyroid Function Tests: No results  for input(s): TSH, T4TOTAL, FREET4, T3FREE, THYROIDAB in the last 72 hours. Anemia Panel: No results for input(s): VITAMINB12, FOLATE, FERRITIN, TIBC, IRON, RETICCTPCT in the last 72 hours. Urine analysis:    Component Value Date/Time   COLORURINE YELLOW 05/18/2016 1532   APPEARANCEUR CLEAR 05/18/2016 1532   LABSPEC 1.005 05/18/2016 1532   PHURINE 7.0 05/18/2016 1532   GLUCOSEU NEGATIVE 05/18/2016 1532   HGBUR LARGE (A) 05/18/2016 1532   BILIRUBINUR negative 04/04/2017 1642   KETONESUR negative 04/04/2017 1642   KETONESUR NEGATIVE 05/18/2016 1532   PROTEINUR trace (A) 04/04/2017 1642   PROTEINUR NEGATIVE 05/18/2016 1532   UROBILINOGEN 0.2 04/04/2017 1642   UROBILINOGEN 1.0 04/25/2012 1908   NITRITE Negative 04/04/2017 1642   NITRITE NEGATIVE 05/18/2016 1532   LEUKOCYTESUR Negative 04/04/2017 1642    Radiological Exams on Admission: Dg Chest 2 View  Result Date: 06/10/2017 CLINICAL DATA:  Cough EXAM: CHEST - 2 VIEW COMPARISON:  05/18/2016 FINDINGS: Cardiac enlargement with vascular congestion. Negative for edema or effusion. Negative for pneumonia IMPRESSION: Cardiomegaly with vascular congestion.  Negative for edema Electronically Signed    By: Franchot Gallo M.D.   On: 06/10/2017 10:13    EKG: Independently reviewed.  Sinus rhythm with nonspecific ST and T wave changes  Assessment/Plan Active Problems:   Acute CHF (congestive heart failure) (HCC)    Dyspnea on exertion, worsening bilateral pedal edema, elevated BNP, chest x-ray showing cardiomegaly and interstitial edema Differential include acute CHF versus pulmonary hypertension from pulmonary embolism. Admit to telemetry, was start the patient on IV Lasix 40 mg twice daily. Get echocardiogram, serial troponins and repeat an EKG in the morning. Resume losartan. Watch electrolytes and creatinine on IV Lasix.  Acute respiratory failure with hypoxia see above   Morbid obesity outpatient follow-up with PCP for lifestyle modification and weight loss.  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  Patient is on Coumadin but her INR is subtherapeutic we will we will add Lovenox as bridge continue dosing Coumadin as per pharmacy.    Hypertension well-controlled  Diabetes Get hemoglobin A1c continue with SSRI.  Holding home medications at this time.    DVT prophylaxis: Lovenox and Coumadin Code Status: Full code Family Communication: Family at bedside Disposition Plan: Pending resolution of dyspnea on exertion. Consults called: None Admission status: Inpatient/telemetry   Hosie Poisson MD [facility] Pager [phone]  If 7PM-7AM, please contact night-coverage [email or web address] Password [facility]  06/10/2017, 12:39 PM

## 2017-06-10 NOTE — ED Notes (Signed)
Pure wick in place 

## 2017-06-10 NOTE — ED Triage Notes (Signed)
Since this AM. Pt states swelling in legs. Cough x 3 weeks

## 2017-06-10 NOTE — ED Notes (Signed)
Bed: WA17 Expected date:  Expected time:  Means of arrival:  Comments: Hold for RES 

## 2017-06-10 NOTE — Progress Notes (Signed)
ANTICOAGULATION CONSULT NOTE - Initial Consult  Pharmacy Consult for enoxaparin/warfarin Indication: DVT/PE, aortic thrombus  Allergies  Allergen Reactions  . Penicillins Hives    Has patient had a PCN reaction causing immediate rash, facial/tongue/throat swelling, SOB or lightheadedness with hypotension: No Has patient had a PCN reaction causing severe rash involving mucus membranes or skin necrosis: No Has patient had a PCN reaction that required hospitalization No Has patient had a PCN reaction occurring within the last 10 years: No If all of the above answers are "NO", then may proceed with Cephalosporin use.    Patient Measurements: Height: 5\' 4"  (162.6 cm) Weight: (!) 449 lb (203.7 kg) IBW/kg (Calculated) : 54.7 Heparin Dosing Weight:   Vital Signs: Temp: 98.9 F (37.2 C) (03/18 0906) Temp Source: Oral (03/18 0906) BP: 137/88 (03/18 1300) Pulse Rate: 89 (03/18 1300)  Labs: Recent Labs    06/10/17 0945  HGB 13.4  HCT 46.0  PLT 266  APTT 34  LABPROT 17.6*  INR 1.46  CREATININE 0.89    Estimated Creatinine Clearance: 153.1 mL/min (by C-G formula based on SCr of 0.89 mg/dL).   Medical History: Past Medical History:  Diagnosis Date  . Gestational diabetes   . Hypertension     Assessment: Alexa [name] presents with shortness of breath.  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  INR is subtherapeutic at time of admission (INR = 1.46).  Pharmacy asked to resume warfarin with enoxaparin bridge.    Home warfarin regimen: 15 mg daily with 11.25mg  on MWF. Last dose reported at 3/17 at 1900  Today, 06/10/2017  CBC: Hgb and pltc WNL  Renal: SCr WNL  Weight - morbid obesity noted.  203.7kg documented as weight   Goal of Therapy:  Anti-Xa level 0.6-1 units/ml 4hrs after LMWH dose given Monitor platelets by anticoagulation protocol: Yes   Plan:   Enoxaparin 200mg  SQ q12h (1st dose given in ED)  Due to morbid obesity, if remains on  enoxaparin > 2-3 days then will need to consider checking LMWH anti-Xa level  Give warfarin 17.5mg  PO x 1 tonight for SUBtherapeutic INR  Daily INR  CBC at least q72h while on LMWH  Monitor for bleeding  Doreene Eland, PharmD, BCPS.   Pager: [phone] 06/10/2017 2:09 PM

## 2017-06-10 NOTE — ED Provider Notes (Signed)
[facility] DEPT Provider Note   CSN: [id number] Arrival date & time: 06/10/17  0846     History   Chief Complaint Chief Complaint  Patient presents with  . Shortness of Breath    HPI Alexa [name] is a 42 y.o. female.  HPI  42 year old morbidly obese female comes in with chief complaint of shortness of breath.  She has history of CHF, DVT/PE, aortic thrombus, on Coumadin.  Patient states that she has been having a cough for the last 1 month.  Cough is producing either clear phlegm or yellow phlegm.  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  However, over the last 1 week she has noted increased shortness of breath with exertion.  Patient is also appreciating some leg swelling.  Patient is unsure of any orthopnea-like symptoms.  Patient has been taking her medications as prescribed.  When she had her PE, she was having shortness of breath, however her current symptoms are completely different.  Patient does not have any pleuritic chest pain.  Past Medical History:  Diagnosis Date  . Gestational diabetes   . Hypertension     Patient Active Problem List   Diagnosis Date Noted  . Acute CHF (congestive heart failure) ([place]) 06/10/2017  . Personal history of venous thrombosis and embolism 10/06/2016  . Pelvic lymphadenopathy 10/06/2016  . Adenopathy 08/08/2016  . Aortic thrombus ([place]) 06/19/2016  . Chronic anticoagulation 06/19/2016  . Diastolic dysfunction 95/28/4132  . HCD (hypertensive cardiovascular disease) 06/19/2016  . Anticoagulated on Coumadin 06/04/2016  . Other acute pulmonary embolism without acute cor pulmonale ([place]) 06/04/2016  . Acute pulmonary embolus ([place]) 05/14/2016  . Acute respiratory failure with hypoxia ([place]) 05/14/2016  . Hypoxia   . Class 3 obesity due to excess calories with serious comorbidity and body mass index (BMI) greater  than or equal to 70 in adult 04/15/2016  . Athlete's foot 10/08/2015  . 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 Benign essential HTN 06/22/2015  . 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 BMI 70 and over, adult ([place]) 06/22/2015  . Vitamin D deficiency 06/22/2015  . Iron deficiency anemia 06/22/2015    Past Surgical History:  Procedure Laterality Date  . CESAREAN SECTION      OB History    No data available       Home Medications    Prior to Admission medications   Medication Sig Start Date End Date Taking? Authorizing Provider  cetirizine (ZYRTEC) 10 MG tablet Take 1 tablet (10 mg total) by mouth daily. 12/11/16  Yes Ivar Drape D, PA  ferrous sulfate 325 (65 FE) MG tablet Take 325 mg by mouth daily with breakfast.    Yes [provider]  furosemide (LASIX) 40 MG tablet Take 1 tablet (40 mg total) by mouth daily. 04/08/17  Yes English, Colletta [place] D, PA  gabapentin (NEURONTIN) 300 MG capsule Take 1 capsule (300 mg total) by mouth 2 (two) times daily. 04/08/17  Yes Ivar Drape D, PA  losartan (COZAAR) 100 MG tablet take 1 tablet by mouth once daily 01/30/17  Yes English, [name] D, PA  metFORMIN (GLUCOPHAGE) 500 MG tablet take 1 tablet by mouth twice a day with meals 04/30/17  Yes Sagardia, Ines Bloomer, MD  warfarin (COUMADIN) 7.5 MG tablet take 1 and 1/2 tablet to 2 tablets once daily as directed BY COUMADIN CLINIC 06/07/17  Yes Erlene Quan, PA-C  glucose blood test strip ascensia  Meter/strips preferred Test daily Use as instructed Dx.dm 06/23/16   Ivar Drape D, PA  Lancets [facility] ULTRASOFT) lancets Test daily Use as instructed dm2 06/22/16   Ivar Drape D, PA  nystatin (MYCOSTATIN/NYSTOP) powder Apply topically 4 (four) times daily. Patient not taking: Reported on 06/10/2017 03/24/16   Shawnee Knapp, MD  predniSONE (DELTASONE) 20 MG tablet Take 3 PO QAM x2days, 2 PO QAM x2days, 1 PO QAM  x3days Patient not taking: Reported on 06/10/2017 12/11/16   Joretta Bachelor, PA    Family History Family History  Problem Relation Age of Onset  . Diabetes Maternal Grandmother   . Cancer Maternal Grandmother        breast cancer   . Hypertension Mother   . Cancer Maternal Aunt 55       breast cancer   . Cancer Maternal Aunt 33       breast cancer     Social History Social History   Tobacco Use  . Smoking status: Former Smoker    Types: Cigarettes  . Smokeless tobacco: Never Used  . Tobacco comment: only smokes when she drinks alcohol - 1 pack per week  Substance Use Topics  . Alcohol use: Yes    Alcohol/week: 2.4 - 3.6 oz    Types: 4 - 6 Standard drinks or equivalent per week    Comment: mixed drinks 3-4 times/weeks  . Drug use: No     Allergies   Penicillins   Review of Systems Review of Systems  Constitutional: Positive for activity change.  Respiratory: Positive for cough and shortness of breath.   Cardiovascular: Negative for chest pain.  Allergic/Immunologic: Negative for immunocompromised state.  Neurological: Negative for dizziness and syncope.  Hematological: Bruises/bleeds easily.  All other systems reviewed and are negative.    Physical Exam Updated Vital Signs BP 137/88   Pulse 89   Temp 98.9 F (37.2 C) (Oral)   Resp (!) 28   Ht 5\' 4"  (1.626 m)   Wt (!) 203.7 kg (449 lb)   SpO2 100%   BMI 77.07 kg/m   Physical Exam  Constitutional: She is oriented to person, place, and time. She appears well-developed.  Morbidly obese  HENT:  Head: Normocephalic and atraumatic.  Eyes: EOM are normal.  Neck: Normal range of motion. Neck supple.  Cardiovascular: Normal rate.  Pulmonary/Chest: Effort normal and breath sounds normal. No respiratory distress. She has no wheezes. She has no rhonchi. She has no rales.  Lungs are clear.  Abdominal: Bowel sounds are normal.  Musculoskeletal:       Right lower leg: She exhibits tenderness and edema.        Left lower leg: She exhibits tenderness and edema.  Exam is limited due to morbid obesity  Neurological: She is alert and oriented to person, place, and time.  Skin: Skin is warm and dry.  Nursing note and vitals reviewed.    ED Treatments / Results  Labs (all labs ordered are listed, but only abnormal results are displayed) Labs Reviewed  BASIC METABOLIC PANEL - Abnormal; Notable for the following components:      Result Value   Chloride 100 (*)    CO2 33 (*)    Glucose, Bld 105 (*)    Calcium 8.5 (*)    All other components within normal limits  CBC WITH DIFFERENTIAL/PLATELET - Abnormal; Notable for the following components:   MCHC 29.1 (*)  RDW 19.3 (*)    All other components within normal limits  PROTIME-INR - Abnormal; Notable for the following components:   Prothrombin Time 17.6 (*)    All other components within normal limits  BRAIN NATRIURETIC PEPTIDE - Abnormal; Notable for the following components:   B Natriuretic Peptide 177.0 (*)    All other components within normal limits  APTT    EKG  EKG Interpretation  Date/Time:  Monday June 10 2017 09:10:26 EDT Ventricular Rate:  91 PR Interval:    QRS Duration: 85 QT Interval:  372 QTC Calculation: 458 R Axis:   102 Text Interpretation:  Sinus rhythm Borderline right axis deviation No acute changes Nonspecific ST and T wave abnormality Confirmed by Varney Biles [phone]) on 06/10/2017 9:56:01 AM       Radiology Dg Chest 2 View  Result Date: 06/10/2017 CLINICAL DATA:  Cough EXAM: CHEST - 2 VIEW COMPARISON:  05/18/2016 FINDINGS: Cardiac enlargement with vascular congestion. Negative for edema or effusion. Negative for pneumonia IMPRESSION: Cardiomegaly with vascular congestion.  Negative for edema Electronically Signed   By: Franchot Gallo M.D.   On: 06/10/2017 10:13    Procedures Procedures (including critical care time)    Medications Ordered in ED Medications  furosemide (LASIX) injection 40 mg  (40 mg Intravenous Given 06/10/17 1116)     Initial Impression / Assessment and Plan / ED Course  I have reviewed the triage vital signs and the nursing notes.  Pertinent labs & imaging results that were available during my care of the patient were reviewed by me and considered in my medical decision making (see chart for details).  Clinical Course as of Jun 11 1222  Mon Jun 10, 2017  1223 Chest x-ray shows that patient has pulmonary congestion.  Upon ambulatory pulse ox, patient's O2 sats dropped to the 70s.  We will admit her for optimization.  Working diagnosis is that patient's symptoms are due to CHF exacerbation.  Hospitalist asked to consider CT PE if the patient's symptoms do not improve despite diuresing. DG Chest 2 View [AN]    Clinical Course User Index [AN] Varney Biles, MD   Morbidly obese female with history of PE on Coumadin and CHF comes in with chief complaint of shortness of breath.  Patient's dyspnea is exertional.  Given her obesity, exam is limited however she does not appear to be significantly volume overloaded.  Chest x-ray however does show bilateral pulmonary vascular congestion.  Patient's INR level has been therapeutic and she has been compliant with her medications.  Differential diagnosis is acute diastolic heart failure/pulmonary edema versus possible PE.  Suspicion for bronchitis or pneumonia is extremely low.  Upon ambulation patient's O2 sats dropped in the 70s.  We will admit her for optimization.  Final Clinical Impressions(s) / ED Diagnoses   Final diagnoses:  Acute respiratory failure with hypoxia ([place])  Acute systolic congestive heart failure ([place])  Morbid obesity [facility])    ED Discharge Orders    None       Varney Biles, MD 06/10/17 1226

## 2017-06-11 ENCOUNTER — Inpatient Hospital Stay (HOSPITAL_COMMUNITY): Payer: 59

## 2017-06-11 DIAGNOSIS — M79609 Pain in unspecified limb: Secondary | ICD-10-CM

## 2017-06-11 DIAGNOSIS — I361 Nonrheumatic tricuspid (valve) insufficiency: Secondary | ICD-10-CM

## 2017-06-11 DIAGNOSIS — I5021 Acute systolic (congestive) heart failure: Secondary | ICD-10-CM

## 2017-06-11 LAB — CBC
HCT: 48.7 % — ABNORMAL HIGH (ref 36.0–46.0)
Hemoglobin: 13.5 g/dL (ref 12.0–15.0)
MCH: 26 pg (ref 26.0–34.0)
MCHC: 27.7 g/dL — AB (ref 30.0–36.0)
MCV: 93.7 fL (ref 78.0–100.0)
Platelets: 336 10*3/uL (ref 150–400)
RBC: 5.2 MIL/uL — ABNORMAL HIGH (ref 3.87–5.11)
RDW: 19.8 % — ABNORMAL HIGH (ref 11.5–15.5)
WBC: 7.4 10*3/uL (ref 4.0–10.5)

## 2017-06-11 LAB — PROTIME-INR
INR: 1.66
PROTHROMBIN TIME: 19.4 s — AB (ref 11.4–15.2)

## 2017-06-11 LAB — GLUCOSE, CAPILLARY
GLUCOSE-CAPILLARY: 105 mg/dL — AB (ref 65–99)
Glucose-Capillary: 109 mg/dL — ABNORMAL HIGH (ref 65–99)
Glucose-Capillary: 87 mg/dL (ref 65–99)
Glucose-Capillary: 119 mg/dL — ABNORMAL HIGH (ref 65–99)

## 2017-06-11 LAB — BASIC METABOLIC PANEL
ANION GAP: 8 (ref 5–15)
BUN: 11 mg/dL (ref 6–20)
CHLORIDE: 98 mmol/L — AB (ref 101–111)
CO2: 34 mmol/L — ABNORMAL HIGH (ref 22–32)
Calcium: 8.8 mg/dL — ABNORMAL LOW (ref 8.9–10.3)
Creatinine, Ser: 0.85 mg/dL (ref 0.44–1.00)
GFR calc Af Amer: >60 mL/min (ref >60)
GLUCOSE: 109 mg/dL — AB (ref 65–99)
POTASSIUM: 4.3 mmol/L (ref 3.5–5.1)
Sodium: 140 mmol/L (ref 135–145)

## 2017-06-11 LAB — ECHOCARDIOGRAM COMPLETE
Height: 64 in
Weight: 6984.17 oz

## 2017-06-11 LAB — HIV ANTIBODY (ROUTINE TESTING W REFLEX): HIV Screen 4th Generation wRfx: NONREACTIVE

## 2017-06-11 LAB — D-DIMER, QUANTITATIVE (NOT AT ARMC): D-Dimer, Quant: 0.82 ug/mL-FEU — ABNORMAL HIGH (ref 0.00–0.50)

## 2017-06-11 LAB — D-DIMER, QUANTITATIVE: D-Dimer, Quant: 0.81 ug/mL-FEU — ABNORMAL HIGH (ref 0.00–0.50)

## 2017-06-11 MED ORDER — WARFARIN SODIUM 5 MG PO TABS
15.0000 mg | ORAL_TABLET | Freq: Once | ORAL | Status: AC
  Administered 2017-06-11: 15 mg via ORAL
  Filled 2017-06-11: qty 3

## 2017-06-11 NOTE — Progress Notes (Signed)
  Echocardiogram 2D Echocardiogram has been performed.  Alexa [name] F 06/11/2017, 3:17 PM

## 2017-06-11 NOTE — Progress Notes (Signed)
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  The patient was admitted with shortness of breath.  Patient also reported worsening left lower extremity edema with pain.  Cardiac BNP is mildly elevated at 177.  The patient is currently on anticoagulation.  No d-dimer visualized.  Doppler ultrasound was said to be inconclusive due to the patient's body habitus.  We will go ahead and check d-dimer, and if elevated, will proceed with CT of the chest.  As mentioned above, the patient is already anticoagulated.  Assessment & Plan:   Active Problems:   Acute CHF (congestive heart failure) (HCC)   Dyspnea on exertion, worsening bilateral pedal edema, elevated BNP, chest x-ray showing cardiomegaly and interstitial edema: Differential include acute CHF versus pulmonary hypertension from pulmonary embolism. Admit to telemetry, was start the patient on IV Lasix 40 mg twice daily. Get echocardiogram, serial troponins and repeat an EKG in the morning. Resume losartan. Watch electrolytes and creatinine on IV Lasix. 06/11/2017: Doppler ultrasound done was inconclusive.  Will check the d-dimer.  Echocardiogram is as above.  Acute respiratory failure with hypoxia:  -See above   Morbid obesity outpatient follow-up with PCP for lifestyle modification and weight loss.  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  Patient is on Coumadin but her INR is subtherapeutic we will we will add Lovenox as bridge continue dosing Coumadin as per  pharmacy.    Hypertension well-controlled  Diabetes Get hemoglobin A1c continue with SSRI.  Holding home medications at this time.    DVT prophylaxis: Lovenox and Coumadin Code Status: Full code Family Communication: Family at bedside Disposition Plan: Pending resolution of dyspnea on exertion. Consults called: None     DVT prophylaxis: The patient has also some subacute left ninth Code Status: Full Family Communication: None Disposition Plan: Depends on hospital course   Consultants:   None  Procedures:  ECHO: The estimated ejection fraction was in the range of 70% to 75%. There   was dynamic obstruction. There was dynamic obstruction at restin the mid cavity, with a peak velocity of 250 cm/sec and a peak gradient of 25 mm Hg. Wall motion was normal; there were no   regional wall motion abnormalities. Left ventricular diastolic function parameters were normal.  - Ventricular septum: The contour showed diastolic flattening and systolic flattening. These changes are consistent with RV volume and pressure overload.    -Right ventricle: The cavity size was moderately dilated. Wall   thickness was normal. - Right atrium: The atrium was moderately dilated. - Tricuspid valve: There was moderate-severe regurgitation directed   centrally. - Pulmonary arteries: Systolic pressure was severely increased. PA   peak pressure: 80 mm Hg (S).  Antimicrobials:   None   Subjective: Reported shortness of breath and pain in involving the left lower extremity.  Objective: Vitals:   06/11/17 0417 06/11/17 0420 06/11/17 1422 06/11/17 2018  BP: (!) 144/77  (!) 131/51 129/72  Pulse: 97  (!) 103 98  Resp: 20  18 19   Temp: 98.1 F (36.7 C)  99.7 F (37.6 C) 98.8 F (37.1 C)  TempSrc: Oral  Oral  Oral  SpO2: 94%  94% 93%  Weight:  (!) 198 kg (436 lb 8.2 oz)    Height:        Intake/Output Summary (Last 24 hours) at 06/11/2017 2033 Last data filed at 06/11/2017 2028 Gross per  24 hour  Intake 720 ml  Output 4750 ml  Net -4030 ml   Filed Weights   06/10/17 0904 06/10/17 1403 06/11/17 0420  Weight: (!) 203.7 kg (449 lb) (!) 200.5 kg (442 lb 0.4 oz) (!) 198 kg (436 lb 8.2 oz)    Examination:  General exam: Appears calm and comfortable.  Morbidly obese. Respiratory system: Clear to auscultation. Respiratory effort normal. Cardiovascular system: S1 & S2 heard, RRR. No JVD, murmurs, rubs, gallops or clicks. No pedal edema. Gastrointestinal system: Abdomen is nondistended, soft and nontender. No organomegaly or masses felt. Normal bowel sounds heard. Central nervous system: Alert and oriented. No focal neurological deficits. Extremities: Symmetric 5 x 5 power. Psychiatry: Judgement and insight appear normal. Mood & affect appropriate.     Data Reviewed: I have personally reviewed following labs and imaging studies  CBC: Recent Labs  Lab 06/10/17 0945 06/11/17 0510  WBC 7.8 7.4  NEUTROABS 5.5  --   HGB 13.4 13.5  HCT 46.0 48.7*  MCV 90.2 93.7  PLT 266 841   Basic Metabolic Panel: Recent Labs  Lab 06/10/17 0945 06/11/17 0510  NA 140 140  K 4.6 4.3  CL 100* 98*  CO2 33* 34*  GLUCOSE 105* 109*  BUN 12 11  CREATININE 0.89 0.85  CALCIUM 8.5* 8.8*   GFR: Estimated Creatinine Clearance: 157.1 mL/min (by C-G formula based on SCr of 0.85 mg/dL). Liver Function Tests: No results for input(s): AST, ALT, ALKPHOS, BILITOT, PROT, ALBUMIN in the last 168 hours. No results for input(s): LIPASE, AMYLASE in the last 168 hours. No results for input(s): AMMONIA in the last 168 hours. Coagulation Profile: Recent Labs  Lab 06/10/17 0945 06/11/17 0510  INR 1.46 1.66   Cardiac Enzymes: No results for input(s): CKTOTAL, CKMB, CKMBINDEX, TROPONINI in the last 168 hours. BNP (last 3 results) No results for input(s): PROBNP in the last 8760 hours. HbA1C: Recent Labs    06/10/17 1420  HGBA1C 5.7*   CBG: Recent Labs  Lab 06/10/17 1739 06/10/17 2005  06/11/17 0740 06/11/17 1137 06/11/17 1652  GLUCAP 109* 102* 105* 109* 87   Lipid Profile: No results for input(s): CHOL, HDL, LDLCALC, TRIG, CHOLHDL, LDLDIRECT in the last 72 hours. Thyroid Function Tests: No results for input(s): TSH, T4TOTAL, FREET4, T3FREE, THYROIDAB in the last 72 hours. Anemia Panel: No results for input(s): VITAMINB12, FOLATE, FERRITIN, TIBC, IRON, RETICCTPCT in the last 72 hours. Urine analysis:    Component Value Date/Time   COLORURINE YELLOW 05/18/2016 1532   APPEARANCEUR CLEAR 05/18/2016 1532   LABSPEC 1.005 05/18/2016 1532   PHURINE 7.0 05/18/2016 1532   GLUCOSEU NEGATIVE 05/18/2016 1532   HGBUR LARGE (A) 05/18/2016 1532   BILIRUBINUR negative 04/04/2017 1642   KETONESUR negative 04/04/2017 1642   KETONESUR NEGATIVE 05/18/2016 1532   PROTEINUR trace (A) 04/04/2017 1642   PROTEINUR NEGATIVE 05/18/2016 1532   UROBILINOGEN 0.2 04/04/2017 1642   UROBILINOGEN 1.0 04/25/2012 1908   NITRITE Negative 04/04/2017 1642   NITRITE NEGATIVE 05/18/2016 1532   LEUKOCYTESUR Negative 04/04/2017 1642   Sepsis Labs: @LABRCNTIP (procalcitonin:4,lacticidven:4)  )No results found for this or any previous visit (from the past 240 hour(s)).       Radiology Studies: Dg Chest 2 View  Result  Date: 06/10/2017 CLINICAL DATA:  Cough EXAM: CHEST - 2 VIEW COMPARISON:  05/18/2016 FINDINGS: Cardiac enlargement with vascular congestion. Negative for edema or effusion. Negative for pneumonia IMPRESSION: Cardiomegaly with vascular congestion.  Negative for edema Electronically Signed   By: Franchot Gallo M.D.   On: 06/10/2017 10:13        Scheduled Meds: . chlorhexidine  15 mL Mouth Rinse BID  . enoxaparin (LOVENOX) injection  200 mg Subcutaneous Q12H  . furosemide  40 mg Intravenous BID  . gabapentin  300 mg Oral BID  . insulin aspart  0-9 Units Subcutaneous TID WC  . losartan  100 mg Oral Daily  . mouth rinse  15 mL Mouth Rinse q12n4p  . sodium chloride flush  3 mL  Intravenous Q12H  . Warfarin - Pharmacist Dosing Inpatient   Does not apply q1800   Continuous Infusions: . sodium chloride       LOS: 1 day    Time spent: 35 minutes.    Dana Allan, MD  [facility] Pager #: [phone] 7PM-7AM contact night coverage as above

## 2017-06-11 NOTE — Progress Notes (Signed)
ANTICOAGULATION CONSULT NOTE - Follow Up Consult  Pharmacy Consult for lovenox and warfarin Indication: hx pulmonary embolus and DVT, aortic thrombus  Allergies  Allergen Reactions  . Penicillins Hives    Has patient had a PCN reaction causing immediate rash, facial/tongue/throat swelling, SOB or lightheadedness with hypotension: No Has patient had a PCN reaction causing severe rash involving mucus membranes or skin necrosis: No Has patient had a PCN reaction that required hospitalization No Has patient had a PCN reaction occurring within the last 10 years: No If all of the above answers are "NO", then may proceed with Cephalosporin use.    Patient Measurements: Height: 5\' 4"  (162.6 cm) Weight: (!) 436 lb 8.2 oz (198 kg) IBW/kg (Calculated) : 54.7 Heparin Dosing Weight:   Vital Signs: Temp: 98.1 F (36.7 C) (03/19 0417) Temp Source: Oral (03/19 0417) BP: 144/77 (03/19 0417) Pulse Rate: 97 (03/19 0417)  Labs: Recent Labs    06/10/17 0945 06/11/17 0510  HGB 13.4 13.5  HCT 46.0 48.7*  PLT 266 336  APTT 34  --   LABPROT 17.6* 19.4*  INR 1.46 1.66  CREATININE 0.89 0.85    Estimated Creatinine Clearance: 157.1 mL/min (by C-G formula based on SCr of 0.85 mg/dL).   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  INR was subtherapeutic on admission. Warfarin resumed on admission and Lovenox started for bridging since INR was subtherapeutic.  Today, 06/11/2017: - INR is sub-therapeuic but increased to 1.66 today - D- dimer elevated at 0.82 - LE doppler pending  - no significant drug-drug intxns   Goal of Therapy:  INR 2-3 Anti-Xa level 0.6-1 units/ml 4hrs after LMWH dose given Monitor platelets by anticoagulation protocol: Yes   Plan:  - warfarin 15 mg PO x1 today - continue lovenox 200 mg  SQ q12h - monitor for s/s bleeding  Blossie Raffel P 06/11/2017,12:54 PM

## 2017-06-11 NOTE — Progress Notes (Signed)
*  Preliminary Results* Left lower extremity venous duplex completed. Unable to adequately evaluate any of the left lower extremity veins due to technical limitations including patient body habitus, depth of vessels, and limitations of ultrasound penetration. Therefore, this exam is inconclusive.  06/11/2017 4:05 PM  Maudry Mayhew, BS, RVT, RDCS, RDMS

## 2017-06-12 LAB — CBC WITH DIFFERENTIAL/PLATELET
Basophils Absolute: 0 10*3/uL (ref 0.0–0.1)
Basophils Relative: 0 %
Eosinophils Absolute: 0 10*3/uL (ref 0.0–0.7)
Eosinophils Relative: 0 %
HCT: 48.1 % — ABNORMAL HIGH (ref 36.0–46.0)
Hemoglobin: 13.8 g/dL (ref 12.0–15.0)
Lymphocytes Relative: 16 %
Lymphs Abs: 1.3 10*3/uL (ref 0.7–4.0)
MCH: 26.6 pg (ref 26.0–34.0)
MCHC: 28.7 g/dL — ABNORMAL LOW (ref 30.0–36.0)
MCV: 92.9 fL (ref 78.0–100.0)
Monocytes Absolute: 1.3 10*3/uL — ABNORMAL HIGH (ref 0.1–1.0)
Monocytes Relative: 16 %
Neutro Abs: 5.5 10*3/uL (ref 1.7–7.7)
Neutrophils Relative %: 68 %
Platelets: 279 10*3/uL (ref 150–400)
RBC: 5.18 MIL/uL — ABNORMAL HIGH (ref 3.87–5.11)
RDW: 19.4 % — ABNORMAL HIGH (ref 11.5–15.5)
WBC: 8.1 10*3/uL (ref 4.0–10.5)

## 2017-06-12 LAB — GLUCOSE, CAPILLARY
GLUCOSE-CAPILLARY: 84 mg/dL (ref 65–99)
Glucose-Capillary: 89 mg/dL (ref 65–99)
Glucose-Capillary: 108 mg/dL — ABNORMAL HIGH (ref 65–99)
Glucose-Capillary: 117 mg/dL — ABNORMAL HIGH (ref 65–99)

## 2017-06-12 LAB — RENAL FUNCTION PANEL
Albumin: 3.1 g/dL — ABNORMAL LOW (ref 3.5–5.0)
Anion gap: 10 (ref 5–15)
BUN: 12 mg/dL (ref 6–20)
CO2: 32 mmol/L (ref 22–32)
Calcium: 8.2 mg/dL — ABNORMAL LOW (ref 8.9–10.3)
Chloride: 96 mmol/L — ABNORMAL LOW (ref 101–111)
Creatinine, Ser: 1 mg/dL (ref 0.44–1.00)
GFR calc Af Amer: >60 mL/min (ref >60)
GFR calc non Af Amer: >60 mL/min (ref >60)
Glucose, Bld: 112 mg/dL — ABNORMAL HIGH (ref 65–99)
Phosphorus: 5 mg/dL — ABNORMAL HIGH (ref 2.5–4.6)
Potassium: 4.7 mmol/L (ref 3.5–5.1)
Sodium: 138 mmol/L (ref 135–145)

## 2017-06-12 LAB — HEPARIN LEVEL (UNFRACTIONATED): Heparin Unfractionated: 1.02 IU/mL — ABNORMAL HIGH (ref 0.30–0.70)

## 2017-06-12 LAB — PROTIME-INR
INR: 1.74
Prothrombin Time: 20.2 seconds — ABNORMAL HIGH (ref 11.4–15.2)

## 2017-06-12 MED ORDER — WARFARIN SODIUM 5 MG PO TABS
15.0000 mg | ORAL_TABLET | Freq: Once | ORAL | Status: AC
  Administered 2017-06-12: 15 mg via ORAL
  Filled 2017-06-12: qty 3

## 2017-06-12 NOTE — Progress Notes (Signed)
ANTICOAGULATION CONSULT NOTE - Follow Up Consult  Pharmacy Consult for lovenox and warfarin Indication: hx pulmonary embolus and DVT, aortic thrombus  Allergies  Allergen Reactions  . Penicillins Hives    Has patient had a PCN reaction causing immediate rash, facial/tongue/throat swelling, SOB or lightheadedness with hypotension: No Has patient had a PCN reaction causing severe rash involving mucus membranes or skin necrosis: No Has patient had a PCN reaction that required hospitalization No Has patient had a PCN reaction occurring within the last 10 years: No If all of the above answers are "NO", then may proceed with Cephalosporin use.    Patient Measurements: Height: 5\' 4"  (162.6 cm) Weight: (!) 434 lb 15.5 oz (197.3 kg) IBW/kg (Calculated) : 54.7 Heparin Dosing Weight:   Vital Signs: Temp: 98 F (36.7 C) (03/20 0617) Temp Source: Oral (03/20 0617) BP: 131/75 (03/20 0617) Pulse Rate: 90 (03/20 0617)  Labs: Recent Labs    06/10/17 0945 06/11/17 0510 06/12/17 0550  HGB 13.4 13.5 13.8  HCT 46.0 48.7* 48.1*  PLT 266 336 279  APTT 34  --   --   LABPROT 17.6* 19.4* 20.2*  INR 1.46 1.66 1.74  CREATININE 0.89 0.85 1.00    Estimated Creatinine Clearance: 133.2 mL/min (by C-G formula based on SCr of 1 mg/dL).   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  INR was subtherapeutic on admission. Warfarin resumed on admission and Lovenox started for bridging since INR was subtherapeutic.  3/19: unable to perform LE doppler d/t baby habitus  Today, 06/12/2017: - INR is sub-therapeuic but trending up to 1.74 today - Anti-X level drawn 4 hrs after lovenox dose -- > therapeutic at 1.02 - no significant drug-drug intxns - eating 100% of meals  Goal of Therapy:  INR 2-3 Anti-Xa level 0.6-1 units/ml  4hrs after LMWH dose given Monitor platelets by anticoagulation protocol: Yes   Plan:  - repeat warfarin 15 mg PO x1 today - continue lovenox 200 mg SQ q12h - monitor for s/s bleeding  Yair Dusza P 06/12/2017,11:46 AM

## 2017-06-12 NOTE — Progress Notes (Signed)
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  The patient was admitted with shortness of breath.  Patient also reported worsening left lower extremity edema with pain.  Cardiac BNP is mildly elevated at 177.  The patient is currently on anticoagulation.  No d-dimer visualized.  Doppler ultrasound was said to be inconclusive due to the patient's body habitus.  We will go ahead and check d-dimer, and if elevated, will proceed with CT of the chest.  As mentioned above, the patient is already anticoagulated.  Assessment & Plan:   Active Problems:   Acute CHF (congestive heart failure) (HCC)   Dyspnea on exertion, worsening bilateral pedal edema, elevated BNP, chest x-ray showing cardiomegaly and interstitial edema: Differential include acute CHF versus pulmonary hypertension from pulmonary embolism. Admit to telemetry, was start the patient on IV Lasix 40 mg twice daily. Get echocardiogram, serial troponins and repeat an EKG in the morning. All resume losartan. Watch electrolytes and creatinine on IV Lasix. 06/11/2017: Doppler ultrasound done was inconclusive.  We will continue anticoagulation apparently, the patient says stopped taking her Coumadin for about a week prior to admission.    Echocardiogram -  "Left ventricle: The cavity size was normal. There was mild   concentric hypertrophy. Systolic function was hyperdynamic. The   estimated ejection fraction was in the range of 70% to 75%. There   was dynamic obstruction. There was dynamic obstruction at restin   the mid cavity, with a peak velocity of 250 cm/sec and a peak   gradient of 25 mm Hg. Wall motion was normal;  there were no   regional wall motion abnormalities. Left ventricular diastolic   function parameters were normal. - Ventricular septum: The contour showed diastolic flattening and   systolic flattening. These changes are consistent with RV volume   and pressure overload. - Right ventricle: The cavity size was moderately dilated. Wall   thickness was normal. - Right atrium: The atrium was moderately dilated. - Tricuspid valve: There was moderate-severe regurgitation directed   centrally. - Pulmonary arteries: Systolic pressure was severely increased. PA   peak pressure: 80 mm Hg (S).  Impressions:  - Findings suggest chronic cor pulmonale, but superimposed acute   event (e.g. pulmonary embolism, acute hypoxic respiratory   failure, etc.) is likely present. Right heart overload findings   have worsened ince the precious study".   Acute on chronic respiratory failure with hypoxia:  -See above -This likely multifactorial. -Patient is morbidly obese. -Patient likely has OSA/OHS. -Patient has had problems with thromboembolic disease. -Patient is not compliant with anticoagulation.  Therefore, patient is at risk for recurrent thrombosis and embolism. -Long-term prognosis is guarded.   Morbid obesity outpatient follow-up with PCP for lifestyle modification and weight loss.  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  Patient is on Coumadin but her INR is subtherapeutic we will we will add Lovenox as bridge continue dosing Coumadin as per pharmacy.    Hypertension well-controlled  Diabetes Get hemoglobin A1c continue with SSRI.  Holding home medications at this time.    DVT prophylaxis: Lovenox and Coumadin Code Status: Full code  Family Communication:  Mother. Disposition Plan:  This will depend on hospital course.    Consults called: None   Consultants:   None  Procedures:  ECHO: See above  Antimicrobials:   None   Subjective: No new complaints.  No chest pain, no shortness of breath.  Left leg pain has resolved.  Will likely discharge patient when the patient INR is optimized.    Objective: Vitals:   06/11/17 0420 06/11/17 1422 06/11/17 2018 06/12/17 0617  BP:  (!) 131/51 129/72 131/75  Pulse:  (!) 103 98 90  Resp:  18 19 18   Temp:  99.7 F (37.6 C) 98.8 F (37.1 C) 98 F (36.7 C)  TempSrc:  Oral Oral Oral  SpO2:  94% 93% 96%  Weight: (!) 198 kg (436 lb 8.2 oz)   (!) 197.3 kg (434 lb 15.5 oz)  Height:        Intake/Output Summary (Last 24 hours) at 06/12/2017 1308 Last data filed at 06/12/2017 0900 Gross per 24 hour  Intake 840 ml  Output 2050 ml  Net -1210 ml   Filed Weights   06/10/17 1403 06/11/17 0420 06/12/17 0617  Weight: (!) 200.5 kg (442 lb 0.4 oz) (!) 198 kg (436 lb 8.2 oz) (!) 197.3 kg (434 lb 15.5 oz)    Examination:  General exam: Appears calm and comfortable.  Morbidly obese. Respiratory system: Decreased air entry globally.   Cardiovascular system: S1 & S2 Gastrointestinal system: Abdomen is morbidly obese, soft and nontender.  Organs are difficult to assess.   Central nervous system: Alert and oriented. No focal neurological deficits. Extremities: Symmetric 5 x 5 power.  Lower extremity edema. Psychiatry: Judgement and insight appear normal. Mood & affect appropriate.     Data Reviewed: I have personally reviewed following labs and imaging studies  CBC: Recent Labs  Lab 06/10/17 0945 06/11/17 0510 06/12/17 0550  WBC 7.8 7.4 8.1  NEUTROABS 5.5  --  5.5  HGB 13.4 13.5 13.8  HCT 46.0 48.7* 48.1*  MCV 90.2 93.7 92.9  PLT 266 336 371   Basic Metabolic Panel: Recent Labs  Lab 06/10/17 0945 06/11/17 0510 06/12/17 0550  NA 140 140 138  K 4.6 4.3 4.7  CL 100* 98* 96*  CO2 33* 34* 32  GLUCOSE 105* 109* 112*  BUN 12 11 12   CREATININE 0.89  0.85 1.00  CALCIUM 8.5* 8.8* 8.2*  PHOS  --   --  5.0*   GFR: Estimated Creatinine Clearance: 133.2 mL/min (by C-G formula based on SCr of 1 mg/dL). Liver Function Tests: Recent Labs  Lab 06/12/17 0550  ALBUMIN 3.1*   No results for input(s): LIPASE, AMYLASE in the last 168 hours. No results for input(s): AMMONIA in the last 168 hours. Coagulation Profile: Recent Labs  Lab 06/10/17 0945 06/11/17 0510 06/12/17 0550  INR 1.46 1.66 1.74   Cardiac Enzymes: No results for input(s): CKTOTAL, CKMB, CKMBINDEX, TROPONINI in the last 168 hours. BNP (last 3 results) No results for input(s): PROBNP in the last 8760 hours. HbA1C: Recent Labs    06/10/17 1420  HGBA1C 5.7*   CBG: Recent Labs  Lab 06/11/17 1137 06/11/17 1652 06/11/17 2020 06/12/17 0749 06/12/17 1150  GLUCAP 109* 87 119* 89 84   Lipid Profile: No results for input(s): CHOL, HDL, LDLCALC, TRIG, CHOLHDL, LDLDIRECT in the last 72 hours. Thyroid Function Tests: No results for input(s): TSH, T4TOTAL, FREET4, T3FREE, THYROIDAB in the last 72 hours. Anemia Panel: No results for input(s): VITAMINB12, FOLATE, FERRITIN, TIBC,  IRON, RETICCTPCT in the last 72 hours. Urine analysis:    Component Value Date/Time   COLORURINE YELLOW 05/18/2016 1532   APPEARANCEUR CLEAR 05/18/2016 1532   LABSPEC 1.005 05/18/2016 1532   PHURINE 7.0 05/18/2016 1532   GLUCOSEU NEGATIVE 05/18/2016 1532   HGBUR LARGE (A) 05/18/2016 1532   BILIRUBINUR negative 04/04/2017 1642   KETONESUR negative 04/04/2017 1642   KETONESUR NEGATIVE 05/18/2016 1532   PROTEINUR trace (A) 04/04/2017 1642   PROTEINUR NEGATIVE 05/18/2016 1532   UROBILINOGEN 0.2 04/04/2017 1642   UROBILINOGEN 1.0 04/25/2012 1908   NITRITE Negative 04/04/2017 1642   NITRITE NEGATIVE 05/18/2016 1532   LEUKOCYTESUR Negative 04/04/2017 1642   Sepsis Labs: @LABRCNTIP (procalcitonin:4,lacticidven:4)  )No results found for this or any previous visit (from the past 240 hour(s)).        Radiology Studies: No results found.      Scheduled Meds: . chlorhexidine  15 mL Mouth Rinse BID  . enoxaparin (LOVENOX) injection  200 mg Subcutaneous Q12H  . furosemide  40 mg Intravenous BID  . gabapentin  300 mg Oral BID  . insulin aspart  0-9 Units Subcutaneous TID WC  . losartan  100 mg Oral Daily  . mouth rinse  15 mL Mouth Rinse q12n4p  . sodium chloride flush  3 mL Intravenous Q12H  . Warfarin - Pharmacist Dosing Inpatient   Does not apply q1800   Continuous Infusions: . sodium chloride       LOS: 2 days    Time spent: 35 minutes.    Dana Allan, MD  [facility] Pager #: [phone] 7PM-7AM contact night coverage as above

## 2017-06-13 LAB — RENAL FUNCTION PANEL
Albumin: 3.1 g/dL — ABNORMAL LOW (ref 3.5–5.0)
Anion gap: 8 (ref 5–15)
BUN: 13 mg/dL (ref 6–20)
CO2: 33 mmol/L — ABNORMAL HIGH (ref 22–32)
Calcium: 8.4 mg/dL — ABNORMAL LOW (ref 8.9–10.3)
Chloride: 95 mmol/L — ABNORMAL LOW (ref 101–111)
Creatinine, Ser: 0.98 mg/dL (ref 0.44–1.00)
GFR calc Af Amer: >60 mL/min (ref >60)
GFR calc non Af Amer: >60 mL/min (ref >60)
Glucose, Bld: 111 mg/dL — ABNORMAL HIGH (ref 65–99)
Phosphorus: 3.4 mg/dL (ref 2.5–4.6)
Potassium: 4.3 mmol/L (ref 3.5–5.1)
Sodium: 136 mmol/L (ref 135–145)

## 2017-06-13 LAB — PROTIME-INR
INR: 1.73
Prothrombin Time: 20.1 seconds — ABNORMAL HIGH (ref 11.4–15.2)

## 2017-06-13 LAB — GLUCOSE, CAPILLARY
GLUCOSE-CAPILLARY: 108 mg/dL — AB (ref 65–99)
Glucose-Capillary: 104 mg/dL — ABNORMAL HIGH (ref 65–99)
Glucose-Capillary: 99 mg/dL (ref 65–99)
Glucose-Capillary: 104 mg/dL — ABNORMAL HIGH (ref 65–99)

## 2017-06-13 MED ORDER — DM-GUAIFENESIN ER 30-600 MG PO TB12
1.0000 | ORAL_TABLET | Freq: Two times a day (BID) | ORAL | Status: DC
  Administered 2017-06-13 – 2017-06-15 (×5): 1 via ORAL
  Filled 2017-06-13 (×5): qty 1

## 2017-06-13 MED ORDER — WARFARIN SODIUM 5 MG PO TABS
20.0000 mg | ORAL_TABLET | Freq: Once | ORAL | Status: AC
Start: 2017-06-13 — End: 2017-06-13
  Administered 2017-06-13: 20 mg via ORAL
  Filled 2017-06-13: qty 4

## 2017-06-13 NOTE — Progress Notes (Signed)
ANTICOAGULATION CONSULT NOTE - Follow Up Consult  Pharmacy Consult for lovenox and warfarin Indication: hx pulmonary embolus and DVT, aortic thrombus  Allergies  Allergen Reactions  . Penicillins Hives    Has patient had a PCN reaction causing immediate rash, facial/tongue/throat swelling, SOB or lightheadedness with hypotension: No Has patient had a PCN reaction causing severe rash involving mucus membranes or skin necrosis: No Has patient had a PCN reaction that required hospitalization No Has patient had a PCN reaction occurring within the last 10 years: No If all of the above answers are "NO", then may proceed with Cephalosporin use.    Patient Measurements: Height: 5\' 4"  (162.6 cm) Weight: (!) 429 lb 14.4 oz (195 kg) IBW/kg (Calculated) : 54.7 Heparin Dosing Weight:   Vital Signs: Temp: 98.7 F (37.1 C) (03/21 0517) Temp Source: Oral (03/21 0517) BP: 124/59 (03/21 0517) Pulse Rate: 95 (03/21 0517)  Labs: Recent Labs    06/11/17 0510 06/12/17 0550 06/12/17 1424 06/13/17 0550  HGB 13.5 13.8  --   --   HCT 48.7* 48.1*  --   --   PLT 336 279  --   --   LABPROT 19.4* 20.2*  --  20.1*  INR 1.66 1.74  --  1.73  HEPARINUNFRC  --   --  1.02*  --   CREATININE 0.85 1.00  --  0.98    Estimated Creatinine Clearance: 134.8 mL/min (by C-G formula based on SCr of 0.98 mg/dL).   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  INR was subtherapeutic on admission. Warfarin resumed on admission and Lovenox started for bridging since INR was subtherapeutic.  3/19: unable to perform LE doppler d/t baby habitus  Today, 06/13/2017: - INR is sub-therapeutic at 1.73 - Anti-X level drawn 4 hrs after lovenox dose -- > therapeutic at 1.02 - no significant drug-drug intxns - eating 100% of meals  Goal of  Therapy:  INR 2-3 Anti-Xa level 0.6-1 units/ml 4hrs after LMWH dose given Monitor platelets by anticoagulation protocol: Yes   Plan:  - increase warfarin dose to 20 mg PO x1 today (~1.5x home dose) - continue lovenox 200 mg SQ q12h - monitor for s/s bleeding  Rakiya Krawczyk P 06/13/2017,10:27 AM

## 2017-06-13 NOTE — Progress Notes (Signed)
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  The patient was admitted with shortness of breath.  Patient also reported worsening left lower extremity edema with pain.  Cardiac BNP is mildly elevated at 177.  The patient is currently on anticoagulation.  No d-dimer visualized.  Doppler ultrasound was said to be inconclusive due to the patient's body habitus.  We will go ahead and check d-dimer, and if elevated, will proceed with CT of the chest.  As mentioned above, the patient is already anticoagulated.  Assessment & Plan:   Active Problems:   Acute CHF (congestive heart failure) (HCC)   Dyspnea on exertion, worsening bilateral pedal edema, elevated BNP, chest x-ray showing cardiomegaly and interstitial edema: Differential include acute CHF versus pulmonary hypertension from pulmonary embolism. Admit to telemetry, was start the patient on IV Lasix 40 mg twice daily. Get echocardiogram, serial troponins and repeat an EKG in the morning. All resume losartan. Watch electrolytes and creatinine on IV Lasix. 06/11/2017: Doppler ultrasound done was inconclusive.  We will continue anticoagulation apparently, the patient says stopped taking her Coumadin for about a week prior to admission.    Echocardiogram -  "Left ventricle: The cavity size was normal. There was mild   concentric hypertrophy. Systolic function was hyperdynamic. The   estimated ejection fraction was in the range of 70% to 75%. There   was dynamic obstruction. There was dynamic obstruction at restin   the mid cavity, with a peak velocity of 250 cm/sec and a peak   gradient of 25 mm Hg. Wall motion was normal;  there were no   regional wall motion abnormalities. Left ventricular diastolic   function parameters were normal. - Ventricular septum: The contour showed diastolic flattening and   systolic flattening. These changes are consistent with RV volume   and pressure overload. - Right ventricle: The cavity size was moderately dilated. Wall   thickness was normal. - Right atrium: The atrium was moderately dilated. - Tricuspid valve: There was moderate-severe regurgitation directed   centrally. - Pulmonary arteries: Systolic pressure was severely increased. PA   peak pressure: 80 mm Hg (S).  Impressions:  - Findings suggest chronic cor pulmonale, but superimposed acute   event (e.g. pulmonary embolism, acute hypoxic respiratory   failure, etc.) is likely present. Right heart overload findings   have worsened ince the precious study".   Acute on chronic respiratory failure with hypoxia:  -See above -This likely multifactorial. -Patient is morbidly obese. -Patient likely has OSA/OHS. -Patient has had problems with thromboembolic disease. -Patient is not compliant with anticoagulation.  Therefore, patient is at risk for recurrent thrombosis and embolism. -INR today is 1.73.  Continue anticoagulation until INR is optimize. -Long-term prognosis is guarded.   Morbid obesity outpatient follow-up with PCP for lifestyle modification and weight loss.  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  Patient is on Coumadin but her INR is subtherapeutic we will we will add Lovenox as bridge continue dosing Coumadin as per pharmacy.    Hypertension well-controlled  Diabetes Get hemoglobin A1c continue with SSRI.  Holding home medications at this time.  DVT prophylaxis: Lovenox and Coumadin Code Status: Full code Family Communication:   Mother. Disposition Plan:  This will depend on hospital course.  Consults called: None   Consultants:   None  Procedures:  ECHO: See above  Antimicrobials:   None   Subjective: No new complaints.  No chest pain, no shortness of breath.  Left leg pain has resolved.  Will likely discharge patient when the patient INR is optimized.    Objective: Vitals:   06/13/17 0517 06/13/17 0908 06/13/17 0909 06/13/17 1530  BP: (!) 124/59   (!) 103/56  Pulse: 95   89  Resp: 20   19  Temp: 98.7 F (37.1 C)   98 F (36.7 C)  TempSrc: Oral   Oral  SpO2: 96% (!) 85% 92% 98%  Weight: (!) 195 kg (429 lb 14.4 oz)     Height:        Intake/Output Summary (Last 24 hours) at 06/13/2017 2021 Last data filed at 06/13/2017 1944 Gross per 24 hour  Intake 720 ml  Output 3700 ml  Net -2980 ml   Filed Weights   06/11/17 0420 06/12/17 0617 06/13/17 0517  Weight: (!) 198 kg (436 lb 8.2 oz) (!) 197.3 kg (434 lb 15.5 oz) (!) 195 kg (429 lb 14.4 oz)    Examination:  General exam: Appears calm and comfortable.  Morbidly obese. Respiratory system: Decreased air entry globally.   Cardiovascular system: S1 & S2 Gastrointestinal system: Abdomen is morbidly obese, soft and nontender.  Organs are difficult to assess.   Central nervous system: Alert and oriented. No focal neurological deficits. Extremities: Symmetric 5 x 5 power.  Lower extremity edema. Psychiatry: Judgement and insight appear normal. Mood & affect appropriate.     Data Reviewed: I have personally reviewed following labs and imaging studies  CBC: Recent Labs  Lab 06/10/17 0945 06/11/17 0510 06/12/17 0550  WBC 7.8 7.4 8.1  NEUTROABS 5.5  --  5.5  HGB 13.4 13.5 13.8  HCT 46.0 48.7* 48.1*  MCV 90.2 93.7 92.9  PLT 266 336 643   Basic Metabolic Panel: Recent Labs  Lab 06/10/17 0945 06/11/17 0510 06/12/17 0550 06/13/17 0550  NA 140 140 138 136  K 4.6 4.3 4.7 4.3  CL 100* 98* 96* 95*  CO2 33* 34* 32 33*  GLUCOSE 105*  109* 112* 111*  BUN 12 11 12 13   CREATININE 0.89 0.85 1.00 0.98  CALCIUM 8.5* 8.8* 8.2* 8.4*  PHOS  --   --  5.0* 3.4   GFR: Estimated Creatinine Clearance: 134.8 mL/min (by C-G formula based on SCr of 0.98 mg/dL). Liver Function Tests: Recent Labs  Lab 06/12/17 0550 06/13/17 0550  ALBUMIN 3.1* 3.1*   No results for input(s): LIPASE, AMYLASE in the last 168 hours. No results for input(s): AMMONIA in the last 168 hours. Coagulation Profile: Recent Labs  Lab 06/10/17 0945 06/11/17 0510 06/12/17 0550 06/13/17 0550  INR 1.46 1.66 1.74 1.73   Cardiac Enzymes: No results for input(s): CKTOTAL, CKMB, CKMBINDEX, TROPONINI in the last 168 hours. BNP (last 3 results) No results for input(s): PROBNP in the last 8760 hours. HbA1C: No results for input(s): HGBA1C in the last 72 hours. CBG: Recent Labs  Lab 06/12/17 1632 06/12/17 2223 06/13/17 0744 06/13/17 1149 06/13/17 1638  GLUCAP 108* 117* 104* 108* 99   Lipid Profile: No results for input(s): CHOL, HDL, LDLCALC, TRIG, CHOLHDL, LDLDIRECT in the last 72 hours. Thyroid Function Tests: No results for input(s): TSH, T4TOTAL, FREET4, T3FREE, THYROIDAB  in the last 72 hours. Anemia Panel: No results for input(s): VITAMINB12, FOLATE, FERRITIN, TIBC, IRON, RETICCTPCT in the last 72 hours. Urine analysis:    Component Value Date/Time   COLORURINE YELLOW 05/18/2016 1532   APPEARANCEUR CLEAR 05/18/2016 1532   LABSPEC 1.005 05/18/2016 1532   PHURINE 7.0 05/18/2016 1532   GLUCOSEU NEGATIVE 05/18/2016 1532   HGBUR LARGE (A) 05/18/2016 1532   BILIRUBINUR negative 04/04/2017 1642   KETONESUR negative 04/04/2017 1642   KETONESUR NEGATIVE 05/18/2016 1532   PROTEINUR trace (A) 04/04/2017 1642   PROTEINUR NEGATIVE 05/18/2016 1532   UROBILINOGEN 0.2 04/04/2017 1642   UROBILINOGEN 1.0 04/25/2012 1908   NITRITE Negative 04/04/2017 1642   NITRITE NEGATIVE 05/18/2016 1532   LEUKOCYTESUR Negative 04/04/2017 1642   Sepsis  Labs: @LABRCNTIP (procalcitonin:4,lacticidven:4)  )No results found for this or any previous visit (from the past 240 hour(s)).       Radiology Studies: No results found.      Scheduled Meds: . chlorhexidine  15 mL Mouth Rinse BID  . dextromethorphan-guaiFENesin  1 tablet Oral BID  . enoxaparin (LOVENOX) injection  200 mg Subcutaneous Q12H  . furosemide  40 mg Intravenous BID  . gabapentin  300 mg Oral BID  . insulin aspart  0-9 Units Subcutaneous TID WC  . losartan  100 mg Oral Daily  . mouth rinse  15 mL Mouth Rinse q12n4p  . sodium chloride flush  3 mL Intravenous Q12H  . Warfarin - Pharmacist Dosing Inpatient   Does not apply q1800   Continuous Infusions: . sodium chloride       LOS: 3 days    Time spent: 35 minutes.    Dana Allan, MD  [facility] Pager #: [phone] 7PM-7AM contact night coverage as above

## 2017-06-14 LAB — RENAL FUNCTION PANEL
Albumin: 3.1 g/dL — ABNORMAL LOW (ref 3.5–5.0)
Anion gap: 8 (ref 5–15)
BUN: 13 mg/dL (ref 6–20)
CO2: 35 mmol/L — ABNORMAL HIGH (ref 22–32)
Calcium: 8.5 mg/dL — ABNORMAL LOW (ref 8.9–10.3)
Chloride: 92 mmol/L — ABNORMAL LOW (ref 101–111)
Creatinine, Ser: 0.86 mg/dL (ref 0.44–1.00)
GFR calc Af Amer: >60 mL/min (ref >60)
GFR calc non Af Amer: >60 mL/min (ref >60)
Glucose, Bld: 100 mg/dL — ABNORMAL HIGH (ref 65–99)
Phosphorus: 4.3 mg/dL (ref 2.5–4.6)
Potassium: 4.3 mmol/L (ref 3.5–5.1)
Sodium: 135 mmol/L (ref 135–145)

## 2017-06-14 LAB — PROTIME-INR
INR: 1.75
Prothrombin Time: 20.3 seconds — ABNORMAL HIGH (ref 11.4–15.2)

## 2017-06-14 LAB — CBC
HEMATOCRIT: 48.7 % — AB (ref 36.0–46.0)
HEMOGLOBIN: 13.9 g/dL (ref 12.0–15.0)
MCH: 26.3 pg (ref 26.0–34.0)
MCHC: 28.5 g/dL — AB (ref 30.0–36.0)
MCV: 92.2 fL (ref 78.0–100.0)
Platelets: 280 10*3/uL (ref 150–400)
RBC: 5.28 MIL/uL — AB (ref 3.87–5.11)
RDW: 18.8 % — ABNORMAL HIGH (ref 11.5–15.5)
WBC: 5.6 10*3/uL (ref 4.0–10.5)

## 2017-06-14 LAB — GLUCOSE, CAPILLARY
GLUCOSE-CAPILLARY: 97 mg/dL (ref 65–99)
GLUCOSE-CAPILLARY: 93 mg/dL (ref 65–99)
GLUCOSE-CAPILLARY: 97 mg/dL (ref 65–99)
GLUCOSE-CAPILLARY: 94 mg/dL (ref 65–99)

## 2017-06-14 MED ORDER — WARFARIN SODIUM 2 MG PO TABS
17.0000 mg | ORAL_TABLET | Freq: Once | ORAL | Status: AC
  Administered 2017-06-14: 17 mg via ORAL
  Filled 2017-06-14: qty 1

## 2017-06-14 NOTE — Progress Notes (Signed)
ANTICOAGULATION CONSULT NOTE - Follow Up Consult  Pharmacy Consult for lovenox and warfarin Indication: hx pulmonary embolus and DVT, aortic thrombus  Allergies  Allergen Reactions  . Penicillins Hives    Has patient had a PCN reaction causing immediate rash, facial/tongue/throat swelling, SOB or lightheadedness with hypotension: No Has patient had a PCN reaction causing severe rash involving mucus membranes or skin necrosis: No Has patient had a PCN reaction that required hospitalization No Has patient had a PCN reaction occurring within the last 10 years: No If all of the above answers are "NO", then may proceed with Cephalosporin use.    Patient Measurements: Height: 5\' 4"  (162.6 cm) Weight: (!) 429 lb 3.8 oz (194.7 kg) IBW/kg (Calculated) : 54.7 Heparin Dosing Weight:   Vital Signs: Temp: 98.5 F (36.9 C) (03/22 0501) Temp Source: Oral (03/22 0501) BP: 121/59 (03/22 0501) Pulse Rate: 92 (03/22 0501)  Labs: Recent Labs    06/12/17 0550 06/12/17 1424 06/13/17 0550 06/14/17 0455 06/14/17 0456  HGB 13.8  --   --  13.9  --   HCT 48.1*  --   --  48.7*  --   PLT 279  --   --  280  --   LABPROT 20.2*  --  20.1* 20.3*  --   INR 1.74  --  1.73 1.75  --   HEPARINUNFRC  --  1.02*  --   --   --   CREATININE 1.00  --  0.98  --  0.86    Estimated Creatinine Clearance: 153.5 mL/min (by C-G formula based on SCr of 0.86 mg/dL).   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  INR was subtherapeutic on admission. Warfarin resumed on admission and Lovenox started for bridging since INR was subtherapeutic.  3/19: unable to perform LE doppler d/t baby habitus 3/20: Anti-X level drawn 4 hrs after lovenox dose -- > therapeutic at 1.02  Today, 06/14/2017: - INR remains sub-therapeutic at 1.75 - cbc  stable - no significant drug-drug intxns - no bleeding documented  Goal of Therapy:  INR 2-3 Anti-Xa level 0.6-1 units/ml 4hrs after LMWH dose given Monitor platelets by anticoagulation protocol: Yes   Plan:  -  warfarin dose to 17 mg PO x1 today (~1.5x home dose) - continue lovenox 200 mg SQ q12h - monitor for s/s bleeding  Tyke Outman P 06/14/2017,8:31 AM

## 2017-06-14 NOTE — Progress Notes (Signed)
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  The patient was admitted with shortness of breath.  Patient also reported worsening left lower extremity edema with pain.  Cardiac BNP is mildly elevated at 177.  The patient is currently on anticoagulation.  No d-dimer visualized.  Doppler ultrasound was said to be inconclusive due to the patient's body habitus.  We will go ahead and check d-dimer, and if elevated, will proceed with CT of the chest.  As mentioned above, the patient is already anticoagulated.  Assessment & Plan:   Active Problems:   Acute CHF (congestive heart failure) (HCC)   Dyspnea on exertion, worsening bilateral pedal edema, elevated BNP, chest x-ray showing cardiomegaly and interstitial edema: Differential include acute CHF versus pulmonary hypertension from pulmonary embolism. Admit to telemetry, was start the patient on IV Lasix 40 mg twice daily. Get echocardiogram, serial troponins and repeat an EKG in the morning. All resume losartan. Watch electrolytes and creatinine on IV Lasix. 06/11/2017: Doppler ultrasound done was inconclusive.  We will continue anticoagulation apparently, the patient says stopped taking her Coumadin for about a week prior to admission.    Echocardiogram -  "Left ventricle: The cavity size was normal. There was mild   concentric hypertrophy. Systolic function was hyperdynamic. The   estimated ejection fraction was in the range of 70% to 75%. There   was dynamic obstruction. There was dynamic obstruction at restin   the mid cavity, with a peak velocity of 250 cm/sec and a peak   gradient of 25 mm Hg. Wall motion was normal;  there were no   regional wall motion abnormalities. Left ventricular diastolic   function parameters were normal. - Ventricular septum: The contour showed diastolic flattening and   systolic flattening. These changes are consistent with RV volume   and pressure overload. - Right ventricle: The cavity size was moderately dilated. Wall   thickness was normal. - Right atrium: The atrium was moderately dilated. - Tricuspid valve: There was moderate-severe regurgitation directed   centrally. - Pulmonary arteries: Systolic pressure was severely increased. PA   peak pressure: 80 mm Hg (S).  Impressions:  - Findings suggest chronic cor pulmonale, but superimposed acute   event (e.g. pulmonary embolism, acute hypoxic respiratory   failure, etc.) is likely present. Right heart overload findings   have worsened ince the precious study".   Acute on chronic respiratory failure with hypoxia:  -See above -This likely multifactorial. -Patient is morbidly obese. -Patient likely has OSA/OHS. -Patient has had problems with thromboembolic disease. -Patient is not compliant with anticoagulation.  Therefore, patient is at risk for recurrent thrombosis and embolism. -INR today is 1.75.  Continue anticoagulation until INR is optimize. -Long-term prognosis is guarded.   Morbid obesity outpatient follow-up with PCP for lifestyle modification and weight loss.  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  Patient is on Coumadin but her INR is subtherapeutic we will we will add Lovenox as bridge continue dosing Coumadin as per pharmacy.    Hypertension well-controlled  Diabetes Get hemoglobin A1c continue with SSRI.  Holding home medications at this time.  DVT prophylaxis: Lovenox and Coumadin Code Status: Full code Family Communication:   Mother. Disposition Plan:  This will depend on hospital course.  Consults called: None   Consultants:   None  Procedures:  ECHO: See above  Antimicrobials:   None   Subjective: No new complaints.  No chest pain, no shortness of breath.  Left leg pain has resolved.  Will likely discharge patient when the patient INR is optimized.    Objective: Vitals:   06/13/17 0909 06/13/17 1530 06/13/17 2100 06/14/17 0501  BP:  (!) 103/56 (!) 111/54 (!) 121/59  Pulse:  89 85 92  Resp:  19 (!) 22 20  Temp:  98 F (36.7 C) 98.6 F (37 C) 98.5 F (36.9 C)  TempSrc:  Oral Oral Oral  SpO2: 92% 98% 96% 100%  Weight:    (!) 194.7 kg (429 lb 3.8 oz)  Height:        Intake/Output Summary (Last 24 hours) at 06/14/2017 0836 Last data filed at 06/14/2017 1610 Gross per 24 hour  Intake 720 ml  Output 3900 ml  Net -3180 ml   Filed Weights   06/12/17 0617 06/13/17 0517 06/14/17 0501  Weight: (!) 197.3 kg (434 lb 15.5 oz) (!) 195 kg (429 lb 14.4 oz) (!) 194.7 kg (429 lb 3.8 oz)    Examination:  General exam: Appears calm and comfortable.  Morbidly obese. Respiratory system: Decreased air entry globally.   Cardiovascular system: S1 & S2 Gastrointestinal system: Abdomen is morbidly obese, soft and nontender.  Organs are difficult to assess.   Central nervous system: Alert and oriented. No focal neurological deficits. Extremities: Symmetric 5 x 5 power.  Lower extremity edema. Psychiatry: Judgement and insight appear normal. Mood & affect appropriate.     Data Reviewed: I have personally reviewed following labs and imaging studies  CBC: Recent Labs  Lab 06/10/17 0945 06/11/17 0510 06/12/17 0550 06/14/17 0455  WBC 7.8 7.4 8.1 5.6  NEUTROABS 5.5  --  5.5  --   HGB 13.4 13.5 13.8 13.9  HCT 46.0 48.7* 48.1* 48.7*  MCV 90.2 93.7 92.9 92.2  PLT 266 336 279 960   Basic Metabolic Panel: Recent Labs  Lab 06/10/17 0945 06/11/17 0510 06/12/17 0550 06/13/17 0550 06/14/17 0456  NA  140 140 138 136 135  K 4.6 4.3 4.7 4.3 4.3  CL 100* 98* 96* 95* 92*  CO2 33* 34* 32 33* 35*  GLUCOSE 105* 109* 112* 111* 100*  BUN 12 11 12 13 13   CREATININE 0.89 0.85 1.00 0.98 0.86  CALCIUM 8.5* 8.8* 8.2* 8.4* 8.5*  PHOS  --   --  5.0* 3.4 4.3   GFR: Estimated Creatinine Clearance: 153.5 mL/min (by C-G formula based on SCr of 0.86 mg/dL). Liver Function Tests: Recent Labs  Lab 06/12/17 0550 06/13/17 0550 06/14/17 0456  ALBUMIN 3.1* 3.1* 3.1*   No results for input(s): LIPASE, AMYLASE in the last 168 hours. No results for input(s): AMMONIA in the last 168 hours. Coagulation Profile: Recent Labs  Lab 06/10/17 0945 06/11/17 0510 06/12/17 0550 06/13/17 0550 06/14/17 0455  INR 1.46 1.66 1.74 1.73 1.75   Cardiac Enzymes: No results for input(s): CKTOTAL, CKMB, CKMBINDEX, TROPONINI in the last 168 hours. BNP (last 3 results) No results for input(s): PROBNP in the last 8760 hours. HbA1C: No results for input(s): HGBA1C in the last 72 hours. CBG: Recent Labs  Lab 06/13/17 0744 06/13/17 1149 06/13/17 1638 06/13/17 2155 06/14/17 0736  GLUCAP 104* 108* 99 104* 97  Lipid Profile: No results for input(s): CHOL, HDL, LDLCALC, TRIG, CHOLHDL, LDLDIRECT in the last 72 hours. Thyroid Function Tests: No results for input(s): TSH, T4TOTAL, FREET4, T3FREE, THYROIDAB in the last 72 hours. Anemia Panel: No results for input(s): VITAMINB12, FOLATE, FERRITIN, TIBC, IRON, RETICCTPCT in the last 72 hours. Urine analysis:    Component Value Date/Time   COLORURINE YELLOW 05/18/2016 1532   APPEARANCEUR CLEAR 05/18/2016 1532   LABSPEC 1.005 05/18/2016 1532   PHURINE 7.0 05/18/2016 1532   GLUCOSEU NEGATIVE 05/18/2016 1532   HGBUR LARGE (A) 05/18/2016 1532   BILIRUBINUR negative 04/04/2017 1642   KETONESUR negative 04/04/2017 1642   KETONESUR NEGATIVE 05/18/2016 1532   PROTEINUR trace (A) 04/04/2017 1642   PROTEINUR NEGATIVE 05/18/2016 1532   UROBILINOGEN 0.2 04/04/2017 1642    UROBILINOGEN 1.0 04/25/2012 1908   NITRITE Negative 04/04/2017 1642   NITRITE NEGATIVE 05/18/2016 1532   LEUKOCYTESUR Negative 04/04/2017 1642   Sepsis Labs: @LABRCNTIP (procalcitonin:4,lacticidven:4)  )No results found for this or any previous visit (from the past 240 hour(s)).       Radiology Studies: No results found.      Scheduled Meds: . chlorhexidine  15 mL Mouth Rinse BID  . dextromethorphan-guaiFENesin  1 tablet Oral BID  . enoxaparin (LOVENOX) injection  200 mg Subcutaneous Q12H  . furosemide  40 mg Intravenous BID  . gabapentin  300 mg Oral BID  . insulin aspart  0-9 Units Subcutaneous TID WC  . losartan  100 mg Oral Daily  . mouth rinse  15 mL Mouth Rinse q12n4p  . sodium chloride flush  3 mL Intravenous Q12H  . warfarin  17 mg Oral ONCE-1800  . Warfarin - Pharmacist Dosing Inpatient   Does not apply q1800   Continuous Infusions: . sodium chloride       LOS: 4 days    Time spent: 35 minutes.    Dana Allan, MD  [facility] Pager #: [phone] 7PM-7AM contact night coverage as above

## 2017-06-15 LAB — GLUCOSE, CAPILLARY
Glucose-Capillary: 115 mg/dL — ABNORMAL HIGH (ref 65–99)
Glucose-Capillary: 99 mg/dL (ref 65–99)

## 2017-06-15 LAB — PROTIME-INR
INR: 2.07
PROTHROMBIN TIME: 23.1 s — AB (ref 11.4–15.2)

## 2017-06-15 MED ORDER — WARFARIN SODIUM 7.5 MG PO TABS
17.0000 mg | ORAL_TABLET | Freq: Once | ORAL | Status: AC
  Administered 2017-06-15: 17 mg via ORAL
  Filled 2017-06-15: qty 1

## 2017-06-15 MED ORDER — FUROSEMIDE 10 MG/ML IJ SOLN: 40.0000 mg | Freq: Once | INTRAMUSCULAR | Status: DC

## 2017-06-15 NOTE — Progress Notes (Signed)
Pt ambulated hall. Spo2 >95 RA while  Ambulating. At one point SP02 decreased to 86% but quickly increased to >95%. Pt remained asymptomatic. Had no complaints while walking. Tolerated well. Now sitting up in chair on RA SPO2 >95%. Will cont to monitor

## 2017-06-15 NOTE — Progress Notes (Signed)
ANTICOAGULATION CONSULT NOTE - Follow Up Consult  Pharmacy Consult for  warfarin Indication: hx pulmonary embolus and DVT, aortic thrombus  Allergies  Allergen Reactions  . Penicillins Hives    Has patient had a PCN reaction causing immediate rash, facial/tongue/throat swelling, SOB or lightheadedness with hypotension: No Has patient had a PCN reaction causing severe rash involving mucus membranes or skin necrosis: No Has patient had a PCN reaction that required hospitalization No Has patient had a PCN reaction occurring within the last 10 years: No If all of the above answers are "NO", then may proceed with Cephalosporin use.    Patient Measurements: Height: 5\' 4"  (162.6 cm) Weight: (!) 419 lb 11.2 oz (190.4 kg) IBW/kg (Calculated) : 54.7 Heparin Dosing Weight:   Vital Signs: Temp: 98.6 F (37 C) (03/23 0527) Temp Source: Oral (03/23 0527) BP: 134/72 (03/23 0527) Pulse Rate: 93 (03/23 0527)  Labs: Recent Labs    06/12/17 1424 06/13/17 0550 06/14/17 0455 06/14/17 0456 06/15/17 0511  HGB  --   --  13.9  --   --   HCT  --   --  48.7*  --   --   PLT  --   --  280  --   --   LABPROT  --  20.1* 20.3*  --  23.1*  INR  --  1.73 1.75  --  2.07  HEPARINUNFRC 1.02*  --   --   --   --   CREATININE  --  0.98  --  0.86  --     Estimated Creatinine Clearance: 151.1 mL/min (by C-G formula based on SCr of 0.86 mg/dL).   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  INR was subtherapeutic on admission. Warfarin resumed on admission and Lovenox started for bridging since INR was subtherapeutic.  3/19: unable to perform LE doppler d/t baby habitus 3/20: Anti-X level drawn 4 hrs after lovenox dose -- > therapeutic at 1.02  Today, 06/15/2017: - INR is now therapeutic at 2.07 - cbc stable - no significant  drug-drug intxns - no bleeding documented  Goal of Therapy:  INR 2-3 Anti-Xa level 0.6-1 units/ml 4hrs after LMWH dose given Monitor platelets by anticoagulation protocol: Yes   Plan:  - Warfarin dose to 17 mg PO x1 today  - Stop enoxaparin now as pt's INR is therapeutic  - monitor for s/s bleeding   Royetta Asal, PharmD, BCPS Pager [phone] 06/15/2017 10:02 AM

## 2017-06-15 NOTE — Discharge Instructions (Signed)
Heart Failure and Exercise °Heart failure is a condition in which the heart does not fill or pump enough blood and oxygen to support your body and its functions. Heart failure is a long-term (chronic) condition. Living with heart failure can be challenging. However, following your health care provider's instructions about a healthy lifestyle may help improve your symptoms. This includes choosing the right exercise plan. °Doing daily physical activity is important after a diagnosis of heart failure. You may have some activity restrictions, so talk to your health care provider before doing any exercises. °What are the benefits of exercise? °Exercise may: °· Make your heart muscles stronger. °· Lower your blood pressure. °· Lower your cholesterol. °· Help you lose weight. °· Help your bones stay strong. °· Improve your blood circulation. °· Help your body use oxygen better. This relieves symptoms such as fatigue and shortness of breath. °· Help your mental health by lowering the risk of depression and other problems. °· Improve your quality of life. °· Decrease your chance of hospital admission for heart failure. ° °What is an exercise plan? °An exercise plan is a set of specific exercises and training activities. You will work with your health care provider to create the exercise plan that works for you. The plan may include: °· Different types of exercises and how to do them. °· Cardiac rehabilitation exercises. These are supervised programs that are designed to strengthen your heart. ° °What are strengthening exercises? °Strengthening exercises are a type of physical activity that involves using resistance to improve your muscle strength. Strengthening exercises usually have repetitive motions. These types of exercises can include: °· Lifting weights. °· Using weight machines. °· Using resistance tubes and bands. °· Using kettlebells. °· Using your body weight, such as doing push-ups or squats. ° °What are balance  exercises? °Balance exercises are another type of physical activity. They strengthen the muscles of the back, stomach, and pelvis (core muscles) and improve your balance. They can also lower your risk of falling. These types of exercises can include: °· Standing on one leg. °· Walking backward, sideways, and in a straight line. °· Standing up after sitting, without using your hands. °· Shifting your weight from one leg to the other. °· Lifting one leg in front of you. °· Doing tai chi. This is a type of exercise that uses slow movements and deep breathing. ° °How can I increase my flexibility? °Having better flexibility can keep you from falling. It can also lengthen your muscles, improve your range of motion, and help your joints. You can increase your flexibility by: °· Doing tai chi. °· Doing yoga. °· Stretching. ° °How much aerobic exercise should I get? °Aerobic exercises strengthen your breathing and circulation system and increase your body's use of oxygen. Examples of aerobic exercise include biking, walking, running, and swimming. Talk to your health care provider to find out how much aerobic exercise is safe for you.  °To do these exercises: °· Start exercising slowly, limiting the amount of time at first. You may need to start with 5 minutes of aerobic exercise every day. °· Slowly add more minutes until you can safely do at least 30 minutes of exercise at least 4 days a week. ° °Summary °· Daily physical activity is important after a diagnosis of heart failure. °· Exercise can make your heart muscles stronger. It also offers other benefits that will improve your health. °· Talk to your health care provider before doing any exercises. °This information is   not intended to replace advice given to you by your health care provider. Make sure you discuss any questions you have with your health care provider. °Document Released: 07/24/2016 Document Revised: 07/24/2016 Document Reviewed: 07/24/2016 °[employer]  Interactive Patient Education © 2018 [employer] ° °

## 2017-06-15 NOTE — Progress Notes (Signed)
AVS with direction to take coumadin 1.5 to 2 tabs as directed by coumadin clinic. Coumadin clinic closed on 3/24. Called pharmacist Merrilee Seashore. Merrilee Seashore with instructions for pt to take 15mg  (2 tabs) on Sunday. Updated MD. Educated pt. Pt familiar with coumadin clinic and understands to take 15 mg 3/24 then follow up with clinic on 3/25.

## 2017-06-15 NOTE — Discharge Summary (Signed)
Physician Discharge Summary  Alexa [name]:[id number] DOB: [birth year] DOA: 06/10/2017  PCP: Ivar Drape D, PA  Admit date: 06/10/2017 Discharge date: 06/15/2017  Admitted From: Home Disposition:  Home  Recommendations for Outpatient Follow-up:  1. Follow up with PCP in 1-2 weeks 2. Please obtain BMP/CBC in one week 3. Please follow up with the heart failure clinic on discharge.   Home Health: No Equipment/Devices none  Discharge Condition: Stable CODE STATUS: Full Diet recommendation: Sodium restricted, fluid restricted  Brief/Interim Summary:  #) Acute hypoxic respiratory failure secondary to acute diastolic heart failure: Patient was admitted with acute diastolic heart failure exacerbation.  She was given IV furosemide 40 mg twice daily with good diuresis and resolution of hypoxia.  Her weight decreased and her lower extremity edema improved.  Echo done on 06/11/2017 showed hyperdynamic systolic function with an EF of 70-75% as well as a dynamic obstruction in the mid cavity gradient of approximately 22 mmHg.  There is evidence of right ventricular volume overload and dilation of the right ventricle.  There was also moderate to severe tricuspid regurgitation.  Patient was given outpatient follow-up for her dynamic LVOT obstruction and pulmonary hypertension.  Patient was told to double up on her Lasix dose and was given outpatient follow-up with congestive heart failure clinic.  #) Pulmonary hypertension: Most likely secondary to history of PE and chronic untreated obstructive sleep apnea.  This was new from prior.  Patient will have outpatient follow-up with cardiology.  #) DVT/PE: Patient was on warfarin on admission.  She was subtherapeutic on INR.  She was started on Lovenox bridge back to warfarin she was discharged to a therapeutic INR.  She will have outpatient follow-up with hematology/oncology.  #) Hypertension: Patient was continued on home losartan 100 mg.  #)  Type 2 diabetes: Patient was restarted on home metformin on discharge.  She was maintained on sliding scale insulin while inpatient.  #) Psych/pain: Patient was continued on gabapentin 300 mg twice a day.   Discharge Diagnoses:  Active Problems:   Acute CHF (congestive heart failure) [facility])    Discharge Instructions  Discharge Instructions    (HEART FAILURE PATIENTS) Call MD:  Anytime you have any of the following symptoms: 1) 3 pound weight gain in 24 hours or 5 pounds in 1 week 2) shortness of breath, with or without a dry hacking cough 3) swelling in the hands, feet or stomach 4) if you have to sleep on extra pillows at night in order to breathe.   Complete by:  As directed    AMB referral to CHF clinic   Complete by:  As directed    Call MD for:  difficulty breathing, headache or visual disturbances   Complete by:  As directed    Call MD for:  persistant nausea and vomiting   Complete by:  As directed    Call MD for:  temperature >100.4   Complete by:  As directed    Diet - low sodium heart healthy   Complete by:  As directed    Discharge instructions   Complete by:  As directed    Please follow-up with your primary care doctor in 1 week.  Please increase your Lasix (furosemide) dose from once daily to twice daily if you notice a 5 pound weight gain or any swelling of her legs.  Continue that twice a day dose for 3 or 4 days and call your PCP and let them know.   Increase activity slowly  Complete by:  As directed      Allergies as of 06/15/2017      Reactions   Penicillins Hives   Has patient had a PCN reaction causing immediate rash, facial/tongue/throat swelling, SOB or lightheadedness with hypotension: No Has patient had a PCN reaction causing severe rash involving mucus membranes or skin necrosis: No Has patient had a PCN reaction that required hospitalization No Has patient had a PCN reaction occurring within the last 10 years: No If all of the above answers are "NO",  then may proceed with Cephalosporin use.      Medication List    STOP taking these medications   predniSONE 20 MG tablet Commonly known as:  DELTASONE     TAKE these medications   cetirizine 10 MG tablet Commonly known as:  ZYRTEC Take 1 tablet (10 mg total) by mouth daily.   ferrous sulfate 325 (65 FE) MG tablet Take 325 mg by mouth daily with breakfast.   furosemide 40 MG tablet Commonly known as:  LASIX Take 1 tablet (40 mg total) by mouth daily.   gabapentin 300 MG capsule Commonly known as:  NEURONTIN Take 1 capsule (300 mg total) by mouth 2 (two) times daily.   glucose blood test strip ascensia  Meter/strips preferred Test daily Use as instructed Dx.dm   losartan 100 MG tablet Commonly known as:  COZAAR take 1 tablet by mouth once daily   metFORMIN 500 MG tablet Commonly known as:  GLUCOPHAGE take 1 tablet by mouth twice a day with meals   nystatin powder Commonly known as:  MYCOSTATIN/NYSTOP Apply topically 4 (four) times daily.   onetouch ultrasoft lancets Test daily Use as instructed dm2   warfarin 7.5 MG tablet Commonly known as:  COUMADIN Take as directed. If you are unsure how to take this medication, talk to your nurse or doctor. Original instructions:  take 1 and 1/2 tablet to 2 tablets once daily as directed BY COUMADIN CLINIC       Allergies  Allergen Reactions  . Penicillins Hives    Has patient had a PCN reaction causing immediate rash, facial/tongue/throat swelling, SOB or lightheadedness with hypotension: No Has patient had a PCN reaction causing severe rash involving mucus membranes or skin necrosis: No Has patient had a PCN reaction that required hospitalization No Has patient had a PCN reaction occurring within the last 10 years: No If all of the above answers are "NO", then may proceed with Cephalosporin use.    Consultations:  None   Procedures/Studies: Dg Chest 2 View  Result Date: 06/10/2017 CLINICAL DATA:  Cough EXAM:  CHEST - 2 VIEW COMPARISON:  05/18/2016 FINDINGS: Cardiac enlargement with vascular congestion. Negative for edema or effusion. Negative for pneumonia IMPRESSION: Cardiomegaly with vascular congestion.  Negative for edema Electronically Signed   By: Franchot Gallo M.D.   On: 06/10/2017 10:13    06/11/2017 echo:Study Conclusions  - Left ventricle: The cavity size was normal. There was mild   concentric hypertrophy. Systolic function was hyperdynamic. The   estimated ejection fraction was in the range of 70% to 75%. There   was dynamic obstruction. There was dynamic obstruction at restin   the mid cavity, with a peak velocity of 250 cm/sec and a peak   gradient of 25 mm Hg. Wall motion was normal; there were no   regional wall motion abnormalities. Left ventricular diastolic   function parameters were normal. - Ventricular septum: The contour showed diastolic flattening and   systolic flattening.  These changes are consistent with RV volume   and pressure overload. - Right ventricle: The cavity size was moderately dilated. Wall   thickness was normal. - Right atrium: The atrium was moderately dilated. - Tricuspid valve: There was moderate-severe regurgitation directed   centrally. - Pulmonary arteries: Systolic pressure was severely increased. PA   peak pressure: 80 mm Hg (S).  Impressions:  - Findings suggest chronic cor pulmonale, but superimposed acute   event (e.g. pulmonary embolism, acute hypoxic respiratory   failure, etc.) is likely present. Right heart overload findings   have worsened ince the precious study.   Subjective:   Discharge Exam: Vitals:   06/15/17 0527 06/15/17 0528  BP: 134/72   Pulse: 93   Resp: 20   Temp: 98.6 F (37 C)   SpO2: (!) 89% 95%   Vitals:   06/14/17 1308 06/14/17 2116 06/15/17 0527 06/15/17 0528  BP: (!) 126/57 (!) 110/55 134/72   Pulse: 83 81 93   Resp: 20 20 20    Temp: 98.9 F (37.2 C) 98.4 F (36.9 C) 98.6 F (37 C)   TempSrc:  Oral Oral Oral   SpO2: 98% 99% (!) 89% 95%  Weight:   (!) 190.4 kg (419 lb 11.2 oz)   Height:        General: Pt is alert, awake, not in acute distress Cardiovascular: Distant heart sounds, regular rate and rhythm, no murmurs Respiratory: Distant breath sounds due to body habitus, no crackles, rales, wheezes Abdominal: Soft, NT, ND, bowel sounds +, obese Extremities: 2+ lower extremity edema    The results of significant diagnostics from this hospitalization (including imaging, microbiology, ancillary and laboratory) are listed below for reference.     Microbiology: No results found for this or any previous visit (from the past 240 hour(s)).   Labs: BNP (last 3 results) Recent Labs    06/10/17 0946  BNP 086.5*   Basic Metabolic Panel: Recent Labs  Lab 06/10/17 0945 06/11/17 0510 06/12/17 0550 06/13/17 0550 06/14/17 0456  NA 140 140 138 136 135  K 4.6 4.3 4.7 4.3 4.3  CL 100* 98* 96* 95* 92*  CO2 33* 34* 32 33* 35*  GLUCOSE 105* 109* 112* 111* 100*  BUN 12 11 12 13 13   CREATININE 0.89 0.85 1.00 0.98 0.86  CALCIUM 8.5* 8.8* 8.2* 8.4* 8.5*  PHOS  --   --  5.0* 3.4 4.3   Liver Function Tests: Recent Labs  Lab 06/12/17 0550 06/13/17 0550 06/14/17 0456  ALBUMIN 3.1* 3.1* 3.1*   No results for input(s): LIPASE, AMYLASE in the last 168 hours. No results for input(s): AMMONIA in the last 168 hours. CBC: Recent Labs  Lab 06/10/17 0945 06/11/17 0510 06/12/17 0550 06/14/17 0455  WBC 7.8 7.4 8.1 5.6  NEUTROABS 5.5  --  5.5  --   HGB 13.4 13.5 13.8 13.9  HCT 46.0 48.7* 48.1* 48.7*  MCV 90.2 93.7 92.9 92.2  PLT 266 336 279 280   Cardiac Enzymes: No results for input(s): CKTOTAL, CKMB, CKMBINDEX, TROPONINI in the last 168 hours. BNP: Invalid input(s): POCBNP CBG: Recent Labs  Lab 06/14/17 1130 06/14/17 1644 06/14/17 2105 06/15/17 0731 06/15/17 1125  GLUCAP 93 97 94 115* 99   D-Dimer No results for input(s): DDIMER in the last 72 hours. Hgb A1c No  results for input(s): HGBA1C in the last 72 hours. Lipid Profile No results for input(s): CHOL, HDL, LDLCALC, TRIG, CHOLHDL, LDLDIRECT in the last 72 hours. Thyroid function studies No results for  input(s): TSH, T4TOTAL, T3FREE, THYROIDAB in the last 72 hours.  Invalid input(s): FREET3 Anemia work up No results for input(s): VITAMINB12, FOLATE, FERRITIN, TIBC, IRON, RETICCTPCT in the last 72 hours. Urinalysis    Component Value Date/Time   COLORURINE YELLOW 05/18/2016 1532   APPEARANCEUR CLEAR 05/18/2016 1532   LABSPEC 1.005 05/18/2016 1532   PHURINE 7.0 05/18/2016 1532   GLUCOSEU NEGATIVE 05/18/2016 1532   HGBUR LARGE (A) 05/18/2016 1532   BILIRUBINUR negative 04/04/2017 1642   KETONESUR negative 04/04/2017 1642   KETONESUR NEGATIVE 05/18/2016 1532   PROTEINUR trace (A) 04/04/2017 1642   PROTEINUR NEGATIVE 05/18/2016 1532   UROBILINOGEN 0.2 04/04/2017 1642   UROBILINOGEN 1.0 04/25/2012 1908   NITRITE Negative 04/04/2017 1642   NITRITE NEGATIVE 05/18/2016 1532   LEUKOCYTESUR Negative 04/04/2017 1642   Sepsis Labs Invalid input(s): PROCALCITONIN,  WBC,  LACTICIDVEN Microbiology No results found for this or any previous visit (from the past 240 hour(s)).   Time coordinating discharge: Over 30 minutes  SIGNED:   Cristy Folks, MD  [facility] 06/15/2017, 2:19 PM  If 7PM-7AM, please contact night-coverage [email or web address] Password [email or web address]

## 2017-06-15 NOTE — Progress Notes (Signed)
Pt asked for dose of lasix before leaving. MD changed order to give one time dose now. Entered pt room to give lasix. Pt refused bc they are not going straight home after DC. DCd one time dose lasix

## 2017-06-15 NOTE — Progress Notes (Signed)
MD with verbal ok to give 1800 coumadin dose now

## 2017-06-15 NOTE — Progress Notes (Signed)
Pt on 3lnc SPO2 96%. Decreased to 0.5lnc SPO2 88%. Placed on 1 lnc SPO2 >94%. No SOB.

## 2017-06-15 NOTE — Evaluation (Signed)
Physical Therapy Evaluation Patient Details Name: Alexa [name] MRN: [id number] DOB: [birth year] Today's Date: 06/15/2017   History of Present Illness  Alexa [name] is a 42 y.o. 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  She also reported worsening left lower extremity swelling and pain   Clinical Impression  Patient evaluated by Physical Therapy with no further acute PT needs identified. All education has been completed and the patient has no further questions. Pt is at her baseline, no further needs;  See below for any follow-up Physical Therapy or equipment needs. PT is signing off. Thank you for this referral.    Follow Up Recommendations No PT follow up    Equipment Recommendations  None recommended by PT    Recommendations for Other Services       Precautions / Restrictions Precautions Precautions: None Restrictions Weight Bearing Restrictions: No      Mobility  Bed Mobility               General bed mobility comments: per pt and NT--mod I  Transfers Overall transfer level: Modified independent Equipment used: None             General transfer comment: uses rail in bathroom to stand from toilet (no 3in1), no assist needed, slightly incr time  Ambulation/Gait Ambulation/Gait assistance: Min guard Ambulation Distance (Feet): 200 Feet Assistive device: Straight cane Gait Pattern/deviations: Step-through pattern;Wide base of support;Decreased stride length     General Gait Details: pt SpO2= 86% in hallway on RA, returned to 94% while amb, dropped again to 86% amb from bathroom, returned to 99-100% at rest on RA, pt with only mild dyspnea, NT to spot check later and RN aware  Stairs            Wheelchair Mobility    Modified Rankin (Stroke  Patients Only)       Balance     Sitting balance-Leahy Scale: Good     Standing balance support: Single extremity supported;During functional activity   Standing balance comment: NT beyond fair, pt amb iwth wide BOS d/t body habitus, denies falls                             Pertinent Vitals/Pain Pain Assessment: No/denies pain    Home Living Family/patient expects to be discharged to:: Private residence Living Arrangements: Parent   Type of Home: House Home Access: Level entry     Home Layout: One level Home Equipment: [name] - single point Additional Comments: 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    Prior Function Level of Independence: Independent;Independent with assistive device(s)         Comments: amb with cane, sleeps in bed most of the time, breathes better when sleeps more upright, does not have a CPAP     Hand Dominance        Extremity/Trunk Assessment   Upper Extremity Assessment Upper Extremity Assessment: Overall WFL for tasks assessed    Lower Extremity Assessment Lower Extremity Assessment: Overall WFL for tasks assessed       Communication   Communication: No difficulties  Cognition Arousal/Alertness: Awake/alert Behavior During Therapy: WFL for tasks assessed/performed Overall Cognitive Status: Within Functional Limits for tasks assessed  General Comments      Exercises     Assessment/Plan    PT Assessment Patent does not need any further PT services  PT Problem List         PT Treatment Interventions      PT Goals (Current goals can be found in the Care Plan section)  Acute Rehab PT Goals Patient Stated Goal: home soon PT Goal Formulation: All assessment and education complete, DC therapy    Frequency     Barriers to discharge        Co-evaluation               AM-PAC PT "6 Clicks" Daily Activity  Outcome Measure Difficulty turning  over in bed (including adjusting bedclothes, sheets and blankets)?: None Difficulty moving from lying on back to sitting on the side of the bed? : None Difficulty sitting down on and standing up from a chair with arms (e.g., wheelchair, bedside commode, etc,.)?: None Help needed moving to and from a bed to chair (including a wheelchair)?: None Help needed walking in hospital room?: None Help needed climbing 3-5 steps with a railing? : None 6 Click Score: 24    End of Session   Activity Tolerance: Patient tolerated treatment well Patient left: in chair;with nursing/sitter in room;with call bell/phone within reach   PT Visit Diagnosis: Difficulty in walking, not elsewhere classified (R26.2)    Time: 3343-5686 PT Time Calculation (min) (ACUTE ONLY): 15 min   Charges:   PT Evaluation $PT Eval Low Complexity: 1 Low     PT G CodesKenyon Ana, PT Pager: [phone] 06/15/2017   [facility] 06/15/2017, 12:14 PM

## 2017-06-15 NOTE — Progress Notes (Signed)
MD Purohit with verbal order for PT consult.

## 2017-06-17 ENCOUNTER — Ambulatory Visit (INDEPENDENT_AMBULATORY_CARE_PROVIDER_SITE_OTHER): Payer: 59 | Admitting: Pharmacist Clinician (PhC)/ Clinical Pharmacy Specialist

## 2017-06-17 DIAGNOSIS — Z5181 Encounter for therapeutic drug level monitoring: Secondary | ICD-10-CM

## 2017-06-17 DIAGNOSIS — I2699 Other pulmonary embolism without acute cor pulmonale: Secondary | ICD-10-CM | POA: Diagnosis not present

## 2017-06-17 DIAGNOSIS — Z7901 Long term (current) use of anticoagulants: Secondary | ICD-10-CM | POA: Diagnosis not present

## 2017-06-17 LAB — POCT INR: INR: 2

## 2017-06-25 ENCOUNTER — Telehealth: Payer: Self-pay | Admitting: Cardiovascular Disease

## 2017-06-25 NOTE — Telephone Encounter (Signed)
Did not need this encounter °

## 2017-06-26 ENCOUNTER — Other Ambulatory Visit: Payer: Self-pay

## 2017-06-26 ENCOUNTER — Encounter (HOSPITAL_COMMUNITY): Payer: Self-pay

## 2017-06-26 ENCOUNTER — Inpatient Hospital Stay (HOSPITAL_COMMUNITY)
Admission: EM | Admit: 2017-06-26 | Discharge: 2017-06-29 | DRG: 291 | Disposition: A | Discharge status: Home / Self Care | Payer: 59 | Attending: Internal Medicine | Admitting: Internal Medicine

## 2017-06-26 ENCOUNTER — Emergency Department (HOSPITAL_COMMUNITY): Payer: 59

## 2017-06-26 ENCOUNTER — Encounter: Payer: Self-pay | Admitting: Physician Assistant

## 2017-06-26 ENCOUNTER — Ambulatory Visit: Payer: Self-pay | Admitting: *Deleted

## 2017-06-26 DIAGNOSIS — I509 Heart failure, unspecified: Secondary | ICD-10-CM

## 2017-06-26 DIAGNOSIS — Z833 Family history of diabetes mellitus: Secondary | ICD-10-CM | POA: Diagnosis not present

## 2017-06-26 DIAGNOSIS — I272 Pulmonary hypertension, unspecified: Secondary | ICD-10-CM | POA: Diagnosis present

## 2017-06-26 DIAGNOSIS — I2729 Other secondary pulmonary hypertension: Secondary | ICD-10-CM | POA: Diagnosis present

## 2017-06-26 DIAGNOSIS — I110 Hypertensive heart disease with heart failure: Principal | ICD-10-CM | POA: Diagnosis present

## 2017-06-26 DIAGNOSIS — Z86718 Personal history of other venous thrombosis and embolism: Secondary | ICD-10-CM | POA: Diagnosis not present

## 2017-06-26 DIAGNOSIS — I2781 Cor pulmonale (chronic): Secondary | ICD-10-CM

## 2017-06-26 DIAGNOSIS — Z6841 Body Mass Index (BMI) 40.0 and over, adult: Secondary | ICD-10-CM | POA: Diagnosis not present

## 2017-06-26 DIAGNOSIS — I5033 Acute on chronic diastolic (congestive) heart failure: Secondary | ICD-10-CM | POA: Diagnosis present

## 2017-06-26 DIAGNOSIS — Z8249 Family history of ischemic heart disease and other diseases of the circulatory system: Secondary | ICD-10-CM

## 2017-06-26 DIAGNOSIS — J9621 Acute and chronic respiratory failure with hypoxia: Secondary | ICD-10-CM | POA: Diagnosis present

## 2017-06-26 DIAGNOSIS — Z87891 Personal history of nicotine dependence: Secondary | ICD-10-CM | POA: Diagnosis not present

## 2017-06-26 DIAGNOSIS — J9622 Acute and chronic respiratory failure with hypercapnia: Secondary | ICD-10-CM | POA: Diagnosis not present

## 2017-06-26 DIAGNOSIS — I2721 Secondary pulmonary arterial hypertension: Secondary | ICD-10-CM | POA: Diagnosis present

## 2017-06-26 DIAGNOSIS — Z9114 Patient's other noncompliance with medication regimen: Secondary | ICD-10-CM

## 2017-06-26 DIAGNOSIS — Z7901 Long term (current) use of anticoagulants: Secondary | ICD-10-CM

## 2017-06-26 DIAGNOSIS — R791 Abnormal coagulation profile: Secondary | ICD-10-CM | POA: Diagnosis present

## 2017-06-26 DIAGNOSIS — G4733 Obstructive sleep apnea (adult) (pediatric): Secondary | ICD-10-CM | POA: Diagnosis present

## 2017-06-26 DIAGNOSIS — Z7984 Long term (current) use of oral hypoglycemic drugs: Secondary | ICD-10-CM

## 2017-06-26 DIAGNOSIS — I2699 Other pulmonary embolism without acute cor pulmonale: Secondary | ICD-10-CM | POA: Diagnosis not present

## 2017-06-26 DIAGNOSIS — E1140 Type 2 diabetes mellitus with diabetic neuropathy, unspecified: Secondary | ICD-10-CM | POA: Diagnosis present

## 2017-06-26 DIAGNOSIS — R0989 Other specified symptoms and signs involving the circulatory and respiratory systems: Secondary | ICD-10-CM | POA: Diagnosis not present

## 2017-06-26 DIAGNOSIS — J9601 Acute respiratory failure with hypoxia: Secondary | ICD-10-CM | POA: Diagnosis not present

## 2017-06-26 DIAGNOSIS — I5082 Biventricular heart failure: Secondary | ICD-10-CM | POA: Diagnosis present

## 2017-06-26 DIAGNOSIS — Z86711 Personal history of pulmonary embolism: Secondary | ICD-10-CM

## 2017-06-26 DIAGNOSIS — E669 Obesity, unspecified: Secondary | ICD-10-CM | POA: Diagnosis not present

## 2017-06-26 DIAGNOSIS — R609 Edema, unspecified: Secondary | ICD-10-CM

## 2017-06-26 DIAGNOSIS — R0602 Shortness of breath: Secondary | ICD-10-CM | POA: Diagnosis not present

## 2017-06-26 DIAGNOSIS — I50813 Acute on chronic right heart failure: Secondary | ICD-10-CM | POA: Diagnosis not present

## 2017-06-26 DIAGNOSIS — I50811 Acute right heart failure: Secondary | ICD-10-CM

## 2017-06-26 DIAGNOSIS — J209 Acute bronchitis, unspecified: Secondary | ICD-10-CM | POA: Diagnosis not present

## 2017-06-26 LAB — CBC WITH DIFFERENTIAL/PLATELET
BASOS ABS: 0 10*3/uL (ref 0.0–0.1)
Basophils Relative: 0 %
EOS ABS: 0 10*3/uL (ref 0.0–0.7)
EOS PCT: 0 %
HCT: 44.8 % (ref 36.0–46.0)
HEMOGLOBIN: 13.3 g/dL (ref 12.0–15.0)
LYMPHS ABS: 1.7 10*3/uL (ref 0.7–4.0)
LYMPHS PCT: 21 %
MCH: 26.1 pg (ref 26.0–34.0)
MCHC: 29.7 g/dL — ABNORMAL LOW (ref 30.0–36.0)
MCV: 87.8 fL (ref 78.0–100.0)
Monocytes Absolute: 1 10*3/uL (ref 0.1–1.0)
Monocytes Relative: 13 %
NEUTROS PCT: 66 %
Neutro Abs: 5.3 10*3/uL (ref 1.7–7.7)
PLATELETS: 393 10*3/uL (ref 150–400)
RBC: 5.1 MIL/uL (ref 3.87–5.11)
RDW: 18.9 % — ABNORMAL HIGH (ref 11.5–15.5)
WBC: 8 10*3/uL (ref 4.0–10.5)

## 2017-06-26 LAB — BASIC METABOLIC PANEL
ANION GAP: 6 (ref 5–15)
BUN: 21 mg/dL — AB (ref 6–20)
CHLORIDE: 101 mmol/L (ref 101–111)
CO2: 33 mmol/L — ABNORMAL HIGH (ref 22–32)
Calcium: 8.7 mg/dL — ABNORMAL LOW (ref 8.9–10.3)
Creatinine, Ser: 1.03 mg/dL — ABNORMAL HIGH (ref 0.44–1.00)
GFR calc Af Amer: >60 mL/min (ref >60)
Glucose, Bld: 83 mg/dL (ref 65–99)
POTASSIUM: 4.3 mmol/L (ref 3.5–5.1)
SODIUM: 140 mmol/L (ref 135–145)

## 2017-06-26 LAB — I-STAT BETA HCG BLOOD, ED (MC, WL, AP ONLY): I-stat hCG, quantitative: <5 m[IU]/mL (ref <5)

## 2017-06-26 LAB — GLUCOSE, CAPILLARY: GLUCOSE-CAPILLARY: 107 mg/dL — AB (ref 65–99)

## 2017-06-26 LAB — I-STAT TROPONIN, ED: TROPONIN I, POC: 0 ng/mL (ref 0.00–0.08)

## 2017-06-26 LAB — BRAIN NATRIURETIC PEPTIDE: B NATRIURETIC PEPTIDE 5: 136 pg/mL — AB (ref 0.0–100.0)

## 2017-06-26 LAB — PROTIME-INR
INR: 4.04
Prothrombin Time: 39 seconds — ABNORMAL HIGH (ref 11.4–15.2)

## 2017-06-26 MED ORDER — INSULIN ASPART 100 UNIT/ML ~~LOC~~ SOLN: 0.0000 [IU] | Freq: Three times a day (TID) | SUBCUTANEOUS | Status: DC

## 2017-06-26 MED ORDER — FUROSEMIDE 10 MG/ML IJ SOLN
40.0000 mg | Freq: Once | INTRAMUSCULAR | Status: AC
  Administered 2017-06-26: 40 mg via INTRAVENOUS
  Filled 2017-06-26: qty 4

## 2017-06-26 MED ORDER — ACETAMINOPHEN 325 MG PO TABS: 650.0000 mg | ORAL_TABLET | ORAL | Status: DC | PRN

## 2017-06-26 MED ORDER — INSULIN ASPART 100 UNIT/ML ~~LOC~~ SOLN: 0.0000 [IU] | Freq: Every day | SUBCUTANEOUS | Status: DC

## 2017-06-26 MED ORDER — SODIUM CHLORIDE 0.9% FLUSH
3.0000 mL | Freq: Two times a day (BID) | INTRAVENOUS | Status: DC
  Administered 2017-06-26 – 2017-06-29 (×5): 3 mL via INTRAVENOUS

## 2017-06-26 MED ORDER — GABAPENTIN 300 MG PO CAPS
300.0000 mg | ORAL_CAPSULE | Freq: Two times a day (BID) | ORAL | Status: DC
  Administered 2017-06-26 – 2017-06-29 (×6): 300 mg via ORAL
  Filled 2017-06-26 (×6): qty 1

## 2017-06-26 MED ORDER — FUROSEMIDE 10 MG/ML IJ SOLN
20.0000 mg | Freq: Once | INTRAMUSCULAR | Status: AC
  Administered 2017-06-26: 20 mg via INTRAVENOUS
  Filled 2017-06-26: qty 2

## 2017-06-26 MED ORDER — LOSARTAN POTASSIUM 50 MG PO TABS
100.0000 mg | ORAL_TABLET | Freq: Every day | ORAL | Status: DC
  Administered 2017-06-27 – 2017-06-29 (×3): 100 mg via ORAL
  Filled 2017-06-26 (×3): qty 2

## 2017-06-26 MED ORDER — SODIUM CHLORIDE 0.9% FLUSH: 3.0000 mL | INTRAVENOUS | Status: DC | PRN

## 2017-06-26 MED ORDER — SODIUM CHLORIDE 0.9 % IV SOLN: 250.0000 mL | INTRAVENOUS | Status: DC | PRN

## 2017-06-26 MED ORDER — FUROSEMIDE 10 MG/ML IJ SOLN
60.0000 mg | Freq: Two times a day (BID) | INTRAMUSCULAR | Status: DC
  Administered 2017-06-27 – 2017-06-29 (×5): 60 mg via INTRAVENOUS
  Filled 2017-06-26 (×5): qty 6

## 2017-06-26 MED ORDER — ONDANSETRON HCL 4 MG/2ML IJ SOLN: 4.0000 mg | Freq: Four times a day (QID) | INTRAMUSCULAR | Status: DC | PRN

## 2017-06-26 NOTE — Progress Notes (Signed)
ANTICOAGULATION CONSULT NOTE - Initial Consult  Pharmacy Consult for Warfarin Indication: Hx pulmonary embolus and DVT, aortic thrombus  Allergies  Allergen Reactions  . Penicillins Hives    Has patient had a PCN reaction causing immediate rash, facial/tongue/throat swelling, SOB or lightheadedness with hypotension: No Has patient had a PCN reaction causing severe rash involving mucus membranes or skin necrosis: No Has patient had a PCN reaction that required hospitalization No Has patient had a PCN reaction occurring within the last 10 years: No If all of the above answers are "NO", then may proceed with Cephalosporin use.    Patient Measurements: Height: 5\' 5"  (165.1 cm) Weight: (!) 433 lb 3.2 oz (196.5 kg) IBW/kg (Calculated) : 57   Vital Signs: Temp: 99.4 F (37.4 C) (04/03 1939) Temp Source: Oral (04/03 1939) BP: 135/68 (04/03 1939) Pulse Rate: 87 (04/03 1939)  Labs: Recent Labs    06/26/17 1520  HGB 13.3  HCT 44.8  PLT 393  LABPROT 39.0*  INR 4.04*  CREATININE 1.03*    Estimated Creatinine Clearance: 130.6 mL/min (A) (by C-G formula based on SCr of 1.03 mg/dL (H)).   Medical History: Past Medical History:  Diagnosis Date  . Gestational diabetes   . Hypertension     Medications:  Scheduled:  . furosemide  20 mg Intravenous Once  . [START ON 06/27/2017] furosemide  60 mg Intravenous Q12H  . gabapentin  300 mg Oral BID  . [START ON 06/27/2017] insulin aspart  0-20 Units Subcutaneous TID WC  . insulin aspart  0-5 Units Subcutaneous QHS  . [START ON 06/27/2017] losartan  100 mg Oral Daily  . sodium chloride flush  3 mL Intravenous Q12H   Infusions:  . 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 Admitted 06/26/17 with shortness of breath, patient recently admitted with the same.  INR  supratherapeutic on admission. Pharmacy is consulted to continue warfarin dosing.  Goal of Therapy:  INR 2-3  Plan:  No warfarin today Daily PT/INR  Peggyann Juba, PharmD, BCPS Pager: [phone] 06/26/2017,7:52 PM

## 2017-06-26 NOTE — ED Provider Notes (Signed)
[facility] DEPT Provider Note   CSN: [id number] Arrival date & time: 06/26/17  1415     History   Chief Complaint Chief Complaint  Patient presents with  . Shortness of Breath  . Leg Swelling    HPI Alexa [name] is a 42 y.o. female.  HPI   Pt is a 42 y/o female with a h/o HTN, T2DM, CHF, PE (on coumadin) who presents to the ED c/o shortness of breath with exertion for the last 3 days. Sxs have not worsened since onset but have remained persistent.  Also reports increased bilateral lower extremity swelling since for the last 6 days, left worse than right.  Denies any pain or redness to the left lower extremity.  Denies any missed doses of Coumadin.  States she has been taking 40 mg furosemide daily. She also reports associated productive cough with clear/green sputum, chills, and rhinorrhea. She denies chest pain, sore throat, nasal congestion, or ear pain. Denies headaches, body aches, abd pain, NVD, or urinary sxs.   On review of prior notes, patient was recently admitted to the hospital on 06/10/17 for acute respiratory failure with acute diastolic heart failure.  At that time had echo completed on 06/11/2017 which showed hyperdynamic systolic function with an EF of 70-75% as well as dynamic obstruction in the mid cavity gradient of approximately 22 mm Hg.  There was evidence of right ventricular volume overload and dilation of the right ventricle.  There is also moderate to severe tricuspid regurg.  She was given outpt f/u and according to discharge summary it appears patient was advised to double her dose of 40 mg furosemide daily, however on history patient states she has not doubled this dose. States she has gained 10 pounds since she was discharged from the hospital.  Last INR checked on 06/17/17 and was 2.0. Patient does not require oxygen at home.  Past Medical History:  Diagnosis Date  . Gestational diabetes   . Hypertension     Patient  Active Problem List   Diagnosis Date Noted  . OSA (obstructive sleep apnea) 06/26/2017  . Pulmonary hypertension ([place]) 06/26/2017  . Acute CHF (congestive heart failure) ([place]) 06/10/2017  . Personal history of venous thrombosis and embolism 10/06/2016  . Pelvic lymphadenopathy 10/06/2016  . Adenopathy 08/08/2016  . Aortic thrombus ([place]) 06/19/2016  . Chronic anticoagulation 06/19/2016  . Diastolic dysfunction 81/03/7508  . HCD (hypertensive cardiovascular disease) 06/19/2016  . Anticoagulated on Coumadin 06/04/2016  . Other acute pulmonary embolism without acute cor pulmonale ([employer]) 06/04/2016  . Acute pulmonary embolus ([place]) 05/14/2016  . Acute respiratory failure with hypoxia ([place]) 05/14/2016  . Hypoxia   . Class 3 obesity due to excess calories with serious comorbidity and body mass index (BMI) greater than or equal to 70 in adult 04/15/2016  . Athlete's foot 10/08/2015  . 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 Benign essential HTN 06/22/2015  . 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 BMI 70 and over, adult ([place]) 06/22/2015  . Vitamin D deficiency 06/22/2015  . Iron deficiency anemia 06/22/2015    Past Surgical History:  Procedure Laterality Date  . CESAREAN SECTION       OB History   None      Home Medications    Prior to Admission medications   Medication Sig Start Date End Date Taking? Authorizing Provider  acetaminophen (TYLENOL) 500 MG tablet Take 1,000 mg by mouth daily as needed for mild pain.  Yes [provider]  cetirizine (ZYRTEC) 10 MG tablet Take 1 tablet (10 mg total) by mouth daily. 12/11/16  Yes Ivar Drape D, PA  ferrous sulfate 325 (65 FE) MG tablet Take 325 mg by mouth 2 (two) times daily with a meal.    Yes [provider]  furosemide (LASIX) 40 MG tablet Take 1 tablet (40 mg total) by mouth daily. 04/08/17  Yes English, Colletta Maryland D, PA  gabapentin (NEURONTIN) 300  MG capsule Take 1 capsule (300 mg total) by mouth 2 (two) times daily. 04/08/17  Yes Ivar Drape D, PA  losartan (COZAAR) 100 MG tablet take 1 tablet by mouth once daily 01/30/17  Yes English, [name] D, PA  metFORMIN (GLUCOPHAGE) 500 MG tablet take 1 tablet by mouth twice a day with meals 04/30/17  Yes Sagardia, Ines Bloomer, MD  warfarin (COUMADIN) 7.5 MG tablet take 1 and 1/2 tablet to 2 tablets once daily as directed BY COUMADIN CLINIC 06/07/17  Yes Kerin Ransom K, PA-C  glucose blood test strip ascensia  Meter/strips preferred Test daily Use as instructed Dx.dm 06/23/16   Joretta Bachelor, PA  Lancets [facility] ULTRASOFT) lancets Test daily Use as instructed dm2 06/22/16   Ivar Drape D, PA  nystatin (MYCOSTATIN/NYSTOP) powder Apply topically 4 (four) times daily. Patient not taking: Reported on 06/26/2017 03/24/16   Shawnee Knapp, MD    Family History Family History  Problem Relation Age of Onset  . Diabetes Maternal Grandmother   . Cancer Maternal Grandmother        breast cancer   . Hypertension Mother   . Cancer Maternal Aunt 55       breast cancer   . Cancer Maternal Aunt 67       breast cancer     Social History Social History   Tobacco Use  . Smoking status: Former Smoker    Types: Cigarettes  . Smokeless tobacco: Never Used  . Tobacco comment: only smokes when she drinks alcohol - 1 pack per week  Substance Use Topics  . Alcohol use: Yes    Alcohol/week: 2.4 - 3.6 oz    Types: 4 - 6 Standard drinks or equivalent per week    Comment: mixed drinks 3-4 times/weeks  . Drug use: No     Allergies   Penicillins   Review of Systems Review of Systems  Constitutional: Positive for chills and fatigue.  HENT: Positive for rhinorrhea. Negative for congestion, ear pain and sore throat.   Eyes: Negative for visual disturbance.  Respiratory: Positive for cough and shortness of breath.   Cardiovascular: Positive for leg swelling. Negative for chest pain and  palpitations.  Gastrointestinal: Negative for abdominal pain, constipation, diarrhea, nausea and vomiting.  Genitourinary: Negative for dysuria, flank pain and hematuria.  Musculoskeletal: Negative for back pain and myalgias.  Skin: Negative for rash.  Neurological: Negative for dizziness, light-headedness, numbness and headaches.  All other systems reviewed and are negative.    Physical Exam Updated Vital Signs BP 128/71   Pulse 90   Temp 98.9 F (37.2 C)   Resp (!) 25   Ht 5\' 4"  (1.626 m)   Wt (!) 193.2 kg (426 lb)   LMP 05/29/2017 (Approximate)   SpO2 97%   BMI 73.12 kg/m   Physical Exam  Constitutional: She appears well-developed and well-nourished. No distress.  HENT:  Head: Normocephalic and atraumatic.  Mouth/Throat: Oropharynx is clear and moist.  Eyes: Pupils are equal, round, and reactive to light. Conjunctivae are  normal.  Neck: Neck supple.  Do not appreciate JVD, however exam limited due to morbid obesity  Cardiovascular: Normal rate, regular rhythm, normal heart sounds and intact distal pulses.  No murmur heard. Pulmonary/Chest: Effort normal. No respiratory distress. She has no wheezes.  Diffusely decreased breath sounds throughout. No tachypnea. Speaking in full sentences.   Abdominal: Soft. She exhibits no distension. There is no tenderness.  Musculoskeletal: She exhibits no edema.  2+ pitting edema to BLE with mild ttp to bilat calves. No erythema to BLE.   Neurological: She is alert.  Skin: Skin is warm and dry. Capillary refill takes less than 2 seconds.  Psychiatric: She has a normal mood and affect.  Nursing note and vitals reviewed.    ED Treatments / Results  Labs (all labs ordered are listed, but only abnormal results are displayed) Labs Reviewed  CBC WITH DIFFERENTIAL/PLATELET - Abnormal; Notable for the following components:      Result Value   MCHC 29.7 (*)    RDW 18.9 (*)    All other components within normal limits  BASIC METABOLIC  PANEL - Abnormal; Notable for the following components:   CO2 33 (*)    BUN 21 (*)    Creatinine, Ser 1.03 (*)    Calcium 8.7 (*)    All other components within normal limits  BRAIN NATRIURETIC PEPTIDE - Abnormal; Notable for the following components:   B Natriuretic Peptide 136.0 (*)    All other components within normal limits  PROTIME-INR - Abnormal; Notable for the following components:   Prothrombin Time 39.0 (*)    INR 4.04 (*)    All other components within normal limits  I-STAT BETA HCG BLOOD, ED ([facility], [facility], [facility] ONLY)  I-STAT TROPONIN, ED    EKG EKG Interpretation  Date/Time:  Wednesday June 26 2017 15:08:00 EDT Ventricular Rate:  85 PR Interval:    QRS Duration: 83 QT Interval:  380 QTC Calculation: 452 R Axis:   87 Text Interpretation:  Sinus rhythm No significant change since last tracing Confirmed by Lacretia Leigh ([id number]) on 06/26/2017 3:35:41 PM   Radiology Dg Chest 2 View  Result Date: 06/26/2017 CLINICAL DATA:  10 pound weight gain since hospital discharge June 15, 2017. Extremity swelling. EXAM: CHEST - 2 VIEW COMPARISON:  Chest radiograph June 10, 2017 FINDINGS: Similar to worsening cardiomegaly. Pulmonary vascular congestion without pleural effusion or focal consolidation. No pneumothorax. Large body habitus. IMPRESSION: Similar to worsening cardiomegaly with pulmonary vascular congestion. Electronically Signed   By: Elon Alas M.D.   On: 06/26/2017 16:13    Procedures Procedures (including critical care time)  Medications Ordered in ED Medications  furosemide (LASIX) injection 40 mg (40 mg Intravenous Given 06/26/17 1644)     Initial Impression / Assessment and Plan / ED Course  I have reviewed the triage vital signs and the nursing notes.  Pertinent labs & imaging results that were available during my care of the patient were reviewed by me and considered in my medical decision making (see chart for details).  Discussed pt presentation and exam  findings with Dr. Zenia Resides, who personally evaluated the pt and agrees with the plan for admission for CHF exacerbation.   5:11 PM CONSULT with Dr. Maryland Pink from hospitalist service who will admit the pt.   Final Clinical Impressions(s) / ED Diagnoses   Final diagnoses:  Acute on chronic congestive heart failure, unspecified heart failure type [facility])   42 year old female with history of CHF and recent 10 pound  weight gain after being discharged from the hospital after CHF exacerbation. Has had worsening SOB on exertion and for the last 3 days and worsening LE swelling.   tachypneic in the low 20s initially. Requiring 4L [place] to maintain sats to 96%. HR and BP WNL. Afebrile.   CXR with cardiomegaly and pulmonary vascular congestion.  ECG with normal sinus rhythm, heart rate 85.  No significant change since last tracing.  No ischemic changes noted.  BNP elevated to 136, however patient appears to be in acute CHF exacerbation.  Lower suspicion for PE daily given PT INR is supratherapeutic at 4.04.  Doubt infectious etiology given no infiltrates on chest x-ray, no leukocytosis.  BMP with elevated CO2 to 33, improved from previous. also with elevated BUN to 21 and creatinine to 1.03 which may reflect intravascular volume depletion.   Pt with new O2 requirement, and appears to be in acute CHF exacerbation. Gave 40mg  lasix IV and will plan for admission for further tx.    ED Discharge Orders    None       Rodney Booze, [place] 06/26/17 1813

## 2017-06-26 NOTE — Telephone Encounter (Signed)
Pt called reporting a 10 pound weight gain since hospital discharge on 06/15/17; she also reports that her ankle, legs and feet are swollen; she states that she has not checked her BP; recommendations made per protocol to include going to ED; spoke with Rudene Christians, RN at [facility] and she concurs that the pt needs to go to ED; the pt verbalizes understanding and will proceed there; will route to office for notification of this encounter.  Reason for Disposition . [1] Difficulty breathing with exertion (e.g., walking) AND [2] new onset or worsening  Answer Assessment - Initial Assessment Questions 1. ONSET: "When did the swelling start?" (e.g., minutes, hours, days)     days 2. LOCATION: "What part of the leg is swollen?"  "Are both legs swollen or just one leg?"     yes 3. SEVERITY: "How bad is the swelling?" (e.g., localized; mild, moderate, severe)  - Localized - small area of swelling localized to one leg  - MILD pedal edema - swelling limited to foot and ankle, pitting edema < 1/4 inch (6 mm) deep, rest and elevation eliminate most or all swelling  - MODERATE edema - swelling of lower leg to knee, pitting edema > 1/4 inch (6 mm) deep, rest and elevation only partially reduce swelling  - SEVERE edema - swelling extends above knee, facial or hand swelling present      Above knee 4. REDNESS: "Does the swelling look red or infected?"     no 5. PAIN: "Is the swelling painful to touch?" If so, ask: "How painful is it?"   (Scale 1-10; mild, moderate or severe)     Foot pain but she has neuropathy rated 1 out of 10 6. FEVER: "Do you have a fever?" If so, ask: "What is it, how was it measured, and when did it start?"      no 7. CAUSE: "What do you think is causing the leg swelling?"     Told that she had symptoms of heart failure when she was in the hospital  8. MEDICAL HISTORY: "Do you have a history of heart failure, kidney disease, liver failure, or cancer?"     Told that she had symptoms of heart  failure when she was in the hospital 9. RECURRENT SYMPTOM: "Have you had leg swelling before?" If so, ask: "When was the last time?" "What happened that time?"   Before recent Hospital stay  10. OTHER SYMPTOMS: "Do you have any other symptoms?" (e.g., chest pain, difficulty breathing)       Occ shortness of breath  11. PREGNANCY: "Is there any chance you are pregnant?" "When was your last menstrual period?"       No; spotting from 04/2017 to 05/2017  Protocols used: LEG SWELLING AND EDEMA-A-AH

## 2017-06-26 NOTE — ED Notes (Signed)
ED TO INPATIENT HANDOFF REPORT  Name/Age/Gender Alexa [name] 42 y.o. female  Code Status Code Status History    Date Active Date Inactive Code Status Order ID Comments User Context   06/10/2017 1407 06/15/2017 2011 Full Code [id number]  Hosie Poisson, MD Inpatient   05/14/2016 1605 05/22/2016 2221 Full Code [id number]  Orson Eva, MD ED      Home/SNF/Other Home  Chief Complaint SOB   Level of Care/Admitting Diagnosis ED Disposition    ED Disposition Condition [place]: [facility] [[id number]]  Level of Care: Telemetry [5]  Admit to tele based on following criteria: Acute CHF  Diagnosis: Acute CHF (congestive heart failure) [facility]) [[id number]]  Admitting Physician: Bonnielee Haff [[id number]]  Attending Physician: Bonnielee Haff [[id number]]  Estimated length of stay: past midnight tomorrow  Certification:: I certify this patient will need inpatient services for at least 2 midnights  PT Class (Do Not Modify): Inpatient [101]  PT Acc Code (Do Not Modify): Private [1]       Medical History Past Medical History:  Diagnosis Date  . Gestational diabetes   . Hypertension     Allergies Allergies  Allergen Reactions  . Penicillins Hives    Has patient had a PCN reaction causing immediate rash, facial/tongue/throat swelling, SOB or lightheadedness with hypotension: No Has patient had a PCN reaction causing severe rash involving mucus membranes or skin necrosis: No Has patient had a PCN reaction that required hospitalization No Has patient had a PCN reaction occurring within the last 10 years: No If all of the above answers are "NO", then may proceed with Cephalosporin use.    IV Location/Drains/Wounds Patient Lines/Drains/Airways Status   Active Line/Drains/Airways    Name:   Placement date:   Placement time:   Site:   Days:   Peripheral IV 06/26/17 Right Antecubital   06/26/17    1524    Antecubital   less than 1           Labs/Imaging Results for orders placed or performed during the hospital encounter of 06/26/17 (from the past 48 hour(s))  CBC with Differential     Status: Abnormal   Collection Time: 06/26/17  3:20 PM  Result Value Ref Range   WBC 8.0 4.0 - 10.5 K/uL   RBC 5.10 3.87 - 5.11 MIL/uL   Hemoglobin 13.3 12.0 - 15.0 g/dL   HCT 44.8 36.0 - 46.0 %   MCV 87.8 78.0 - 100.0 fL   MCH 26.1 26.0 - 34.0 pg   MCHC 29.7 (L) 30.0 - 36.0 g/dL   RDW 18.9 (H) 11.5 - 15.5 %   Platelets 393 150 - 400 K/uL   Neutrophils Relative % 66 %   Neutro Abs 5.3 1.7 - 7.7 K/uL   Lymphocytes Relative 21 %   Lymphs Abs 1.7 0.7 - 4.0 K/uL   Monocytes Relative 13 %   Monocytes Absolute 1.0 0.1 - 1.0 K/uL   Eosinophils Relative 0 %   Eosinophils Absolute 0.0 0.0 - 0.7 K/uL   Basophils Relative 0 %   Basophils Absolute 0.0 0.0 - 0.1 K/uL    Comment: Performed at [facility], [place] [address], [place], [place] [address]  Basic metabolic panel     Status: Abnormal   Collection Time: 06/26/17  3:20 PM  Result Value Ref Range   Sodium 140 135 - 145 mmol/L   Potassium 4.3 3.5 - 5.1 mmol/L   Chloride 101 101 - 111  mmol/L   CO2 33 (H) 22 - 32 mmol/L   Glucose, Bld 83 65 - 99 mg/dL   BUN 21 (H) 6 - 20 mg/dL   Creatinine, Ser 1.03 (H) 0.44 - 1.00 mg/dL   Calcium 8.7 (L) 8.9 - 10.3 mg/dL   GFR calc non Af Amer >60 >60 mL/min   GFR calc Af Amer >60 >60 mL/min    Comment: (NOTE) The eGFR has been calculated using the CKD EPI equation. This calculation has not been validated in all clinical situations. eGFR's persistently <60 mL/min signify possible Chronic Kidney Disease.    Anion gap 6 5 - 15    Comment: Performed at [facility], [place] [address] [place], [place] [address]  Brain natriuretic peptide     Status: Abnormal   Collection Time: 06/26/17  3:20 PM  Result Value Ref Range   B Natriuretic Peptide 136.0 (H) 0.0 - 100.0 pg/mL    Comment: Performed at [facility], [place] [address] [place], [place] [address]  Protime-INR     Status: Abnormal   Collection Time: 06/26/17  3:20 PM  Result Value Ref Range   Prothrombin Time 39.0 (H) 11.4 - 15.2 seconds   INR 4.04 (HH)     Comment: CRITICAL RESULT CALLED TO, READ BACK BY AND VERIFIED WITH: INMAN,J. RN _0  ON 04.03.19 BY COHEN,K Performed at [facility], [place] [address] [place], [place] [address]   I-Stat Troponin, ED (not at [facility])     Status: None   Collection Time: 06/26/17  3:29 PM  Result Value Ref Range   Troponin i, poc 0.00 0.00 - 0.08 ng/mL   Comment 3            Comment: Due to the release kinetics of cTnI, a negative result within the first hours of the onset of symptoms does not rule out myocardial infarction with certainty. If myocardial infarction is still suspected, repeat the test at appropriate intervals.   I-Stat Beta hCG blood, ED ([facility], [facility], [facility] only)     Status: None   Collection Time: 06/26/17  3:43 PM  Result Value Ref Range   I-stat hCG, quantitative <5.0 <5 mIU/mL   Comment 3            Comment:   GEST. AGE      CONC.  (mIU/mL)   <=1 WEEK        5 - 50     2 WEEKS       50 - 500     3 WEEKS       100 - 10,000     4 WEEKS     1,000 - 30,000        FEMALE AND NON-PREGNANT FEMALE:     LESS THAN 5 mIU/mL    Dg Chest 2 View  Result Date: 06/26/2017 CLINICAL DATA:  10 pound weight gain since hospital discharge June 15, 2017. Extremity swelling. EXAM: CHEST - 2 VIEW COMPARISON:  Chest radiograph June 10, 2017 FINDINGS: Similar to worsening cardiomegaly. Pulmonary vascular congestion without pleural effusion or focal consolidation. No pneumothorax. Large body habitus. IMPRESSION: Similar to worsening cardiomegaly with pulmonary vascular congestion. Electronically Signed   By: Elon Alas M.D.   On: 06/26/2017 16:13    Pending Labs [employer] (From admission, onward)   Start     Ordered   Signed and Held  [employer],    R     Signed and  Held   Signed and Held  Basic metabolic panel  Daily,   R     Signed and Held   Signed and Held  CBC WITH DIFFERENTIAL  Tomorrow morning,   R     Signed and Held      Vitals/Pain Today's Vitals   06/26/17 1530 06/26/17 1615 06/26/17 1700 06/26/17 1745  BP: 132/80 137/73 136/64 128/71  Pulse: 88 82 93 90  Resp: 13 (!) 9 10 (!) 25  Temp:      TempSrc:      SpO2: 96% 94% 98% 97%  Weight:      Height:      PainSc:        Isolation Precautions Droplet precaution  Medications Medications  furosemide (LASIX) injection 40 mg (40 mg Intravenous Given 06/26/17 1644)    Mobility walks

## 2017-06-26 NOTE — Clinical Documentation Improvement (Signed)
Pt gives permission for questions to be asked on nursing admission history in front of her mother. Lucius Conn BSN, RN-BC Admissions RN 06/26/2017 6:09 PM

## 2017-06-26 NOTE — H&P (Addendum)
[facility] History and Physical  Alexa [name] VOZ:[id number] DOB: 1975/05/29 DOA: 06/26/2017   PCP: Joretta Bachelor, PA  Specialists: Seen by pulmonology but not recently.  Also seen by Dr. Burr Medico with hematology.  Chief Complaint: Shortness of breath for the last 4 days  HPI: Alexa [name] is a 42 y.o. female with a past medical history of morbid obesity, pulmonary embolism on chronic anticoagulation, obstructive sleep apnea not on treatment, recent hospitalization for acute congestive heart failure thought to be predominantly right-sided, pulmonary hypertension, diabetes mellitus type 2 who was discharged on March 23 after she spent a few days in the hospital being diuresed.  She mentions that she lost about 20 pounds during that stay.  She felt better at discharge.  She admits that she drinks large quantities of fluid/water at home.  Over the last 4 days she has noticed worsening shortness of breath.  She has gained about 9 pounds just in the last 3 days.  She noticed worsening lower extremity swelling.  Denies any chest pain.  No syncopal episodes.  Apparently she was instructed to double up on the dose of her furosemide but she has been taking it only once a day.  Denies any fever or chills.  No nausea vomiting.  She had symptoms suggestive of orthopnea.  In the emergency department patient was found to be hypoxic per ED provider.  She was requiring 4 L to maintain sats in the mid 90s.  She was found to have supratherapeutic INR.  She was given 40 mg of Lasix with brisk diuresis but only minimal improvement in symptoms.  She will need hospitalization for further management.  Home Medications: Prior to Admission medications   Medication Sig Start Date End Date Taking? Authorizing Provider  acetaminophen (TYLENOL) 500 MG tablet Take 1,000 mg by mouth daily as needed for mild pain.   Yes [provider]  cetirizine (ZYRTEC) 10 MG tablet Take 1 tablet (10 mg total) by  mouth daily. 12/11/16  Yes Ivar Drape D, PA  ferrous sulfate 325 (65 FE) MG tablet Take 325 mg by mouth 2 (two) times daily with a meal.    Yes [provider]  furosemide (LASIX) 40 MG tablet Take 1 tablet (40 mg total) by mouth daily. 04/08/17  Yes English, Colletta Maryland D, PA  gabapentin (NEURONTIN) 300 MG capsule Take 1 capsule (300 mg total) by mouth 2 (two) times daily. 04/08/17  Yes Ivar Drape D, PA  losartan (COZAAR) 100 MG tablet take 1 tablet by mouth once daily 01/30/17  Yes English, [name] D, PA  metFORMIN (GLUCOPHAGE) 500 MG tablet take 1 tablet by mouth twice a day with meals 04/30/17  Yes Sagardia, Ines Bloomer, MD  warfarin (COUMADIN) 7.5 MG tablet take 1 and 1/2 tablet to 2 tablets once daily as directed BY [facility] 06/07/17  Yes Kerin Ransom K, PA-C  glucose blood test strip ascensia  Meter/strips preferred Test daily Use as instructed Dx.dm 06/23/16   Joretta Bachelor, PA  Lancets [facility] ULTRASOFT) lancets Test daily Use as instructed dm2 06/22/16   Ivar Drape D, PA  nystatin (MYCOSTATIN/NYSTOP) powder Apply topically 4 (four) times daily. Patient not taking: Reported on 06/26/2017 03/24/16   Shawnee Knapp, MD    Allergies:  Allergies  Allergen Reactions  . Penicillins Hives    Has patient had a PCN reaction causing immediate rash, facial/tongue/throat swelling, SOB or lightheadedness with hypotension: No Has patient had a PCN reaction causing severe rash involving  mucus membranes or skin necrosis: No Has patient had a PCN reaction that required hospitalization No Has patient had a PCN reaction occurring within the last 10 years: No If all of the above answers are "NO", then may proceed with Cephalosporin use.    Past Medical History: Past Medical History:  Diagnosis Date  . Gestational diabetes   . Hypertension     Past Surgical History:  Procedure Laterality Date  . CESAREAN SECTION      Social History: Lives in [place].   Denies smoking on a daily basis but does do it occasionally.  Denies drinking alcohol on a daily basis.  Family History:  Family History  Problem Relation Age of Onset  . Diabetes Maternal Grandmother   . Cancer Maternal Grandmother        breast cancer   . Hypertension Mother   . Cancer Maternal Aunt 55       breast cancer   . Cancer Maternal Aunt 55       breast cancer      Review of Systems - History obtained from the patient General ROS: positive for  - fatigue Psychological ROS: negative Ophthalmic ROS: negative ENT ROS: negative Allergy and Immunology ROS: negative Hematological and Lymphatic ROS: negative Endocrine ROS: negative Respiratory ROS: as in hpi Cardiovascular ROS: as in hpi Gastrointestinal ROS: no abdominal pain, change in bowel habits, or black or bloody stools Genito-Urinary ROS: no dysuria, trouble voiding, or hematuria Musculoskeletal ROS: negative Neurological ROS: no TIA or stroke symptoms Dermatological ROS: negative  Physical Examination  Vitals:   06/26/17 1530 06/26/17 1615 06/26/17 1700 06/26/17 1745  BP: 132/80 137/73 136/64 128/71  Pulse: 88 82 93 90  Resp: 13 (!) 9 10 (!) 25  Temp:      TempSrc:      SpO2: 96% 94% 98% 97%  Weight:      Height:        BP 128/71   Pulse 90   Temp 98.9 F (37.2 C)   Resp (!) 25   Ht 5\' 4"  (1.626 m)   Wt (!) 193.2 kg (426 lb)   LMP 05/29/2017 (Approximate)   SpO2 97%   BMI 73.12 kg/m   General appearance: alert, cooperative, appears stated age, no distress and morbidly obese Head: Normocephalic, without obvious abnormality, atraumatic Eyes: conjunctivae/corneas clear. PERRL, EOM's intact. Fundi benign. Throat: lips, mucosa, and tongue normal; teeth and gums normal Neck: no adenopathy, no carotid bruit, no JVD, supple, symmetrical, trachea midline and thyroid not enlarged, symmetric, no tenderness/mass/nodules Back: symmetric, no curvature. ROM normal. No CVA tenderness. Resp: Crackles heard  bilaterally at lung bases.  No wheezing.  No rhonchi.  Mildly tachypneic at rest. Cardio: regular rate and rhythm, S1, S2 normal, no murmur, click, rub or gallop GI: soft, non-tender; bowel sounds normal; no masses,  no organomegaly and Morbidly obese Extremities: edema 1-2+ pitting edema bilateral lower extremities.  Chronic skin changes noted. Pulses: 2+ and symmetric Skin: hyperpigmentation - lower leg(s) bilateral Lymph nodes: Cervical, supraclavicular, and axillary nodes normal. Neurologic: Alert and oriented x3.  No obvious focal neurological deficits.    Labs on Admission: I have personally reviewed following labs and imaging studies  CBC: Recent Labs  Lab 06/26/17 1520  WBC 8.0  NEUTROABS 5.3  HGB 13.3  HCT 44.8  MCV 87.8  PLT 009   Basic Metabolic Panel: Recent Labs  Lab 06/26/17 1520  NA 140  K 4.3  CL 101  CO2 33*  GLUCOSE  83  BUN 21*  CREATININE 1.03*  CALCIUM 8.7*   GFR: Estimated Creatinine Clearance: 127.5 mL/min (A) (by C-G formula based on SCr of 1.03 mg/dL (H)).  Coagulation Profile: Recent Labs  Lab 06/26/17 1520  INR 4.04*    Radiological Exams on Admission: Dg Chest 2 View  Result Date: 06/26/2017 CLINICAL DATA:  10 pound weight gain since hospital discharge June 15, 2017. Extremity swelling. EXAM: CHEST - 2 VIEW COMPARISON:  Chest radiograph June 10, 2017 FINDINGS: Similar to worsening cardiomegaly. Pulmonary vascular congestion without pleural effusion or focal consolidation. No pneumothorax. Large body habitus. IMPRESSION: Similar to worsening cardiomegaly with pulmonary vascular congestion. Electronically Signed   By: Elon Alas M.D.   On: 06/26/2017 16:13    My interpretation of Electrocardiogram: Sinus rhythm at 85 bpm.  Normal axis.  Intervals appear to be normal.  No Q waves.  No concerning ST or T wave changes.   Problem List  Principal Problem:   Acute respiratory failure with hypoxia (HCC) Active Problems:   BMI 70  and over, adult ([place])   Anticoagulated on Coumadin   Acute CHF (congestive heart failure) (HCC)   OSA (obstructive sleep apnea)   Pulmonary hypertension (HCC)   Assessment: This is a 42 year old African-American female with past medical history as stated earlier who presents with 3-4-day history of worsening shortness of breath and weight gain.  This is all secondary to fluid overload from most likely her right-sided CHF.  Systolic function was noted to be about 70% based on echocardiogram done in March.  However she was found to have severe TR and significantly elevated pulmonary artery pressures.  Etiology for this appears to be multifactorial including history of PE, sleep apnea and morbid obesity.  Plan: #1 Acute CHF likely diastolic versus right-sided/Acute hypoxic respiratory failure: She will be admitted.  She will be given intravenous diuretics though cautiously due to component of right heart failure.  Her weight is recorded as 193.2 kg.  She was 190 kg on discharge on March 23.  Strict ins and outs.  Daily weights.  She was supposed to see cardiology as an outpatient but has not had a chance to make an appointment yet.  We will consult cardiology here in the hospital since this is her second hospitalization for same.  Continue with the losartan for now.  Will need education regarding fluid management.  She consumes a lot of water at home.  #2  History of unprovoked pulmonary embolism, on chronic anticoagulation/Pulmonary hypertension: Continue with warfarin.  INR is supratherapeutic.  We will request pharmacy to follow this.  Patient may benefit from pulmonology consultation as well.  Discussed with Dr. Halford Chessman who will evaluate her tomorrow.  Patient also followed by hematology.  #3  Obstructive sleep apnea: This was diagnosed in September 2018.  Her AHI was greater than 80 with multiple episodes of oxygen desaturations.  BiPAP was ordered however it appears based on notes that she was lost to  follow-up.  Will appreciate pulmonology assistance with this as well.  #4  Diabetes mellitus type 2 in obese patient: SSI.  Hold metformin for now.  HbA1c was 5.7 in March.  #5 Morbid obesity: BMI greater than 70.  #6 Abdominal adenopathy: This was incidentally detected on a CT scan from last year.  Hematology had plan to do a repeat scan in March but it does not look like patient has had one.  This can be pursued as an outpatient.  DVT Prophylaxis: She is on  warfarin Code Status: Full code Family Communication: Discussed with the patient and her mother Consults called: Pulmonology.  Cardiology.  Severity of Illness: The appropriate patient status for this patient is INPATIENT. Inpatient status is judged to be reasonable and necessary in order to provide the required intensity of service to ensure the patient's safety. The patient's presenting symptoms, physical exam findings, and initial radiographic and laboratory data in the context of their chronic comorbidities is felt to place them at high risk for further clinical deterioration. Furthermore, it is not anticipated that the patient will be medically stable for discharge from the hospital within 2 midnights of admission. The following factors support the patient status of inpatient.   " The patient's presenting symptoms include shortness of breath, weight gain. " The worrisome physical exam findings include lower extremity edema. " The initial radiographic and laboratory data are worrisome because of cardiomegaly. " The chronic co-morbidities include morbid obesity, diabetes mellitus type 2.   * I certify that at the point of admission it is my clinical judgment that the patient will require inpatient hospital care spanning beyond 2 midnights from the point of admission due to high intensity of service, high risk for further deterioration and high frequency of surveillance required.*  Further management decisions will depend on results of  further testing and patient's response to treatment.   Bonnielee Haff  [facility] Pager [phone]  If 7PM-7AM, please contact night-coverage [email or web address] Password [email or web address]  06/26/2017, 5:59 PM

## 2017-06-26 NOTE — ED Provider Notes (Signed)
Medical screening examination/treatment/procedure(s) were conducted as a shared visit with non-physician practitioner(s) and myself.  I personally evaluated the patient during the encounter.  EKG Interpretation  Date/Time:  Wednesday June 26 2017 15:08:00 EDT Ventricular Rate:  85 PR Interval:    QRS Duration: 83 QT Interval:  380 QTC Calculation: 452 R Axis:   87 Text Interpretation:  Sinus rhythm No significant change since last tracing Confirmed by Lacretia Leigh ([id number]) on 06/26/2017 3:5:50 PM 42 year old female presents with 2 days of dyspnea on exertion.  Does have a history of CHF as well as PE.  She endorses orthopnea.  X-ray consistent with heart failure.  Was given Lasix here and admitted to the hospitalist service.   Lacretia Leigh, MD 06/26/17 [phone]

## 2017-06-27 ENCOUNTER — Encounter (HOSPITAL_COMMUNITY): Payer: Self-pay | Admitting: Cardiology

## 2017-06-27 DIAGNOSIS — J9621 Acute and chronic respiratory failure with hypoxia: Secondary | ICD-10-CM

## 2017-06-27 DIAGNOSIS — I2781 Cor pulmonale (chronic): Secondary | ICD-10-CM

## 2017-06-27 DIAGNOSIS — I2721 Secondary pulmonary arterial hypertension: Secondary | ICD-10-CM

## 2017-06-27 DIAGNOSIS — J9622 Acute and chronic respiratory failure with hypercapnia: Secondary | ICD-10-CM

## 2017-06-27 DIAGNOSIS — E669 Obesity, unspecified: Secondary | ICD-10-CM

## 2017-06-27 LAB — BASIC METABOLIC PANEL
Anion gap: 8 (ref 5–15)
BUN: 15 mg/dL (ref 6–20)
CHLORIDE: 99 mmol/L — AB (ref 101–111)
CO2: 34 mmol/L — ABNORMAL HIGH (ref 22–32)
CREATININE: 0.84 mg/dL (ref 0.44–1.00)
Calcium: 8.6 mg/dL — ABNORMAL LOW (ref 8.9–10.3)
GFR calc Af Amer: >60 mL/min (ref >60)
GFR calc non Af Amer: >60 mL/min (ref >60)
GLUCOSE: 101 mg/dL — AB (ref 65–99)
Potassium: 4.4 mmol/L (ref 3.5–5.1)
SODIUM: 141 mmol/L (ref 135–145)

## 2017-06-27 LAB — CBC WITH DIFFERENTIAL/PLATELET
BASOS PCT: 0 %
Basophils Absolute: 0 10*3/uL (ref 0.0–0.1)
EOS ABS: 0 10*3/uL (ref 0.0–0.7)
EOS PCT: 0 %
HCT: 47.5 % — ABNORMAL HIGH (ref 36.0–46.0)
Hemoglobin: 13.5 g/dL (ref 12.0–15.0)
LYMPHS ABS: 1.4 10*3/uL (ref 0.7–4.0)
Lymphocytes Relative: 20 %
MCH: 25.9 pg — AB (ref 26.0–34.0)
MCHC: 28.4 g/dL — AB (ref 30.0–36.0)
MCV: 91.2 fL (ref 78.0–100.0)
MONO ABS: 0.9 10*3/uL (ref 0.1–1.0)
MONOS PCT: 12 %
NEUTROS PCT: 68 %
Neutro Abs: 5 10*3/uL (ref 1.7–7.7)
PLATELETS: 404 10*3/uL — AB (ref 150–400)
RBC: 5.21 MIL/uL — ABNORMAL HIGH (ref 3.87–5.11)
RDW: 19.5 % — AB (ref 11.5–15.5)
WBC: 7.3 10*3/uL (ref 4.0–10.5)

## 2017-06-27 LAB — GLUCOSE, CAPILLARY
GLUCOSE-CAPILLARY: 83 mg/dL (ref 65–99)
GLUCOSE-CAPILLARY: 93 mg/dL (ref 65–99)
Glucose-Capillary: 101 mg/dL — ABNORMAL HIGH (ref 65–99)
Glucose-Capillary: 133 mg/dL — ABNORMAL HIGH (ref 65–99)

## 2017-06-27 LAB — PROTIME-INR
INR: 3.15
Prothrombin Time: 32.1 seconds — ABNORMAL HIGH (ref 11.4–15.2)

## 2017-06-27 MED ORDER — INSULIN ASPART 100 UNIT/ML ~~LOC~~ SOLN: 0.0000 [IU] | Freq: Three times a day (TID) | SUBCUTANEOUS | Status: DC

## 2017-06-27 MED ORDER — WARFARIN - PHARMACIST DOSING INPATIENT: Freq: Every day | Status: DC

## 2017-06-27 MED ORDER — WARFARIN SODIUM 5 MG PO TABS
10.0000 mg | ORAL_TABLET | Freq: Once | ORAL | Status: AC
  Administered 2017-06-27: 10 mg via ORAL
  Filled 2017-06-27: qty 2

## 2017-06-27 MED ORDER — INSULIN ASPART 100 UNIT/ML ~~LOC~~ SOLN: 0.0000 [IU] | Freq: Every day | SUBCUTANEOUS | Status: DC

## 2017-06-27 NOTE — Progress Notes (Signed)
[facility] PROGRESS NOTE  Alexa [name] MHD:[id number] DOB: 1975-05-14 DOA: 06/26/2017  PCP: Joretta Bachelor, PA  Brief History/Interval Summary: 42 year old African-American female with a past medical history of morbid obesity, pulmonary embolism on chronic anticoagulation, obstructive sleep apnea not on treatment, recent hospitalization for acute congestive heart failure thought to be predominantly right-sided, pulmonary hypertension, diabetes mellitus type 2 who was discharged on March 23 after she spent a few days in the hospital being diuresed.  She presented to the hospital with complains of worsening shortness of breath, weight gain.  She was noted to be hypoxic.  She was noted to have volume overload.  She was hospitalized for further management.  Reason for Visit: Acute right-sided versus diastolic congestive heart failure  Consultants: Cardiology.  Pulmonology  Procedures: None  Antibiotics: None  Subjective/Interval History: Patient states that she is feeling slightly better this morning.  Not getting as short of breath as she was yesterday.  She has been urinating a lot and tells me that she has lost about 7 pounds.  Although this remains unclear.  Patient denies any chest pain.  ROS: No nausea or vomiting  Objective:  Vital Signs  Vitals:   06/26/17 1844 06/26/17 1939 06/27/17 0552 06/27/17 0600  BP: 138/83 135/68 (!) 142/71   Pulse: 85 87 85   Resp: (!) 25 18 20    Temp:  99.4 F (37.4 C) 99 F (37.2 C)   TempSrc:  Oral Oral   SpO2: 94% 97% 96%   Weight:  (!) 196.5 kg (433 lb 3.2 oz)  (!) 194 kg (427 lb 11.2 oz)  Height:  5\' 5"  (1.651 m)      Intake/Output Summary (Last 24 hours) at 06/27/2017 1255 Last data filed at 06/27/2017 0931 Gross per 24 hour  Intake 820 ml  Output 2450 ml  Net -1630 ml   Filed Weights   06/26/17 1445 06/26/17 1939 06/27/17 0600  Weight: (!) 193.2 kg (426 lb) (!) 196.5 kg (433 lb 3.2 oz) (!) 194 kg (427 lb 11.2 oz)      General appearance: alert, cooperative, appears stated age and no distress Head: Normocephalic, without obvious abnormality, atraumatic Resp: Diminished air entry at the bases.  No wheezing.  No definite crackles.  Mildly tachypneic at rest. Cardio: regular rate and rhythm, S1, S2 normal, no murmur, click, rub or gallop GI: soft, non-tender; bowel sounds normal; no masses,  no organomegaly Extremities: Continues to have 1-2+ edema bilateral lower extremities. Pulses: Good radial pulses bilaterally.  Difficult to palpate pedal pulses due to edema Neurologic: No focal neurological deficit  Lab Results:  Data Reviewed: I have personally reviewed following labs and imaging studies  CBC: Recent Labs  Lab 06/26/17 1520 06/27/17 0519  WBC 8.0 7.3  NEUTROABS 5.3 5.0  HGB 13.3 13.5  HCT 44.8 47.5*  MCV 87.8 91.2  PLT 393 404*    Basic Metabolic Panel: Recent Labs  Lab 06/26/17 1520 06/27/17 0519  NA 140 141  K 4.3 4.4  CL 101 99*  CO2 33* 34*  GLUCOSE 83 101*  BUN 21* 15  CREATININE 1.03* 0.84  CALCIUM 8.7* 8.6*    GFR: Estimated Creatinine Clearance: 158.7 mL/min (by C-G formula based on SCr of 0.84 mg/dL).  Coagulation Profile: Recent Labs  Lab 06/26/17 1520 06/27/17 0519  INR 4.04* 3.15    CBG: Recent Labs  Lab 06/26/17 2118 06/27/17 0757 06/27/17 1226  GLUCAP 107* 83 93      Radiology Studies: Dg  Chest 2 View  Result Date: 06/26/2017 CLINICAL DATA:  10 pound weight gain since hospital discharge June 15, 2017. Extremity swelling. EXAM: CHEST - 2 VIEW COMPARISON:  Chest radiograph June 10, 2017 FINDINGS: Similar to worsening cardiomegaly. Pulmonary vascular congestion without pleural effusion or focal consolidation. No pneumothorax. Large body habitus. IMPRESSION: Similar to worsening cardiomegaly with pulmonary vascular congestion. Electronically Signed   By: Elon Alas M.D.   On: 06/26/2017 16:13     Medications:  Scheduled: .  furosemide  60 mg Intravenous Q12H  . gabapentin  300 mg Oral BID  . insulin aspart  0-20 Units Subcutaneous TID WC  . insulin aspart  0-5 Units Subcutaneous QHS  . losartan  100 mg Oral Daily  . sodium chloride flush  3 mL Intravenous Q12H  . warfarin  10 mg Oral ONCE-1800  . Warfarin - Pharmacist Dosing Inpatient   Does not apply q1800   Continuous: . sodium chloride     QQP:YPPJKD chloride, acetaminophen, ondansetron (ZOFRAN) IV, sodium chloride flush  Assessment/Plan:  Principal Problem:   Acute respiratory failure with hypoxia (HCC) Active Problems:   BMI 70 and over, adult (HCC)   Anticoagulated on Coumadin   Acute CHF (congestive heart failure) (HCC)   OSA (obstructive sleep apnea)   Pulmonary hypertension (HCC)    Acute CHF likely diastolic versus right-sided/Acute hypoxic respiratory failure Patient feels slightly better this morning.  With ambulation saturations did drop to 87% even with 2 L of oxygen by nasal cannula.  Continue IV diuretics.  Await cardiology input.  Continue losartan.  She consumes a lot of fluids/water at home.  She needs to be educated regarding same.  Continue strict ins and outs and daily weights.  History of unprovoked pulmonary embolism, on chronic anticoagulation/Pulmonary hypertension  INR is better today.  Pharmacy has been consulted to dose warfarin.  Pulmonology has been consulted as well.  Patient also followed by hematology as an outpatient.    Obstructive sleep apnea This was diagnosed in September 2018.  Her AHI was greater than 80 with multiple episodes of oxygen desaturations.  BiPAP was ordered however it appears based on notes that she was lost to follow-up.  Also appears to have been some financial issues with the getting the equipment.  Case manager consulted.  Will appreciate any assistance that pulmonology can provide.    Diabetes mellitus type 2 in obese patient SSI.  Hold metformin for now.  HbA1c was 5.7 in March.  Morbid  obesity BMI greater than 70.  Abdominal adenopathy This was incidentally detected on a CT scan from last year.  Hematology had plan to do a repeat scan in March but it does not look like patient has had one.  This can be pursued as an outpatient.  DVT Prophylaxis: On warfarin    Code Status: Full code Family Communication: Discussed with the patient Disposition Plan: Management as outlined above.  Await pulmonology and cardiology input.  Continue IV diuretics.    LOS: 1 day   [facility] Pager [phone] 06/27/2017, 12:55 PM  If 7PM-7AM, please contact night-coverage at [email or web address], password [facility]

## 2017-06-27 NOTE — Evaluation (Signed)
Occupational Therapy Evaluation Patient Details Name: Alexa [name] MRN: [id number] DOB: 05/29/1975 Today's Date: 06/27/2017    History of Present Illness Alexa [name] is a 43 y.o. 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 Pt overall I with simple ADL activity.  Pts mother will A as needed   Follow Up Recommendations  No OT follow up    Equipment Recommendations  None recommended by OT       Precautions / Restrictions Precautions Precaution Comments: monitor sats      Mobility Bed Mobility Overal bed mobility: Modified Independent                Transfers Overall transfer level: Modified independent Equipment used: None             General transfer comment: slightly incr time, no AD, uses cane once standing    Balance     Sitting balance-Leahy Scale: Good     Standing balance support: Single extremity supported;During functional activity   Standing balance comment: NT beyond fair, pt amb with wide BOS Alexa [name]/t body habitus, denies falls                           ADL either performed or assessed with clinical judgement   ADL Overall ADL's : At baseline                                       General ADL Comments: Pt feeling more fatigued than normal but is overall I with ADL activity. Pt is a Print production planner.  Educated pt in regards to energy conservation with ADL activity.  Pts mother will A pt as needed     Vision Patient Visual Report: No change from baseline              Pertinent Vitals/Pain Pain Assessment: No/denies pain     Hand Dominance     Extremity/Trunk Assessment Upper Extremity Assessment Upper Extremity Assessment: Overall WFL for tasks assessed   Lower Extremity Assessment Lower  Extremity Assessment: Overall WFL for tasks assessed       Communication Communication Communication: No difficulties   Cognition Arousal/Alertness: Awake/alert Behavior During Therapy: WFL for tasks assessed/performed Overall Cognitive Status: Within Functional Limits for tasks assessed                                                Home Living Family/patient expects to be discharged to:: Private residence Living Arrangements: Parent(mom)   Type of Home: House Home Access: Level entry     Home Layout: One level               Home Equipment: [name] - single point   Additional Comments: 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      Prior Functioning/Environment Level of Independence: Independent;Independent with assistive device(s)        Comments: amb with cane, sleeps in bed most of the time, breathes better when sleeps more upright, does not have a CPAP  OT Goals(Current goals can be found in the care plan section) Acute Rehab OT Goals Patient Stated Goal: home soon, be off fluid restrictions   OT Frequency:      AM-PAC PT "6 Clicks" Daily Activity     Outcome Measure Help from another person eating meals?: None Help from another person taking care of personal grooming?: None Help from another person toileting, which includes using toliet, bedpan, or urinal?: None Help from another person bathing (including washing, rinsing, drying)?: None Help from another person to put on and taking off regular upper body clothing?: None   6 Click Score: 20   End of Session Nurse Communication: Mobility status  Activity Tolerance: Patient tolerated treatment well Patient left: in bed                   Time: 6378-5885 OT Time Calculation (min): 12 min Charges:  OT General Charges $OT Visit: 1 Visit OT Evaluation $OT Eval Moderate Complexity: 1 Mod G-Codes:     Alexa [name], Alexa [name]  Alexa [name]  Alexa [name] 06/27/2017, 1:42 PM

## 2017-06-27 NOTE — Consult Note (Signed)
Name: Alexa [name] MRN: [id number] DOB: May 24, 1975    ADMISSION DATE:  06/26/2017 CONSULTATION DATE:  4/4  REFERRING MD :  [place] Pink  CHIEF COMPLAINT:  Acute dyspnea w/ h/o recurrent PE  BRIEF PATIENT DESCRIPTION:  42 year old female with significant history of obesity, diastolic heart dysfunction, pulmonary hypertension, cor pulmonale.  Admitted with decompensated diastolic heart failure, cor pulmonale, volume overload.  SIGNIFICANT EVENTS    STUDIES:  ECHO 3/19: chronic cor pulmonale, EF 28-31% LV diastolic fxn nml, RV dilated. 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 During that admit she was aggressively diuresed w/ about 20 lb weight loss and improvement in symptoms.  Admitted 4/3 w/ cc: worsening shortness of breath, increased extremity swelling and  Lb weight gain. In ER: - she was found to be hypoxic requiring titration up to 4 liters to achieve pulse ox of 90% -review of medications on admit found she was taking 1/2 the dosing of her diuretics as instructed -had 9lb weight gain over 3 days -also admitted to drinking lots of fluid  -INR was subtherapeutic  -CXR w/ CM and pulmonary vascular congestion/edema She was admitted w/ working dx of decompensated cor pulmonale, diastolic HF, and pulmonary edema. She was started on IV diuresis, oxygen titrated and cardiology consulted. PCCM also asked to see as she has returned so quickly to the hospital w/ same recurrent issue.   ->of note her ambulatory pulse ox was noted to be > 95% BUT did note brief decrease  to 86% but was said to be asymptomatic and this quickly increased  PAST MEDICAL HISTORY :   has a past medical history of Gestational diabetes and Hypertension.  has a past surgical history that includes Cesarean section. Prior to Admission medications   Medication Sig Start Date End Date Taking? Authorizing Provider  acetaminophen (TYLENOL) 500 MG tablet Take 1,000 mg by mouth daily as needed for mild pain.   Yes [provider]  cetirizine (ZYRTEC) 10 MG tablet Take 1 tablet (10 mg total) by mouth daily. 12/11/16  Yes Ivar Drape D, PA  ferrous sulfate 325 (65 FE) MG tablet Take 325 mg by mouth 2 (two) times daily with a meal.    Yes [provider]  furosemide (LASIX) 40 MG tablet Take 1 tablet (40 mg total) by mouth daily. 04/08/17  Yes English, Colletta Maryland D, PA  gabapentin (NEURONTIN) 300 MG capsule Take 1 capsule (300 mg total) by mouth 2 (two) times daily. 04/08/17  Yes Ivar Drape D, PA  losartan (COZAAR) 100 MG tablet take 1 tablet by mouth once daily 01/30/17  Yes English, [name] D, PA  metFORMIN (GLUCOPHAGE) 500 MG tablet take 1 tablet by mouth twice a day with meals 04/30/17  Yes Sagardia, Ines Bloomer, MD  warfarin (COUMADIN) 7.5 MG tablet take 1 and 1/2 tablet to 2 tablets once daily as directed BY COUMADIN CLINIC 06/07/17  Yes Kerin Ransom K, PA-C  glucose blood test strip ascensia  Meter/strips preferred Test daily Use as instructed Dx.dm 06/23/16   Joretta Bachelor, PA  Lancets (ONETOUCH ULTRASOFT) lancets Test daily Use as instructed dm2 06/22/16   Ivar Drape D, PA  nystatin (MYCOSTATIN/NYSTOP) powder Apply topically 4 (four) times daily. Patient not taking: Reported on 06/26/2017 03/24/16   Shawnee Knapp, MD   Allergies  Allergen Reactions  . Penicillins Hives    Has patient had a PCN reaction causing immediate rash, facial/tongue/throat swelling, SOB or lightheadedness with hypotension: No Has patient had a PCN reaction causing severe rash  involving mucus membranes or skin necrosis: No Has patient had a PCN reaction that required hospitalization No Has patient had a PCN reaction occurring within the last 10 years: No If all of the above answers are "NO", then may proceed with Cephalosporin use.    FAMILY HISTORY:  family history includes Cancer in her maternal grandmother; Cancer (age of onset: 23) in her maternal aunt and maternal aunt; Diabetes in her maternal grandmother; Hypertension in her mother. SOCIAL HISTORY:  reports that she has quit smoking. Her smoking use included cigarettes. She has never used smokeless tobacco. She reports that she drinks about 2.4 - 3.6 oz of alcohol per week. She reports that she does not use drugs.  REVIEW OF SYSTEMS:   Constitutional: Negative for fever, chills, weight loss, malaise/fatigue and diaphoresis.  HENT: Negative for hearing loss, ear pain, nosebleeds, congestion, sore throat, neck pain, tinnitus and ear discharge.   Eyes: Negative for blurred vision, double vision, photophobia, pain, discharge and redness.  Respiratory: Nonproductive cough, hemoptysis, some sputum production, shortness of breath, wheezing and stridor.   Cardiovascular: Negative for chest pain, palpitations, orthopnea, claudication, leg swelling and PND.  Gastrointestinal: Negative for heartburn, nausea, vomiting, abdominal pain, diarrhea, constipation, blood in stool and melena.  Genitourinary: Negative for dysuria, urgency, frequency, hematuria and flank pain.  Musculoskeletal: Negative for myalgias, back pain, joint pain and falls.  Skin: Negative for itching and rash.  Neurological: Negative for dizziness, tingling, tremors, sensory change, speech change, focal weakness, seizures, loss of consciousness, weakness and headaches.  Endo/Heme/Allergies: Negative for environmental allergies and polydipsia. Does not bruise/bleed easily.  SUBJECTIVE:   VITAL SIGNS: Temp:  [98.9 F (37.2 C)-99.4 F (37.4 C)] 99 F  (37.2 C) (04/04 0552) Pulse Rate:  [82-100] 85 (04/04 0552) Resp:  [9-32] 20 (04/04 0552) BP: (111-142)/(54-83) 142/71 (04/04 0552) SpO2:  [86 %-98 %] 96 % (04/04 0552) Weight:  [426 lb (193.2 kg)-433 lb 3.2 oz (196.5 kg)] 427 lb 11.2 oz (194 kg) (04/04 0600)  PHYSICAL EXAMINATION: General: Pleasant 42 year old female resting in bed no acute distress Neuro: Awake oriented no focal deficits HEENT: Normal cephalic atraumatic mucous membranes moist no jugular venous distention Cardiovascular: Regular rate and rhythm Lungs: Increased bases no wheeze rhonchi Abdomen: Soft nontender Musculoskeletal: Equal strength and bulk Skin: Dry, changes with marked edema  Recent Labs  Lab 06/26/17 1520 06/27/17 0519  NA 140 141  K 4.3 4.4  CL 101 99*  CO2 33* 34*  BUN 21* 15  CREATININE 1.03* 0.84  GLUCOSE 83 101*   Recent Labs  Lab 06/26/17 1520 06/27/17 0519  HGB 13.3 13.5  HCT 44.8 47.5*  WBC 8.0 7.3  PLT 393 404*   Dg Chest 2 View  Result Date: 06/26/2017 CLINICAL DATA:  10 pound weight gain since hospital discharge June 15, 2017. Extremity swelling. EXAM: CHEST - 2 VIEW COMPARISON:  Chest radiograph June 10, 2017 FINDINGS: Similar to worsening cardiomegaly. Pulmonary vascular congestion without pleural effusion or focal consolidation. No pneumothorax. Large body habitus. IMPRESSION: Similar to worsening  cardiomegaly with pulmonary vascular congestion. Electronically Signed   By: Elon Alas M.D.   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 Superimposed on underlying severe secondary Pulmonary hypertension, prior recurrent pulmonary emboli and untreated sleep apnea.   adherence to her medication regimen  playing a role here, also not treating her sleep apnea. I also wonder if she really may have needed oxygen w/ exertion all along.    Plan/rec Cont to push diuresis Cont supplemental oxygen w/ sat goal > 90% HS BIPAP (titration study required CPAP titration up to 20 cmH2O, then snoring resolved w/ settings of 24/19).  Stop smoking Continue anticoagulation   Erick Colace ACNP-BC [place] Pager # [phone] OR # [phone] if no answer   06/27/2017, 10:53 AM

## 2017-06-27 NOTE — Consult Note (Addendum)
Cardiology Consultation:   Patient ID: Alexa [name]; [id number]; Oct 03, 1975   Admit date: 06/26/2017 Date of Consult: 06/27/2017  Primary Care Provider: Joretta Bachelor, PA Primary Cardiologist: Quay Burow, MD Primary Electrophysiologist:  NA   Patient Profile:   Alexa [name] is a 42 y.o. female with a hx of submassive PE [name], aortic thrombus, on coumadin, morbid obesity, neuropathy in feet, sleep apnea who is being seen today for the evaluation of acute HF at the request of Dr. Maryland Pink.  History of Present Illness:   Alexa [name], aortic thrombus, on coumadin, morbid obesity, neuropathy in feet, sleep apnea and last echo 06/11/17 EF 70-75% with dynamic obstruction, pk velocity of 250 cm/sec and pk gradient of 25 mmHg, no RWMA, ventricular septum with diastolic flattening and systolic flattening, consistent with RV volume and pressure overload, RV was moderately dilated, RA moderately dilated, TV with moderate to severe regurg.  PA peak pressure 80 mmHg- these suggest chronic cor pulmonale, with superimposed acute event ,  RH overload increased since 07/2016.  Alexa was admitted in March 2019 with discharge 3/23 for acute hypoxic resp. Failure due to diastolic HF. Diuresed and resolution of hypoxia.  Was to follow up with cardiology but readmitted.  On that admit she was subtherapeutic on INR.    Alexa [name], edema and SOB. It was noted she had not doubled up on lasix as instructed at discharge, her OSA is not treated.   She was given lasix total of 60 mg in ER and another 60 mg at 0830 this AM.  She is negative 730.  Wt is down from 433 to 427 lbs.    EKG      I personally reviewed  SR no acute changes Troponin 0.00 Na 141, K+ 4.4, CL 99, C02 34, BUN 15, Cr 0.84   BNP 136 Hgb 13.5  WBC 7.3, plts 404  INR 4.40 on admit  HCG <5.0  CXR Similar to worsening cardiomegaly with pulmonary vascular Congestion.  Currently  Alexa is feeling better.  She stated after 4 days the fluid came back and she admits to drinking a lot of fluid.  Also states the lasix does not cause as much urination as it did.  No chest pain, no SOB now.  Can rest at 20% without SOB.  Urine pink.    Past Medical History:  Diagnosis Date  . Gestational diabetes   . Hypertension     Past Surgical History:  Procedure Laterality Date  . CESAREAN SECTION       Home Medications:  Prior to Admission medications   Medication Sig Start Date End Date Taking? Authorizing Provider  acetaminophen (TYLENOL) 500 MG tablet Take 1,000 mg by mouth daily as needed for mild pain.   Yes [provider]  cetirizine (ZYRTEC) 10 MG tablet Take 1 tablet (10 mg total) by mouth daily. 12/11/16  Yes Ivar Drape D, PA  ferrous sulfate 325 (65 FE) MG tablet Take 325 mg by mouth 2 (two) times daily with a meal.    Yes [provider]  furosemide (LASIX) 40 MG tablet Take 1 tablet (40 mg total) by mouth daily. 04/08/17  Yes English, Colletta Maryland D, PA  gabapentin (NEURONTIN) 300 MG capsule Take 1 capsule (300 mg total) by mouth 2 (two) times daily. 04/08/17  Yes Ivar Drape D, PA  losartan (COZAAR) 100 MG tablet take 1 tablet by mouth once daily 01/30/17  Yes Ivar Drape D, PA  metFORMIN (GLUCOPHAGE) 500 MG tablet take 1 tablet by mouth twice a day with meals 04/30/17  Yes Sagardia, Ines Bloomer, MD  warfarin (COUMADIN) 7.5 MG tablet take 1 and 1/2 tablet to 2 tablets once daily as directed BY COUMADIN CLINIC 06/07/17  Yes Kerin Ransom K, PA-C  glucose blood test strip ascensia  Meter/strips preferred Test daily Use as instructed Dx.dm 06/23/16   Joretta Bachelor, PA  Lancets [facility] ULTRASOFT) lancets Test daily Use as instructed dm2 06/22/16   Ivar Drape D, PA  nystatin (MYCOSTATIN/NYSTOP) powder Apply topically 4 (four) times daily. Patient not taking: Reported on 06/26/2017 03/24/16   Shawnee Knapp, MD    Inpatient  Medications: Scheduled Meds: . furosemide  60 mg Intravenous Q12H  . gabapentin  300 mg Oral BID  . insulin aspart  0-20 Units Subcutaneous TID WC  . insulin aspart  0-5 Units Subcutaneous QHS  . losartan  100 mg Oral Daily  . sodium chloride flush  3 mL Intravenous Q12H   Continuous Infusions: . sodium chloride     PRN Meds: sodium chloride, acetaminophen, ondansetron (ZOFRAN) IV, sodium chloride flush  Allergies:    Allergies  Allergen Reactions  . Penicillins Hives    Has patient had a PCN reaction causing immediate rash, facial/tongue/throat swelling, SOB or lightheadedness with hypotension: No Has patient had a PCN reaction causing severe rash involving mucus membranes or skin necrosis: No Has patient had a PCN reaction that required hospitalization No Has patient had a PCN reaction occurring within the last 10 years: No If all of the above answers are "NO", then may proceed with Cephalosporin use.    Social History:   Social History   Socioeconomic History  . Marital status: Divorced    Spouse name: n/a  . Number of children: 0  . Years of education: Not on file  . Highest education level: Not on file  Occupational History  . 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 Financial resource strain: Not on file  . Food insecurity:    Worry: Not on file    Inability: Not on file  . Transportation needs:    Medical: Not on file    Non-medical: Not on file  Tobacco Use  . Smoking status: Former Smoker    Types: Cigarettes  . Smokeless tobacco: Never Used  . Tobacco comment: only smokes when she drinks alcohol - 1 pack per week  Substance and Sexual Activity  . Alcohol use: Yes    Alcohol/week: 2.4 - 3.6 oz    Types: 4 - 6 Standard drinks or equivalent per week    Comment: mixed drinks 3-4 times/weeks  . Drug use: No  . Sexual activity: Not on file  Lifestyle  . Physical activity:    Days per week: Not on file    Minutes per  session: Not on file  . Stress: Not on file  Relationships  . Social connections:    Talks on phone: Not on file    Gets together: Not on file    Attends religious service: Not on file    Active member of club or organization: Not on file    Attends meetings of clubs or organizations: Not on file    Relationship status: Not on file  . Intimate partner violence:    Fear of current or ex partner: Not on file    Emotionally abused: Not on  file    Physically abused: Not on file    Forced sexual activity: Not on file  Other Topics Concern  . Not on file  Social History Narrative   Her children live with her.   Ex-husband lives locally.    Family History:    Family History  Problem Relation Age of Onset  . Diabetes Maternal Grandmother   . Cancer Maternal Grandmother        breast cancer   . Hypertension Mother   . Cancer Maternal Aunt 55       breast cancer   . Cancer Maternal Aunt 55       breast cancer      ROS:  Please see the history of present illness.  General:no colds or fevers, + weight increase after 4 days post discharge Skin:no rashes or ulcers HEENT:no blurred vision, no congestion CV:see HPI PUL:see HPI GI:no diarrhea constipation or melena, no indigestion GU:no hematuria, no dysuria MS:no joint pain, no claudication Neuro:no syncope, no lightheadedness Endo:no diabetes, no thyroid disease  All other ROS reviewed and negative.     Physical Exam/Data:   Vitals:   06/26/17 1844 06/26/17 1939 06/27/17 0552 06/27/17 0600  BP: 138/83 135/68 (!) 142/71   Pulse: 85 87 85   Resp: (!) 25 18 20    Temp:  99.4 F (37.4 C) 99 F (37.2 C)   TempSrc:  Oral Oral   SpO2: 94% 97% 96%   Weight:  (!) 433 lb 3.2 oz (196.5 kg)  (!) 427 lb 11.2 oz (194 kg)  Height:  5\' 5"  (1.651 m)      Intake/Output Summary (Last 24 hours) at 06/27/2017 0815 Last data filed at 06/27/2017 0600 Gross per 24 hour  Intake 720 ml  Output 1450 ml  Net -730 ml   Filed Weights    06/26/17 1445 06/26/17 1939 06/27/17 0600  Weight: (!) 426 lb (193.2 kg) (!) 433 lb 3.2 oz (196.5 kg) (!) 427 lb 11.2 oz (194 kg)   Body mass index is 71.17 kg/m.  General:  Well nourished, well developed, in no acute distress, feeling better this AM HEENT: normal Lymph: no adenopathy Neck: no JVD at 20% Endocrine:  No thryomegaly Vascular: No carotid bruits; 1+ pedal pulses bul   Cardiac:  normal S1, S2; RRR; no murmur gallup rub or click Lungs:  clear to auscultation bilaterally, diminished in bases, no wheezing, rhonchi or rales  Abd: obese, soft, nontender, no hepatomegaly  Ext: + edema 2+ and obese legs  Musculoskeletal:  No deformities, BUE and BLE strength normal and equal Skin: warm and dry  Neuro:  Alert and oriented X 3 MAE follows commands , no focal abnormalities noted Psych:  Normal affect    Telemetry:  Telemetry was personally reviewed and demonstrates:  SR  Relevant CV Studies: Echo 07/31/16 Study Conclusions  - Left ventricle: The cavity size was normal. Systolic function was   normal. The estimated ejection fraction was in the range of 60%   to 65%. Wall motion was normal; there were no regional wall   motion abnormalities. Left ventricular diastolic function   parameters were normal. - Pulmonary arteries: Systolic pressure was mildly increased. PA   peak pressure: 36 mm Hg (S).  Impressions:  - Compared to 05/15/2016, PAP is lower and signs of RV overload   have resolved.   Echo 05/2017 Study Conclusions  - Left ventricle: The cavity size was normal. There was mild   concentric hypertrophy. Systolic function was  hyperdynamic. The   estimated ejection fraction was in the range of 70% to 75%. There   was dynamic obstruction. There was dynamic obstruction at restin   the mid cavity, with a peak velocity of 250 cm/sec and a peak   gradient of 25 mm Hg. Wall motion was normal; there were no   regional wall motion abnormalities. Left ventricular  diastolic   function parameters were normal. - Ventricular septum: The contour showed diastolic flattening and   systolic flattening. These changes are consistent with RV volume   and pressure overload. - Right ventricle: The cavity size was moderately dilated. Wall   thickness was normal. - Right atrium: The atrium was moderately dilated. - Tricuspid valve: There was moderate-severe regurgitation directed   centrally. - Pulmonary arteries: Systolic pressure was severely increased. PA   peak pressure: 80 mm Hg (S).  Impressions:  - Findings suggest chronic cor pulmonale, but superimposed acute   event (e.g. pulmonary embolism, acute hypoxic respiratory   failure, etc.) is likely present. Right heart overload findings   have worsened ince the precious study.  Laboratory Data:  Chemistry Recent Labs  Lab 06/26/17 1520 06/27/17 0519  NA 140 141  K 4.3 4.4  CL 101 99*  CO2 33* 34*  GLUCOSE 83 101*  BUN 21* 15  CREATININE 1.03* 0.84  CALCIUM 8.7* 8.6*  GFRNONAA >60 >60  GFRAA >60 >60  ANIONGAP 6 8    No results for input(s): PROT, ALBUMIN, AST, ALT, ALKPHOS, BILITOT in the last 168 hours. Hematology Recent Labs  Lab 06/26/17 1520 06/27/17 0519  WBC 8.0 7.3  RBC 5.10 5.21*  HGB 13.3 13.5  HCT 44.8 47.5*  MCV 87.8 91.2  MCH 26.1 25.9*  MCHC 29.7* 28.4*  RDW 18.9* 19.5*  PLT 393 404*   Cardiac EnzymesNo results for input(s): TROPONINI in the last 168 hours.  Recent Labs  Lab 06/26/17 1529  TROPIPOC 0.00    BNP Recent Labs  Lab 06/26/17 1520  BNP 136.0*    DDimer No results for input(s): DDIMER in the last 168 hours.  Radiology/Studies:  Dg Chest 2 View  Result Date: 06/26/2017 CLINICAL DATA:  10 pound weight [name] since hospital discharge June 15, 2017. Extremity swelling. EXAM: CHEST - 2 VIEW COMPARISON:  Chest radiograph June 10, 2017 FINDINGS: Similar to worsening cardiomegaly. Pulmonary vascular congestion without pleural effusion or focal  consolidation. No pneumothorax. Large body habitus. IMPRESSION: Similar to worsening cardiomegaly with pulmonary vascular congestion. Electronically Signed   By: Elon Alas M.D.   On: 06/26/2017 16:13    Assessment and Plan:   1. Acute diastolic HF with EF 73-71% with dynamic obstruction mid cavity.  Also non compliance with meds.       On lasix 60 IV BID  Neg 730,  Cr 0.84 and BNP of 136 ? Change or decrease losartan and use cardizem?  Dr.  Sallyanne Kuster to see.  Also may need change at discharge to toresmide.     2. Hx of PE in 2.2018, resolved on CTA in May 2018 anticoagulation on coumadin   3.    OSA needs to be set up she could not afford the $400.00 to get CPAP  4.   DM-2 per IM   5.   Morbid obesity, will discuss once fluid level decreased.       For questions or updates, please contact [place] Please consult [email or web address] for contact info under Cardiology/STEMI.   Signed, Cecilie Kicks, NP  06/27/2017 8:15  AM   I have seen and examined the patient along with Cecilie Kicks, NP.  I have reviewed the chart, notes and new data.  I agree with NP's note.  Key new complaints: Chronic edema and shortness of breath, improving with diuretics Key examination changes: Morbid obesity severely limits her physical exam but there is clear evidence of hypervolemia with right heart failure Key new findings / data: Echo and chest CT images are reviewed.  Cardiogram shows sinus rhythm without evidence of ischemia or left ventricular hypertrophy.  The axis is rightward, but there is no frank right axis deviation.  PLAN: Mrs. Stech has severe chronic cor pulmonale with right heart failure related to morbid obesity, chronic hypoventilation syndrome, obstructive sleep apnea leading to severe pulmonary artery hypertension.  The abnormalities of left ventricular systolic function reported on echocardiogram are due to underfilling of the left ventricle due to right heart failure. The treatment  of her illness consistent treatment of the pulmonary/ventilatory abnormalities, avoiding hypoxia, preventing recurrent pulmonary embolism. She does not have left heart failure and ARB's are not a necessary part of her treatment regimen.  She may or may not benefit from pulmonary vasodilators, especially since she also has a history of smoking and probable VQ mismatch.  Sanda Klein, MD, [place] [phone] 06/27/2017, 4:02 PM

## 2017-06-27 NOTE — Evaluation (Signed)
Physical Therapy Evaluation Patient Details Name: Alexa [name] MRN: [id number] DOB: 04/21/1975 Today's Date: 06/27/2017   History of Present Illness  Alexa [name] is a 42 y.o. 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 Pt currently with functional limitations due to the deficits listed below (see PT Problem List).  Pt known to me from previous admission, she is dyspneic with amb today with SpO2 decr to 87% on 2L; will benefit from continued PT while in  acute setting;   Pt will benefit from skilled PT to increase their independence and safety with mobility to allow discharge to the venue listed below.        Follow Up Recommendations No PT follow up    Equipment Recommendations  None recommended by PT    Recommendations for Other Services       Precautions / Restrictions Precautions Precaution Comments: monitor sats Restrictions Weight Bearing Restrictions: No      Mobility  Bed Mobility Overal bed mobility: Modified Independent                Transfers Overall transfer level: Modified independent Equipment used: None             General transfer comment: slightly incr time, no AD, uses cane once standing  Ambulation/Gait Ambulation/Gait assistance: Min guard;Supervision Ambulation Distance (Feet): 80 Feet Assistive device: Straight cane Gait Pattern/deviations: Step-through pattern;Wide base of support;Decreased stride length     General Gait Details: pt dyspneic with amb, SpO2= 87% on 2L,  SpO2= 93% on 2L  after 2 minute rest  Stairs            Wheelchair Mobility    Modified Rankin (Stroke Patients Only)       Balance     Sitting balance-Leahy Scale: Good     Standing balance support: Single extremity  supported;During functional activity   Standing balance comment: NT beyond fair, pt amb with wide BOS d/t body habitus, denies falls                             Pertinent Vitals/Pain Pain Assessment: No/denies pain    Home Living Family/patient expects to be discharged to:: Private residence Living Arrangements: Parent(mom)   Type of Home: House Home Access: Level entry     Home Layout: One level Home Equipment: [name] - single point Additional Comments: 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    Prior Function Level of Independence: Independent;Independent with assistive device(s)         Comments: amb with cane, sleeps in bed most of the time, breathes better when sleeps more upright, does not have a CPAP     Hand Dominance        Extremity/Trunk Assessment   Upper Extremity Assessment Upper Extremity Assessment: Overall WFL for tasks assessed    Lower Extremity Assessment Lower Extremity Assessment: Overall WFL for tasks assessed       Communication   Communication: No difficulties  Cognition Arousal/Alertness: Awake/alert Behavior During Therapy: WFL for tasks assessed/performed Overall Cognitive Status: Within Functional Limits for tasks assessed  General Comments      Exercises     Assessment/Plan    PT Assessment Patient needs continued PT services  PT Problem List Cardiopulmonary status limiting activity;Decreased activity tolerance;Decreased mobility       PT Treatment Interventions DME instruction;Gait training;Functional mobility training;Therapeutic activities;Therapeutic exercise;Patient/family education    PT Goals (Current goals can be found in the Care Plan section)  Acute Rehab PT Goals Patient Stated Goal: home soon, be off fluid restrictions  PT Goal Formulation: With patient Time For Goal Achievement: 07/11/17 Potential to Achieve Goals: Good    Frequency  Min 3X/week   Barriers to discharge        Co-evaluation               AM-PAC PT "6 Clicks" Daily Activity  Outcome Measure Difficulty turning over in bed (including adjusting bedclothes, sheets and blankets)?: None Difficulty moving from lying on back to sitting on the side of the bed? : None Difficulty sitting down on and standing up from a chair with arms (e.g., wheelchair, bedside commode, etc,.)?: A Little Help needed moving to and from a bed to chair (including a wheelchair)?: A Little Help needed walking in hospital room?: A Little Help needed climbing 3-5 steps with a railing? : A Lot 6 Click Score: 19    End of Session Equipment Utilized During Treatment: Gait belt Activity Tolerance: Patient tolerated treatment well Patient left: with call bell/phone within reach;Other (comment);in bed;with family/visitor present(MD)   PT Visit Diagnosis: Difficulty in walking, not elsewhere classified (R26.2)    Time: 1093-2355 PT Time Calculation (min) (ACUTE ONLY): 24 min   Charges:   PT Evaluation $PT Eval Low Complexity: 1 Low PT Treatments $Gait Training: 8-22 mins   PT G Codes:          [name] 07-01-17, 11:40 AM

## 2017-06-27 NOTE — Care Management Note (Signed)
Case Management Note  Patient Details  Name: Alexa [name] MRN: [id number] Date of Birth: 1976/01/22  Subjective/Objective:  CM referral for asst w/getting bipap that has already been ordered-[facility] rep Santiago Glad will asst w/process for bipap-await recc/outcome.                  Action/Plan:d/c home.   Expected Discharge Date:  (unknown)               Expected Discharge Plan:  Home/Self Care  In-House Referral:     Discharge planning Services  CM Consult  Post Acute Care Choice:    Choice offered to:     DME Arranged:    DME Agency:     HH Arranged:    HH Agency:     Status of Service:  In process, will continue to follow  If discussed at Long Length of Stay Meetings, dates discussed:    Additional Comments:  Dessa Phi, RN 06/27/2017, 1:08 PM

## 2017-06-27 NOTE — Progress Notes (Signed)
[place] for Warfarin Indication: Hx pulmonary embolus and DVT, aortic thrombus  Allergies  Allergen Reactions  . Penicillins Hives    Has patient had a PCN reaction causing immediate rash, facial/tongue/throat swelling, SOB or lightheadedness with hypotension: No Has patient had a PCN reaction causing severe rash involving mucus membranes or skin necrosis: No Has patient had a PCN reaction that required hospitalization No Has patient had a PCN reaction occurring within the last 10 years: No If all of the above answers are "NO", then may proceed with Cephalosporin use.    Patient Measurements: Height: 5\' 5"  (165.1 cm) Weight: (!) 427 lb 11.2 oz (194 kg) IBW/kg (Calculated) : 57   Vital Signs: Temp: 99 F (37.2 C) (04/04 0552) Temp Source: Oral (04/04 0552) BP: 142/71 (04/04 0552) Pulse Rate: 85 (04/04 0552)  Labs: Recent Labs    06/26/17 1520 06/27/17 0519  HGB 13.3 13.5  HCT 44.8 47.5*  PLT 393 404*  LABPROT 39.0* 32.1*  INR 4.04* 3.15  CREATININE 1.03* 0.84    Estimated Creatinine Clearance: 158.7 mL/min (by C-G formula based on SCr of 0.84 mg/dL).   Medical History: Past Medical History:  Diagnosis Date  . Gestational diabetes   . Hypertension     Medications:  Scheduled:  . furosemide  60 mg Intravenous Q12H  . gabapentin  300 mg Oral BID  . insulin aspart  0-20 Units Subcutaneous TID WC  . insulin aspart  0-5 Units Subcutaneous QHS  . losartan  100 mg Oral Daily  . sodium chloride flush  3 mL Intravenous Q12H   Infusions:  . 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 Admitted 06/26/17 with shortness of breath, patient recently admitted with the same complaint.  INR supratherapeutic on admission. Pharmacy is consulted to continue warfarin  dosing.  - PTA warfarin regimen: 15 mg daily except 11.25 on MWF  Today, 06/27/2017: - INR down from 4.04 to 3.15 with dose held on 4/3 - cbc stable - no bleeding documented - no signif. Drug-drug intxns  Goal of Therapy:  INR 2-3  Plan:  - warfarin 10 mg PO x1 today - daily INR - monitor for s/s bleeding  Dia Sitter, PharmD, BCPS 06/27/2017 8:47 AM

## 2017-06-28 DIAGNOSIS — I50813 Acute on chronic right heart failure: Secondary | ICD-10-CM

## 2017-06-28 LAB — BASIC METABOLIC PANEL
ANION GAP: 9 (ref 5–15)
BUN: 14 mg/dL (ref 6–20)
CHLORIDE: 95 mmol/L — AB (ref 101–111)
CO2: 34 mmol/L — ABNORMAL HIGH (ref 22–32)
Calcium: 8.9 mg/dL (ref 8.9–10.3)
Creatinine, Ser: 0.89 mg/dL (ref 0.44–1.00)
GFR calc Af Amer: >60 mL/min (ref >60)
GLUCOSE: 100 mg/dL — AB (ref 65–99)
POTASSIUM: 4.1 mmol/L (ref 3.5–5.1)
SODIUM: 138 mmol/L (ref 135–145)

## 2017-06-28 LAB — GLUCOSE, CAPILLARY
GLUCOSE-CAPILLARY: 86 mg/dL (ref 65–99)
GLUCOSE-CAPILLARY: 100 mg/dL — AB (ref 65–99)
GLUCOSE-CAPILLARY: 104 mg/dL — AB (ref 65–99)
Glucose-Capillary: 96 mg/dL (ref 65–99)

## 2017-06-28 LAB — PROTIME-INR
INR: 2.11
Prothrombin Time: 23.5 seconds — ABNORMAL HIGH (ref 11.4–15.2)

## 2017-06-28 MED ORDER — WARFARIN SODIUM 5 MG PO TABS
15.0000 mg | ORAL_TABLET | Freq: Once | ORAL | Status: AC
  Administered 2017-06-28: 15 mg via ORAL
  Filled 2017-06-28: qty 3

## 2017-06-28 NOTE — Progress Notes (Signed)
Spoke to [facility] who followed patient for bipap in Nov 2018-I have faxed the bipap order to fax#[phone]-they will send to insurance company & let patient know what is needed from [facility]. They will accept the current sleep study unless the insurance company says a new sleep study is needed. The prior issue was the co pay(the patient's obligation). MD/critical care updated.

## 2017-06-28 NOTE — Progress Notes (Signed)
Called to patient room for inability to tolerate nocturnal BiPAP. Patient complains of dry mouth and throat due to high pressures. Education provided on pro/cons to nocturnal use and what options follow if noncompliance continues, up to and including tracheostomy with some patients who lose their ability to maintain proper gas exchange. She is reluctant to try the BiPAP again due to her fluid restrictions and dryness, but has agreed to try wearing it until at least 3 am. If she tolerates it that long, it will be an approximate total use of 6.5 hours tonight. RT will continue to follow and encourage night time use. RN updated and aware that the patient is compliant and remains on full face mask.

## 2017-06-28 NOTE — Care Management Note (Signed)
Case Management Note  Patient Details  Name: Alexa [name] MRN: [id number] Date of Birth: 1975/07/26  Subjective/Objective: CM referral for Bipap-checked [facility] rep Karen-patient had received auth for Bipap in past, sleep study done in 2018(would need a new sleep study set up @ d/c if Bipap is needed-co pay was $400-patient did not pay the co pay. [facility] will talk to patient about process for a new Bipap. This would not be set up prior to d/c since the sleep study is an otpt service.  Patient informed of process & agreed to talk to [facility].                   Action/Plan:d/c plan home.   Expected Discharge Date:  (unknown)               Expected Discharge Plan:  Home/Self Care  In-House Referral:     Discharge planning Services  CM Consult  Post Acute Care Choice:    Choice offered to:     DME Arranged:    DME Agency:     HH Arranged:    HH Agency:     Status of Service:  In process, will continue to follow  If discussed at Long Length of Stay Meetings, dates discussed:    Additional Comments:  Dessa Phi, RN 06/28/2017, 8:49 AM

## 2017-06-28 NOTE — Progress Notes (Signed)
   Name: Alexa [name] MRN: [id number] DOB: 08/05/1975    ADMISSION DATE:  06/26/2017 CONSULTATION DATE:  4/4  REFERRING MD :  [place] Pink  CHIEF COMPLAINT:  Acute dyspnea w/ h/o recurrent PE  BRIEF PATIENT DESCRIPTION:  42 year old female with significant history of obesity, diastolic heart dysfunction, pulmonary hypertension, cor pulmonale.  Admitted with decompensated diastolic heart failure, cor pulmonale, volume overload.  SIGNIFICANT EVENTS    STUDIES:  ECHO 3/19: chronic cor pulmonale, EF 60-63% LV diastolic fxn nml, RV dilated. PAP estimated at 73mmHg   SUBJECTIVE:  Better Tolerated BIPAP last night   VITAL SIGNS: Temp:  [98 F (36.7 C)-98.5 F (36.9 C)] 98 F (36.7 C) (04/05 0521) Pulse Rate:  [81-92] 81 (04/05 0521) Resp:  [16-22] 18 (04/05 0521) BP: (120-139)/(63-93) 139/93 (04/05 0521) SpO2:  [92 %-98 %] 92 % (04/05 0521) Weight:  [417 lb 1.8 oz (189.2 kg)] 417 lb 1.8 oz (189.2 kg) (04/05 0521) 1 liter [place]   Intake/Output Summary (Last 24 hours) at 06/28/2017 1313 Last data filed at 06/28/2017 1048 Gross per 24 hour  Intake 480 ml  Output 5150 ml  Net -4670 ml    PHYSICAL EXAMINATION: General: 42 year old aaf. Resting in chair. No distress Neuro: awake and oriented no focal def  HEENT: NCAT no JVD MMM Cardiovascular:RRR w/out MRG Lungs: decreased bases no accessory use  Abdomen: soft not tender + bowel sounds  Musculoskeletal: equal st and bulk Skin LE w/ chronic venous changes   Recent Labs  Lab 06/26/17 1520 06/27/17 0519 06/28/17 0503  NA 140 141 138  K 4.3 4.4 4.1  CL 101 99* 95*  CO2 33* 34* 34*  BUN 21* 15 14  CREATININE 1.03* 0.84 0.89  GLUCOSE 83 101* 100*   Recent Labs  Lab 06/26/17 1520 06/27/17 0519  HGB 13.3 13.5  HCT 44.8 47.5*  WBC 8.0 7.3  PLT 393 404*   Dg Chest 2 View  Result Date: 06/26/2017 CLINICAL DATA:  10 pound weight gain since hospital discharge June 15, 2017. Extremity swelling. EXAM: CHEST - 2 VIEW  COMPARISON:  Chest radiograph June 10, 2017 FINDINGS: Similar to worsening cardiomegaly. Pulmonary vascular congestion without pleural effusion or focal consolidation. No pneumothorax. Large body habitus. IMPRESSION: Similar to worsening cardiomegaly with pulmonary vascular congestion. Electronically Signed   By: Elon Alas M.D.   On: 06/26/2017 16:13    ASSESSMENT / PLAN:  Acute on chronic hypoxic respiratory failure Severe untreated sleep apnea (PSG notes showing saturations as low as 50s and required BIPAP level support of 24/19) Severe pulmonary hypertension Unprovoked Recurrent thromboembolic disease w/ recurrent PE (on chronic anticoagulation) Acute diastolic HF Acute Cor Pulmonale Pulmonary Edema  Peripheral edema Medication nonadherent  Diabetes  Acute on chronic hypoxic respiratory failure in setting of acute diastolic HF, decompensated cor pulmonale and pulmonary edema. Superimposed on underlying severe secondary Pulmonary hypertension, prior recurrent pulmonary emboli and untreated sleep apnea.   adherence to her medication regimen  playing a role here, also not treating her sleep apnea. I also wonder if she really may have needed oxygen w/ exertion all along.    Plan/rec  Cont lasix Cont wean Oxygen NEEDS nocturnal BIPAP Cont anticoagulation Happy to speak to insurance medical rep if that would help   We will be available PRN if needed  Erick Colace ACNP-BC [place] Pager # [phone] OR # [phone] if no answer   06/28/2017, 1:08 PM

## 2017-06-28 NOTE — Progress Notes (Signed)
Pt is neg 6,060 and wt down from 433 to 417 lbs.  Cardiology will see in AM to see if can go back to po and discharge dose.   Pulmonary will take over her sleep apnea.

## 2017-06-28 NOTE — Progress Notes (Signed)
[facility] PROGRESS NOTE  Alexa [name] AST:[id number] DOB: November 21, 1975 DOA: 06/26/2017  PCP: Joretta Bachelor, PA  Brief History/Interval Summary: 42 year old African-American female with a past medical history of morbid obesity, pulmonary embolism on chronic anticoagulation, obstructive sleep apnea not on treatment, recent hospitalization for acute congestive heart failure thought to be predominantly right-sided, pulmonary hypertension, diabetes mellitus type 2 who was discharged on March 23 after she spent a few days in the hospital being diuresed.  She presented to the hospital with complains of worsening shortness of breath, weight gain.  She was noted to be hypoxic.  She was noted to have volume overload.  She was hospitalized for further management.  Reason for Visit: Acute right-sided congestive heart failure  Consultants: Cardiology.  Pulmonology  Procedures: None  Antibiotics: None  Subjective/Interval History: Patient feels well.  Shortness of breath is improving.  Next swelling is improving.  Still does not appear to be back to her usual.  Denies any chest pain.    ROS: No nausea or vomiting  Objective:  Vital Signs  Vitals:   06/27/17 0600 06/27/17 2037 06/27/17 2100 06/28/17 0521  BP:   120/63 (!) 139/93  Pulse:  92 81 81  Resp:  (!) 22 16 18   Temp:   98.5 F (36.9 C) 98 F (36.7 C)  TempSrc:   Oral Oral  SpO2:  98% 96% 92%  Weight: (!) 194 kg (427 lb 11.2 oz)   (!) 189.2 kg (417 lb 1.8 oz)  Height:        Intake/Output Summary (Last 24 hours) at 06/28/2017 1052 Last data filed at 06/28/2017 1048 Gross per 24 hour  Intake 480 ml  Output 5150 ml  Net -4670 ml   Filed Weights   06/26/17 1939 06/27/17 0600 06/28/17 0521  Weight: (!) 196.5 kg (433 lb 3.2 oz) (!) 194 kg (427 lb 11.2 oz) (!) 189.2 kg (417 lb 1.8 oz)    General appearance: Awake alert.  In no distress.  Morbidly obese. Resp: Diminished air entry at the bases.  No wheezing rales  or rhonchi.  Mildly tachypneic at rest. Cardio: S1-S2 is normal regular.  No S3-S4.  No rubs murmurs of bruit GI: Abdomen is soft.  Nontender nondistended.  Bowel sounds are present.  No masses organomegaly Extremities: Lower extremity edema is improving. Neurologic: No obvious focal neurological deficits  Lab Results:  Data Reviewed: I have personally reviewed following labs and imaging studies  CBC: Recent Labs  Lab 06/26/17 1520 06/27/17 0519  WBC 8.0 7.3  NEUTROABS 5.3 5.0  HGB 13.3 13.5  HCT 44.8 47.5*  MCV 87.8 91.2  PLT 393 404*    Basic Metabolic Panel: Recent Labs  Lab 06/26/17 1520 06/27/17 0519 06/28/17 0503  NA 140 141 138  K 4.3 4.4 4.1  CL 101 99* 95*  CO2 33* 34* 34*  GLUCOSE 83 101* 100*  BUN 21* 15 14  CREATININE 1.03* 0.84 0.89  CALCIUM 8.7* 8.6* 8.9    GFR: Estimated Creatinine Clearance: 147.2 mL/min (by C-G formula based on SCr of 0.89 mg/dL).  Coagulation Profile: Recent Labs  Lab 06/26/17 1520 06/27/17 0519 06/28/17 0503  INR 4.04* 3.15 2.11    CBG: Recent Labs  Lab 06/27/17 0757 06/27/17 1226 06/27/17 1706 06/27/17 2109 06/28/17 0716  GLUCAP 83 93 101* 133* 86      Radiology Studies: Dg Chest 2 View  Result Date: 06/26/2017 CLINICAL DATA:  10 pound weight gain since hospital discharge June 15, 2017. Extremity swelling. EXAM: CHEST - 2 VIEW COMPARISON:  Chest radiograph June 10, 2017 FINDINGS: Similar to worsening cardiomegaly. Pulmonary vascular congestion without pleural effusion or focal consolidation. No pneumothorax. Large body habitus. IMPRESSION: Similar to worsening cardiomegaly with pulmonary vascular congestion. Electronically Signed   By: Elon Alas M.D.   On: 06/26/2017 16:13     Medications:  Scheduled: . furosemide  60 mg Intravenous Q12H  . gabapentin  300 mg Oral BID  . insulin aspart  0-15 Units Subcutaneous TID WC  . insulin aspart  0-5 Units Subcutaneous QHS  . losartan  100 mg Oral Daily    . sodium chloride flush  3 mL Intravenous Q12H  . warfarin  15 mg Oral ONCE-1800  . Warfarin - Pharmacist Dosing Inpatient   Does not apply q1800   Continuous: . sodium chloride     WCB:JSEGBT chloride, acetaminophen, ondansetron (ZOFRAN) IV, sodium chloride flush  Assessment/Plan:  Principal Problem:   Acute respiratory failure with hypoxia (HCC) Active Problems:   BMI 70 and over, adult (HCC)   Anticoagulated on Coumadin   Acute CHF (congestive heart failure) (HCC)   OSA (obstructive sleep apnea)   Pulmonary hypertension (HCC)   Acute on chronic right-sided heart failure (HCC)   COR (chronic cor pulmonale) (HCC)   Pulmonary artery hypertension (HCC)   Super obesity    Acute CHF likely diastolic versus right-sided/Acute hypoxic respiratory failure Patient continues to improve.  She is diuresing well.  Weight is decreasing.  Continue strict ins and outs and daily weights.  Continue IV diuretics.  Appreciate cardiology input.  They continue to follow.  Wean down oxygen as tolerated.  Wonder if she is going to need oxygen at home.  History of unprovoked pulmonary embolism, on chronic anticoagulation/Pulmonary hypertension  A supratherapeutic.  No normal.  Pharmacy is following.  Pulmonology was consulted and we appreciate their assistance.  Patient also followed by hematology as an outpatient.    Obstructive sleep apnea This was diagnosed in September 2018.  Her AHI was greater than 80 with multiple episodes of oxygen desaturations.  BiPAP was ordered at that time however patient found herself unable to make the co-pay and hands she never was able to get the BiPAP.  Same issue remains now.  Case manager is following the patient.  Pulmonology is also following.    Diabetes mellitus type 2 in obese patient SSI.  Hold metformin for now.  HbA1c was 5.7 in March.  Morbid obesity BMI greater than 70.  Abdominal adenopathy This was incidentally detected on a CT scan from last year.   Hematology had plan to do a repeat scan in March but it does not look like patient has had one.  This can be pursued as an outpatient.  DVT Prophylaxis: On warfarin    Code Status: Full code Family Communication: Discussed with the patient Disposition Plan: Management as outlined above.  Continue to mobilize.  Will need home O2 assessment.  Await further specialty input.    LOS: 2 days   [place] [facility] Pager [phone] 06/28/2017, 10:52 AM  If 7PM-7AM, please contact night-coverage at [email or web address], password [facility]

## 2017-06-28 NOTE — Progress Notes (Signed)
Assumed care of pt from previous nurse. Agree with previous nurses assessment. Will continue to monitor.  

## 2017-06-28 NOTE — Progress Notes (Signed)
[place] for Warfarin Indication: Hx pulmonary embolus and DVT, aortic thrombus  Allergies  Allergen Reactions  . Penicillins Hives    Has patient had a PCN reaction causing immediate rash, facial/tongue/throat swelling, SOB or lightheadedness with hypotension: No Has patient had a PCN reaction causing severe rash involving mucus membranes or skin necrosis: No Has patient had a PCN reaction that required hospitalization No Has patient had a PCN reaction occurring within the last 10 years: No If all of the above answers are "NO", then may proceed with Cephalosporin use.    Patient Measurements: Height: 5\' 5"  (165.1 cm) Weight: (!) 417 lb 1.8 oz (189.2 kg) IBW/kg (Calculated) : 57   Vital Signs: Temp: 98 F (36.7 C) (04/05 0521) Temp Source: Oral (04/05 0521) BP: 139/93 (04/05 0521) Pulse Rate: 81 (04/05 0521)  Labs: Recent Labs    06/26/17 1520 06/27/17 0519 06/28/17 0503  HGB 13.3 13.5  --   HCT 44.8 47.5*  --   PLT 393 404*  --   LABPROT 39.0* 32.1* 23.5*  INR 4.04* 3.15 2.11  CREATININE 1.03* 0.84 0.89    Estimated Creatinine Clearance: 147.2 mL/min (by C-G formula based on SCr of 0.89 mg/dL).   Medical History: Past Medical History:  Diagnosis Date  . Gestational diabetes   . Hypertension     Medications:  Scheduled:  . furosemide  60 mg Intravenous Q12H  . gabapentin  300 mg Oral BID  . insulin aspart  0-15 Units Subcutaneous TID WC  . insulin aspart  0-5 Units Subcutaneous QHS  . losartan  100 mg Oral Daily  . sodium chloride flush  3 mL Intravenous Q12H  . Warfarin - Pharmacist Dosing Inpatient   Does not apply q1800   Infusions:  . 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 Admitted 06/26/17 with shortness of breath, patient recently  admitted with the same complaint.  INR supratherapeutic on admission. Pharmacy is consulted to continue warfarin dosing.  - PTA warfarin regimen: 15 mg daily except 11.25 on MWF  Today, 06/28/2017: - INR down to 2.11 with dose resumed yesteday - cbc stable (4/4) - no bleeding documented - no signif. Drug-drug intxns  Goal of Therapy:  INR 2-3  Plan:  - warfarin 15 mg PO x1 today - daily INR - monitor for s/s bleeding  Dia Sitter, PharmD, BCPS 06/28/2017 8:53 AM

## 2017-06-29 ENCOUNTER — Encounter (HOSPITAL_COMMUNITY): Payer: Self-pay

## 2017-06-29 ENCOUNTER — Telehealth: Payer: Self-pay | Admitting: Pulmonary Disease

## 2017-06-29 LAB — PROTIME-INR
INR: 1.92
Prothrombin Time: 21.8 seconds — ABNORMAL HIGH (ref 11.4–15.2)

## 2017-06-29 LAB — GLUCOSE, CAPILLARY
GLUCOSE-CAPILLARY: 97 mg/dL (ref 65–99)
Glucose-Capillary: 101 mg/dL — ABNORMAL HIGH (ref 65–99)

## 2017-06-29 LAB — BASIC METABOLIC PANEL
Anion gap: 8 (ref 5–15)
BUN: 16 mg/dL (ref 6–20)
CHLORIDE: 94 mmol/L — AB (ref 101–111)
CO2: 35 mmol/L — ABNORMAL HIGH (ref 22–32)
CREATININE: 0.88 mg/dL (ref 0.44–1.00)
Calcium: 8.6 mg/dL — ABNORMAL LOW (ref 8.9–10.3)
GFR calc non Af Amer: >60 mL/min (ref >60)
Glucose, Bld: 102 mg/dL — ABNORMAL HIGH (ref 65–99)
POTASSIUM: 4.7 mmol/L (ref 3.5–5.1)
SODIUM: 137 mmol/L (ref 135–145)

## 2017-06-29 MED ORDER — FUROSEMIDE 40 MG PO TABS: 40.0000 mg | ORAL_TABLET | Freq: Two times a day (BID) | ORAL | 1 refills | Status: DC

## 2017-06-29 MED ORDER — WARFARIN SODIUM 5 MG PO TABS: 15.0000 mg | ORAL_TABLET | Freq: Once | ORAL | Status: DC

## 2017-06-29 NOTE — Discharge Instructions (Signed)
Heart Failure °Heart failure means your heart has trouble pumping blood. This makes it hard for your body to work well. Heart failure is usually a long-term (chronic) condition. You must take good care of yourself and follow your doctor's treatment plan. °Follow these instructions at home: °· Take your heart medicine as told by your doctor. °? Do not stop taking medicine unless your doctor tells you to. °? Do not skip any dose of medicine. °? Refill your medicines before they run out. °? Take other medicines only as told by your doctor or pharmacist. °· Stay active if told by your doctor. The elderly and people with severe heart failure should talk with a doctor about physical activity. °· Eat heart-healthy foods. Choose foods that are without trans fat and are low in saturated fat, cholesterol, and salt (sodium). This includes fresh or frozen fruits and vegetables, fish, lean meats, fat-free or low-fat dairy foods, whole grains, and high-fiber foods. Lentils and dried peas and beans (legumes) are also good choices. °· Limit salt if told by your doctor. °· Cook in a healthy way. Roast, grill, broil, bake, poach, steam, or stir-fry foods. °· Limit fluids as told by your doctor. °· Weigh yourself every morning. Do this after you pee (urinate) and before you eat breakfast. Write down your weight to give to your doctor. °· Take your blood pressure and write it down if your doctor tells you to. °· Ask your doctor how to check your pulse. Check your pulse as told. °· Lose weight if told by your doctor. °· Stop smoking or chewing tobacco. Do not use gum or patches that help you quit without your doctor's approval. °· Schedule and go to doctor visits as told. °· Nonpregnant women should have no more than 1 drink a day. Men should have no more than 2 drinks a day. Talk to your doctor about drinking alcohol. °· Stop illegal drug use. °· Stay current with shots (immunizations). °· Manage your health conditions as told by your  doctor. °· Learn to manage your stress. °· Rest when you are tired. °· If it is really hot outside: °? Avoid intense activities. °? Use air conditioning or fans, or get in a cooler place. °? Avoid caffeine and alcohol. °? Wear loose-fitting, lightweight, and light-colored clothing. °· If it is really cold outside: °? Avoid intense activities. °? Layer your clothing. °? Wear mittens or gloves, a hat, and a scarf when going outside. °? Avoid alcohol. °· Learn about heart failure and get support as needed. °· Get help to maintain or improve your quality of life and your ability to care for yourself as needed. °Contact a doctor if: °· You gain weight quickly. °· You are more short of breath than usual. °· You cannot do your normal activities. °· You tire easily. °· You cough more than normal, especially with activity. °· You have any or more puffiness (swelling) in areas such as your hands, feet, ankles, or belly (abdomen). °· You cannot sleep because it is hard to breathe. °· You feel like your heart is beating fast (palpitations). °· You get dizzy or light-headed when you stand up. °Get help right away if: °· You have trouble breathing. °· There is a change in mental status, such as becoming less alert or not being able to focus. °· You have chest pain or discomfort. °· You faint. °This information is not intended to replace advice given to you by your health care provider. Make sure you   discuss any questions you have with your health care provider. Document Released: 12/20/2007 Document Revised: 08/18/2015 Document Reviewed: 04/28/2012 Elsevier Interactive Patient Education  2017 [place].   Heart Failure Eating Plan Heart failure, also called congestive heart failure, occurs when your heart does not pump blood well enough to meet your body's needs for oxygen-rich blood. Heart failure is a long-term (chronic) condition. Living with heart failure can be challenging. However, following your health care  provider's instructions about a healthy lifestyle and working with a diet and nutrition specialist (dietitian) to choose the right foods may help to improve your symptoms. What are tips for following this plan? General guidelines  Do not eat more than 2,300 mg of salt (sodium) a day. The amount of sodium that is recommended for you may be lower, depending on your condition.  Maintain a healthy body weight as directed. Ask your health care provider what a healthy weight is for you. ? Check your weight every day. ? Work with your health care provider and dietitian to make a plan that is right for you to lose weight or maintain your current weight.  Limit how much fluid you drink. Ask your health care provider or dietitian how much fluid you can have each day.  Limit or avoid alcohol as told by your health care provider or dietitian. Reading food labels  Check food labels for the amount of sodium per serving. Choose foods that have less than 140 mg (milligrams) of sodium in each serving.  Check food labels for the number of calories per serving. This is important if you need to limit your daily calorie intake to lose weight.  Check food labels for the serving size. If you eat more than one serving, you will be eating more sodium and calories than what is listed on the label.  Look for foods that are labeled as "sodium-free," "very low sodium," or "low sodium." ? Foods labeled as "reduced sodium" or "lightly salted" may still have more sodium than what is recommended for you. Cooking  Avoid adding salt when cooking. Ask your health care provider or dietitian before using salt substitutes.  Season food with salt-free seasonings, spices, or herbs. Check the label of seasoning mixes to make sure they do not contain salt.  Cook with heart-healthy oils, such as olive, canola, soybean, or sunflower oil.  Do not fry foods. Cook foods using low-fat methods, such as baking, boiling, grilling, and  broiling.  Limit unhealthy fats when cooking by: ? Removing the skin from poultry, such as chicken. ? Removing all visible fats from meats. ? Skimming the fat off from stews, soups, and gravies before serving them. Meal planning  Limit your intake of: ? Processed, canned, or pre-packaged foods. ? Foods that are high in trans fat, such as fried foods. ? Sweets, desserts, sugary drinks, and other foods with added sugar. ? Full-fat dairy products, such as whole milk.  Eat a balanced diet that includes: ? 4-5 servings of fruit each day and 4-5 servings of vegetables each day. At each meal, try to fill half of your plate with fruits and vegetables. ? Up to 6-8 servings of whole grains each day. ? Up to 2 servings of lean meat, poultry, or fish each day. One serving of meat is equal to 3 oz. This is about the same size as a deck of cards. ? 2 servings of low-fat dairy each day. ? Heart-healthy fats. Healthy fats called omega-3 fatty acids are found in foods  such as flaxseed and cold-water fish like sardines, salmon, and mackerel.  Aim to eat 25-35 g (grams) of fiber a day. Foods that are high in fiber include apples, broccoli, carrots, beans, peas, and whole grains.  Do not add salt or condiments that contain salt (such as soy sauce) to foods before eating.  When eating at a restaurant, ask that your food be prepared with less salt or no salt, if possible.  Try to eat 2 or more vegetarian meals each week.  Eat more home-cooked food and eat less restaurant, buffet, and fast food. Recommended foods The items listed may not be a complete list. Talk with your dietitian about what dietary choices are best for you. Grains Bread with less than 80 mg of sodium per slice. Whole-wheat pasta, quinoa, and brown rice. Oats and oatmeal. Barley. [place]ora. Grits and cream of wheat. Whole-grain and whole-wheat cold cereal. Vegetables All fresh vegetables. Vegetables that are frozen without sauce or added  salt. Low-sodium or sodium-free canned vegetables. Fruits All fresh, frozen, and canned fruits. Dried fruits, such as raisins, prunes, and cranberries. Meats and other protein foods Lean cuts of meat. Skinless chicken and [place]. Fish with high omega-3 fatty acids, such as salmon, sardines, and other cold-water fishes. Eggs. Dried beans, peas, and edamame. Unsalted nuts and nut butters. Dairy Low-fat or nonfat (skim) milk and dried milk. Rice milk, soy milk, and almond milk. Low-fat or nonfat yogurt. Small amounts of reduced-sodium block cheese. Low-sodium cottage cheese. Fats and oils Olive, canola, soybean, flaxseed, or sunflower oil. Avocado. Sweets and desserts Apple sauce. Granola bars. Sugar-free pudding and gelatin. Frozen fruit bars. Seasoning and other foods Fresh and dried herbs. Lemon or lime juice. Vinegar. Low-sodium ketchup. Salt-free marinades, salad dressings, sauces, and seasonings. Foods to avoid The items listed may not be a complete list. Talk with your dietitian about what dietary choices are best for you. Grains Bread with more than 80 mg of sodium per slice. Hot or cold cereal with more than 140 mg sodium per serving. Salted pretzels and crackers. Pre-packaged breadcrumbs. Bagels, croissants, and biscuits. Vegetables Canned vegetables. Frozen vegetables with sauce or seasonings. Creamed vegetables. [place] fries. Onion rings. Pickled vegetables and sauerkraut. Fruits Fruits that are dried with sodium-containing preservatives. Meats and other protein foods Ribs and chicken wings. Bacon, ham, pepperoni, bologna, salami, and packaged luncheon meats. Hot dogs, bratwurst, and sausage. Canned meat. Smoked meat and fish. Salted nuts and seeds. Dairy Whole milk, half-and-half, and cream. Buttermilk. Processed cheese, cheese spreads, and cheese curds. Regular cottage cheese. Feta cheese. Shredded cheese. String cheese. Fats and oils Butter, lard, shortening, ghee, and bacon  fat. Canned and packaged gravies. Seasoning and other foods Onion salt, garlic salt, table salt, and sea salt. Marinades. Regular salad dressings. Relishes, pickles, and olives. Meat flavorings and tenderizers, and bouillon cubes. Horseradish, ketchup, and mustard. Worcestershire sauce. Teriyaki sauce, soy sauce (including reduced sodium). Hot sauce and Tabasco sauce. Steak sauce, fish sauce, oyster sauce, and cocktail sauce. Taco seasonings. Barbecue sauce. Tartar sauce. Summary  A heart failure eating plan includes changes that limit your intake of sodium and unhealthy fat, and it may help you lose weight or maintain a healthy weight. Your health care provider may also recommend limiting how much fluid you drink.  Most people with heart failure should eat no more than 2,300 mg of salt (sodium) a day. The amount of sodium that is recommended for you may be lower, depending on your condition.  Contact your health care  provider or dietitian before making any major changes to your diet. This information is not intended to replace advice given to you by your health care provider. Make sure you discuss any questions you have with your health care provider. Document Released: 07/27/2016 Document Revised: 07/27/2016 Document Reviewed: 07/27/2016 Elsevier Interactive Patient Education  2018 [place].  Heart Failure  SPECIAL INSTRUCTIONS  AVOID STRAINING  STOP ANY ACTIVITY THAT CAUSES CHEST PAIN, SHORTNESS OF BREATH, DIZZINESS, SWEATING, OR EXCESSIVE WEAKNESS.  SPECIAL INSTRUCTIONS FOR PATIENTS WITH HEART FAILURE:  Continue to follow the instructions in your Heart Failure Patient Education information that you received during your hospital stay.  Record daily weight on same scale at same time of day.  If your doctor did not discuss your diet or activity in the information above, please follow a Heart Healthy Low Sodium diet and increase your activity as you feel able. Call your doctor:  (Anytime you feel any of the following symptoms)  3-4 pound weight gain in 1-2 days or 2 pounds overnight  Shortness of breath, with or without a dry hacking cough  Swelling in the hands, feet or stomach  If you have to sleep on extra pillows at night in order to breathe

## 2017-06-29 NOTE — Progress Notes (Addendum)
Spoke to pt. Contacted [facility] for oxygen. Portable oxygen tank to be delivered to room and oxygen concentrator to home. Sent notification to [facility] to follow up [facility]. Pt states she had a sleep study in 2018.  Jonnie Finner RN CCM Case Mgmt phone [phone]

## 2017-06-29 NOTE — Progress Notes (Signed)
SATURATION QUALIFICATIONS: (This note is used to comply with regulatory documentation for home oxygen)  Patient Saturations on Room Air at Rest = 97%  Patient Saturations on Room Air while Ambulating = 87%  Patient Saturations on 2 Liters of oxygen while Ambulating = 98%  Please briefly explain why patient needs home oxygen: Patient saturations dropped to 87% on RA while ambulating. Quickly recovered with rest and O2.

## 2017-06-29 NOTE — Progress Notes (Signed)
[facility] for Warfarin Indication: Hx pulmonary embolus and DVT, aortic thrombus  Allergies  Allergen Reactions  . Penicillins Hives    Has patient had a PCN reaction causing immediate rash, facial/tongue/throat swelling, SOB or lightheadedness with hypotension: No Has patient had a PCN reaction causing severe rash involving mucus membranes or skin necrosis: No Has patient had a PCN reaction that required hospitalization No Has patient had a PCN reaction occurring within the last 10 years: No If all of the above answers are "NO", then may proceed with Cephalosporin use.    Patient Measurements: Height: 5\' 5"  (165.1 cm) Weight: (!) 410 lb 3.2 oz (186.1 kg) IBW/kg (Calculated) : 57   Vital Signs: Temp: 98.6 F (37 C) (04/06 0537) Temp Source: Oral (04/06 0537) BP: 117/66 (04/06 0537) Pulse Rate: 76 (04/06 0537)  Labs: Recent Labs    06/26/17 1520 06/27/17 0519 06/28/17 0503 06/29/17 0515  HGB 13.3 13.5  --   --   HCT 44.8 47.5*  --   --   PLT 393 404*  --   --   LABPROT 39.0* 32.1* 23.5* 21.8*  INR 4.04* 3.15 2.11 1.92  CREATININE 1.03* 0.84 0.89 0.88    Estimated Creatinine Clearance: 147.1 mL/min (by C-G formula based on SCr of 0.88 mg/dL).   Medical History: Past Medical History:  Diagnosis Date  . Gestational diabetes   . Hypertension     Medications:  Scheduled:  . furosemide  60 mg Intravenous Q12H  . gabapentin  300 mg Oral BID  . insulin aspart  0-15 Units Subcutaneous TID WC  . insulin aspart  0-5 Units Subcutaneous QHS  . losartan  100 mg Oral Daily  . sodium chloride flush  3 mL Intravenous Q12H  . Warfarin - Pharmacist Dosing Inpatient   Does not apply q1800   Infusions:  . 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 Admitted 06/26/17 with  shortness of breath, patient recently admitted with the same complaint.  INR supratherapeutic on admission. Pharmacy is consulted to continue warfarin dosing.  - PTA warfarin regimen: 15 mg daily except 11.25 on MWF  Today, 06/29/2017: - INR is slightly subtherapeutic at 1.92, but level only decreased slightly from 2.11 yesterday - cbc stable (4/4) - no bleeding documented - no signif. Drug-drug intxns  Goal of Therapy:  INR 2-3  Plan:  - Repeat warfarin 15 mg PO x1 today - daily INR - monitor for s/s bleeding  Dia Sitter, PharmD, BCPS 06/29/2017 8:32 AM

## 2017-06-29 NOTE — Telephone Encounter (Signed)
Pt to be d/c home 4/06.   She needs HFU next week to do face to face visit and then get split night sleep study scheduled in sleep lab.  Please schedule with me or NP >> okay to double book visit with me if needed.

## 2017-06-29 NOTE — Progress Notes (Deleted)
SATURATION QUALIFICATIONS: (This note is used to comply with regulatory documentation for home oxygen)  Patient Saturations on Room Air at Rest = 97%   Patient Saturations on Room Air while Ambulating = 87%  Patient Saturations on 2 Liters of oxygen while Ambulating = 97%  Please briefly explain why patient needs home oxygen:

## 2017-06-29 NOTE — Progress Notes (Signed)
Progress Note   Subjective   Doing well today, the patient denies CP or SOB.  No new concerns  Inpatient Medications    Scheduled Meds: . furosemide  60 mg Intravenous Q12H  . gabapentin  300 mg Oral BID  . insulin aspart  0-15 Units Subcutaneous TID WC  . insulin aspart  0-5 Units Subcutaneous QHS  . losartan  100 mg Oral Daily  . sodium chloride flush  3 mL Intravenous Q12H  . warfarin  15 mg Oral ONCE-1800  . Warfarin - Pharmacist Dosing Inpatient   Does not apply q1800   Continuous Infusions: . sodium chloride     PRN Meds: sodium chloride, acetaminophen, ondansetron (ZOFRAN) IV, sodium chloride flush   Vital Signs    Vitals:   06/28/17 1445 06/28/17 2027 06/28/17 2051 06/29/17 0537  BP: 122/80  132/76 117/66  Pulse: 85 89 79 76  Resp: 18 18 18    Temp: 98 F (36.7 C)  98.5 F (36.9 C) 98.6 F (37 C)  TempSrc: Oral  Oral Oral  SpO2: 92% 94% 98% 96%  Weight:    (!) 410 lb 3.2 oz (186.1 kg)  Height:        Intake/Output Summary (Last 24 hours) at 06/29/2017 0905 Last data filed at 06/28/2017 2138 Gross per 24 hour  Intake 600 ml  Output 3700 ml  Net -3100 ml   Filed Weights   06/27/17 0600 06/28/17 0521 06/29/17 0537  Weight: (!) 427 lb 11.2 oz (194 kg) (!) 417 lb 1.8 oz (189.2 kg) (!) 410 lb 3.2 oz (186.1 kg)    Telemetry    Sinus rhythm - Personally Reviewed  Physical Exam   GEN- The patient is overweight appearing, alert and oriented x 3 today.   Head- normocephalic, atraumatic Eyes-  Sclera clear, conjunctiva pink Ears- hearing intact Oropharynx- clear Neck- supple, Lungs- decreased BS at bases, normal work of breathing Heart- Regular rate and rhythm  GI- soft, NT, ND, + BS Extremities- no clubbing, cyanosis, + dependant edema  MS- no significant deformity or atrophy Skin- no rash or lesion Psych- euthymic mood, full affect Neuro- strength and sensation are intact   Labs    Chemistry Recent Labs  Lab 06/27/17 0519 06/28/17 0503  06/29/17 0515  NA 141 138 137  K 4.4 4.1 4.7  CL 99* 95* 94*  CO2 34* 34* 35*  GLUCOSE 101* 100* 102*  BUN 15 14 16   CREATININE 0.84 0.89 0.88  CALCIUM 8.6* 8.9 8.6*  GFRNONAA >60 >60 >60  GFRAA >60 >60 >60  ANIONGAP 8 9 8      Hematology Recent Labs  Lab 06/26/17 1520 06/27/17 0519  WBC 8.0 7.3  RBC 5.10 5.21*  HGB 13.3 13.5  HCT 44.8 47.5*  MCV 87.8 91.2  MCH 26.1 25.9*  MCHC 29.7* 28.4*  RDW 18.9* 19.5*  PLT 393 404*    Cardiac EnzymesNo results for input(s): TROPONINI in the last 168 hours.  Recent Labs  Lab 06/26/17 1529  TROPIPOC 0.00        Assessment & Plan    1.  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 Appears to be clinically improved with 16 lb weight loss Would switch lasix to 40mg  PO BID Wean O2 and assess for probable home O2 requirement. Needs close outpatient follow-up with pulmonary team Ultimately, lifestyle modification, including  compliance with medicines, sodium restriction, and weight loss are going to be essential to her longevity.  Her long term prognosis is not very good.  2. Morbid obesity We discussed weight loss surgery at length today. She has attended classes previously but has been reluctant to consider surgery.  I have advised that she reconsider.  3. OSA Compliance with treatment is essential.  4. H/o PTE Lifelong anticoagulation is advised  Cardiology team to see as needed while here.  May go home today. Please call with questions. I will arrange outpatient follow-up with [facility] team in the next few weeks.   Thompson Grayer MD, [facility] 06/29/2017 9:05 AM

## 2017-06-29 NOTE — Discharge Summary (Signed)
[facility]  Physician Discharge Summary   Patient ID: Alexa [name] MRN: [id number] DOB/AGE: Feb 08, 1976 42 y.o.  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 Pulmonology to arrange follow-up in the next few days and will schedule outpatient sleep study and try to arrange for CPAP or BiPAP for the patient. 2. Home oxygen has been ordered   DISCHARGE CONDITION: fair  Diet recommendation: Heart healthy modified carbohydrate  Filed Weights   06/27/17 0600 06/28/17 0521 06/29/17 0537  Weight: (!) 194 kg (427 lb 11.2 oz) (!) 189.2 kg (417 lb 1.8 oz) (!) 186.1 kg (410 lb 3.2 oz)    INITIAL HISTORY: 42 year old African-American female with a past medical history of morbid obesity, pulmonary embolism on chronic anticoagulation, obstructive sleep apnea not on treatment, recent hospitalization for acute congestive heart failure thought to be predominantly right-sided, pulmonary hypertension, diabetes mellitus type 2 who was discharged on March 23 after she spent a few days in the hospital being diuresed.  She presented to the hospital with complains of worsening shortness of breath, weight gain.  She was noted to be hypoxic.  She was noted to have volume overload.  She was hospitalized for further management.  Consultations:  Cardiology  Pulmonology   HOSPITAL COURSE:   Acute CHF likely diastolic versus right-sided/Acute hypoxic respiratory failure Patient was admitted to the  hospital.  She was placed on intravenous diuretics.  She slowly started improving rapidly.  She diuresed well.  She has lost more than 17 pounds.  Ambulating without much difficulty although she is noted to drop her saturations into the 80s.  She will need home oxygen.  Appreciate cardiology input.  They will arrange outpatient follow-up.  She will be discharged on a higher dose of furosemide.  History of unprovoked pulmonary embolism, on chronic anticoagulation/Pulmonary hypertension  Continue with warfarin.  Pulmonology was consulted and we appreciate their assistance.  Patient also followed by hematology as an outpatient.    Obstructive sleep apnea This was diagnosed in September 2018.  Her AHI was greater than 80 with multiple episodes of oxygen desaturations.  BiPAP was ordered at that time however patient found herself unable to make the co-pay and she never was able to get the BiPAP.  Same issue remains now.    Apparently there is a co-pay of $400 which patient is not able to afford.  Seen by pulmonology for this issue.  Discussed with Dr. Halford Chessman today.  He will arrange outpatient follow-up.  Patient will need repeat sleep study.  They will try to arrange for a CPAP or BiPAP as an outpatient.  Since patient otherwise is clinically improved she is considered okay for discharge home.  However without optimal treatment for the sleep apnea this will be a recurrent issue.     Diabetes mellitus type 2 in obese patient HbA1c was 5.7 in March.  Morbid obesity BMI greater than 70.  Will need to pursue weight loss strategies as outpatient.  Abdominal adenopathy This was incidentally detected  on a CT scan from last year.  Hematology had plan to do a repeat scan in March but it does not look like patient has had one.  This can be pursued as an outpatient.  Overall stable.  Discussed in detail with patient and her mother.  Also discussed with Dr. Rayann Heman and Dr. Halford Chessman.  Okay for discharge home  today.  PERTINENT LABS:  The results of significant diagnostics from this hospitalization (including imaging, microbiology, ancillary and laboratory) are listed below for reference.      Labs: Basic Metabolic Panel: Recent Labs  Lab 06/26/17 1520 06/27/17 0519 06/28/17 0503 06/29/17 0515  NA 140 141 138 137  K 4.3 4.4 4.1 4.7  CL 101 99* 95* 94*  CO2 33* 34* 34* 35*  GLUCOSE 83 101* 100* 102*  BUN 21* 15 14 16   CREATININE 1.03* 0.84 0.89 0.88  CALCIUM 8.7* 8.6* 8.9 8.6*   CBC: Recent Labs  Lab 06/26/17 1520 06/27/17 0519  WBC 8.0 7.3  NEUTROABS 5.3 5.0  HGB 13.3 13.5  HCT 44.8 47.5*  MCV 87.8 91.2  PLT 393 404*   BNP: BNP (last 3 results) Recent Labs    06/10/17 0946 06/26/17 1520  BNP 177.0* 136.0*    CBG: Recent Labs  Lab 06/28/17 1143 06/28/17 1645 06/28/17 2055 06/29/17 0718 06/29/17 1125  GLUCAP 100* 104* 96 97 101*     IMAGING STUDIES Dg Chest 2 View  Result Date: 06/26/2017 CLINICAL DATA:  10 pound weight gain since hospital discharge June 15, 2017. Extremity swelling. EXAM: CHEST - 2 VIEW COMPARISON:  Chest radiograph June 10, 2017 FINDINGS: Similar to worsening cardiomegaly. Pulmonary vascular congestion without pleural effusion or focal consolidation. No pneumothorax. Large body habitus. IMPRESSION: Similar to worsening cardiomegaly with pulmonary vascular congestion. Electronically Signed   By: Elon Alas M.D.   On: 06/26/2017 16:13   Dg Chest 2 View  Result Date: 06/10/2017 CLINICAL DATA:  Cough EXAM: CHEST - 2 VIEW COMPARISON:  05/18/2016 FINDINGS: Cardiac enlargement with vascular congestion. Negative for edema or effusion. Negative for pneumonia IMPRESSION: Cardiomegaly with vascular congestion.  Negative for edema Electronically Signed   By: Franchot Gallo M.D.   On: 06/10/2017 10:13    DISCHARGE EXAMINATION: Vitals:   06/28/17 2051 06/29/17 0537 06/29/17 0915 06/29/17 0943  BP: 132/76 117/66    Pulse: 79 76    Resp: 18      Temp: 98.5 F (36.9 C) 98.6 F (37 C)    TempSrc: Oral Oral    SpO2: 98% 96% 92% 93%  Weight:  (!) 186.1 kg (410 lb 3.2 oz)    Height:       General appearance: alert, cooperative, appears stated age and no distress.  Morbidly obese Resp: clear to auscultation bilaterally Cardio: regular rate and rhythm, S1, S2 normal, no murmur, click, rub or gallop GI: soft, non-tender; bowel sounds normal; no masses,  no organomegaly  DISPOSITION: Home  Discharge Instructions    (HEART FAILURE PATIENTS) Call MD:  Anytime you have any of the following symptoms: 1) 3 pound weight gain in 24 hours or 5 pounds in 1 week 2) shortness of breath, with or without a dry hacking cough 3) swelling in the hands, feet or stomach 4) if you have to sleep on extra pillows at night in order to breathe.   Complete by:  As directed    Call MD for:  difficulty breathing, headache or visual disturbances   Complete by:  As directed  Call MD for:  extreme fatigue   Complete by:  As directed    Call MD for:  persistant dizziness or light-headedness   Complete by:  As directed    Call MD for:  persistant nausea and vomiting   Complete by:  As directed    Call MD for:  severe uncontrolled pain   Complete by:  As directed    Call MD for:  temperature >100.4   Complete by:  As directed    Diet - low sodium heart healthy   Complete by:  As directed    Discharge instructions   Complete by:  As directed    Please be sure to follow-up with the Coumadin clinic in 3-4 days.  You should get a call from the office of the lung doctor (pulmonologist) within the next 2-3 days.  If you do not please call their office to schedule follow-up.  You will need to have another sleep study done which they will arrange.  Take your medications as prescribed.  Do not drink more than 1800-2000 ml/day of fluids.  Follow the heart failure discharge instructions.  You were cared for by a hospitalist during your hospital stay. If you have any  questions about your discharge medications or the care you received while you were in the hospital after you are discharged, you can call the unit and asked to speak with the hospitalist on call if the hospitalist that took care of you is not available. Once you are discharged, your primary care physician will handle any further medical issues. Please note that NO REFILLS for any discharge medications will be authorized once you are discharged, as it is imperative that you return to your primary care physician (or establish a relationship with a primary care physician if you do not have one) for your aftercare needs so that they can reassess your need for medications and monitor your lab values. If you do not have a primary care physician, you can call [phone] for a physician referral.   Increase activity slowly   Complete by:  As directed         Allergies as of 06/29/2017      Reactions   Penicillins Hives   Has patient had a PCN reaction causing immediate rash, facial/tongue/throat swelling, SOB or lightheadedness with hypotension: No Has patient had a PCN reaction causing severe rash involving mucus membranes or skin necrosis: No Has patient had a PCN reaction that required hospitalization No Has patient had a PCN reaction occurring within the last 10 years: No If all of the above answers are "NO", then may proceed with Cephalosporin use.      Medication List    STOP taking these medications   losartan 100 MG tablet Commonly known as:  COZAAR   nystatin powder Commonly known as:  MYCOSTATIN/NYSTOP     TAKE these medications   acetaminophen 500 MG tablet Commonly known as:  TYLENOL Take 1,000 mg by mouth daily as needed for mild pain.   cetirizine 10 MG tablet Commonly known as:  ZYRTEC Take 1 tablet (10 mg total) by mouth daily.   ferrous sulfate 325 (65 FE) MG tablet Take 325 mg by mouth 2 (two) times daily with a meal.   furosemide 40 MG tablet Commonly known as:   LASIX Take 1 tablet (40 mg total) by mouth 2 (two) times daily. What changed:  when to take this   gabapentin 300 MG capsule Commonly known as:  NEURONTIN Take  1 capsule (300 mg total) by mouth 2 (two) times daily.   glucose blood test strip ascensia  Meter/strips preferred Test daily Use as instructed Dx.dm   metFORMIN 500 MG tablet Commonly known as:  GLUCOPHAGE take 1 tablet by mouth twice a day with meals   onetouch ultrasoft lancets Test daily Use as instructed dm2   warfarin 7.5 MG tablet Commonly known as:  COUMADIN Take as directed. If you are unsure how to take this medication, talk to your nurse or doctor. Original instructions:  take 1 and 1/2 tablet to 2 tablets once daily as directed BY COUMADIN CLINIC            Durable Medical Equipment  (From admission, onward)        Start     Ordered   06/29/17 1054  For home use only DME oxygen  Once    Question Answer Comment  Mode or (Route) Nasal cannula   Liters per Minute 2   Frequency Continuous (stationary and portable oxygen unit needed)   Oxygen conserving device Yes   Oxygen delivery system Gas      06/29/17 1054   06/27/17 1300  For home use only DME Bipap  Once    Comments:  Auto set 20-24 ipap epap 16-19   Heated humidity Mask of choice  Question Answer Comment  Inspiratory pressure OTHER SEE COMMENTS   Expiratory pressure OTHER SEE COMMENTS      06/27/17 1300       Follow-up Information    [facility] Follow up.   Specialty:  Pulmonology Why:  Call within 1 week to set up otpt sleep study appt for Bipap Contact information: [place]       [facility] .   Specialty:  Pulmonology Contact information: [facility] up.   Why:  will deliver portable oxygen to room and oxygen concentrator to your  home Contact information: [place] [address] [phone]           TOTAL DISCHARGE TIME: 35 minutes  [facility] Pager [phone]  06/29/2017, 3:00 PM

## 2017-07-01 DIAGNOSIS — J9601 Acute respiratory failure with hypoxia: Secondary | ICD-10-CM | POA: Diagnosis not present

## 2017-07-01 DIAGNOSIS — J209 Acute bronchitis, unspecified: Secondary | ICD-10-CM | POA: Diagnosis not present

## 2017-07-01 DIAGNOSIS — I2699 Other pulmonary embolism without acute cor pulmonale: Secondary | ICD-10-CM | POA: Diagnosis not present

## 2017-07-01 NOTE — Telephone Encounter (Signed)
I was able to talk with patient and she is scheduled with Dr. Halford Chessman on July 03 2017 and 0915 for hospital follow up.   Nothing further is needed

## 2017-07-02 ENCOUNTER — Telehealth: Payer: Self-pay | Admitting: Physician Assistant

## 2017-07-02 NOTE — Telephone Encounter (Signed)
Copied from [place]. Topic: Quick Communication - See Telephone Encounter >> Jul 02, 2017  2:48 PM Aurelio Brash B wrote: CRM for notification. See Telephone encounter for: 07/02/17.  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

## 2017-07-02 NOTE — Telephone Encounter (Signed)
Copied from [place] [id number]. Topic: General - Other >> Jul 02, 2017  2:52 PM Aurelio Brash B wrote: Reason for CRM: Pt is requesting a handicap placard

## 2017-07-03 ENCOUNTER — Encounter: Payer: Self-pay | Admitting: Pulmonary Disease

## 2017-07-03 ENCOUNTER — Ambulatory Visit (INDEPENDENT_AMBULATORY_CARE_PROVIDER_SITE_OTHER): Payer: 59 | Admitting: Pulmonary Disease

## 2017-07-03 VITALS — BP 128/80 | HR 91 | Ht 64.0 in | Wt >= 6400 oz

## 2017-07-03 DIAGNOSIS — G4733 Obstructive sleep apnea (adult) (pediatric): Secondary | ICD-10-CM | POA: Diagnosis not present

## 2017-07-03 DIAGNOSIS — E662 Morbid (severe) obesity with alveolar hypoventilation: Secondary | ICD-10-CM | POA: Diagnosis not present

## 2017-07-03 DIAGNOSIS — I272 Pulmonary hypertension, unspecified: Secondary | ICD-10-CM | POA: Diagnosis not present

## 2017-07-03 DIAGNOSIS — Z6841 Body Mass Index (BMI) 40.0 and over, adult: Secondary | ICD-10-CM | POA: Diagnosis not present

## 2017-07-03 DIAGNOSIS — Z86711 Personal history of pulmonary embolism: Secondary | ICD-10-CM

## 2017-07-03 NOTE — Patient Instructions (Signed)
Will arrange for home sleep study   Follow up in 3 months 

## 2017-07-03 NOTE — Progress Notes (Signed)
[facility]  Chief Complaint  Patient presents with  . Hospitalization Follow-up    Hospital f/u reports she has been much better since hospital.     Vital signs: BP 128/80 (BP Location: Right Arm, Cuff Size: Normal)   Pulse 91   Ht 5\' 4"  (1.626 m)   Wt (!) 412 lb (186.9 kg)   SpO2 96%   BMI 70.72 kg/m   History of Present Illness: Alexa [name] is a 42 y.o. female with chronic respiratory failure and pulmonary hypertension in setting of OSA and OHS.  She also has hx of unprovoked PE.  She was set up with 2 liters oxygen after discharge from hospital.  She is not having cough, wheeze, sputum, or chest pain.  She is getting around at home better.  Her leg swelling is better.  She has history of PE and is followed in coumadin clinic.   Physical Exam:  General - pleasant, wearing oxygen Eyes - pupils reactive, wears glasses ENT - no sinus tenderness, no oral exudate, no LAN Cardiac - regular, no murmur Chest - no wheeze, rales Abd - soft, non tender Ext - 1+ non pitting edema of legs Skin - no rashes Neuro - normal strength Psych - normal mood  Assessment/Plan:  Chronic hypoxic/hypercapnic respiratory failure in setting of OSA and OHS. - will arrange for home sleep study to free her up to get PAP set up  - will probably arrange for auto CPAP first, and then determine if she needs in lab titration study  WHO group 3 pulmonary hypertension. - optimize respiratory management - will need f/u Echo at some point  Morbid obesity. - discussed importance of weight loss  Hx of pulmonary embolism - f/u with coumadin clinic  Chronic diastolic CHF. - advised her to schedule f/u with cardiology    Patient Instructions  Will arrange for home sleep study  Follow up in 3 months     Chesley Mires, MD [place] 07/03/2017, 9:25 AM  Flow Sheet  Pulmonary tests: CT angio chest 05/14/16 >> acute PE with RV:LV  ratio 1.26  Sleep tests: PSG 10/11/16 >> AHI 82, SpO2 low 50%  Cardiac tests: Echo 06/11/17 >> EF 70 to 75%, mod RV dilation, mod/severe TR, PAS 80 mmHg  Past Medical History: She  has a past medical history of Gestational diabetes, Hypertension, Obesity hypoventilation syndrome ([place]), Observed sleep apnea, and Pulmonary embolism ([place]) (05/14/2016).  Past Surgical History: She  has a past surgical history that includes Cesarean section.  Family History: Her family history includes Cancer in her maternal grandmother; Cancer (age of onset: 66) in her maternal aunt and maternal aunt; Diabetes in her maternal grandmother; Hypertension in her mother.  Social History: She  reports that she has quit smoking. Her smoking use included cigarettes. She has never used smokeless tobacco. She reports that she drinks about 2.4 - 3.6 oz of alcohol per week. She reports that she does not use drugs.  Medications: Allergies as of 07/03/2017      Reactions   Penicillins Hives   Has patient had a PCN reaction causing immediate rash, facial/tongue/throat swelling, SOB or lightheadedness with hypotension: No Has patient had a PCN reaction causing severe rash involving mucus membranes or skin necrosis: No Has patient had a PCN reaction that required hospitalization No Has patient had a PCN reaction occurring within the last 10 years: No If all of the above answers are "NO", then may proceed with  Cephalosporin use.      Medication List        Accurate as of 07/03/17  9:25 AM. Always use your most recent med list.          acetaminophen 500 MG tablet Commonly known as:  TYLENOL Take 1,000 mg by mouth daily as needed for mild pain.   cetirizine 10 MG tablet Commonly known as:  ZYRTEC Take 1 tablet (10 mg total) by mouth daily.   ferrous sulfate 325 (65 FE) MG tablet Take 325 mg by mouth 2 (two) times daily with a meal.   furosemide 40 MG tablet Commonly known as:  LASIX Take 1 tablet (40 mg  total) by mouth 2 (two) times daily.   gabapentin 300 MG capsule Commonly known as:  NEURONTIN Take 1 capsule (300 mg total) by mouth 2 (two) times daily.   glucose blood test strip ascensia  Meter/strips preferred Test daily Use as instructed Dx.dm   metFORMIN 500 MG tablet Commonly known as:  GLUCOPHAGE take 1 tablet by mouth twice a day with meals   onetouch ultrasoft lancets Test daily Use as instructed dm2   warfarin 7.5 MG tablet Commonly known as:  COUMADIN Take as directed by the anticoagulation clinic. If you are unsure how to take this medication, talk to your nurse or doctor. Original instructions:  take 1 and 1/2 tablet to 2 tablets once daily as directed BY COUMADIN CLINIC

## 2017-07-03 NOTE — Telephone Encounter (Signed)
Called to try and schedule pt for a hospital F/U. Left VM for pt to call the office and make an appt with someone other than [facility].  When pt calls back, please schedule her with someone of her choice for a F/U .  Thanks!

## 2017-07-03 NOTE — Telephone Encounter (Signed)
Phone call to patient. Left detailed message per signed authorization patient needs visit to follow in clinic from hospitalizations x2 in the last month. Handicap placard and oxygen can be addressed at that time.

## 2017-07-04 ENCOUNTER — Ambulatory Visit (INDEPENDENT_AMBULATORY_CARE_PROVIDER_SITE_OTHER): Payer: 59 | Admitting: Pharmacist

## 2017-07-04 DIAGNOSIS — Z7901 Long term (current) use of anticoagulants: Secondary | ICD-10-CM | POA: Diagnosis not present

## 2017-07-04 DIAGNOSIS — I2699 Other pulmonary embolism without acute cor pulmonale: Secondary | ICD-10-CM

## 2017-07-04 LAB — POCT INR: INR: 2.6

## 2017-07-09 ENCOUNTER — Telehealth: Payer: Self-pay | Admitting: Cardiovascular Disease

## 2017-07-09 NOTE — Telephone Encounter (Signed)
New message  Abby from [employer] verbalzied that she is calling for RN  To go over opportunities in her policy and benefits with her insurance   She was unsuccessful with reaching pt

## 2017-07-09 NOTE — Telephone Encounter (Signed)
Returned call to [facility] nurse. She received a referral for the patient for the heart health support program since patient has been recently discharged with heart failure. She has tried to reach the patient but has been unsuccessful. She wanted to relay the benefits of this free program that patient's insurance offers to that they may be discussed with patient by our office, if able.   Will route to T. Stover CMA as Juluis Rainier as patient has MD OV on 4/30

## 2017-07-14 ENCOUNTER — Other Ambulatory Visit: Payer: Self-pay | Admitting: Cardiology

## 2017-07-14 DIAGNOSIS — I2699 Other pulmonary embolism without acute cor pulmonale: Secondary | ICD-10-CM

## 2017-07-14 DIAGNOSIS — Z7901 Long term (current) use of anticoagulants: Secondary | ICD-10-CM

## 2017-07-20 ENCOUNTER — Ambulatory Visit: Payer: 59 | Admitting: Physician Assistant

## 2017-07-23 ENCOUNTER — Telehealth: Payer: Self-pay | Admitting: Pulmonary Disease

## 2017-07-23 ENCOUNTER — Ambulatory Visit: Payer: 59 | Admitting: Cardiovascular Disease

## 2017-07-23 NOTE — Telephone Encounter (Signed)
Dr. Halford Chessman, please advise on changing the order to an in-lab study. Thanks!

## 2017-07-24 NOTE — Telephone Encounter (Signed)
Who determined that she needs to have in lab study?  Was this insurance requirement.  I am not sure she needs oxygen at night, and want to do home sleep study to determine if she has sleep apnea and then do in lab titration study if she does have sleep apnea.    So if this isn't an insurance requirement, then please reschedule the home sleep study.

## 2017-07-24 NOTE — Telephone Encounter (Signed)
Yes, Dr. Halford Chessman stated he doesn't feel like she needs oxygen at night so she can do the test without.

## 2017-07-24 NOTE — Telephone Encounter (Signed)
PCC's see Dr. Halford Chessman response. Please advise.

## 2017-07-24 NOTE — Telephone Encounter (Signed)
Talked with VS, pt is okay to do the HST without O2 for the test Nothing further needed at this time

## 2017-07-24 NOTE — Telephone Encounter (Signed)
I need to know if she can come off her oxygen to do the study she stated she wears it all the time

## 2017-07-25 ENCOUNTER — Ambulatory Visit: Payer: 59 | Admitting: Physician Assistant

## 2017-07-29 ENCOUNTER — Ambulatory Visit: Payer: 59 | Admitting: Physician Assistant

## 2017-07-30 ENCOUNTER — Encounter: Payer: Self-pay | Admitting: Family Medicine

## 2017-07-30 ENCOUNTER — Other Ambulatory Visit: Payer: Self-pay

## 2017-07-30 ENCOUNTER — Telehealth: Payer: Self-pay | Admitting: Pulmonary Disease

## 2017-07-30 ENCOUNTER — Ambulatory Visit (INDEPENDENT_AMBULATORY_CARE_PROVIDER_SITE_OTHER): Payer: 59 | Admitting: Family Medicine

## 2017-07-30 VITALS — BP 124/82 | HR 99 | Temp 98.3°F | Ht 64.37 in | Wt >= 6400 oz

## 2017-07-30 DIAGNOSIS — Z23 Encounter for immunization: Secondary | ICD-10-CM | POA: Diagnosis not present

## 2017-07-30 DIAGNOSIS — E1149 Type 2 diabetes mellitus with other diabetic neurological complication: Secondary | ICD-10-CM

## 2017-07-30 DIAGNOSIS — IMO0001 Reserved for inherently not codable concepts without codable children: Secondary | ICD-10-CM

## 2017-07-30 DIAGNOSIS — Z6841 Body Mass Index (BMI) 40.0 and over, adult: Secondary | ICD-10-CM

## 2017-07-30 DIAGNOSIS — I2781 Cor pulmonale (chronic): Secondary | ICD-10-CM | POA: Diagnosis not present

## 2017-07-30 DIAGNOSIS — I10 Essential (primary) hypertension: Secondary | ICD-10-CM | POA: Diagnosis not present

## 2017-07-30 DIAGNOSIS — I272 Pulmonary hypertension, unspecified: Secondary | ICD-10-CM

## 2017-07-30 LAB — POCT GLYCOSYLATED HEMOGLOBIN (HGB A1C): Hemoglobin A1C: 6.1

## 2017-07-30 NOTE — Telephone Encounter (Signed)
Called and spoke with patient, she states that she has two POC that only last 6 hours a day. She is wanting to go back to work full time and is needing an order to have a 3rd POC so that she can do this.   VS please advise on this, thanks.

## 2017-07-30 NOTE — Telephone Encounter (Signed)
Spoke with pt. She would like to be qualified for POC. Pt has been scheduled for a walk test to qualify her for the simply go mini. Nothing further was needed at this time.

## 2017-07-30 NOTE — Patient Instructions (Addendum)
.   Referral  To eye doctor, podiatrist and weight loss physician, if you have not heard in 2 weeks call referral clerk.     IF you received an x-ray today, you will receive an invoice from [facility]. Please contact [facility] at [phone] with questions or concerns regarding your invoice.   IF you received labwork today, you will receive an invoice from [facility]. Please contact [facility] at [phone] with questions or concerns regarding your invoice.   Our billing staff will not be able to assist you with questions regarding bills from these companies.  You will be contacted with the lab results as soon as they are available. The fastest way to get your results is to activate your My Chart account. Instructions are located on the last page of this paperwork. If you have not heard from [place] regarding the results in 2 weeks, please contact this office.

## 2017-07-30 NOTE — Telephone Encounter (Signed)
Does she want extra tanks, or does she want a portable oxygen concentrator (POC)?

## 2017-07-30 NOTE — Progress Notes (Signed)
5/7/20199:24 AM  Alexa [name] 25-Mar-1976, 42 y.o. female [id number]  Chief Complaint  Patient presents with  . Establish Care    recent pt of Ivar Drape. Says she does not monitor glucose levels at home as she should.    HPI:   Patient is a 42 y.o. female with past medical history significant for DM2, CHF, OSA untreated, pHTN, on longer term anticoagulatoin who presents today to establish care.  Previous PCP, Ivar Drape, PA-C, has left [facility] Patient Care Team: Rutherford Guys, MD as PCP - General (Family Medicine) Lorretta Harp, MD as PCP - Cardiology (Cardiology) Chesley Mires, MD as Consulting Physician (Pulmonary Disease) Truitt Merle, MD as Consulting Physician (Hematology)  Discharged from [facility] 4/619 - Acute resipratory failure, CHF exacerbation - diastolic vs right sideded , OSA, pHTN Sent home on O2 as ambulating sats in the 80s, Dc weight 410 lbs, diuresed 17 lbs Echo 05/2017, LVEF 26-94%, normal diastolic function, RV volume and pressure overload, PA 80 mmHg On coumadin for PE, long term, managed by cards, INR 3 weeks ago at goal, 2.6 Previously hosp for CHF exacerbation on March 23 Unable to afford copay for bipap DM2, a1c in march 5.7  Overall she reports doing ok She continues to use oxygen continuously, feels less SOB She is following a fluid restriction of 2L Missed her feb ophtho appt  She does not check cbgs She is unable to exercise due to resp issues, uses a cane She has never worked with weight loss She would like a referral for podiatry, needs help with caring for feet, has neuropathic pain, gabapentin helps, sometimes causes mild hand shakes but she feels this is tolerable.  She quit smoking a month ago, prior used to smoke very socially, never a daily smoker She sees Pulm and Card this week She sees Dr Burr Medico, every 6 month, will be doing CT for surveillance of adenopathy  Fall Risk  07/30/2017 04/04/2017 04/04/2017 01/21/2017  12/18/2016  Falls in the past year? No No No No No     Depression screen [facility] 2/9 07/30/2017 04/04/2017 04/04/2017  Decreased Interest 0 0 0  Down, Depressed, Hopeless 0 0 0  PHQ - 2 Score 0 0 0    Allergies  Allergen Reactions  . Penicillins Hives    Has patient had a PCN reaction causing immediate rash, facial/tongue/throat swelling, SOB or lightheadedness with hypotension: No Has patient had a PCN reaction causing severe rash involving mucus membranes or skin necrosis: No Has patient had a PCN reaction that required hospitalization No Has patient had a PCN reaction occurring within the last 10 years: No If all of the above answers are "NO", then may proceed with Cephalosporin use.    Prior to Admission medications   Medication Sig Start Date End Date Taking? Authorizing Provider  acetaminophen (TYLENOL) 500 MG tablet Take 1,000 mg by mouth daily as needed for mild pain.   Yes [provider]  cetirizine (ZYRTEC) 10 MG tablet Take 1 tablet (10 mg total) by mouth daily. 12/11/16  Yes Ivar Drape D, PA  ferrous sulfate 325 (65 FE) MG tablet Take 325 mg by mouth 2 (two) times daily with a meal.    Yes [provider]  furosemide (LASIX) 40 MG tablet Take 1 tablet (40 mg total) by mouth 2 (two) times daily. 06/29/17  Yes Bonnielee Haff, MD  gabapentin (NEURONTIN) 300 MG capsule Take 1 capsule (300 mg total) by mouth 2 (two) times daily. 04/08/17  Yes Ivar Drape D, PA  metFORMIN (GLUCOPHAGE) 500 MG tablet take 1 tablet by mouth twice a day with meals 04/30/17  Yes Sagardia, Ines Bloomer, MD  warfarin (COUMADIN) 7.5 MG tablet TAKE 1.5- 2 TABLETS BY MOUTH DAILY 07/15/17  Yes Lorretta Harp, MD    Past Medical History:  Diagnosis Date  . CHF (congestive heart failure) ([place])   . Gestational diabetes   . Hypertension   . Obesity hypoventilation syndrome ([place])   . Observed sleep apnea   . Pulmonary embolism ([place]) 05/14/2016  . Pulmonary hypertension ([place])      Past Surgical History:  Procedure Laterality Date  . CESAREAN SECTION      Social History   Tobacco Use  . Smoking status: Former Smoker    Types: Cigarettes  . Smokeless tobacco: Never Used  . Tobacco comment: only smokes when she drinks alcohol - 1 pack per week  Substance Use Topics  . Alcohol use: Yes    Alcohol/week: 2.4 - 3.6 oz    Types: 4 - 6 Standard drinks or equivalent per week    Comment: mixed drinks 3-4 times/weeks    Family History  Problem Relation Age of Onset  . Diabetes Maternal Grandmother   . Cancer Maternal Grandmother        breast cancer   . Hypertension Mother   . Cancer Maternal Aunt 55       breast cancer   . Cancer Maternal Aunt 55       breast cancer     Review of Systems  Constitutional: Negative for chills and fever.  Respiratory: Positive for shortness of breath. Negative for cough.   Cardiovascular: Positive for leg swelling. Negative for chest pain and palpitations.  Gastrointestinal: Negative for abdominal pain, nausea and vomiting.  Neurological: Negative for dizziness and headaches.  Endo/Heme/Allergies: Negative for polydipsia.     OBJECTIVE:  Blood pressure 124/82, pulse 99, temperature 98.3 F (36.8 C), temperature source Oral, height 5' 4.37" (1.635 m), weight (!) 412 lb 9.6 oz (187.2 kg), last menstrual period 06/27/2017, SpO2 97 %.  Wt Readings from Last 3 Encounters:  07/30/17 (!) 412 lb 9.6 oz (187.2 kg)  07/03/17 (!) 412 lb (186.9 kg)  06/29/17 (!) 410 lb 3.2 oz (186.1 kg)    Physical Exam  Constitutional: She is oriented to person, place, and time. She appears well-developed and well-nourished.  HENT:  Head: Normocephalic and atraumatic.  Mouth/Throat: Oropharynx is clear and moist. No oropharyngeal exudate.  Eyes: Pupils are equal, round, and reactive to light. EOM are normal. No scleral icterus.  Neck: Neck supple.  Cardiovascular: Normal rate, regular rhythm and normal heart sounds. Exam reveals no  gallop and no friction rub.  No murmur heard. Pulmonary/Chest: Effort normal and breath sounds normal. She has no wheezes. She has no rhonchi. She has no rales.  Wearing oxygen  Musculoskeletal: She exhibits edema (+2 pitting edema).  Neurological: She is alert and oriented to person, place, and time. Gait (uses cane) abnormal.  Skin: Skin is warm and dry.  Psychiatric: She has a normal mood and affect.  Nursing note and vitals reviewed.  Diabetic Foot Exam - Simple   Simple Foot Form Visual Inspection See comments:  Yes Sensation Testing See comments:  Yes Pulse Check Comments Both feet are very dry, cracked skin with numbness in left foot    Results for orders placed or performed in visit on 07/30/17 (from the past 24 hour(s))  POCT glycosylated hemoglobin (Hb A1C)  Status: None   Collection Time: 07/30/17  9:22 AM  Result Value Ref Range   Hemoglobin A1C 6.1     ASSESSMENT and PLAN  1. 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 Discussed importance of diet. Referring to podiatry and optho. Pneumovax 23 given today.  - POCT glycosylated hemoglobin (Hb A1C) - Microalbumin/Creatinine Ratio, Urine - Basic Metabolic Panel - Ambulatory referral to podiatry - Ambulatory referral to opthalmology   2. Benign essential HTN At goal. Cont current regime.  3. Pulmonary hypertension (HCC) On oxygen 2L, doing better. Managed by pulm, has upcoming appt  4. COR (chronic cor pulmonale) ([place]) Managed by cards, has upcoming appt.   5. Body mass index (BMI) of 70 or greater in adult [facility]) Patient not interested in bariatric surgery. Referring her to medical weight management. - Amb Ref to Medical Weight Management  Other orders - Pneumococcal polysaccharide vaccine 23-valent greater than or equal to 2yo subcutaneous/IM  Return in about 3 months (around 10/30/2017).    Rutherford Guys, MD Primary Care at  [place], [place] [address] Ph.  [phone] Fax [phone]

## 2017-07-31 DIAGNOSIS — I2699 Other pulmonary embolism without acute cor pulmonale: Secondary | ICD-10-CM | POA: Diagnosis not present

## 2017-07-31 DIAGNOSIS — J209 Acute bronchitis, unspecified: Secondary | ICD-10-CM | POA: Diagnosis not present

## 2017-07-31 DIAGNOSIS — J9601 Acute respiratory failure with hypoxia: Secondary | ICD-10-CM | POA: Diagnosis not present

## 2017-07-31 LAB — MICROALBUMIN / CREATININE URINE RATIO
Creatinine, Urine: 187 mg/dL
Microalb/Creat Ratio: 4.5 mg/g creat (ref 0.0–30.0)
Microalbumin, Urine: 8.5 ug/mL

## 2017-07-31 LAB — BASIC METABOLIC PANEL
BUN/Creatinine Ratio: 11 (ref 9–23)
BUN: 9 mg/dL (ref 6–20)
CO2: 31 mmol/L — ABNORMAL HIGH (ref 20–29)
Calcium: 9.1 mg/dL (ref 8.7–10.2)
Chloride: 96 mmol/L (ref 96–106)
Creatinine, Ser: 0.81 mg/dL (ref 0.57–1.00)
GFR calc Af Amer: 106 mL/min/{1.73_m2} (ref >59)
GFR calc non Af Amer: 92 mL/min/{1.73_m2} (ref >59)
Glucose: 85 mg/dL (ref 65–99)
Potassium: 4.3 mmol/L (ref 3.5–5.2)
Sodium: 139 mmol/L (ref 134–144)

## 2017-08-01 ENCOUNTER — Ambulatory Visit: Payer: 59 | Admitting: *Deleted

## 2017-08-01 ENCOUNTER — Telehealth: Payer: Self-pay | Admitting: Pulmonary Disease

## 2017-08-01 DIAGNOSIS — I272 Pulmonary hypertension, unspecified: Secondary | ICD-10-CM

## 2017-08-01 NOTE — Telephone Encounter (Signed)
I called Corene Cornea but there was no answer but left detailed message explaining to him that I would fix the order. Order placed. Nothing further is needed.

## 2017-08-01 NOTE — Progress Notes (Signed)
Patient qualified for POC today Parke Poisson, [facility] 08/01/17

## 2017-08-01 NOTE — Telephone Encounter (Signed)
lmtcb x 1 for Jason with  [facility] 

## 2017-08-01 NOTE — Telephone Encounter (Signed)
Corene Cornea returned call, [phone] x [phone]

## 2017-08-02 ENCOUNTER — Ambulatory Visit (INDEPENDENT_AMBULATORY_CARE_PROVIDER_SITE_OTHER): Payer: 59 | Admitting: Pharmacist

## 2017-08-02 ENCOUNTER — Encounter: Payer: Self-pay | Admitting: Cardiovascular Disease

## 2017-08-02 ENCOUNTER — Ambulatory Visit (INDEPENDENT_AMBULATORY_CARE_PROVIDER_SITE_OTHER): Payer: 59 | Admitting: Cardiovascular Disease

## 2017-08-02 DIAGNOSIS — I50813 Acute on chronic right heart failure: Secondary | ICD-10-CM | POA: Diagnosis not present

## 2017-08-02 DIAGNOSIS — G4733 Obstructive sleep apnea (adult) (pediatric): Secondary | ICD-10-CM | POA: Diagnosis not present

## 2017-08-02 DIAGNOSIS — Z7901 Long term (current) use of anticoagulants: Secondary | ICD-10-CM

## 2017-08-02 DIAGNOSIS — I2699 Other pulmonary embolism without acute cor pulmonale: Secondary | ICD-10-CM | POA: Diagnosis not present

## 2017-08-02 DIAGNOSIS — I10 Essential (primary) hypertension: Secondary | ICD-10-CM

## 2017-08-02 LAB — POCT INR: INR: 2.3

## 2017-08-02 NOTE — Assessment & Plan Note (Signed)
History of submassive acute pulmonary embolus February 2008 on Coumadin anticoagulation.

## 2017-08-02 NOTE — Assessment & Plan Note (Signed)
History of essential hypertension on losartan in the past currently not on antihypertensive medications with blood pressure measured in the office of 135/79.

## 2017-08-02 NOTE — Progress Notes (Signed)
08/02/2017 Garvin Fila Lennie Muckle   1975-05-06  [id number]  Primary Physician Rutherford Guys, MD Primary Cardiologist: Lorretta Harp MD Lupe Carney, [place]  HPI:  Alexa [name] is a 42 y.o. morbidly overweight divorced African-American female mother of 2 young children who works in [employer].  She was recently hospitalized for acute on chronic right-sided heart failure with volume overload and was diuresed.  She did have a history of submassive pulmonary embolus February 2018 currently on Coumadin anticoagulation.  Other problems include diabetes and hypertension.  She walks with a cane.  Her edema is about at baseline.   Current Meds  Medication Sig  . acetaminophen (TYLENOL) 500 MG tablet Take 1,000 mg by mouth daily as needed for mild pain.  . cetirizine (ZYRTEC) 10 MG tablet Take 1 tablet (10 mg total) by mouth daily.  . ferrous sulfate 325 (65 FE) MG tablet Take 325 mg by mouth 2 (two) times daily with a meal.   . furosemide (LASIX) 40 MG tablet Take 1 tablet (40 mg total) by mouth 2 (two) times daily.  Marland Kitchen gabapentin (NEURONTIN) 300 MG capsule Take 1 capsule (300 mg total) by mouth 2 (two) times daily.  Marland Kitchen glucose blood test strip ascensia  Meter/strips preferred Test daily Use as instructed Dx.dm  . Lancets (ONETOUCH ULTRASOFT) lancets Test daily Use as instructed dm2  . metFORMIN (GLUCOPHAGE) 500 MG tablet take 1 tablet by mouth twice a day with meals  . warfarin (COUMADIN) 7.5 MG tablet TAKE 1.5- 2 TABLETS BY MOUTH DAILY   Current Facility-Administered Medications for the 08/02/17 encounter (Office Visit) with Lorretta Harp, MD  Medication  . cyanocobalamin ((VITAMIN B-12)) injection 1,000 mcg     Allergies  Allergen Reactions  . Penicillins Hives    Has patient had a PCN reaction causing immediate rash, facial/tongue/throat swelling, SOB or lightheadedness with hypotension: No Has patient had a PCN reaction causing severe rash involving mucus membranes or skin  necrosis: No Has patient had a PCN reaction that required hospitalization No Has patient had a PCN reaction occurring within the last 10 years: No If all of the above answers are "NO", then may proceed with Cephalosporin use.    Social History   Socioeconomic History  . Marital status: Divorced    Spouse name: n/a  . Number of children: 0  . Years of education: Not on file  . Highest education level: Not on file  Occupational History  . 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 Financial resource strain: Not on file  . Food insecurity:    Worry: Not on file    Inability: Not on file  . Transportation needs:    Medical: Not on file    Non-medical: Not on file  Tobacco Use  . Smoking status: Former Smoker    Types: Cigarettes  . Smokeless tobacco: Never Used  . Tobacco comment: only smokes when she drinks alcohol - 1 pack per week  Substance and Sexual Activity  . Alcohol use: Yes    Alcohol/week: 2.4 - 3.6 oz    Types: 4 - 6 Standard drinks or equivalent per week    Comment: mixed drinks 3-4 times/weeks  . Drug use: No  . Sexual activity: Not Currently  Lifestyle  . Physical activity:    Days per week: Not on file    Minutes per session: Not on file  . Stress: Not on file  Relationships  .  Social connections:    Talks on phone: Not on file    Gets together: Not on file    Attends religious service: Not on file    Active member of club or organization: Not on file    Attends meetings of clubs or organizations: Not on file    Relationship status: Not on file  . Intimate partner violence:    Fear of current or ex partner: Not on file    Emotionally abused: Not on file    Physically abused: Not on file    Forced sexual activity: Not on file  Other Topics Concern  . Not on file  Social History Narrative   Her children live with her.   Ex-husband lives locally.     Review of Systems: General: negative for chills, fever, night  sweats or weight changes.  Cardiovascular: negative for chest pain, dyspnea on exertion, edema, orthopnea, palpitations, paroxysmal nocturnal dyspnea or shortness of breath Dermatological: negative for rash Respiratory: negative for cough or wheezing Urologic: negative for hematuria Abdominal: negative for nausea, vomiting, diarrhea, bright red blood per rectum, melena, or hematemesis Neurologic: negative for visual changes, syncope, or dizziness All other systems reviewed and are otherwise negative except as noted above.    Blood pressure 135/79, pulse (!) 101, height 5\' 4"  (1.626 m), weight (!) 419 lb (190.1 kg).  General appearance: alert and no distress Neck: no adenopathy, no carotid bruit, no JVD, supple, symmetrical, trachea midline and thyroid not enlarged, symmetric, no tenderness/mass/nodules Lungs: clear to auscultation bilaterally Heart: regular rate and rhythm, S1, S2 normal, no murmur, click, rub or gallop Extremities: 3+ bilateral lower extremity edema Pulses: 2+ and symmetric Skin: Venous stasis changes bilaterally Neurologic: Alert and oriented X 3, normal strength and tone. Normal symmetric reflexes. Normal coordination and gait  EKG not performed today  ASSESSMENT AND PLAN:   OSA (obstructive sleep apnea) History of of obstructive sleep apnea having recently undergone home sleep study the results of which are still pending  Benign essential HTN History of essential hypertension on losartan in the past currently not on antihypertensive medications with blood pressure measured in the office of 135/79.  Class 3 obesity due to excess calories with serious comorbidity and body mass index (BMI) greater than or equal to 70 in adult History of morbid obesity with complications of that trying to lose weight we did discuss the possibility of bariatric surgery which she currently is not interested in  Acute pulmonary embolus (HCC) History of submassive acute pulmonary  embolus February 2008 on Coumadin anticoagulation.  Acute on chronic right-sided heart failure (HCC) Acute on chronic right-sided heart failure with cor pulmonale pulmonary pressures in the 80 range probably from a combination of obesity and prior pulmonary embolus.  She is on oral diuretics.      Lorretta Harp MD FACP,FACC,FAHA, [facility] 08/02/2017 10:24 AM

## 2017-08-02 NOTE — Assessment & Plan Note (Signed)
Acute on chronic right-sided heart failure with cor pulmonale pulmonary pressures in the 80 range probably from a combination of obesity and prior pulmonary embolus.  She is on oral diuretics.

## 2017-08-02 NOTE — Assessment & Plan Note (Signed)
History of of obstructive sleep apnea having recently undergone home sleep study the results of which are still pending

## 2017-08-02 NOTE — Patient Instructions (Signed)
Medication Instructions: Your physician recommends that you continue on your current medications as directed. Please refer to the Current Medication list given to you today.   Follow-Up: We request that you follow-up in: 6 months with Luke Kilroy, PA and in 12 months with Dr Berry  You will receive a reminder letter in the mail two months in advance. If you don't receive a letter, please call our office to schedule the follow-up appointment.  If you need a refill on your cardiac medications before your next appointment, please call your pharmacy.  

## 2017-08-02 NOTE — Assessment & Plan Note (Signed)
History of morbid obesity with complications of that trying to lose weight we did discuss the possibility of bariatric surgery which she currently is not interested in

## 2017-08-05 ENCOUNTER — Encounter: Payer: Self-pay | Admitting: Family Medicine

## 2017-08-05 DIAGNOSIS — J9601 Acute respiratory failure with hypoxia: Secondary | ICD-10-CM | POA: Diagnosis not present

## 2017-08-05 DIAGNOSIS — I2699 Other pulmonary embolism without acute cor pulmonale: Secondary | ICD-10-CM | POA: Diagnosis not present

## 2017-08-05 DIAGNOSIS — I272 Pulmonary hypertension, unspecified: Secondary | ICD-10-CM | POA: Diagnosis not present

## 2017-08-06 ENCOUNTER — Other Ambulatory Visit: Payer: Self-pay | Admitting: *Deleted

## 2017-08-06 ENCOUNTER — Encounter: Payer: Self-pay | Admitting: Pulmonary Disease

## 2017-08-06 DIAGNOSIS — G4733 Obstructive sleep apnea (adult) (pediatric): Secondary | ICD-10-CM

## 2017-08-06 NOTE — Telephone Encounter (Signed)
Please inform that patient of this.  If she is agreeable to get tanks instead of POC, then please place order.

## 2017-08-06 NOTE — Telephone Encounter (Signed)
Called and spoke with Alexa [name], she states that she received the simply go mini on 5.13.19. She was unaware that she would only be getting one with one battery. She states that since it only lasts 3 hours before needing to be charged again this will not work for her due to the job that she has. She cannot sit and let the device charge while she works with her kids.   Called and spoke with Corene Cornea from [facility] and he stated that they cannot provide a second battery for her. They also cannot provide tanks along with the simply go mini as an option. Corene Cornea stated that since the patient works an 8 hour day it would be more beneficial for her to be placed back on the ml 6 tanks and they can provide her with a few of those to fill up and carry while at work.   VS please advise on this, thank you.

## 2017-08-12 ENCOUNTER — Telehealth: Payer: Self-pay | Admitting: Family Medicine

## 2017-08-12 ENCOUNTER — Telehealth: Payer: Self-pay | Admitting: Pulmonary Disease

## 2017-08-12 DIAGNOSIS — G4733 Obstructive sleep apnea (adult) (pediatric): Secondary | ICD-10-CM | POA: Diagnosis not present

## 2017-08-12 DIAGNOSIS — R609 Edema, unspecified: Secondary | ICD-10-CM

## 2017-08-12 NOTE — Telephone Encounter (Signed)
HST 08/02/17 >> AHI 24.1, SaO2 low 52%.  Spent 167.3 min with SpO2 < 89%.   Will have my nurse inform pt that sleep study shows moderate sleep apnea with low oxygen at night.  Options are 1) CPAP now, 2) ROV first.  If She is agreeable to CPAP, then please send order for auto CPAP range 5 to 15 cm H2O with heated humidity and mask of choice.  Have download sent 1 month after starting CPAP and set up ROV 2 months after starting CPAP.  ROV can be with me or NP.  Please arrange for ONO with CPAP also.

## 2017-08-12 NOTE — Telephone Encounter (Signed)
Left voice mail on machine for patient to return phone call back regarding results. X1  

## 2017-08-12 NOTE — Telephone Encounter (Signed)
Copied from [place] [phone]. Topic: Inquiry >> Aug 12, 2017  1:44 PM [name] wrote: Reason for CRM: Patient called stating that she needs a refill of Furosemide (LASIX) 40 MG tablet. Patient needs to take two per day. Patient's preferred pharmacy is [facility] [phone] [place], [place] - [address] AT [phone] (Phone)  [phone] (Fax).       Thank You!!!

## 2017-08-13 NOTE — Telephone Encounter (Signed)
Left voice mail on machine for patient to return phone call back regarding results. X2 

## 2017-08-13 NOTE — Telephone Encounter (Signed)
Refill for Lasix, last refilled on 06/29/17 by Dr. Bonnielee Haff  Dr. Caffie Pinto [phone] Last K+  4.3  LOV  07/30/16 NOV  10/31/17

## 2017-08-13 NOTE — Telephone Encounter (Signed)
Patient was prescribed on 06/2017 by Mardella Layman ED visit 06/26/2017 gave her 1 refill.      LOV to establish care 07/30/2017  Please advise

## 2017-08-14 ENCOUNTER — Telehealth: Payer: Self-pay | Admitting: Pulmonary Disease

## 2017-08-14 DIAGNOSIS — G4733 Obstructive sleep apnea (adult) (pediatric): Secondary | ICD-10-CM

## 2017-08-14 MED ORDER — FUROSEMIDE 40 MG PO TABS: 40.0000 mg | ORAL_TABLET | Freq: Two times a day (BID) | ORAL | 2 refills | Status: DC

## 2017-08-14 NOTE — Telephone Encounter (Signed)
refilled 

## 2017-08-14 NOTE — Addendum Note (Signed)
Addended by: Rutherford Guys on: 08/14/2017 11:02 AM   Modules accepted: Orders

## 2017-08-14 NOTE — Telephone Encounter (Signed)
LMTCB 08/14/17  HST 08/02/17 >> AHI 24.1, SaO2 low 52%.  Spent 167.3 min with SpO2 < 89%.   Will have my nurse inform pt that sleep study shows moderate sleep apnea with low oxygen at night.  Options are 1) CPAP now, 2) ROV first.  If She is agreeable to CPAP, then please send order for auto CPAP range 5 to 15 cm H2O with heated humidity and mask of choice.  Have download sent 1 month after starting CPAP and set up ROV 2 months after starting CPAP.  ROV can be with me or NP.  Please arrange for ONO with CPAP also.

## 2017-08-14 NOTE — Telephone Encounter (Signed)
Left voice mail on machine for patient to return phone call back regarding results. X3 Per protocol; tried contacting patient three times by phone, no answer or call back. Mailed communication letter today for pt to return call regarding results. Nothing further needed at this time.

## 2017-08-15 ENCOUNTER — Encounter: Payer: Self-pay | Admitting: Family Medicine

## 2017-08-16 NOTE — Telephone Encounter (Signed)
lmtcb for pt.  

## 2017-08-16 NOTE — Telephone Encounter (Signed)
Called and spoke to patient. Relayed results per VS. Patient wanted to go ahead and order CPAP. DME is [facility]. Placed order for CPAP and ONO when on CPAP. Also set up follow up ROV for July 24th with VS. Nothing further is needed at this time.

## 2017-08-16 NOTE — Telephone Encounter (Signed)
Pt returning call. Cb is [phone].

## 2017-08-22 ENCOUNTER — Encounter (INDEPENDENT_AMBULATORY_CARE_PROVIDER_SITE_OTHER): Payer: 59

## 2017-08-31 DIAGNOSIS — I2699 Other pulmonary embolism without acute cor pulmonale: Secondary | ICD-10-CM | POA: Diagnosis not present

## 2017-08-31 DIAGNOSIS — J209 Acute bronchitis, unspecified: Secondary | ICD-10-CM | POA: Diagnosis not present

## 2017-08-31 DIAGNOSIS — J9601 Acute respiratory failure with hypoxia: Secondary | ICD-10-CM | POA: Diagnosis not present

## 2017-09-04 ENCOUNTER — Ambulatory Visit (INDEPENDENT_AMBULATORY_CARE_PROVIDER_SITE_OTHER): Payer: 59 | Admitting: Family Medicine

## 2017-09-05 DIAGNOSIS — I2699 Other pulmonary embolism without acute cor pulmonale: Secondary | ICD-10-CM | POA: Diagnosis not present

## 2017-09-05 DIAGNOSIS — J9601 Acute respiratory failure with hypoxia: Secondary | ICD-10-CM | POA: Diagnosis not present

## 2017-09-05 DIAGNOSIS — I272 Pulmonary hypertension, unspecified: Secondary | ICD-10-CM | POA: Diagnosis not present

## 2017-09-16 ENCOUNTER — Other Ambulatory Visit: Payer: Self-pay | Admitting: Family Medicine

## 2017-09-16 NOTE — Telephone Encounter (Signed)
Copied from [place] [phone]. Topic: Quick Communication - Rx Refill/Question >> Sep 16, 2017  2:00 PM Celedonio Savage L wrote: Medication: gabapentin (NEURONTIN) 300 MG capsule  Has the patient contacted their pharmacy? Yes.  Faxed over request on Wed or Thursday of last week (Agent: If no, request that the patient contact the pharmacy for the refill.) (Agent: If yes, when and what did the pharmacy advise?)  Preferred Pharmacy (with phone number or street name): [facility] [phone] - [place], [address] AT [address] [place] [phone] (Phone) [phone] (Fax)      Agent: Please be advised that RX refills may take up to 3 business days. We ask that you follow-up with your pharmacy.

## 2017-09-17 NOTE — Telephone Encounter (Signed)
Gabapentin refill Last Refill:04/08/17 #180 by Richarda Overlie Last OV: 07/30/17 PCP: Dr. Pamella Pert Pharmacy:[facility]  [place]

## 2017-09-24 ENCOUNTER — Telehealth: Payer: Self-pay | Admitting: Family Medicine

## 2017-09-24 NOTE — Telephone Encounter (Unsigned)
Copied from [place] [phone]. Topic: Quick Communication - Rx Refill/Question >> Sep 24, 2017  2:17 PM Judyann Munson wrote: Medication: gabapentin (NEURONTIN) 300 MG capsule  Has the patient contacted their pharmacy? Yes. Faxed over request on Wed or Thursday of last week (Agent: If no, request that the patient contact the pharmacy for the refill.) (Agent: If yes, when and what did the pharmacy advise?)  Preferred Pharmacy (with phone number or street name): [facility] [phone] - [place], [address] AT [address] [place] [phone] (Phone) [phone] (Fax)

## 2017-09-25 ENCOUNTER — Other Ambulatory Visit: Payer: Self-pay | Admitting: *Deleted

## 2017-09-25 MED ORDER — GABAPENTIN 300 MG PO CAPS: 300.0000 mg | ORAL_CAPSULE | Freq: Two times a day (BID) | ORAL | 0 refills | Status: DC

## 2017-10-05 DIAGNOSIS — I272 Pulmonary hypertension, unspecified: Secondary | ICD-10-CM | POA: Diagnosis not present

## 2017-10-05 DIAGNOSIS — I2699 Other pulmonary embolism without acute cor pulmonale: Secondary | ICD-10-CM | POA: Diagnosis not present

## 2017-10-05 DIAGNOSIS — J9601 Acute respiratory failure with hypoxia: Secondary | ICD-10-CM | POA: Diagnosis not present

## 2017-10-16 ENCOUNTER — Ambulatory Visit: Payer: 59 | Admitting: Pulmonary Disease

## 2017-10-25 ENCOUNTER — Telehealth: Payer: Self-pay | Admitting: Family Medicine

## 2017-10-25 NOTE — Telephone Encounter (Signed)
Left VM advising pt that their appt with [place] on 10/31/17 will be at the [address] building.

## 2017-10-28 ENCOUNTER — Other Ambulatory Visit: Payer: Self-pay | Admitting: Cardiovascular Disease

## 2017-10-28 DIAGNOSIS — Z7901 Long term (current) use of anticoagulants: Secondary | ICD-10-CM

## 2017-10-28 DIAGNOSIS — I2699 Other pulmonary embolism without acute cor pulmonale: Secondary | ICD-10-CM

## 2017-10-29 NOTE — Telephone Encounter (Signed)
Patient to call and schedule f/u visit

## 2017-10-31 ENCOUNTER — Ambulatory Visit: Payer: 59 | Admitting: Family Medicine

## 2017-11-05 ENCOUNTER — Other Ambulatory Visit: Payer: Self-pay | Admitting: Cardiovascular Disease

## 2017-11-05 DIAGNOSIS — J9601 Acute respiratory failure with hypoxia: Secondary | ICD-10-CM | POA: Diagnosis not present

## 2017-11-05 DIAGNOSIS — Z7901 Long term (current) use of anticoagulants: Secondary | ICD-10-CM

## 2017-11-05 DIAGNOSIS — I2699 Other pulmonary embolism without acute cor pulmonale: Secondary | ICD-10-CM

## 2017-11-05 DIAGNOSIS — I272 Pulmonary hypertension, unspecified: Secondary | ICD-10-CM | POA: Diagnosis not present

## 2017-11-05 NOTE — Telephone Encounter (Signed)
°*  STAT* If patient is at the pharmacy, call can be transferred to refill team.   1. Which medications need to be refilled? (please list name of each medication and dose if known) Warfarin  2. Which pharmacy/location (including street and city if local pharmacy) is medication to be sent to?[facility] [phone]  3. Do they need a 30 day or 90 day supply? 30 and refills

## 2017-11-05 NOTE — Telephone Encounter (Signed)
LMOM; 2nd message.   Please call and schedule follow up prior to refill authorization.

## 2017-11-13 ENCOUNTER — Encounter: Payer: Self-pay | Admitting: Family Medicine

## 2017-11-13 ENCOUNTER — Ambulatory Visit: Payer: 59 | Admitting: Pulmonary Disease

## 2017-11-13 ENCOUNTER — Other Ambulatory Visit: Payer: Self-pay

## 2017-11-13 ENCOUNTER — Ambulatory Visit: Payer: 59 | Admitting: Family Medicine

## 2017-11-13 ENCOUNTER — Ambulatory Visit (INDEPENDENT_AMBULATORY_CARE_PROVIDER_SITE_OTHER): Payer: 59 | Admitting: Nurse Practitioner

## 2017-11-13 ENCOUNTER — Encounter: Payer: Self-pay | Admitting: Nurse Practitioner

## 2017-11-13 VITALS — BP 117/81 | HR 75 | Temp 98.4°F | Resp 17 | Ht 64.0 in | Wt >= 6400 oz

## 2017-11-13 VITALS — BP 136/78 | HR 106 | Ht 64.0 in | Wt >= 6400 oz

## 2017-11-13 DIAGNOSIS — I272 Pulmonary hypertension, unspecified: Secondary | ICD-10-CM

## 2017-11-13 DIAGNOSIS — N926 Irregular menstruation, unspecified: Secondary | ICD-10-CM | POA: Diagnosis not present

## 2017-11-13 DIAGNOSIS — G4733 Obstructive sleep apnea (adult) (pediatric): Secondary | ICD-10-CM

## 2017-11-13 DIAGNOSIS — I10 Essential (primary) hypertension: Secondary | ICD-10-CM

## 2017-11-13 DIAGNOSIS — E118 Type 2 diabetes mellitus with unspecified complications: Secondary | ICD-10-CM

## 2017-11-13 DIAGNOSIS — R0902 Hypoxemia: Secondary | ICD-10-CM | POA: Diagnosis not present

## 2017-11-13 DIAGNOSIS — Z803 Family history of malignant neoplasm of breast: Secondary | ICD-10-CM | POA: Diagnosis not present

## 2017-11-13 MED ORDER — GABAPENTIN 300 MG PO CAPS: 300.0000 mg | ORAL_CAPSULE | Freq: Two times a day (BID) | ORAL | 0 refills | Status: DC

## 2017-11-13 MED ORDER — MISC. DEVICES MISC: 0 refills | Status: DC

## 2017-11-13 MED ORDER — MISC. DEVICES MISC: 0 refills | Status: AC

## 2017-11-13 NOTE — Assessment & Plan Note (Signed)
WHO group 3 pulmonary hypertension. - optimize respiratory management - will need f/u Echo at some point

## 2017-11-13 NOTE — Patient Instructions (Addendum)
  Check sugars once a day. Alternate fasting and 2 hours after dinner   If you have lab work done today you will be contacted with your lab results within the next 2 weeks.  If you have not heard from [place] then please contact us. The fastest way to get your results is to register for My Chart.   IF you received an x-ray today, you will receive an invoice from [facility]. Please contact [facility] at [phone] with questions or concerns regarding your invoice.   IF you received labwork today, you will receive an invoice from [place]. Please contact [facility] at [phone] with questions or concerns regarding your invoice.   Our billing staff will not be able to assist you with questions regarding bills from these companies.  You will be contacted with the lab results as soon as they are available. The fastest way to get your results is to activate your My Chart account. Instructions are located on the last page of this paperwork. If you have not heard from [place] regarding the results in 2 weeks, please contact this office.

## 2017-11-13 NOTE — Progress Notes (Signed)
8/21/201911:31 AM  Alexa [name] 1976/02/19, 42 y.o. female [id number]  Chief Complaint  Patient presents with  . Diabetes    3 month follow up, fam hx of breast cancer (maternal aunt 46rd youngest sibling) died 11 weeks ago, pt concerned about breast cancer  . chair for shower    and handicap placards    HPI:   Patient is a 42 y.o. female with past medical history significant for DM2 with neuropathy, morbid obesity, HTN,  pHTN, untreated OSA, on coumadin for h/o submassive PE, who presents today for concerns of fhx breast cancer and routine followup  Maternal aunt died at 60yo of breast cancer 3 weeks ago Another maternal aunt survivor of breast cancer, currently 40 yo Maternal grandmother's sister also a survivor of breast cancer, in her 60 Unsure of genetic testing done in any of these family members  Irregular periods, gets them about every 3 months or so, dark, heavy for 2 days, lasts about a week Does not have gyn Has had Mirena IUD in the past but when she went in for 5 year reinsertion, the replacement fell out in about 2 weeks H/o PE  Overall feeling she is getting stronger Trying to exercise daily, being more active Still sees cards for INR check Not checking sugars at home  Still not on cpap due to insurance coverage Still using oxygen every night, 2L BLE edema mostly when she has been standing for long periods of time Edema does resolve with elevation Breathing in general good, gets SOB at less than a block, but slowly getting better  Needs shower chairs as it is really hard for her to get in and out of tub safely. Has had no falls Also requesting handicap placard  Lab Results  Component Value Date   HGBA1C 6.1 07/30/2017    Fall Risk  11/13/2017 07/30/2017 04/04/2017 04/04/2017 01/21/2017  Falls in the past year? No No No No No     Depression screen [facility] 2/9 11/13/2017 07/30/2017 04/04/2017  Decreased Interest 1 0 0  Down, Depressed, Hopeless 1 0 0  PHQ -  2 Score 2 0 0  Altered sleeping 0 - -  Tired, decreased energy 0 - -  Change in appetite 0 - -  Feeling bad or failure about yourself  1 - -  Trouble concentrating 0 - -  Moving slowly or fidgety/restless 0 - -  Suicidal thoughts 0 - -  PHQ-9 Score 3 - -  Difficult doing work/chores Not difficult at all - -    Allergies  Allergen Reactions  . Penicillins Hives    Has patient had a PCN reaction causing immediate rash, facial/tongue/throat swelling, SOB or lightheadedness with hypotension: No Has patient had a PCN reaction causing severe rash involving mucus membranes or skin necrosis: No Has patient had a PCN reaction that required hospitalization No Has patient had a PCN reaction occurring within the last 10 years: No If all of the above answers are "NO", then may proceed with Cephalosporin use.    Prior to Admission medications   Medication Sig Start Date End Date Taking? Authorizing Provider  acetaminophen (TYLENOL) 500 MG tablet Take 1,000 mg by mouth daily as needed for mild pain.   Yes [provider]  cetirizine (ZYRTEC) 10 MG tablet Take 1 tablet (10 mg total) by mouth daily. 12/11/16  Yes Ivar Drape D, PA  ferrous sulfate 325 (65 FE) MG tablet Take 325 mg by mouth 2 (two) times daily with a  meal.    Yes [provider]  furosemide (LASIX) 40 MG tablet Take 1 tablet (40 mg total) by mouth 2 (two) times daily. 08/14/17  Yes Rutherford Guys, MD  gabapentin (NEURONTIN) 300 MG capsule Take 1 capsule (300 mg total) by mouth 2 (two) times daily. 09/25/17  Yes Delia Chimes A, MD  glucose blood test strip ascensia  Meter/strips preferred Test daily Use as instructed Dx.dm 06/23/16  Yes English, Colletta Maryland D, PA  Lancets [facility] ULTRASOFT) lancets Test daily Use as instructed dm2 06/22/16  Yes English, [name] D, PA  metFORMIN (GLUCOPHAGE) 500 MG tablet take 1 tablet by mouth twice a day with meals 04/30/17  Yes Sagardia, Ines Bloomer, MD  warfarin (COUMADIN) 7.5  MG tablet TAKE 1 AND 1/2 TO 2 TABLETS BY MOUTH DAILY. NEED INR FOLLOW UP PRIOR TO ADDITIONAL REFILL AUTHORIZATION. 11/05/17  Yes Lorretta Harp, MD    Past Medical History:  Diagnosis Date  . CHF (congestive heart failure) ([place])   . DM2 (diabetes mellitus, type 2) ([place])   . Hypertension   . Nocturnal hypoxia   . Obstructive sleep apnea syndrome, moderate   . Pulmonary embolism ([place]) 05/14/2016   on chronic anticoagulation  . Pulmonary hypertension ([place])     Past Surgical History:  Procedure Laterality Date  . CESAREAN SECTION      Social History   Tobacco Use  . Smoking status: Former Smoker    Types: Cigarettes  . Smokeless tobacco: Never Used  . Tobacco comment: only smokes when she drinks alcohol - 1 pack per week  Substance Use Topics  . Alcohol use: Yes    Alcohol/week: 4.0 - 6.0 standard drinks    Types: 4 - 6 Standard drinks or equivalent per week    Comment: mixed drinks 3-4 times/weeks    Family History  Problem Relation Age of Onset  . Diabetes Maternal Grandmother   . Cancer Maternal Grandmother        breast cancer   . Hypertension Mother   . Cancer Maternal Aunt 55       breast cancer   . Cancer Maternal Aunt 55       breast cancer     ROS Per hpi  OBJECTIVE:  Blood pressure 117/81, pulse 75, temperature 98.4 F (36.9 C), temperature source Oral, resp. rate 17, height 5\' 4"  (1.626 m), weight (!) 423 lb (191.9 kg), last menstrual period 11/12/2017, SpO2 92 %. Body mass index is 72.61 kg/m.   Wt Readings from Last 3 Encounters:  11/13/17 (!) 423 lb (191.9 kg)  08/02/17 (!) 419 lb (190.1 kg)  07/30/17 (!) 412 lb 9.6 oz (187.2 kg)    Physical Exam  Constitutional: She is oriented to person, place, and time. She appears well-developed and well-nourished.  HENT:  Head: Normocephalic and atraumatic.  Mouth/Throat: Mucous membranes are normal.  Eyes: Pupils are equal, round, and reactive to light. EOM are normal. No scleral icterus.  Neck:  Neck supple.  Pulmonary/Chest: Effort normal.  Neurological: She is alert and oriented to person, place, and time.  Skin: Skin is warm and dry.  Psychiatric: She has a normal mood and affect.  Nursing note and vitals reviewed.   ASSESSMENT and PLAN  1. Irregular menses - Ambulatory referral to Gynecology - TSH  2. Family history of breast cancer - Ambulatory referral to Gynecology  3. Type 2 diabetes mellitus with complication, without long-term current use of insulin (HCC) Controlled per last a1c. Discussed low carb diet,  regular exercise and weight loss. Discussed checking cbgs daily, alternating between fasting and 2PP - Basic Metabolic Panel  4. Pulmonary hypertension (HCC) Stable. Continue with O2 at bedtime  5. Benign essential HTN Controlled. Continue current regime.   Other orders - gabapentin (NEURONTIN) 300 MG capsule; Take 1 capsule (300 mg total) by mouth 2 (two) times daily. - Misc. Devices MISC; Bariatric size shower chair with handle 423 lbs, BMI 72  Dx. Pulmonary HTN, DM2 with neuropathy, morbid obesity  Return in about 3 months (around 02/13/2018).    Rutherford Guys, MD Primary Care at [place], [place] [address] Ph.  [phone] Fax [phone]

## 2017-11-13 NOTE — Patient Instructions (Addendum)
Will reorder CPAP for patient Please look online for a more affordable CPAP option if needed Please call and let us know if you purchase a CPAP on your own Work on a healthy weight  Do not drive if drowsy Continue current medications Continue O2 at night as needed Follow up with Dr. Halford Chessman 1 month after getting CPAP

## 2017-11-13 NOTE — Progress Notes (Signed)
Reviewed and agree with assessment/plan.   Bennie Chirico, MD [facility] 03/21/2016, 12:24 PM Pager:  [phone]  

## 2017-11-13 NOTE — Assessment & Plan Note (Signed)
Discussed importance of weight loss. 

## 2017-11-13 NOTE — Assessment & Plan Note (Signed)
Patient Instructions  Will reorder CPAP for patient Please look online for a more affordable CPAP option if needed Please call and let us know if you purchase a CPAP on your own Work on a healthy weight  Do not drive if drowsy Continue current medications Continue O2 at night as needed Follow up with Dr. Halford Chessman 1 month after getting CPAP

## 2017-11-13 NOTE — Progress Notes (Signed)
@Patient  ID: Alexa [name], female    DOB: 1976/01/06, 42 y.o.   MRN: [id number]  Chief Complaint  Patient presents with  . Follow-up    Referring provider: Rutherford Guys, MD  HPI   42 year old female former smoker with chronic respiratory failure and pulmonary hypertension in the setting of OSA and OHS. She is followed by Dr. Halford Chessman. History of unprovoked PE and is on coumadin. History of CHF.   Tests: Pulmonary tests: CT angio chest 05/14/16 >> acute PE with RV:LV ratio 1.26  Sleep tests: PSG 10/11/16 >> AHI 82, SpO2 low 50%  Cardiac tests: Echo 06/11/17 >> EF 70 to 75%, mod RV dilation, mod/severe TR, PAS 80 mmHg  OV 11-13-17 Follow up OSA, pulmonary hypertension, Morbid obesity Patient has been stable since last visit. No recent hospitalizations. She is compliant with medications. Since last visit Patient has had a sleep study and ordered home O2 at night and CPAP per Dr. Halford Chessman. Unfortunately, patient could not afford the CPAP. She did get the O2 and uses it nightly. Denies any shortness of breath. States that lower extremity edema has been under control with Lasix. She did follow up with Cardiology and PCP. Denies any recent  fever, cough, wheezing, or shortness of breath. No new concerns. States that her insurance has been renewed and would like for CPAP to be reordered.     Allergies  Allergen Reactions  . Penicillins Hives    Has patient had a PCN reaction causing immediate rash, facial/tongue/throat swelling, SOB or lightheadedness with hypotension: No Has patient had a PCN reaction causing severe rash involving mucus membranes or skin necrosis: No Has patient had a PCN reaction that required hospitalization No Has patient had a PCN reaction occurring within the last 10 years: No If all of the above answers are "NO", then may proceed with Cephalosporin use.    Immunization History  Administered Date(s) Administered  . Influenza,inj,Quad PF,6+ Mos 03/24/2016,  04/04/2017  . Influenza-Unspecified 01/04/2015  . Pneumococcal Polysaccharide-23 07/30/2017    Past Medical History:  Diagnosis Date  . CHF (congestive heart failure) ([place])   . Gestational diabetes   . Hypertension   . Obesity hypoventilation syndrome ([place])   . Observed sleep apnea   . Pulmonary embolism ([place]) 05/14/2016  . Pulmonary hypertension (HCC)     Tobacco History: Social History   Tobacco Use  Smoking Status Former Smoker  . Types: Cigarettes  Smokeless Tobacco Never Used  Tobacco Comment   only smokes when she drinks alcohol - 1 pack per week   Counseling given: Yes Comment: only smokes when she drinks alcohol - 1 pack per week   Outpatient Encounter Medications as of 11/13/2017  Medication Sig  . acetaminophen (TYLENOL) 500 MG tablet Take 1,000 mg by mouth daily as needed for mild pain.  . cetirizine (ZYRTEC) 10 MG tablet Take 1 tablet (10 mg total) by mouth daily.  . ferrous sulfate 325 (65 FE) MG tablet Take 325 mg by mouth 2 (two) times daily with a meal.   . furosemide (LASIX) 40 MG tablet Take 1 tablet (40 mg total) by mouth 2 (two) times daily.  Marland Kitchen gabapentin (NEURONTIN) 300 MG capsule Take 1 capsule (300 mg total) by mouth 2 (two) times daily.  Marland Kitchen glucose blood test strip ascensia  Meter/strips preferred Test daily Use as instructed Dx.dm  . Lancets (ONETOUCH ULTRASOFT) lancets Test daily Use as instructed dm2  . metFORMIN (GLUCOPHAGE) 500 MG tablet take 1 tablet  by mouth twice a day with meals  . Misc. Devices MISC Bariatric size shower chair with handle 423 lbs, BMI 72  Dx. Pulmonary HTN, DM2 with neuropathy, morbid obesity  . warfarin (COUMADIN) 7.5 MG tablet TAKE 1 AND 1/2 TO 2 TABLETS BY MOUTH DAILY. NEED INR FOLLOW UP PRIOR TO ADDITIONAL REFILL AUTHORIZATION.  . [DISCONTINUED] Misc. Devices MISC Bariatric size shower chair with handle 423 lbs, BMI 72  Dx. Pulmonary HTN, DM2 with neuropathy, morbid obesity   Facility-Administered Encounter  Medications as of 11/13/2017  Medication  . cyanocobalamin ((VITAMIN B-12)) injection 1,000 mcg     Review of Systems  Review of Systems  Constitutional: Negative.   HENT: Negative.   Respiratory: Negative for cough and shortness of breath.   Cardiovascular: Negative.   Gastrointestinal: Negative.   Allergic/Immunologic: Negative.   Neurological: Negative.   Psychiatric/Behavioral: Negative.        Physical Exam  BP 136/78 (BP Location: Right Arm, Patient Position: Sitting, Cuff Size: Large)   Pulse (!) 106   Ht 5\' 4"  (1.626 m)   Wt (!) 429 lb 12.8 oz (195 kg)   LMP 11/12/2017   SpO2 95%   BMI 73.78 kg/m   Wt Readings from Last 5 Encounters:  11/13/17 (!) 429 lb 12.8 oz (195 kg)  11/13/17 (!) 423 lb (191.9 kg)  08/02/17 (!) 419 lb (190.1 kg)  07/30/17 (!) 412 lb 9.6 oz (187.2 kg)  07/03/17 (!) 412 lb (186.9 kg)     Physical Exam  Constitutional: She is oriented to person, place, and time. She appears well-developed and well-nourished. No distress.  Morbid obesity  HENT:  MP Class 3 airway  Cardiovascular: Normal rate and regular rhythm.  Pulmonary/Chest: Effort normal and breath sounds normal.  Neurological: She is alert and oriented to person, place, and time.  Psychiatric: She has a normal mood and affect.  Nursing note and vitals reviewed.       Assessment & Plan:   Hypoxia Chronic hypoxic/hypercapnic respiratory failure in setting of OSA and OHS. Will reorder CPAP Continue home O2 as needed  OSA (obstructive sleep apnea) Patient Instructions  Will reorder CPAP for patient Please look online for a more affordable CPAP option if needed Please call and let us know if you purchase a CPAP on your own Work on a healthy weight  Do not drive if drowsy Continue current medications Continue O2 at night as needed Follow up with Dr. Halford Chessman 1 month after getting CPAP    Pulmonary hypertension ([place]) WHO group 3 pulmonary hypertension. - optimize  respiratory management - will need f/u Echo at some point  Class 3 obesity due to excess calories with serious comorbidity and body mass index (BMI) greater than or equal to 70 in adult Discussed importance of weight loss     Fenton Foy, NP 11/13/2017

## 2017-11-13 NOTE — Assessment & Plan Note (Signed)
Chronic hypoxic/hypercapnic respiratory failure in setting of OSA and OHS. Will reorder CPAP Continue home O2 as needed

## 2017-11-14 ENCOUNTER — Telehealth: Payer: Self-pay | Admitting: Nurse Practitioner

## 2017-11-14 DIAGNOSIS — G4733 Obstructive sleep apnea (adult) (pediatric): Secondary | ICD-10-CM

## 2017-11-14 LAB — BASIC METABOLIC PANEL
BUN/Creatinine Ratio: 14 (ref 9–23)
BUN: 11 mg/dL (ref 6–20)
CO2: 29 mmol/L (ref 20–29)
Calcium: 9 mg/dL (ref 8.7–10.2)
Chloride: 98 mmol/L (ref 96–106)
Creatinine, Ser: 0.77 mg/dL (ref 0.57–1.00)
GFR calc Af Amer: 112 mL/min/{1.73_m2} (ref >59)
GFR calc non Af Amer: 98 mL/min/{1.73_m2} (ref >59)
Glucose: 86 mg/dL (ref 65–99)
Potassium: 4.7 mmol/L (ref 3.5–5.2)
Sodium: 139 mmol/L (ref 134–144)

## 2017-11-14 LAB — TSH: TSH: 1.6 u[IU]/mL (ref 0.450–4.500)

## 2017-11-14 NOTE — Telephone Encounter (Signed)
Pt had a hst on 08/02/17.  I spoke to [place] at [facility] & he states pt will not need new study.  Date of study on order was 12/04/16 & this is what he was going by.  He is going to go ahead and process order.

## 2017-11-14 NOTE — Telephone Encounter (Signed)
Spoke with patient-aware that she will need new sleep study due to waiting so long for CPAP set up. Pt agreed to have new HST and order has been placed under [facility]. Will forward closed message to [place], NP (she seen patient last) and [facility] as FYI.

## 2017-11-15 ENCOUNTER — Telehealth: Payer: Self-pay | Admitting: Nurse Practitioner

## 2017-11-15 ENCOUNTER — Ambulatory Visit (INDEPENDENT_AMBULATORY_CARE_PROVIDER_SITE_OTHER): Payer: 59 | Admitting: Pharmacist

## 2017-11-15 DIAGNOSIS — Z7901 Long term (current) use of anticoagulants: Secondary | ICD-10-CM | POA: Diagnosis not present

## 2017-11-15 DIAGNOSIS — I2699 Other pulmonary embolism without acute cor pulmonale: Secondary | ICD-10-CM

## 2017-11-15 LAB — POCT INR: INR: 1.7 — AB (ref 2.0–3.0)

## 2017-11-15 MED ORDER — WARFARIN SODIUM 7.5 MG PO TABS: ORAL_TABLET | ORAL | 0 refills | Status: DC

## 2017-11-15 NOTE — Telephone Encounter (Signed)
Spoke with Barbaraann Rondo at [facility], states that he has left a message for pt to relay that she will have to pay her outstanding balance before new equipment can be provided.  Pt was ordered a new cpap on 8/21.  Noted by triage.  Will close encounter.

## 2017-11-18 ENCOUNTER — Telehealth: Payer: Self-pay | Admitting: Obstetrics and Gynecology

## 2017-11-18 NOTE — Telephone Encounter (Signed)
Called and left a message for patient to call back to schedule a new patient doctor referral appointment with our office to see any provider for irregular cycles.

## 2017-11-20 NOTE — Telephone Encounter (Signed)
Called and left a message for patient to call back to schedule a new patient doctor referral appointment with our office to see any provider for irregular cycles.

## 2017-11-21 NOTE — Telephone Encounter (Signed)
Called and left a message for patient to call back to schedule a new patient doctor referral appointment with our officeto see any provider for irregular cycles.

## 2017-11-27 NOTE — Telephone Encounter (Signed)
Routing referral back to referring office. Patient has not returned multiple calls to schedule an appointment with our office. °

## 2017-12-06 DIAGNOSIS — I272 Pulmonary hypertension, unspecified: Secondary | ICD-10-CM | POA: Diagnosis not present

## 2017-12-06 DIAGNOSIS — I2699 Other pulmonary embolism without acute cor pulmonale: Secondary | ICD-10-CM | POA: Diagnosis not present

## 2017-12-06 DIAGNOSIS — J9601 Acute respiratory failure with hypoxia: Secondary | ICD-10-CM | POA: Diagnosis not present

## 2017-12-19 ENCOUNTER — Other Ambulatory Visit: Payer: Self-pay | Admitting: Cardiovascular Disease

## 2017-12-19 ENCOUNTER — Other Ambulatory Visit: Payer: Self-pay | Admitting: Family Medicine

## 2017-12-19 DIAGNOSIS — Z7901 Long term (current) use of anticoagulants: Secondary | ICD-10-CM

## 2017-12-19 DIAGNOSIS — R609 Edema, unspecified: Secondary | ICD-10-CM

## 2017-12-19 DIAGNOSIS — I2699 Other pulmonary embolism without acute cor pulmonale: Secondary | ICD-10-CM

## 2017-12-25 ENCOUNTER — Ambulatory Visit (INDEPENDENT_AMBULATORY_CARE_PROVIDER_SITE_OTHER): Payer: 59 | Admitting: Pharmacist Clinician (PhC)/ Clinical Pharmacy Specialist

## 2017-12-25 DIAGNOSIS — I2699 Other pulmonary embolism without acute cor pulmonale: Secondary | ICD-10-CM

## 2017-12-25 DIAGNOSIS — Z7901 Long term (current) use of anticoagulants: Secondary | ICD-10-CM

## 2017-12-25 LAB — POCT INR: INR: 1 — AB (ref 2.0–3.0)

## 2017-12-25 MED ORDER — WARFARIN SODIUM 7.5 MG PO TABS: ORAL_TABLET | ORAL | 1 refills | Status: DC

## 2017-12-25 NOTE — Patient Instructions (Signed)
Description   Take 2.5 tablets for 2 days, then continue with 2 tablets daily except 1.5 tablets each Monday, Wednesday and Friday. Repeat INR in 3 weeks

## 2018-01-05 DIAGNOSIS — I272 Pulmonary hypertension, unspecified: Secondary | ICD-10-CM | POA: Diagnosis not present

## 2018-01-05 DIAGNOSIS — I2699 Other pulmonary embolism without acute cor pulmonale: Secondary | ICD-10-CM | POA: Diagnosis not present

## 2018-01-05 DIAGNOSIS — J9601 Acute respiratory failure with hypoxia: Secondary | ICD-10-CM | POA: Diagnosis not present

## 2018-01-16 ENCOUNTER — Ambulatory Visit (INDEPENDENT_AMBULATORY_CARE_PROVIDER_SITE_OTHER): Payer: 59 | Admitting: Pharmacist Clinician (PhC)/ Clinical Pharmacy Specialist

## 2018-01-16 DIAGNOSIS — I7410 Embolism and thrombosis of unspecified parts of aorta: Secondary | ICD-10-CM

## 2018-01-16 DIAGNOSIS — Z7901 Long term (current) use of anticoagulants: Secondary | ICD-10-CM | POA: Diagnosis not present

## 2018-01-16 LAB — POCT INR: INR: 2.9 (ref 2.0–3.0)

## 2018-01-21 ENCOUNTER — Other Ambulatory Visit: Payer: Self-pay | Admitting: Family Medicine

## 2018-01-21 DIAGNOSIS — R609 Edema, unspecified: Secondary | ICD-10-CM

## 2018-01-21 NOTE — Telephone Encounter (Signed)
Copied from [place] [id number]. Topic: Quick Communication - Rx Refill/Question >> Jan 21, 2018  1:40 PM [place], [place] D wrote: Medication: furosemide (LASIX) 40 MG tablet / gabapentin (NEURONTIN) 300 MG capsule    Has the patient contacted their pharmacy? Yes.   (Agent: If no, request that the patient contact the pharmacy for the refill.) (Agent: If yes, when and what did the pharmacy advise?)  Preferred Pharmacy (with phone number or street name): [facility] [phone] - [place], [address] AT [address] [phone] (Phone) [phone] (Fax)    Agent: Please be advised that RX refills may take up to 3 business days. We ask that you follow-up with your pharmacy.

## 2018-01-23 MED ORDER — FUROSEMIDE 40 MG PO TABS: ORAL_TABLET | ORAL | 0 refills | Status: DC

## 2018-01-23 MED ORDER — GABAPENTIN 300 MG PO CAPS: 300.0000 mg | ORAL_CAPSULE | Freq: Two times a day (BID) | ORAL | 0 refills | Status: DC

## 2018-02-05 DIAGNOSIS — J9601 Acute respiratory failure with hypoxia: Secondary | ICD-10-CM | POA: Diagnosis not present

## 2018-02-05 DIAGNOSIS — I2699 Other pulmonary embolism without acute cor pulmonale: Secondary | ICD-10-CM | POA: Diagnosis not present

## 2018-02-05 DIAGNOSIS — I272 Pulmonary hypertension, unspecified: Secondary | ICD-10-CM | POA: Diagnosis not present

## 2018-02-19 ENCOUNTER — Other Ambulatory Visit: Payer: Self-pay

## 2018-02-19 ENCOUNTER — Ambulatory Visit: Payer: 59 | Admitting: Family Medicine

## 2018-02-19 ENCOUNTER — Encounter: Payer: Self-pay | Admitting: Family Medicine

## 2018-02-19 VITALS — BP 138/80 | HR 100 | Temp 98.0°F | Ht 64.0 in | Wt >= 6400 oz

## 2018-02-19 DIAGNOSIS — I10 Essential (primary) hypertension: Secondary | ICD-10-CM | POA: Diagnosis not present

## 2018-02-19 DIAGNOSIS — E118 Type 2 diabetes mellitus with unspecified complications: Secondary | ICD-10-CM | POA: Diagnosis not present

## 2018-02-19 DIAGNOSIS — Z6841 Body Mass Index (BMI) 40.0 and over, adult: Secondary | ICD-10-CM

## 2018-02-19 LAB — POCT GLYCOSYLATED HEMOGLOBIN (HGB A1C): Hemoglobin A1C: 6.1 % — AB (ref 4.0–5.6)

## 2018-02-19 MED ORDER — ERTUGLIFLOZIN L-PYROGLUTAMICAC 5 MG PO TABS: 5.0000 mg | ORAL_TABLET | Freq: Every day | ORAL | 12 refills | Status: DC

## 2018-02-19 NOTE — Progress Notes (Signed)
11/27/20193:36 PM  [name] Apr 02, 1975, 42 y.o. female [id number]  Chief Complaint  Patient presents with  . Diabetes    Gabapentin, metformin, Lasix refills    HPI:   Patient is a 42 y.o. female with past medical history significant for DM2, Cor pulmonale, OSA untreated, pHTN, on long term anticoagulation who present for followup  Has been eating late at night, full meals Has not been checking her blood glucose of recent Denies any increase in leg swelling Today has felt a bit short breath No cough Denies orthopnea or PND Denies any chest pain or palpitations Does use her oxygen at night  Lab Results  Component Value Date   HGBA1C 6.1 (A) 02/19/2018   HGBA1C 6.1 07/30/2017   HGBA1C 5.7 (H) 06/10/2017   Lab Results  Component Value Date   MICROALBUR 1.4 01/28/2016   LDLCALC 79 04/04/2017   CREATININE 0.77 11/13/2017    Fall Risk  02/19/2018 11/13/2017 07/30/2017 04/04/2017 04/04/2017  Falls in the past year? 0 No No No No     Depression screen [facility] 2/9 02/19/2018 11/13/2017 07/30/2017  Decreased Interest 0 1 0  Down, Depressed, Hopeless 0 1 0  PHQ - 2 Score 0 2 0  Altered sleeping - 0 -  Tired, decreased energy - 0 -  Change in appetite - 0 -  Feeling bad or failure about yourself  - 1 -  Trouble concentrating - 0 -  Moving slowly or fidgety/restless - 0 -  Suicidal thoughts - 0 -  PHQ-9 Score - 3 -  Difficult doing work/chores - Not difficult at all -    Allergies  Allergen Reactions  . Penicillins Hives    Has patient had a PCN reaction causing immediate rash, facial/tongue/throat swelling, SOB or lightheadedness with hypotension: No Has patient had a PCN reaction causing severe rash involving mucus membranes or skin necrosis: No Has patient had a PCN reaction that required hospitalization No Has patient had a PCN reaction occurring within the last 10 years: No If all of the above answers are "NO", then may proceed with Cephalosporin use.     Prior to Admission medications   Medication Sig Start Date End Date Taking? Authorizing Provider  acetaminophen (TYLENOL) 500 MG tablet Take 1,000 mg by mouth daily as needed for mild pain.   Yes [provider]  cetirizine (ZYRTEC) 10 MG tablet Take 1 tablet (10 mg total) by mouth daily. 12/11/16  Yes Ivar Drape D, PA  ferrous sulfate 325 (65 FE) MG tablet Take 325 mg by mouth 2 (two) times daily with a meal.    Yes [provider]  furosemide (LASIX) 40 MG tablet TAKE 1 TABLET(40 MG) BY MOUTH TWICE DAILY 01/23/18  Yes Alexa Guys, MD  gabapentin (NEURONTIN) 300 MG capsule Take 1 capsule (300 mg total) by mouth 2 (two) times daily. 01/23/18  Yes Alexa Guys, MD  glucose blood test strip ascensia  Meter/strips preferred Test daily Use as instructed Dx.dm 06/23/16  Yes English, Colletta Maryland D, PA  Lancets [facility] ULTRASOFT) lancets Test daily Use as instructed dm2 06/22/16  Yes English, [name] D, PA  metFORMIN (GLUCOPHAGE) 500 MG tablet take 1 tablet by mouth twice a day with meals 04/30/17  Yes Sagardia, Ines Bloomer, MD  Misc. Devices MISC Bariatric size shower chair with handle 423 lbs, BMI 72  Dx. Pulmonary HTN, DM2 with neuropathy, morbid obesity 11/13/17  Yes Alexa Guys, MD  warfarin (COUMADIN) 7.5 MG tablet TAKE 1  AND 1/2 TO 2 TABLETS BY MOUTH DAILY. FOLLOW UP BEFORE ADDITIONAL REFILL AUTHORIZATION 12/25/17  Yes Hilty, Nadean Corwin, MD    Past Medical History:  Diagnosis Date  . CHF (congestive heart failure) ([place])   . DM2 (diabetes mellitus, type 2) ([place])   . Hypertension   . Nocturnal hypoxia   . Obstructive sleep apnea syndrome, moderate   . Pulmonary embolism ([place]) 05/14/2016   submassive, on chronic anticoagulation  . Pulmonary hypertension ([place])     Past Surgical History:  Procedure Laterality Date  . CESAREAN SECTION      Social History   Tobacco Use  . Smoking status: Former Smoker    Types: Cigarettes  . Smokeless tobacco:  Never Used  . Tobacco comment: only smokes when she drinks alcohol - 1 pack per week  Substance Use Topics  . Alcohol use: Yes    Alcohol/week: 4.0 - 6.0 standard drinks    Types: 4 - 6 Standard drinks or equivalent per week    Comment: mixed drinks 3-4 times/weeks    Family History  Problem Relation Age of Onset  . Diabetes Maternal Grandmother   . Cancer Maternal Grandmother        breast cancer   . Hypertension Mother   . Cancer Maternal Aunt 55       breast cancer   . Cancer Maternal Aunt 55       breast cancer     ROS Per hpi  OBJECTIVE:  Blood pressure 138/80, pulse 100, temperature 98 F (36.7 C), temperature source Oral, height '5\' 4"'$  (1.626 m), weight (!) 447 lb 12.8 oz (203.1 kg), SpO2 90 %. Body mass index is 76.86 kg/m.   Wt Readings from Last 3 Encounters:  02/19/18 (!) 447 lb 12.8 oz (203.1 kg)  11/13/17 (!) 429 lb 12.8 oz (195 kg)  11/13/17 (!) 423 lb (191.9 kg)    Physical Exam  Constitutional: She is oriented to person, place, and time. She appears well-developed and well-nourished.  HENT:  Head: Normocephalic and atraumatic.  Mouth/Throat: Oropharynx is clear and moist. No oropharyngeal exudate.  Eyes: Pupils are equal, round, and reactive to light. Conjunctivae and EOM are normal. No scleral icterus.  Neck: Neck supple. No JVD present.  Cardiovascular: Normal rate, regular rhythm and normal heart sounds. Exam reveals no gallop and no friction rub.  No murmur heard. Pulmonary/Chest: Effort normal and breath sounds normal. She has no wheezes. She has no rales.  Musculoskeletal: She exhibits edema (chronic thick hyperpigmented skin changes, + 1 edema).  Neurological: She is alert and oriented to person, place, and time.  Skin: Skin is warm and dry.  Psychiatric: She has a normal mood and affect.  Nursing note and vitals reviewed.    Results for orders placed or performed in visit on 02/19/18 (from the past 24 hour(s))  POCT glycosylated hemoglobin  (Hb A1C)     Status: Abnormal   Collection Time: 02/19/18  3:25 PM  Result Value Ref Range   Hemoglobin A1C 6.1 (A) 4.0 - 5.6 %   HbA1c POC (<> result, manual entry)     HbA1c, POC (prediabetic range)     HbA1c, POC (controlled diabetic range)      ASSESSMENT and PLAN  1. Type 2 diabetes mellitus with complication, without long-term current use of insulin (HCC) - Lipid panel - CMP14+EGFR - POCT glycosylated hemoglobin (Hb A1C)  2. Benign essential HTN - Care order/instruction:  3. Body mass index (BMI) of 70 or greater  in adult [facility])  Chronic medical conditions are stable. However has had weight gain due to caloric excess. Discussed GLP1 vs SGLT2 r/se/b for DM2 and weight control. Patient would like to do trial. Coupon given. Also discussed decreasing metformin to once daily and checking daily cbgs. Discussed importance of diet.   Other orders - Ertugliflozin L-PyroglutamicAc (STEGLATRO) 5 MG TABS; Take 5 mg by mouth daily.   Return in about 4 weeks (around 03/19/2018).    Alexa Guys, MD Primary Care at [place], [place] [address] Ph.  [phone] Fax [phone]

## 2018-02-19 NOTE — Patient Instructions (Addendum)
Decrease metfomin to once a day Check blood glucose once a day, alternate fasting with 2 hours after dinner    If you have lab work done today you will be contacted with your lab results within the next 2 weeks.  If you have not heard from [place] then please contact us. The fastest way to get your results is to register for My Chart.   IF you received an x-ray today, you will receive an invoice from [facility]. Please contact [facility] at [phone] with questions or concerns regarding your invoice.   IF you received labwork today, you will receive an invoice from [place]. Please contact [facility] at [phone] with questions or concerns regarding your invoice.   Our billing staff will not be able to assist you with questions regarding bills from these companies.  You will be contacted with the lab results as soon as they are available. The fastest way to get your results is to activate your My Chart account. Instructions are located on the last page of this paperwork. If you have not heard from [place] regarding the results in 2 weeks, please contact this office.

## 2018-02-20 LAB — CMP14+EGFR
ALT: 10 IU/L (ref 0–32)
AST: 18 IU/L (ref 0–40)
Albumin/Globulin Ratio: 1.2 (ref 1.2–2.2)
Albumin: 4.1 g/dL (ref 3.5–5.5)
Alkaline Phosphatase: 59 IU/L (ref 39–117)
BUN/Creatinine Ratio: 15 (ref 9–23)
BUN: 12 mg/dL (ref 6–24)
Bilirubin Total: 0.2 mg/dL (ref 0.0–1.2)
CO2: 27 mmol/L (ref 20–29)
Calcium: 8.7 mg/dL (ref 8.7–10.2)
Chloride: 97 mmol/L (ref 96–106)
Creatinine, Ser: 0.82 mg/dL (ref 0.57–1.00)
GFR calc Af Amer: 104 mL/min/{1.73_m2} (ref >59)
GFR calc non Af Amer: 90 mL/min/{1.73_m2} (ref >59)
Globulin, Total: 3.5 g/dL (ref 1.5–4.5)
Glucose: 89 mg/dL (ref 65–99)
Potassium: 4.2 mmol/L (ref 3.5–5.2)
Sodium: 138 mmol/L (ref 134–144)
Total Protein: 7.6 g/dL (ref 6.0–8.5)

## 2018-02-20 LAB — LIPID PANEL
Chol/HDL Ratio: 2.7 ratio (ref 0.0–4.4)
Cholesterol, Total: 159 mg/dL (ref 100–199)
HDL: 58 mg/dL (ref >39)
LDL Calculated: 85 mg/dL (ref 0–99)
Triglycerides: 78 mg/dL (ref 0–149)
VLDL Cholesterol Cal: 16 mg/dL (ref 5–40)

## 2018-02-20 LAB — TSH: TSH: 1.52 u[IU]/mL (ref 0.450–4.500)

## 2018-02-25 ENCOUNTER — Telehealth: Payer: Self-pay

## 2018-02-25 NOTE — Telephone Encounter (Signed)
Left msg for pt to call back regarding overdue inr result

## 2018-02-27 ENCOUNTER — Telehealth: Payer: Self-pay | Admitting: Family Medicine

## 2018-02-27 NOTE — Telephone Encounter (Signed)
Patient was denied their medication Steglatro by their insurance company. Denial letter is placed in the providers box at nurses station.

## 2018-02-27 NOTE — Telephone Encounter (Signed)
I gave patient a coupon for this medication. Can you please find out if she was able to use it? It should cover medication even when insurance does not. thanks

## 2018-03-03 NOTE — Telephone Encounter (Signed)
Called and spoke with patient. She stated that the medication Steglatro was too expensive and had not tried the coupon. She states she will try to get with coupon and will let us know how much it will cover.

## 2018-03-07 DIAGNOSIS — I2699 Other pulmonary embolism without acute cor pulmonale: Secondary | ICD-10-CM | POA: Diagnosis not present

## 2018-03-07 DIAGNOSIS — J9601 Acute respiratory failure with hypoxia: Secondary | ICD-10-CM | POA: Diagnosis not present

## 2018-03-07 DIAGNOSIS — I272 Pulmonary hypertension, unspecified: Secondary | ICD-10-CM | POA: Diagnosis not present

## 2018-03-27 ENCOUNTER — Other Ambulatory Visit: Payer: Self-pay | Admitting: Family Medicine

## 2018-03-27 ENCOUNTER — Ambulatory Visit: Payer: 59 | Admitting: Family Medicine

## 2018-03-27 DIAGNOSIS — R609 Edema, unspecified: Secondary | ICD-10-CM

## 2018-04-04 NOTE — Telephone Encounter (Signed)
Called pt to schedule overdue inr left msg 

## 2018-04-07 DIAGNOSIS — J9601 Acute respiratory failure with hypoxia: Secondary | ICD-10-CM | POA: Diagnosis not present

## 2018-04-07 DIAGNOSIS — I272 Pulmonary hypertension, unspecified: Secondary | ICD-10-CM | POA: Diagnosis not present

## 2018-04-07 DIAGNOSIS — I2699 Other pulmonary embolism without acute cor pulmonale: Secondary | ICD-10-CM | POA: Diagnosis not present

## 2018-04-07 NOTE — Telephone Encounter (Signed)
Called pt to schedule overdue inr and to let them know that if they request a refill it will not be processed until they get a coumadin check

## 2018-04-08 ENCOUNTER — Other Ambulatory Visit: Payer: Self-pay

## 2018-04-08 DIAGNOSIS — I2699 Other pulmonary embolism without acute cor pulmonale: Secondary | ICD-10-CM

## 2018-04-08 DIAGNOSIS — Z7901 Long term (current) use of anticoagulants: Secondary | ICD-10-CM

## 2018-04-08 MED ORDER — WARFARIN SODIUM 7.5 MG PO TABS: ORAL_TABLET | ORAL | 0 refills | Status: DC

## 2018-04-18 ENCOUNTER — Ambulatory Visit (INDEPENDENT_AMBULATORY_CARE_PROVIDER_SITE_OTHER): Payer: 59 | Admitting: Pharmacist Clinician (PhC)/ Clinical Pharmacy Specialist

## 2018-04-18 DIAGNOSIS — Z86711 Personal history of pulmonary embolism: Secondary | ICD-10-CM | POA: Diagnosis not present

## 2018-04-18 DIAGNOSIS — Z7901 Long term (current) use of anticoagulants: Secondary | ICD-10-CM

## 2018-04-18 LAB — POCT INR: INR: 2.4 (ref 2.0–3.0)

## 2018-04-26 ENCOUNTER — Other Ambulatory Visit: Payer: Self-pay | Admitting: Emergency Medicine

## 2018-04-26 ENCOUNTER — Other Ambulatory Visit: Payer: Self-pay | Admitting: Family Medicine

## 2018-04-26 DIAGNOSIS — Z7189 Other specified counseling: Secondary | ICD-10-CM

## 2018-04-28 NOTE — Telephone Encounter (Signed)
Requested Prescriptions  Pending Prescriptions Disp Refills  . 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

## 2018-04-28 NOTE — Telephone Encounter (Signed)
Requested Prescriptions  Pending Prescriptions Disp Refills  . metFORMIN (GLUCOPHAGE) 500 MG tablet [Pharmacy Med Name: METFORMIN '500MG'$  TABLETS] 180 tablet 0    Sig: TAKE 1 TABLET BY MOUTH TWICE DAILY WITH MEALS     Endocrinology:  Diabetes - Biguanides Passed - 04/26/2018 11:25 AM      Passed - Cr in normal range and within 360 days    Creatinine  Date Value Ref Range Status  05/24/2017 0.84 0.60 - 1.10 mg/dL Final   Creat  Date Value Ref Range Status  01/28/2016 0.90 0.50 - 1.10 mg/dL Final   Creatinine, Ser  Date Value Ref Range Status  02/19/2018 0.82 0.57 - 1.00 mg/dL Final         Passed - HBA1C is between 0 and 7.9 and within 180 days    Hemoglobin A1C  Date Value Ref Range Status  02/19/2018 6.1 (A) 4.0 - 5.6 % Final  08/25/2014 6.4  Final   Hgb A1c MFr Bld  Date Value Ref Range Status  06/10/2017 5.7 (H) 4.8 - 5.6 % Final    Comment:    (NOTE) Pre diabetes:          5.7%-6.4% Diabetes:              >6.4% Glycemic control for   <7.0% adults with diabetes          Passed - eGFR in normal range and within 360 days    GFR, Est African American  Date Value Ref Range Status  01/28/2016 >89 >=60 mL/min Final   GFR, Est AFR Am  Date Value Ref Range Status  05/24/2017 >60 >60 mL/min Final    Comment:    (NOTE) The eGFR has been calculated using the CKD EPI equation. This calculation has not been validated in all clinical situations. eGFR's persistently <60 mL/min signify possible Chronic Kidney Disease.    GFR calc Af Amer  Date Value Ref Range Status  02/19/2018 104 >59 mL/min/1.73 Final   GFR, Est Non African American  Date Value Ref Range Status  01/28/2016 81 >=60 mL/min Final   GFR, Est Non Af Am  Date Value Ref Range Status  05/24/2017 >60 >60 mL/min Final   GFR calc non Af Amer  Date Value Ref Range Status  02/19/2018 90 >59 mL/min/1.73 Final         Passed - Valid encounter within last 6 months    Recent Outpatient Visits          2  months ago Type 2 diabetes mellitus with complication, without long-term current use of insulin ([place])   [facility] at [facility], Lilia Argue, MD   5 months ago Irregular menses   [facility] Care at [facility], Lilia Argue, MD   9 months ago 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

## 2018-04-29 ENCOUNTER — Other Ambulatory Visit: Payer: Self-pay | Admitting: Internal Medicine

## 2018-04-29 DIAGNOSIS — Z7901 Long term (current) use of anticoagulants: Secondary | ICD-10-CM

## 2018-04-29 DIAGNOSIS — I2699 Other pulmonary embolism without acute cor pulmonale: Secondary | ICD-10-CM

## 2018-05-01 ENCOUNTER — Other Ambulatory Visit: Payer: Self-pay

## 2018-05-01 ENCOUNTER — Encounter: Payer: Self-pay | Admitting: Family Medicine

## 2018-05-01 ENCOUNTER — Ambulatory Visit (INDEPENDENT_AMBULATORY_CARE_PROVIDER_SITE_OTHER): Payer: 59

## 2018-05-01 ENCOUNTER — Ambulatory Visit: Payer: 59 | Admitting: Family Medicine

## 2018-05-01 VITALS — BP 145/80 | HR 95 | Temp 98.5°F | Ht 64.0 in | Wt >= 6400 oz

## 2018-05-01 DIAGNOSIS — R609 Edema, unspecified: Secondary | ICD-10-CM

## 2018-05-01 DIAGNOSIS — I10 Essential (primary) hypertension: Secondary | ICD-10-CM | POA: Diagnosis not present

## 2018-05-01 DIAGNOSIS — I2781 Cor pulmonale (chronic): Secondary | ICD-10-CM

## 2018-05-01 DIAGNOSIS — R0602 Shortness of breath: Secondary | ICD-10-CM | POA: Diagnosis not present

## 2018-05-01 DIAGNOSIS — E118 Type 2 diabetes mellitus with unspecified complications: Secondary | ICD-10-CM

## 2018-05-01 DIAGNOSIS — Z6841 Body Mass Index (BMI) 40.0 and over, adult: Secondary | ICD-10-CM | POA: Diagnosis not present

## 2018-05-01 DIAGNOSIS — Z1231 Encounter for screening mammogram for malignant neoplasm of breast: Secondary | ICD-10-CM

## 2018-05-01 DIAGNOSIS — Z803 Family history of malignant neoplasm of breast: Secondary | ICD-10-CM

## 2018-05-01 LAB — COMPREHENSIVE METABOLIC PANEL
ALT: 12 IU/L (ref 0–32)
AST: 15 IU/L (ref 0–40)
Albumin/Globulin Ratio: 1.2 (ref 1.2–2.2)
Albumin: 3.8 g/dL (ref 3.8–4.8)
Alkaline Phosphatase: 54 IU/L (ref 39–117)
BUN/Creatinine Ratio: 13 (ref 9–23)
BUN: 10 mg/dL (ref 6–24)
Bilirubin Total: 0.2 mg/dL (ref 0.0–1.2)
CO2: 28 mmol/L (ref 20–29)
Calcium: 8.9 mg/dL (ref 8.7–10.2)
Chloride: 98 mmol/L (ref 96–106)
Creatinine, Ser: 0.8 mg/dL (ref 0.57–1.00)
GFR calc Af Amer: 107 mL/min/{1.73_m2} (ref >59)
GFR calc non Af Amer: 93 mL/min/{1.73_m2} (ref >59)
Globulin, Total: 3.3 g/dL (ref 1.5–4.5)
Glucose: 87 mg/dL (ref 65–99)
Potassium: 4.8 mmol/L (ref 3.5–5.2)
Sodium: 140 mmol/L (ref 134–144)
Total Protein: 7.1 g/dL (ref 6.0–8.5)

## 2018-05-01 MED ORDER — DULAGLUTIDE 0.75 MG/0.5ML ~~LOC~~ SOAJ: 0.7500 mg | SUBCUTANEOUS | 5 refills | Status: DC

## 2018-05-01 MED ORDER — FUROSEMIDE 40 MG PO TABS: 60.0000 mg | ORAL_TABLET | Freq: Two times a day (BID) | ORAL | 0 refills | Status: DC

## 2018-05-01 NOTE — Progress Notes (Signed)
2/6/20209:16 AM  Alexa [name] Nov 12, 1975, 43 y.o. female [id number]  Chief Complaint  Patient presents with  . Follow-up    weight, has gained weight since last visit. medication that was rx'd from last visit cost was too much. Insurance does not cover    HPI:   Patient is a 43 y.o. female with past medical history significant for DM2, Cor pulmonale, OSA untreated, pHTN, on long term anticoagulation who present for followup  Last OV nov 2019, started on steglatro, gave coupon Sees coumadin clinic monthly Sees cardiology on May 23 2018  Has not had eye exam yet, needs request  Breast cancer fhx: maternal great aunt, maternal aunt x 2, One of your aunts had positive genetic testing Patient is requesting mammogram  Takes furosemide 40mg  BID Sometimes feels it does not work that well Will get intermittent increase in swelling and SOB Tries to follow low salt diet Edema is stable No chest pain, chest heaviness, no SOB Does not have cpap machine, has outstanding bill, hoping to use tax money for it She has been using her oxygen 2L at night  Has been rescheduled from weight loss clinic multiple times, she is not interested anymore  Lab Results  Component Value Date   HGBA1C 6.1 (A) 02/19/2018   HGBA1C 6.1 07/30/2017   HGBA1C 5.7 (H) 06/10/2017   Lab Results  Component Value Date   MICROALBUR 1.4 01/28/2016   LDLCALC 85 02/19/2018   CREATININE 0.82 02/19/2018    Lab Results  Component Value Date   INR 2.4 04/18/2018   INR 2.9 01/16/2018   INR 1.0 (A) 12/25/2017    Fall Risk  05/01/2018 02/19/2018 11/13/2017 07/30/2017 04/04/2017  Falls in the past year? 0 0 No No No  Number falls in past yr: 0 - - - -  Injury with Fall? 1 - - - -     Depression screen [facility] 2/9 05/01/2018 02/19/2018 11/13/2017  Decreased Interest 0 0 1  Down, Depressed, Hopeless 0 0 1  PHQ - 2 Score 0 0 2  Altered sleeping - - 0  Tired, decreased energy - - 0  Change in appetite - - 0    Feeling bad or failure about yourself  - - 1  Trouble concentrating - - 0  Moving slowly or fidgety/restless - - 0  Suicidal thoughts - - 0  PHQ-9 Score - - 3  Difficult doing work/chores - - Not difficult at all    Allergies  Allergen Reactions  . Penicillins Hives    Has patient had a PCN reaction causing immediate rash, facial/tongue/throat swelling, SOB or lightheadedness with hypotension: No Has patient had a PCN reaction causing severe rash involving mucus membranes or skin necrosis: No Has patient had a PCN reaction that required hospitalization No Has patient had a PCN reaction occurring within the last 10 years: No If all of the above answers are "NO", then may proceed with Cephalosporin use.    Prior to Admission medications   Medication Sig Start Date End Date Taking? Authorizing Provider  acetaminophen (TYLENOL) 500 MG tablet Take 1,000 mg by mouth daily as needed for mild pain.   Yes [provider]  cetirizine (ZYRTEC) 10 MG tablet Take 1 tablet (10 mg total) by mouth daily. 12/11/16  Yes English, Colletta Maryland D, PA  Ertugliflozin L-PyroglutamicAc (STEGLATRO) 5 MG TABS Take 5 mg by mouth daily. 02/19/18  Yes Rutherford Guys, MD  ferrous sulfate 325 (65 FE) MG tablet Take 325 mg  by mouth 2 (two) times daily with a meal.    Yes [provider]  furosemide (LASIX) 40 MG tablet TAKE 1 TABLET BY MOUTH TWICE DAILY 03/27/18  Yes Rutherford Guys, MD  gabapentin (NEURONTIN) 300 MG capsule TAKE 1 CAPSULE(300 MG) BY MOUTH TWICE DAILY 04/28/18  Yes Grant Fontana M, MD  glucose blood test strip ascensia  Meter/strips preferred Test daily Use as instructed Dx.dm 06/23/16  Yes English, Colletta Maryland D, PA  Lancets [facility] ULTRASOFT) lancets Test daily Use as instructed dm2 06/22/16  Yes English, Colletta [place] D, PA  metFORMIN (GLUCOPHAGE) 500 MG tablet TAKE 1 TABLET BY MOUTH TWICE DAILY WITH MEALS 04/28/18  Yes Sagardia, Ines Bloomer, MD  Misc. Devices MISC Bariatric size shower chair  with handle 423 lbs, BMI 72  Dx. Pulmonary HTN, DM2 with neuropathy, morbid obesity 11/13/17  Yes Rutherford Guys, MD  warfarin (COUMADIN) 7.5 MG tablet TAKE 1 AND 1/2 TO 2 TABLETS BY MOUTH DAILY. FOLLOW UP BEFORE ADDITIONAL REFILL AUTHORIZATION 04/30/18  Yes Troy Sine, MD    Past Medical History:  Diagnosis Date  . CHF (congestive heart failure) ([place])   . DM2 (diabetes mellitus, type 2) ([place])   . Hypertension   . Nocturnal hypoxia   . Obstructive sleep apnea syndrome, moderate   . Pulmonary embolism ([place]) 05/14/2016   submassive, on chronic anticoagulation  . Pulmonary hypertension ([place])     Past Surgical History:  Procedure Laterality Date  . CESAREAN SECTION      Social History   Tobacco Use  . Smoking status: Former Smoker    Types: Cigarettes  . Smokeless tobacco: Never Used  . Tobacco comment: only smokes when she drinks alcohol - 1 pack per week  Substance Use Topics  . Alcohol use: Yes    Alcohol/week: 4.0 - 6.0 standard drinks    Types: 4 - 6 Standard drinks or equivalent per week    Comment: mixed drinks 3-4 times/weeks    Family History  Problem Relation Age of Onset  . Diabetes Maternal Grandmother   . Cancer Maternal Grandmother        breast cancer   . Hypertension Mother   . Cancer Maternal Aunt 55       breast cancer   . Cancer Maternal Aunt 48       breast cancer     ROS Per hpi  OBJECTIVE:  Blood pressure (!) 145/80, pulse 95, temperature 98.5 F (36.9 C), temperature source Oral, height 5\' 4"  (1.626 m), weight (!) 448 lb (203.2 kg), SpO2 90 %. Body mass index is 76.9 kg/m.   Wt Readings from Last 3 Encounters:  05/01/18 (!) 448 lb (203.2 kg)  02/19/18 (!) 447 lb 12.8 oz (203.1 kg)  11/13/17 (!) 429 lb 12.8 oz (195 kg)    Physical Exam Vitals signs and nursing note reviewed.  Constitutional:      Appearance: She is well-developed.  HENT:     Head: Normocephalic and atraumatic.     Mouth/Throat:     Pharynx: No  oropharyngeal exudate.  Eyes:     General: No scleral icterus.    Conjunctiva/sclera: Conjunctivae normal.     Pupils: Pupils are equal, round, and reactive to light.  Neck:     Musculoskeletal: Neck supple.     Vascular: JVD present.  Cardiovascular:     Rate and Rhythm: Normal rate and regular rhythm.     Heart sounds: Murmur present. Systolic (best heard at left lower sternal border)  murmur present with a grade of 2/6. No friction rub. No gallop.   Pulmonary:     Effort: Pulmonary effort is normal.     Breath sounds: Normal breath sounds. No wheezing or rales.  Musculoskeletal:     Right lower leg: Edema (woody edema) present.     Left lower leg: Edema (woody edema) present.  Skin:    General: Skin is warm and dry.  Neurological:     Mental Status: She is alert and oriented to person, place, and time.    Dg Chest 2 View  Result Date: 05/01/2018 CLINICAL DATA:  Cor pulmonale, shortness of breath. EXAM: CHEST - 2 VIEW COMPARISON:  Radiographs of June 26, 2017. FINDINGS: Stable cardiomegaly with central pulmonary vascular congestion. No pneumothorax or pleural effusion is noted. No consolidative process is noted. The visualized skeletal structures are unremarkable. IMPRESSION: Stable cardiomegaly with central pulmonary vascular congestion. Electronically Signed   By: Marijo Conception, M.D.   On: 05/01/2018 10:05     ASSESSMENT and PLAN  1. Benign essential HTN Above goal, patient fluid over loaded. Discussed importance of CPAP given pHTN and cor pulmonale. Increasing lasix. Keep upcoming appt with cards. Low salt diet. Fluid restriction.   2. Body mass index (BMI) of 70 or greater in adult [facility]) Starting trulicty, reviewed r/se/b. Plan to titrate up to saxenda (3mg  daily)  3. Type 2 diabetes mellitus with complication, without long-term current use of insulin (HCC) a1c at goal - Comprehensive metabolic panel - Ambulatory referral to Ophthalmology  4. Encounter for screening  mammogram for high-risk patient - MM DIGITAL SCREENING BILATERAL; Future - Ambulatory referral to Genetics  5. FHx: breast cancer - MM DIGITAL SCREENING BILATERAL; Future - Ambulatory referral to Genetics  6. COR (chronic cor pulmonale) ([place]) - DG Chest 2 View; Future  7. Edema, unspecified type - furosemide (LASIX) 40 MG tablet; Take 1.5 tablets (60 mg total) by mouth 2 (two) times daily.  Other orders - Dulaglutide (TRULICITY) 6.30 ZS/0.1UX SOPN; Inject 0.75 mg into the skin once a week.  Return in about 2 weeks (around 05/15/2018).    Rutherford Guys, MD Primary Care at [place], [place] [address] Ph.  [phone] Fax [phone]

## 2018-05-08 ENCOUNTER — Telehealth: Payer: Self-pay | Admitting: Family Medicine

## 2018-05-08 DIAGNOSIS — I2699 Other pulmonary embolism without acute cor pulmonale: Secondary | ICD-10-CM | POA: Diagnosis not present

## 2018-05-08 DIAGNOSIS — I272 Pulmonary hypertension, unspecified: Secondary | ICD-10-CM | POA: Diagnosis not present

## 2018-05-08 DIAGNOSIS — J9601 Acute respiratory failure with hypoxia: Secondary | ICD-10-CM | POA: Diagnosis not present

## 2018-05-08 NOTE — Telephone Encounter (Signed)
LVM for pt to call the office and schedule an appt. Return in about 2 weeks (around 05/15/2018). Are the notes Dr. Pamella Pert left fo rpt.   When pt calls back, you will need to call [place] and ask for Carolynne Edouard or [place]. We will need to open a sameday.  Thank you!

## 2018-05-15 ENCOUNTER — Encounter: Payer: Self-pay | Admitting: Genetic Counselor

## 2018-05-15 ENCOUNTER — Telehealth: Payer: Self-pay | Admitting: Genetic Counselor

## 2018-05-15 NOTE — Telephone Encounter (Signed)
A genetic counseling appt has been scheduled for the pt to see Roma Kayser on 3/11 at 2pm. Letter mailed to the pt.

## 2018-05-23 ENCOUNTER — Ambulatory Visit (INDEPENDENT_AMBULATORY_CARE_PROVIDER_SITE_OTHER): Payer: 59 | Admitting: *Deleted

## 2018-05-23 DIAGNOSIS — Z7901 Long term (current) use of anticoagulants: Secondary | ICD-10-CM

## 2018-05-23 LAB — POCT INR: INR: 2.4 (ref 2.0–3.0)

## 2018-05-23 NOTE — Patient Instructions (Signed)
Description   Continue with 2 tablets daily except 1.5 tablets each Monday, Wednesday and Friday. Repeat INR in 4 weeks

## 2018-05-28 ENCOUNTER — Other Ambulatory Visit: Payer: Self-pay | Admitting: Family Medicine

## 2018-05-28 ENCOUNTER — Other Ambulatory Visit: Payer: Self-pay | Admitting: Cardiovascular Disease

## 2018-05-28 DIAGNOSIS — I2699 Other pulmonary embolism without acute cor pulmonale: Secondary | ICD-10-CM

## 2018-05-28 DIAGNOSIS — Z7901 Long term (current) use of anticoagulants: Secondary | ICD-10-CM

## 2018-05-28 NOTE — Telephone Encounter (Signed)
Requested Prescriptions  Pending Prescriptions Disp Refills  . gabapentin (NEURONTIN) 300 MG capsule [Pharmacy Med Name: GABAPENTIN 300MG  CAPSULES] 60 capsule 0    Sig: TAKE 1 CAPSULE(300 MG) BY MOUTH TWICE DAILY     Neurology: Anticonvulsants - gabapentin Passed - 05/28/2018  1:48 PM      Passed - Valid encounter within last 12 months    Recent Outpatient Visits          3 weeks ago Benign essential HTN   Primary Care at [facility], Lilia Argue, MD   3 months ago Type 2 diabetes mellitus with complication, without long-term current use of insulin [facility])   [facility] at [facility], Lilia Argue, MD   6 months ago Irregular menses   Primary Care at [facility], Lilia Argue, MD   10 months ago 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

## 2018-06-04 ENCOUNTER — Inpatient Hospital Stay: Payer: 59 | Attending: Genetic Counselor | Admitting: Genetic Counselor

## 2018-06-04 ENCOUNTER — Inpatient Hospital Stay: Payer: 59

## 2018-06-06 ENCOUNTER — Encounter: Payer: Self-pay | Admitting: Genetic Counselor

## 2018-06-06 DIAGNOSIS — I272 Pulmonary hypertension, unspecified: Secondary | ICD-10-CM | POA: Diagnosis not present

## 2018-06-06 DIAGNOSIS — I2699 Other pulmonary embolism without acute cor pulmonale: Secondary | ICD-10-CM | POA: Diagnosis not present

## 2018-06-06 DIAGNOSIS — J9601 Acute respiratory failure with hypoxia: Secondary | ICD-10-CM | POA: Diagnosis not present

## 2018-06-10 ENCOUNTER — Telehealth: Payer: Self-pay

## 2018-06-10 NOTE — Progress Notes (Deleted)
Cardiology Office Note   Date:  06/10/2018   ID:  KEALA DRUM, DOB Jan 11, 1976, MRN [id number]  PCP:  Rutherford Guys, MD  Cardiologist:  Dr. Quay Burow, MD  No chief complaint on file.    History of Present Illness: Alexa [name] is a 43 y.o. female who presents for follow up, seen for Dr. Gwenlyn Found.   Alexa [name] has a prior hx of chronic right sided heart failure, hx of submassive PE on Coumadin (2018), OSA, HTN, DM and obesity. She was last seen by her primary cardiologist on 08/02/2017 in follow up and was about at her baseline in terms of volume status.   Today she   (obstructive sleep apnea) History of of obstructive sleep apnea having recently undergone home sleep study the results of which are still pending  Benign essential HTN History of essential hypertension on losartan in the past currently not on antihypertensive medications with blood pressure measured in the office of 135/79.  Class 3 obesity due to excess calories with serious comorbidity and body mass index (BMI) greater than or equal to 70 in adult History of morbid obesity with complications of that trying to lose weight we did discuss the possibility of bariatric surgery which she currently is not interested in  Acute pulmonary embolus (HCC) History of submassive acute pulmonary embolus February 2008 on Coumadin anticoagulation.  Acute on chronic right-sided heart failure (HCC) Acute on chronic right-sided heart failure with cor pulmonale pulmonary pressures in the 80 range probably from a combination of obesity and prior pulmonary embolus.  She is on oral diuretics.   Past Medical History:  Diagnosis Date  . CHF (congestive heart failure) ([place])   . DM2 (diabetes mellitus, type 2) ([place])   . Hypertension   . Nocturnal hypoxia   . Obstructive sleep apnea syndrome, moderate   . Pulmonary embolism ([place]) 05/14/2016   submassive, on chronic anticoagulation  . Pulmonary hypertension ([place])      Past Surgical History:  Procedure Laterality Date  . CESAREAN SECTION       Current Outpatient Medications  Medication Sig Dispense Refill  . acetaminophen (TYLENOL) 500 MG tablet Take 1,000 mg by mouth daily as needed for mild pain.    . cetirizine (ZYRTEC) 10 MG tablet Take 1 tablet (10 mg total) by mouth daily. 30 tablet 1  . Dulaglutide (TRULICITY) 3.61 WE/3.1VQ SOPN Inject 0.75 mg into the skin once a week. 4 pen 5  . ferrous sulfate 325 (65 FE) MG tablet Take 325 mg by mouth 2 (two) times daily with a meal.     . furosemide (LASIX) 40 MG tablet Take 1.5 tablets (60 mg total) by mouth 2 (two) times daily. 90 tablet 0  . gabapentin (NEURONTIN) 300 MG capsule TAKE 1 CAPSULE(300 MG) BY MOUTH TWICE DAILY 60 capsule 0  . glucose blood test strip ascensia  Meter/strips preferred Test daily Use as instructed Dx.dm 100 each 3  . Lancets (ONETOUCH ULTRASOFT) lancets Test daily Use as instructed dm2 100 each 12  . metFORMIN (GLUCOPHAGE) 500 MG tablet TAKE 1 TABLET BY MOUTH TWICE DAILY WITH MEALS 180 tablet 0  . Misc. Devices MISC Bariatric size shower chair with handle 423 lbs, BMI 72  Dx. Pulmonary HTN, DM2 with neuropathy, morbid obesity 1 each 0  . warfarin (COUMADIN) 7.5 MG tablet TAKE 1 AND 1/2 TO 2 TABLETS BY MOUTH DAILY. FOLLOW UP BEFORE ADDITIONAL REFILL AUTHORIZATION 60 tablet 0   Current Facility-Administered Medications  Medication  Dose Route Frequency Provider Last Rate Last Dose  . cyanocobalamin ((VITAMIN B-12)) injection 1,000 mcg  1,000 mcg Intramuscular Q30 days English, [name] D, [place]   1,000 mcg at 04/04/17 1625    Allergies:   Penicillins    Social History:  The patient  reports that she has quit smoking. Her smoking use included cigarettes. She has never used smokeless tobacco. She reports current alcohol use of about 4.0 - 6.0 standard drinks of alcohol per week. She reports that she does not use drugs.   Family History:  The patient's ***family history  includes Cancer in her maternal grandmother; Cancer (age of onset: 80) in her maternal aunt and maternal aunt; Diabetes in her maternal grandmother; Hypertension in her mother.    ROS:  Please see the history of present illness.   Otherwise, review of systems are positive for {NONE DEFAULTED:18576::"none"}.   All other systems are reviewed and negative.    PHYSICAL EXAM: VS:  There were no vitals taken for this visit. , BMI There is no height or weight on file to calculate BMI. GEN: Well nourished, well developed, in no acute distress HEENT: normal Neck: no JVD, carotid bruits, or masses Cardiac: ***RRR; no murmurs, rubs, or gallops,no edema  Respiratory:  clear to auscultation bilaterally, normal work of breathing GI: soft, nontender, nondistended, + BS MS: no deformity or atrophy Skin: warm and dry, no rash Neuro:  Strength and sensation are intact Psych: euthymic mood, full affect   EKG:  EKG {ACTION; IS/IS GXQ:11941740} ordered today. The ekg ordered today demonstrates ***   Recent Labs: 06/26/2017: B Natriuretic Peptide 136.0 06/27/2017: Hemoglobin 13.5; Platelets 404 02/19/2018: TSH 1.520 05/01/2018: ALT 12; BUN 10; Creatinine, Ser 0.80; Potassium 4.8; Sodium 140    Lipid Panel    Component Value Date/Time   CHOL 159 02/19/2018 1520   TRIG 78 02/19/2018 1520   HDL 58 02/19/2018 1520   CHOLHDL 2.7 02/19/2018 1520   CHOLHDL 4.6 01/28/2016 1626   VLDL 17 01/28/2016 1626   LDLCALC 85 02/19/2018 1520   LDLDIRECT 100 10/08/2015 1505      Wt Readings from Last 3 Encounters:  05/01/18 (!) 448 lb (203.2 kg)  02/19/18 (!) 447 lb 12.8 oz (203.1 kg)  11/13/17 (!) 429 lb 12.8 oz (195 kg)      Other studies Reviewed: Additional studies/ records that were reviewed today include:   Echocardiogram 06/11/2017: Study Conclusions  - Left ventricle: The cavity size was normal. There was mild   concentric hypertrophy. Systolic function was hyperdynamic. The   estimated ejection  fraction was in the range of 70% to 75%. There   was dynamic obstruction. There was dynamic obstruction at restin   the mid cavity, with a peak velocity of 250 cm/sec and a peak   gradient of 25 mm Hg. Wall motion was normal; there were no   regional wall motion abnormalities. Left ventricular diastolic   function parameters were normal. - Ventricular septum: The contour showed diastolic flattening and   systolic flattening. These changes are consistent with RV volume   and pressure overload. - Right ventricle: The cavity size was moderately dilated. Wall   thickness was normal. - Right atrium: The atrium was moderately dilated. - Tricuspid valve: There was moderate-severe regurgitation directed   centrally. - Pulmonary arteries: Systolic pressure was severely increased. PA   peak pressure: 80 mm Hg (S).  Impressions:  - Findings suggest chronic cor pulmonale, but superimposed acute   event (e.g. pulmonary embolism,  acute hypoxic respiratory   failure, etc.) is likely present. Right heart overload findings   have worsened ince the precious study.  Vascular US 06/11/2017: Final Interpretation: Left: Unable to adequately evaluate any of the left lower extremity veins due to technical limitations including patient body habitus, depth of vessels, and limitations of ultrasound penetration. Therefore, this exam is inconclusive.  ASSESSMENT AND PLAN:  1.  ***   Current medicines are reviewed at length with the patient today.  The patient {ACTIONS; HAS/DOES NOT HAVE:19233} concerns regarding medicines.  The following changes have been made:  {PLAN; NO CHANGE:13088:s}  Labs/ tests ordered today include: *** No orders of the defined types were placed in this encounter.    Disposition:   FU with *** in {gen number 0-10:932355} {Days to years:10300}  Signed, Kathyrn Drown, NP  06/10/2018 11:52 AM    [facility] [address], [place], [place]   [address] Phone: [phone]; Fax: [phone]

## 2018-06-10 NOTE — Telephone Encounter (Signed)
.  [email or web address]  

## 2018-06-11 ENCOUNTER — Ambulatory Visit: Payer: 59 | Admitting: Cardiology

## 2018-06-11 ENCOUNTER — Other Ambulatory Visit: Payer: Self-pay

## 2018-06-11 ENCOUNTER — Ambulatory Visit: Payer: 59 | Admitting: Family Medicine

## 2018-06-11 ENCOUNTER — Ambulatory Visit
Admission: RE | Admit: 2018-06-11 | Discharge: 2018-06-11 | Disposition: A | Discharge status: Home / Self Care | Payer: 59 | Source: Ambulatory Visit | Attending: Family Medicine | Admitting: Family Medicine

## 2018-06-11 ENCOUNTER — Encounter: Payer: Self-pay | Admitting: Family Medicine

## 2018-06-11 VITALS — BP 138/76 | HR 102 | Temp 98.1°F | Resp 18 | Ht 64.0 in | Wt >= 6400 oz

## 2018-06-11 DIAGNOSIS — Z803 Family history of malignant neoplasm of breast: Secondary | ICD-10-CM

## 2018-06-11 DIAGNOSIS — Z1231 Encounter for screening mammogram for malignant neoplasm of breast: Secondary | ICD-10-CM | POA: Diagnosis not present

## 2018-06-11 DIAGNOSIS — I2781 Cor pulmonale (chronic): Secondary | ICD-10-CM

## 2018-06-11 DIAGNOSIS — R609 Edema, unspecified: Secondary | ICD-10-CM | POA: Diagnosis not present

## 2018-06-11 MED ORDER — POTASSIUM CHLORIDE CRYS ER 10 MEQ PO TBCR: 10.0000 meq | EXTENDED_RELEASE_TABLET | Freq: Two times a day (BID) | ORAL | 3 refills | Status: DC

## 2018-06-11 MED ORDER — FUROSEMIDE 40 MG PO TABS: 60.0000 mg | ORAL_TABLET | Freq: Two times a day (BID) | ORAL | 0 refills | Status: DC

## 2018-06-11 NOTE — Patient Instructions (Signed)
° ° ° °  If you have lab work done today you will be contacted with your lab results within the next 2 weeks.  If you have not heard from us then please contact us. The fastest way to get your results is to register for My Chart. ° ° °IF you received an x-ray today, you will receive an invoice from [facility]. Please contact [facility] at [phone] with questions or concerns regarding your invoice.  ° °IF you received labwork today, you will receive an invoice from [facility]. Please contact [facility] at [phone] with questions or concerns regarding your invoice.  ° °Our billing staff will not be able to assist you with questions regarding bills from these companies. ° °You will be contacted with the lab results as soon as they are available. The fastest way to get your results is to activate your My Chart account. Instructions are located on the last page of this paperwork. If you have not heard from us regarding the results in 2 weeks, please contact this office. °  ° ° ° °

## 2018-06-11 NOTE — Progress Notes (Signed)
3/18/20202:37 PM  Alexa [name] [birth year], 43 y.o. female [id number]  Chief Complaint  Patient presents with  . Medication Refill    follow up on furosemide     HPI:   Patient is a 43 y.o. female with past medical history significant for DM2, Cor pulmonale, OSA untreated, pHTN, on long term anticoagulation who present for followup  Last OV feb 2020 Increased lasix to 60mg  BID due to fluid overload Breathing and edema is back to baseline Urinating a lot more Monitoring fluid intake Sees cards in April  Started on trulicity last OV, has noticed decreased appettite Tolerating it well Denies any low cbgs  Lab Results  Component Value Date   HGBA1C 6.1 (A) 02/19/2018   Lab Results  Component Value Date   CREATININE 0.80 05/01/2018   BUN 10 05/01/2018   NA 140 05/01/2018   K 4.8 05/01/2018   CL 98 05/01/2018   CO2 28 05/01/2018    Fall Risk  06/11/2018 05/01/2018 02/19/2018 11/13/2017 07/30/2017  Falls in the past year? 0 0 0 No No  Number falls in past yr: - 0 - - -  Injury with Fall? - 1 - - -  Follow up Falls evaluation completed - - - -     Depression screen [facility] 2/9 06/11/2018 05/01/2018 02/19/2018  Decreased Interest 0 0 0  Down, Depressed, Hopeless 0 0 0  PHQ - 2 Score 0 0 0  Altered sleeping - - -  Tired, decreased energy - - -  Change in appetite - - -  Feeling bad or failure about yourself  - - -  Trouble concentrating - - -  Moving slowly or fidgety/restless - - -  Suicidal thoughts - - -  PHQ-9 Score - - -  Difficult doing work/chores - - -    Allergies  Allergen Reactions  . Penicillins Hives    Has patient had a PCN reaction causing immediate rash, facial/tongue/throat swelling, SOB or lightheadedness with hypotension: No Has patient had a PCN reaction causing severe rash involving mucus membranes or skin necrosis: No Has patient had a PCN reaction that required hospitalization No Has patient had a PCN reaction occurring within the last 10  years: No If all of the above answers are "NO", then may proceed with Cephalosporin use.    Prior to Admission medications   Medication Sig Start Date End Date Taking? Authorizing Provider  acetaminophen (TYLENOL) 500 MG tablet Take 1,000 mg by mouth daily as needed for mild pain.   Yes [provider]  cetirizine (ZYRTEC) 10 MG tablet Take 1 tablet (10 mg total) by mouth daily. 12/11/16  Yes English, Colletta [place] D, PA  Dulaglutide (TRULICITY) 1.02 VO/5.3GU SOPN Inject 0.75 mg into the skin once a week. 05/01/18  Yes Rutherford Guys, MD  ferrous sulfate 325 (65 FE) MG tablet Take 325 mg by mouth 2 (two) times daily with a meal.    Yes [provider]  furosemide (LASIX) 40 MG tablet Take 1.5 tablets (60 mg total) by mouth 2 (two) times daily. 05/01/18  Yes Rutherford Guys, MD  gabapentin (NEURONTIN) 300 MG capsule TAKE 1 CAPSULE(300 MG) BY MOUTH TWICE DAILY 05/28/18  Yes Grant Fontana M, MD  glucose blood test strip ascensia  Meter/strips preferred Test daily Use as instructed Dx.dm 06/23/16  Yes English, Colletta [place] D, PA  Lancets [facility] ULTRASOFT) lancets Test daily Use as instructed dm2 06/22/16  Yes English, Colletta [place] D, PA  metFORMIN (GLUCOPHAGE) 500 MG tablet  TAKE 1 TABLET BY MOUTH TWICE DAILY WITH MEALS 04/28/18  Yes Sagardia, Ines Bloomer, MD  Misc. Devices MISC Bariatric size shower chair with handle 423 lbs, BMI 72  Dx. Pulmonary HTN, DM2 with neuropathy, morbid obesity 11/13/17  Yes Rutherford Guys, MD  warfarin (COUMADIN) 7.5 MG tablet TAKE 1 AND 1/2 TO 2 TABLETS BY MOUTH DAILY. FOLLOW UP BEFORE ADDITIONAL REFILL AUTHORIZATION 05/28/18  Yes Lorretta Harp, MD    Past Medical History:  Diagnosis Date  . CHF (congestive heart failure) ([place])   . DM2 (diabetes mellitus, type 2) ([place])   . Hypertension   . Nocturnal hypoxia   . Obstructive sleep apnea syndrome, moderate   . Pulmonary embolism ([place]) 05/14/2016   submassive, on chronic anticoagulation  . Pulmonary  hypertension ([place])     Past Surgical History:  Procedure Laterality Date  . CESAREAN SECTION      Social History   Tobacco Use  . Smoking status: Former Smoker    Types: Cigarettes  . Smokeless tobacco: Never Used  . Tobacco comment: only smokes when she drinks alcohol - 1 pack per week  Substance Use Topics  . Alcohol use: Yes    Alcohol/week: 4.0 - 6.0 standard drinks    Types: 4 - 6 Standard drinks or equivalent per week    Comment: mixed drinks 3-4 times/weeks    Family History  Problem Relation Age of Onset  . Diabetes Maternal Grandmother   . Cancer Maternal Grandmother        breast cancer   . Hypertension Mother   . Cancer Maternal Aunt 55       breast cancer   . Cancer Maternal Aunt 55       breast cancer     ROS Per hpi  OBJECTIVE:  Blood pressure 138/76, pulse (!) 102, temperature 98.1 F (36.7 C), temperature source Oral, resp. rate 18, height 5\' 4"  (1.626 m), weight (!) 440 lb (199.6 kg), last menstrual period 04/02/2018, SpO2 93 %. Body mass index is 75.53 kg/m.   Wt Readings from Last 3 Encounters:  06/11/18 (!) 440 lb (199.6 kg)  05/01/18 (!) 448 lb (203.2 kg)  02/19/18 (!) 447 lb 12.8 oz (203.1 kg)    Physical Exam Vitals signs and nursing note reviewed.  Constitutional:      Appearance: She is well-developed.  HENT:     Head: Normocephalic and atraumatic.     Mouth/Throat:     Pharynx: No oropharyngeal exudate.  Eyes:     General: No scleral icterus.    Conjunctiva/sclera: Conjunctivae normal.     Pupils: Pupils are equal, round, and reactive to light.  Neck:     Musculoskeletal: Neck supple.  Cardiovascular:     Rate and Rhythm: Normal rate and regular rhythm.     Heart sounds: Normal heart sounds. No murmur. No friction rub. No gallop.   Pulmonary:     Effort: Pulmonary effort is normal.     Breath sounds: Normal breath sounds. No wheezing or rales.  Skin:    General: Skin is warm and dry.  Neurological:     Mental Status:  She is alert and oriented to person, place, and time.     ASSESSMENT and PLAN  1. Edema, unspecified type 2. COR (chronic cor pulmonale) (HCC) Weight loss, improved edema. Breathing at baseline. Continue current regime - Basic Metabolic Panel - furosemide (LASIX) 40 MG tablet; Take 1.5 tablets (60 mg total) by mouth 2 (two) times daily. -  potassium chloride (K-DUR,KLOR-CON) 10 MEQ tablet; Take 1 tablet (10 mEq total) by mouth 2 (two) times daily.  Return in about 2 months (around 08/11/2018).    Rutherford Guys, MD [facility] [place], [place] [address] Ph.  [phone] Fax [phone]

## 2018-06-12 ENCOUNTER — Other Ambulatory Visit: Payer: Self-pay | Admitting: Family Medicine

## 2018-06-12 DIAGNOSIS — H35033 Hypertensive retinopathy, bilateral: Secondary | ICD-10-CM | POA: Diagnosis not present

## 2018-06-12 DIAGNOSIS — H40013 Open angle with borderline findings, low risk, bilateral: Secondary | ICD-10-CM | POA: Diagnosis not present

## 2018-06-12 DIAGNOSIS — H40053 Ocular hypertension, bilateral: Secondary | ICD-10-CM | POA: Diagnosis not present

## 2018-06-12 DIAGNOSIS — R928 Other abnormal and inconclusive findings on diagnostic imaging of breast: Secondary | ICD-10-CM

## 2018-06-12 LAB — HM DIABETES EYE EXAM

## 2018-06-12 LAB — BASIC METABOLIC PANEL
BUN/Creatinine Ratio: 12 (ref 9–23)
BUN: 9 mg/dL (ref 6–24)
CO2: 27 mmol/L (ref 20–29)
Calcium: 9.1 mg/dL (ref 8.7–10.2)
Chloride: 99 mmol/L (ref 96–106)
Creatinine, Ser: 0.76 mg/dL (ref 0.57–1.00)
GFR calc Af Amer: 113 mL/min/{1.73_m2} (ref >59)
GFR calc non Af Amer: 98 mL/min/{1.73_m2} (ref >59)
Glucose: 79 mg/dL (ref 65–99)
Potassium: 4.2 mmol/L (ref 3.5–5.2)
Sodium: 141 mmol/L (ref 134–144)

## 2018-06-12 NOTE — Telephone Encounter (Signed)
Opened in error

## 2018-06-18 ENCOUNTER — Telehealth: Payer: Self-pay | Admitting: Family Medicine

## 2018-06-18 ENCOUNTER — Telehealth: Payer: Self-pay

## 2018-06-18 NOTE — Telephone Encounter (Signed)
Copied from [place] [id number]. Topic: General - Other >> Jun 18, 2018  1:22 PM [name], [place] C wrote: Reason for CRM: pt was seen by PCP. Pt says that she need a work note stating that she is okay to go back to work. Pt says that due to why she was seen she have to provide a work note (due to corona virus)    Fax: [phone]     Phone: [phone] - pt phone number

## 2018-06-18 NOTE — Telephone Encounter (Signed)

## 2018-06-18 NOTE — Telephone Encounter (Signed)
Copied from [place] [phone]. Topic: General - Other >> Jun 18, 2018  1:22 PM [name], [place] C wrote: Reason for CRM: pt was seen by PCP. Pt says that she need a work note stating that she is okay to go back to work. Pt says that due to why she was seen she have to provide a work note (due to corona virus)    Fax: [phone]     Phone: [phone] - pt phone number

## 2018-06-19 ENCOUNTER — Ambulatory Visit (INDEPENDENT_AMBULATORY_CARE_PROVIDER_SITE_OTHER): Payer: 59 | Admitting: Pharmacist

## 2018-06-19 ENCOUNTER — Other Ambulatory Visit: Payer: Self-pay

## 2018-06-19 DIAGNOSIS — Z86718 Personal history of other venous thrombosis and embolism: Secondary | ICD-10-CM

## 2018-06-19 DIAGNOSIS — Z7901 Long term (current) use of anticoagulants: Secondary | ICD-10-CM

## 2018-06-19 LAB — POCT INR: INR: 3 (ref 2.0–3.0)

## 2018-06-19 NOTE — Telephone Encounter (Signed)
Called pt and LVM to call office back about note.

## 2018-06-23 ENCOUNTER — Telehealth: Payer: Self-pay | Admitting: Family Medicine

## 2018-06-23 NOTE — Telephone Encounter (Signed)
Copied from [place] [phone]. Topic: General - Other >> Jun 23, 2018  2:33 PM Alexa [name] wrote: Reason for CRM: Patient needs a note that states that she is ok to return to work because she is high risk of Covid-19 because of her past respiratory issues. 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

## 2018-06-23 NOTE — Telephone Encounter (Signed)
Patient is following up about her letter-(patient informed that office did try to call her) best number to reach patient at is and she states she will be available at this number today -[phone]

## 2018-06-24 ENCOUNTER — Ambulatory Visit: Payer: Self-pay | Admitting: *Deleted

## 2018-06-24 NOTE — Telephone Encounter (Signed)
Pt calling regarding workplace letter. States she is high risk for COVID-19; obesity and DM and has been on O2 in the past. Works in child care. States she needs a letter from [employer] to clarify if she can work. HAs been on scheduled vacation and did return to work today. States 3rd request  Please advise: CB# [phone]

## 2018-06-25 NOTE — Telephone Encounter (Signed)
Spoke with pt about letter this morning and faxed to employer. Also has one up front for pick-up.

## 2018-06-25 NOTE — Telephone Encounter (Signed)
Spoke with [place].

## 2018-06-25 NOTE — Telephone Encounter (Signed)
Pt called back in back in to speak with [place], pt says that her employer received work note however they need the note to have today's date on it. (06/25/18)   Please update date and refax per pt.

## 2018-06-26 ENCOUNTER — Other Ambulatory Visit: Payer: Self-pay | Admitting: Family Medicine

## 2018-06-26 ENCOUNTER — Other Ambulatory Visit: Payer: Self-pay

## 2018-06-26 DIAGNOSIS — I2699 Other pulmonary embolism without acute cor pulmonale: Secondary | ICD-10-CM

## 2018-06-26 DIAGNOSIS — Z7901 Long term (current) use of anticoagulants: Secondary | ICD-10-CM

## 2018-06-26 MED ORDER — GABAPENTIN 300 MG PO CAPS: 300.0000 mg | ORAL_CAPSULE | Freq: Two times a day (BID) | ORAL | 0 refills | Status: DC

## 2018-06-26 MED ORDER — WARFARIN SODIUM 7.5 MG PO TABS: ORAL_TABLET | ORAL | 1 refills | Status: DC

## 2018-07-07 DIAGNOSIS — I272 Pulmonary hypertension, unspecified: Secondary | ICD-10-CM | POA: Diagnosis not present

## 2018-07-07 DIAGNOSIS — J9601 Acute respiratory failure with hypoxia: Secondary | ICD-10-CM | POA: Diagnosis not present

## 2018-07-07 DIAGNOSIS — I2699 Other pulmonary embolism without acute cor pulmonale: Secondary | ICD-10-CM | POA: Diagnosis not present

## 2018-07-15 ENCOUNTER — Telehealth: Payer: Self-pay

## 2018-07-15 ENCOUNTER — Telehealth: Payer: Self-pay | Admitting: Cardiovascular Disease

## 2018-07-15 ENCOUNTER — Encounter: Payer: Self-pay | Admitting: Cardiovascular Disease

## 2018-07-15 ENCOUNTER — Ambulatory Visit: Payer: 59 | Admitting: Cardiovascular Disease

## 2018-07-15 ENCOUNTER — Telehealth (INDEPENDENT_AMBULATORY_CARE_PROVIDER_SITE_OTHER): Payer: 59 | Admitting: Cardiovascular Disease

## 2018-07-15 DIAGNOSIS — Z6841 Body Mass Index (BMI) 40.0 and over, adult: Secondary | ICD-10-CM | POA: Diagnosis not present

## 2018-07-15 DIAGNOSIS — I5189 Other ill-defined heart diseases: Secondary | ICD-10-CM

## 2018-07-15 DIAGNOSIS — I2781 Cor pulmonale (chronic): Secondary | ICD-10-CM

## 2018-07-15 DIAGNOSIS — I10 Essential (primary) hypertension: Secondary | ICD-10-CM | POA: Diagnosis not present

## 2018-07-15 DIAGNOSIS — G4733 Obstructive sleep apnea (adult) (pediatric): Secondary | ICD-10-CM

## 2018-07-15 DIAGNOSIS — Z86711 Personal history of pulmonary embolism: Secondary | ICD-10-CM

## 2018-07-15 DIAGNOSIS — Z7901 Long term (current) use of anticoagulants: Secondary | ICD-10-CM

## 2018-07-15 NOTE — Telephone Encounter (Signed)
New Message   Pt is calling and wondering if someone will call and help her set up for her Virtual visit appt today   Please call

## 2018-07-15 NOTE — Telephone Encounter (Signed)
Patient called, advised that she would receive a call from the assistants to go over her medications and help instruct her on her visit.   Patient does not have a smart phone, advised she could do a telephone visit.  Patient will await the call for the visit.

## 2018-07-15 NOTE — Telephone Encounter (Signed)
Patient and/or DPR-approved person aware of AVS instructions and verbalized understanding. Letter including After Visit Summary and any other necessary documents mailed to the patient's address on file.  

## 2018-07-15 NOTE — Patient Instructions (Signed)
Medication Instructions:  Your physician recommends that you continue on your current medications as directed. Please refer to the Current Medication list given to you today.  If you need a refill on your cardiac medications before your next appointment, please call your pharmacy.   Lab work: NONE If you have labs (blood work) drawn today and your tests are completely normal, you will receive your results only by: . MyChart Message (if you have MyChart) OR . A paper copy in the mail If you have any lab test that is abnormal or we need to change your treatment, we will call you to review the results.  Testing/Procedures: NONE  Follow-Up: At [facility], you and your health needs are our priority.  As part of our continuing mission to provide you with exceptional heart care, we have created designated Provider Care Teams.  These Care Teams include your primary Cardiologist (physician) and Advanced Practice Providers (APPs -  Physician Assistants and Nurse Practitioners) who all work together to provide you with the care you need, when you need it. You will need a follow up appointment in 12 months.  Please call our office 2 months in advance to schedule this appointment.   

## 2018-07-15 NOTE — Progress Notes (Signed)
Virtual Visit via Telephone Note   This visit type was conducted due to national recommendations for restrictions regarding the COVID-19 Pandemic (e.g. social distancing) in an effort to limit this patient's exposure and mitigate transmission in our community.  Due to her co-morbid illnesses, this patient is at least at moderate risk for complications without adequate follow up.  This format is felt to be most appropriate for this patient at this time.  The patient did not have access to video technology/had technical difficulties with video requiring transitioning to audio format only (telephone).  All issues noted in this document were discussed and addressed.  No physical exam could be performed with this format.  Please refer to the patient's chart for her  consent to telehealth for [facility].   Evaluation Performed:  Follow-up visit  Date:  07/15/2018   ID:  Alexa [name], DOB 03-28-1975, MRN [id number]  Patient Location: Home Provider Location: Office  PCP:  Rutherford Guys, MD  Cardiologist:  Quay Burow, MD  Electrophysiologist:  None   Chief Complaint: 1 year follow-up right heart failure  History of Present Illness:    Alexa [name] is a 43 y.o. morbidly overweight divorced African-American female mother of 2 young children who works in [employer].  She was hospitalized for acute on chronic right-sided heart failure with volume overload and was diuresed.  She did have a history of submassive pulmonary embolus February 2018 currently on Coumadin anticoagulation.  Other problems include diabetes and hypertension.  She walks with a cane.  Her edema is about at baseline.  Since I saw her a year ago she is remained stable.  She has lost quite a bit of weight down to 412 pounds as result of diet and exercise.  Her lower extremity edema has improved as well.  She is aware of salt restriction.  She denies chest pain or shortness of breath.  The patient does not have symptoms  concerning for COVID-19 infection (fever, chills, cough, or new shortness of breath).    Past Medical History:  Diagnosis Date  . CHF (congestive heart failure) ([place])   . DM2 (diabetes mellitus, type 2) ([place])   . Hypertension   . Nocturnal hypoxia   . Obstructive sleep apnea syndrome, moderate   . Pulmonary embolism ([place]) 05/14/2016   submassive, on chronic anticoagulation  . Pulmonary hypertension ([place])    Past Surgical History:  Procedure Laterality Date  . CESAREAN SECTION       Current Meds  Medication Sig  . acetaminophen (TYLENOL) 500 MG tablet Take 1,000 mg by mouth daily as needed for mild pain.  . cetirizine (ZYRTEC) 10 MG tablet Take 1 tablet (10 mg total) by mouth daily.  . Dulaglutide (TRULICITY) 5.63 OV/5.6EP SOPN Inject 0.75 mg into the skin once a week.  . furosemide (LASIX) 40 MG tablet Take 1.5 tablets (60 mg total) by mouth 2 (two) times daily.  Marland Kitchen gabapentin (NEURONTIN) 300 MG capsule Take 1 capsule (300 mg total) by mouth 2 (two) times daily.  Marland Kitchen glucose blood test strip ascensia  Meter/strips preferred Test daily Use as instructed Dx.dm  . Lancets (ONETOUCH ULTRASOFT) lancets Test daily Use as instructed dm2  . metFORMIN (GLUCOPHAGE) 500 MG tablet TAKE 1 TABLET BY MOUTH TWICE DAILY WITH MEALS  . Misc. Devices MISC Bariatric size shower chair with handle 423 lbs, BMI 72  Dx. Pulmonary HTN, DM2 with neuropathy, morbid obesity  . potassium chloride (K-DUR,KLOR-CON) 10 MEQ tablet Take 1 tablet (10  mEq total) by mouth 2 (two) times daily.  Marland Kitchen warfarin (COUMADIN) 7.5 MG tablet Take 1.5 to 2 tablets daily as directed by coumadin clinic     Allergies:   Penicillins   Social History   Tobacco Use  . Smoking status: Former Smoker    Types: Cigarettes  . Smokeless tobacco: Never Used  . Tobacco comment: only smokes when she drinks alcohol - 1 pack per week  Substance Use Topics  . Alcohol use: Yes    Alcohol/week: 4.0 - 6.0 standard drinks    Types: 4 - 6  Standard drinks or equivalent per week    Comment: mixed drinks 3-4 times/weeks  . Drug use: No     Family Hx: The patient's family history includes Cancer in her maternal grandmother; Cancer (age of onset: 9) in her maternal aunt and maternal aunt; Diabetes in her maternal grandmother; Hypertension in her mother.  ROS:   Please see the history of present illness.     All other systems reviewed and are negative.   Prior CV studies:   The following studies were reviewed today:  None  Labs/Other Tests and Data Reviewed:    EKG:  No ECG reviewed.  Recent Labs: 02/19/2018: TSH 1.520 05/01/2018: ALT 12 06/11/2018: BUN 9; Creatinine, Ser 0.76; Potassium 4.2; Sodium 141   Recent Lipid Panel Lab Results  Component Value Date/Time   CHOL 159 02/19/2018 03:20 PM   TRIG 78 02/19/2018 03:20 PM   HDL 58 02/19/2018 03:20 PM   CHOLHDL 2.7 02/19/2018 03:20 PM   CHOLHDL 4.6 01/28/2016 04:26 PM   LDLCALC 85 02/19/2018 03:20 PM   LDLDIRECT 100 10/08/2015 03:05 PM    Wt Readings from Last 3 Encounters:  07/15/18 (!) 412 lb (186.9 kg)  06/11/18 (!) 440 lb (199.6 kg)  05/01/18 (!) 448 lb (203.2 kg)     Objective:    Vital Signs:  Ht 5\' 4"  (1.626 m)   Wt (!) 412 lb (186.9 kg)   BMI 70.72 kg/m    GEN:  no acute distress RESPIRATORY:  normal respiratory effort, symmetric expansion NEURO:  alert and oriented x 3, no obvious focal deficit PSYCH:  normal affect  ASSESSMENT & PLAN:    1. Essential hypertension history of essential hypertension 2. Obesity she is lost 25 to 30 pounds as result of diet and exercise but is not interested in bariatric surgery. 3.   Pulmonary embolus with chronic right-sided heart failure- on Coumadin anticoagulation checked here in our office  COVID-19 Education: The signs and symptoms of COVID-19 were discussed with the patient and how to seek care for testing (follow up with PCP or arrange E-visit).  The importance of social distancing was discussed  today.  Time:   Today, I have spent 6 minutes with the patient with telehealth technology discussing the above problems.     Medication Adjustments/Labs and Tests Ordered: Current medicines are reviewed at length with the patient today.  Concerns regarding medicines are outlined above.   Tests Ordered: No orders of the defined types were placed in this encounter.   Medication Changes: No orders of the defined types were placed in this encounter.   Disposition:  Follow up in 1 year(s)  Signed, Quay Burow, MD  07/15/2018 3:17 PM    [facility]

## 2018-07-17 ENCOUNTER — Ambulatory Visit
Admission: RE | Admit: 2018-07-17 | Discharge: 2018-07-17 | Disposition: A | Discharge status: Home / Self Care | Payer: 59 | Source: Ambulatory Visit | Attending: Family Medicine | Admitting: Family Medicine

## 2018-07-17 ENCOUNTER — Other Ambulatory Visit: Payer: Self-pay

## 2018-07-17 ENCOUNTER — Other Ambulatory Visit: Payer: Self-pay | Admitting: Family Medicine

## 2018-07-17 DIAGNOSIS — N6320 Unspecified lump in the left breast, unspecified quadrant: Secondary | ICD-10-CM | POA: Diagnosis not present

## 2018-07-17 DIAGNOSIS — N63 Unspecified lump in unspecified breast: Secondary | ICD-10-CM

## 2018-07-17 DIAGNOSIS — R928 Other abnormal and inconclusive findings on diagnostic imaging of breast: Secondary | ICD-10-CM

## 2018-07-17 DIAGNOSIS — N6310 Unspecified lump in the right breast, unspecified quadrant: Secondary | ICD-10-CM | POA: Diagnosis not present

## 2018-07-19 IMAGING — CT CT CTA ABD/PEL W/CM AND/OR W/O CM
2 of 6 series · 15 of 46 positions shown, 17 images · IV contrast (isovue)
Comparison: CT chest from earlier the same day

CLINICAL DATA: 5 day history of shortness of breath that began on
05/10/2015. The patient was initially hypoxemic with oxygen
saturation of 78% on room air. She was stabilized on 3 L with oxygen
saturation 98%. Chest x-ray showed increased interstitial markings
without distinct infiltrate. CT angiogram of the chest was obtained
and showed pulmonary emboli in the distal right pulmonary artery and
segmental emboli in the right upper lobe and right lower lobe
segmental arteries. In addition, thrombus was noted in the patient's
descending thoracic aorta. Thrombus in the distal descending
thoracic aorta and proximalabdominal aorta, incompletely visualized.
The patient was started on heparin drip. The patient remained
hemodynamically stable with heart rate in the upper 90s. BMP showed
potassium 3.1. WBC was 15.0. EKG shows sinus tachycardia with
nonspecific T-wave changes and right axis deviation. PCCM was
consulted because there was evidence of possible right heart strain

EXAM:
CTA ABDOMEN AND PELVIS WITH CONTRAST
TECHNIQUE: Multidetector CT imaging of the abdomen and pelvis was performed
using the standard protocol during bolus administration of
intravenous contrast. Multiplanar reconstructed images and MIPs were
obtained and reviewed to evaluate the vascular anatomy.
CONTRAST:  100 mL Isovue 370 IV

[Series 4: axial arterial · axial · arterial · 0.98mm/px · z∈[-653,-197]mm · 12 of 168 slices shown, 14 images]
[im 8/168  soft-tissue]
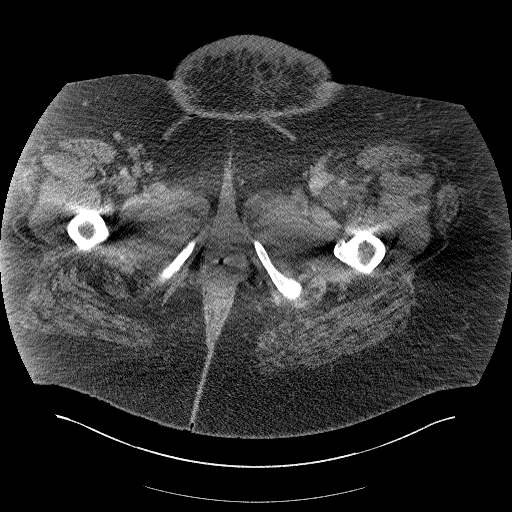
[im 8/168  bone]
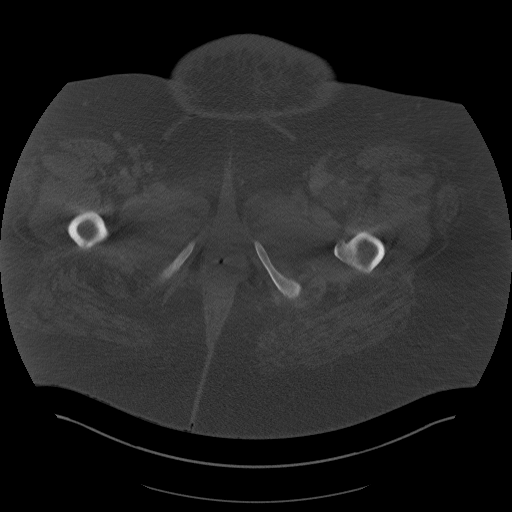
[im 23/168  soft-tissue]
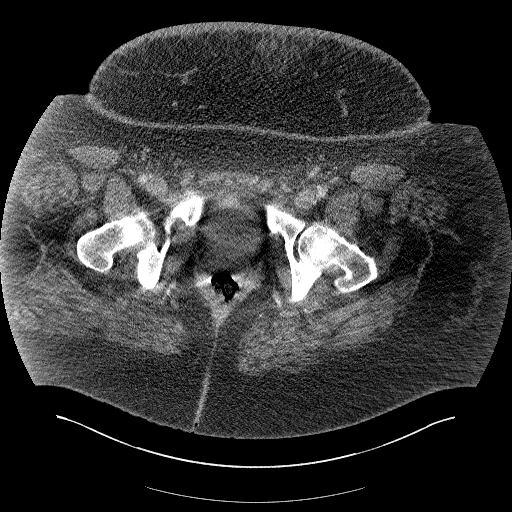
[im 38/168  soft-tissue]
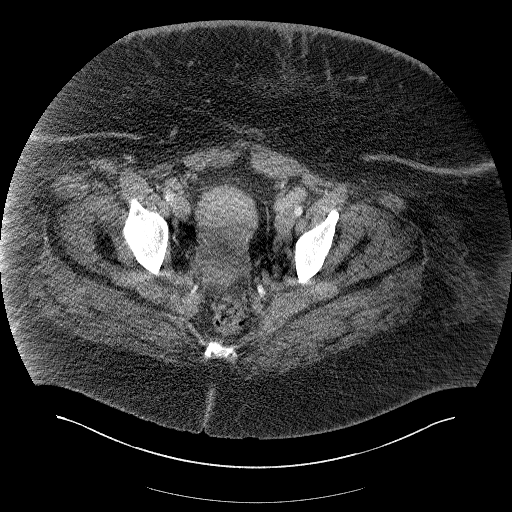
[im 54/168  soft-tissue]
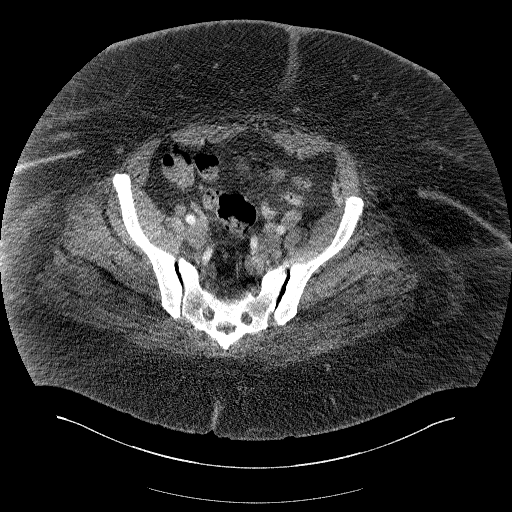
[im 61/168  soft-tissue]
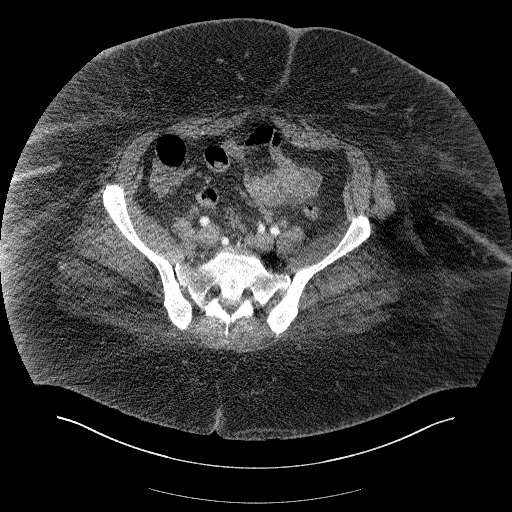
[im 76/168  soft-tissue]
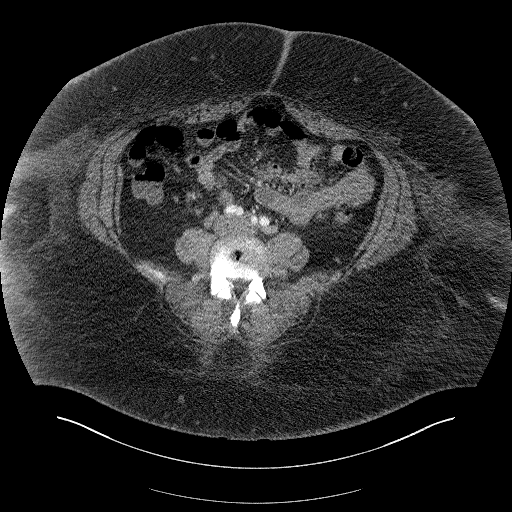
[im 92/168  soft-tissue]
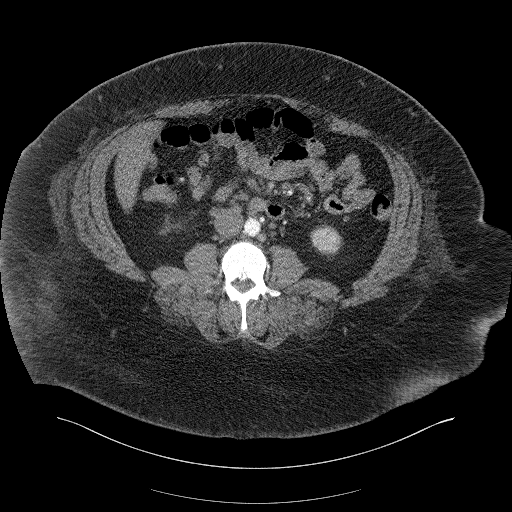
[im 107/168  soft-tissue]
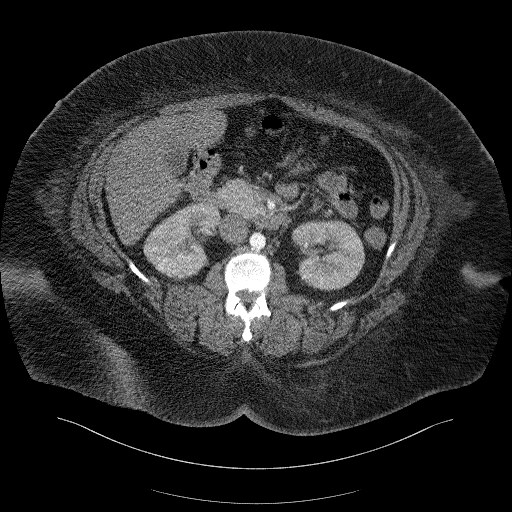
[im 114/168  soft-tissue]
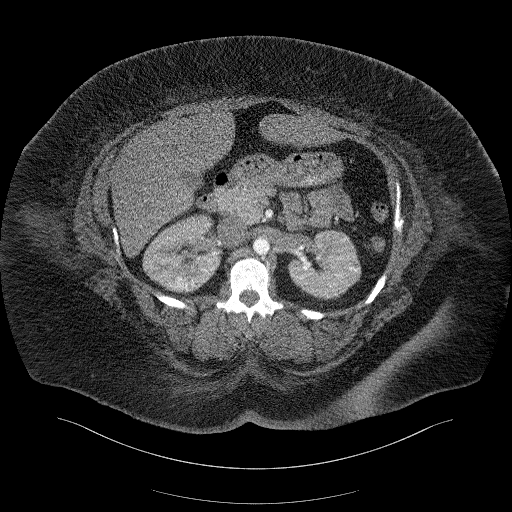
[im 114/168  bone]
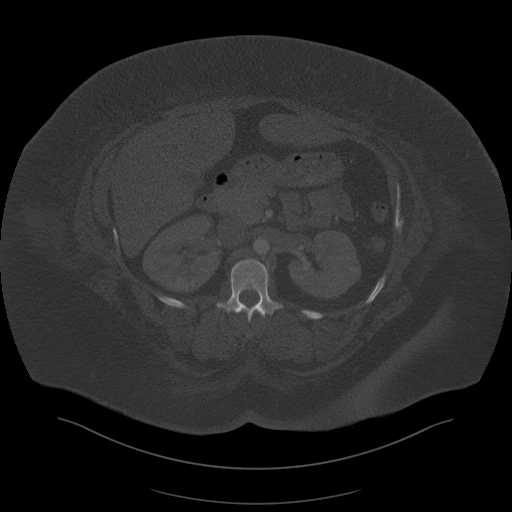
[im 130/168  soft-tissue]
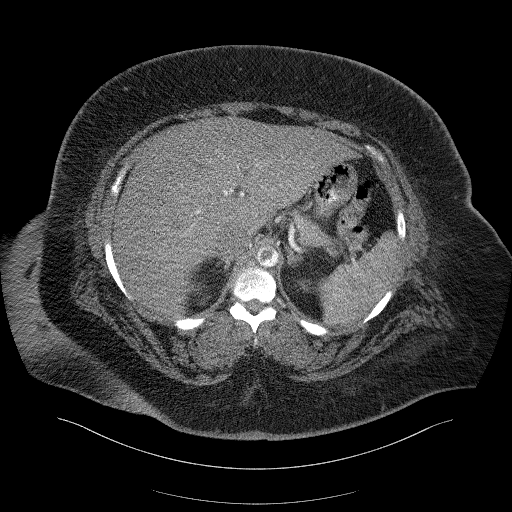
[im 145/168  soft-tissue]
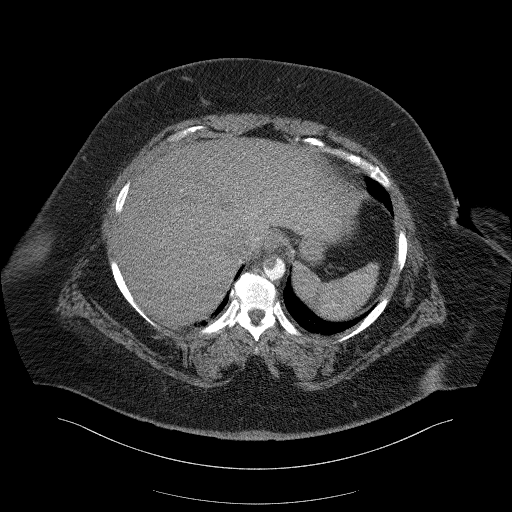
[im 160/168  soft-tissue]
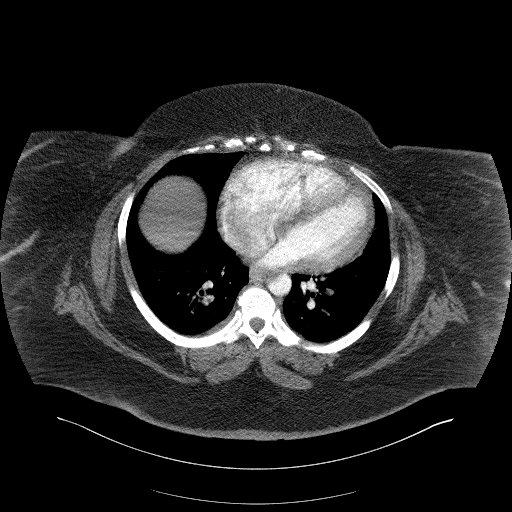

[Series 7: coronal · coronal · 1.03mm/px · 3 of 133 slices shown]
[im 34/133  soft-tissue]
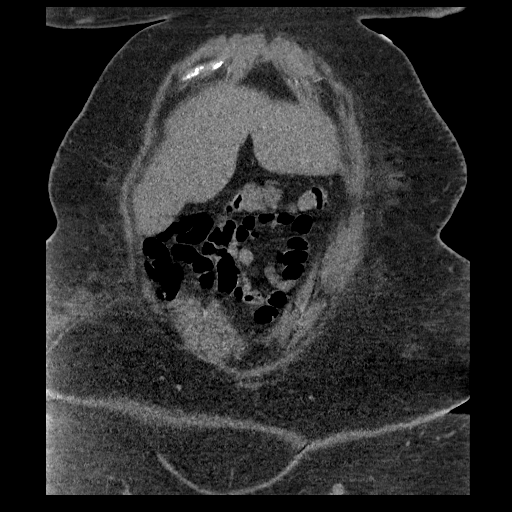
[im 67/133  soft-tissue]
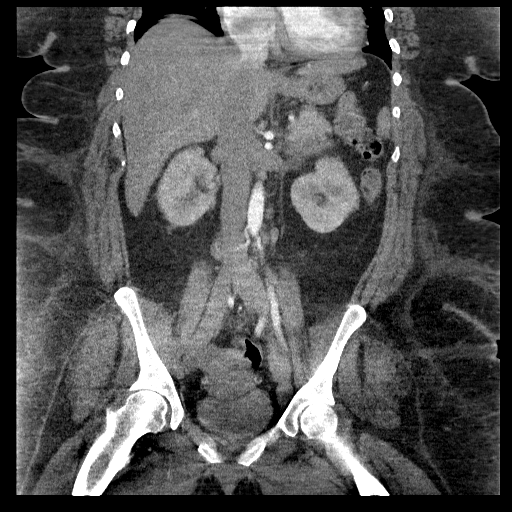
[im 100/133  soft-tissue]
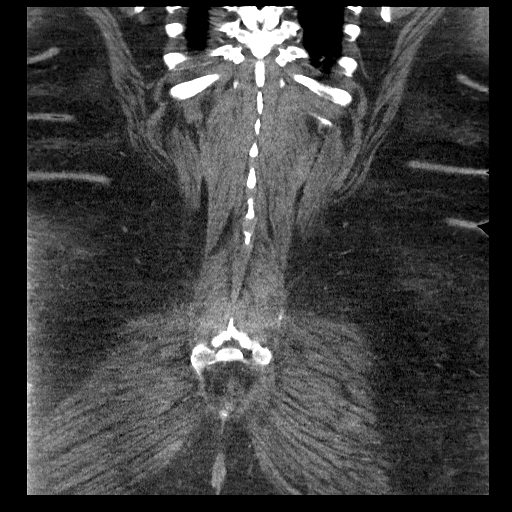

[15 of 46 positions shown; findings below may reference images not displayed]

FINDINGS: VASCULAR

Aorta: Intraluminal thrombus in the visualized distal descending
thoracic aorta protruding centrally down the lumen of the proximal
abdominal aorta, terminating just above the origin of the celiac
axis. The more distal abdominal aorta is unremarkable. No
dissection, aneurysm, or stenosis.

Celiac: Patent without evidence of aneurysm, dissection, vasculitis
or significant stenosis. Distal branching unremarkable.

SMA: Patent without evidence of aneurysm, dissection, vasculitis or
significant stenosis.

Renals: Both renal arteries are patent without evidence of aneurysm,
dissection, vasculitis, fibromuscular dysplasia or significant
stenosis.

IMA: Patent without evidence of aneurysm, dissection, vasculitis or
significant stenosis.

Inflow: Patent without evidence of aneurysm, dissection, vasculitis
or significant stenosis.

Proximal Outflow: Incomplete opacification, without definite
pathology

Veins: Dedicated venous phase imaging not obtained.

Review of the MIP images confirms the above findings.

NON-VASCULAR

Lower chest: Bilateral pulmonary emboli as described on prior study.
Patchy peripheral airspace opacities in both lung bases stable. No
pleural or pericardial effusion.

Hepatobiliary: No focal liver abnormality is seen. No gallstones,
gallbladder wall thickening, or biliary dilatation.

Pancreas: Unremarkable. No pancreatic ductal dilatation or
surrounding inflammatory changes.

Spleen: 2 wedge-shaped peripheral regions of decreased enhancement
may represent sequela of early scan timing versus subcapsular
infarcts.

Adrenals/Urinary Tract: Normal adrenals. Bilateral striated
nephrograms on this early phase imaging. No urolithiasis or
hydronephrosis. Urinary bladder incompletely distended.

Stomach/Bowel: Stomach is nondistended. Small bowel nondilated.
Appendix not discretely identified. The colon is nondilated,
unremarkable.

Lymphatic: Prominent inguinal adenopathy left greater than right, up
to 13 mm short axis diameter. Bilateral iliac chain adenopathy up to
15 mm . Prominent subcentimeter left para-aortic and aortocaval
retroperitoneal lymph nodes. No mesenteric adenopathy.

Reproductive: Uterus and bilateral adnexa are unremarkable.

Other: No ascites.  No free air.

Musculoskeletal: Degenerative disc disease L2-S1. Negative for
fracture or worrisome bone lesion.
IMPRESSION: VASCULAR

1. Intraluminal aortic thrombus extends from the distal descending
thoracic aorta into the supra celiac abdominal aorta, terminating
just above the origin of the celiac axis.
2. No definite distal emboli are identified.

NON-VASCULAR

*Possible splenic infarcts versus inhomogeneous early phase
enhancement.
*Bilateral inguinal and iliac adenopathy, possibly reactive but
nonspecific.

## 2018-07-29 ENCOUNTER — Telehealth: Payer: Self-pay

## 2018-07-29 NOTE — Telephone Encounter (Signed)

## 2018-07-31 ENCOUNTER — Other Ambulatory Visit: Payer: Self-pay

## 2018-07-31 ENCOUNTER — Ambulatory Visit (INDEPENDENT_AMBULATORY_CARE_PROVIDER_SITE_OTHER): Payer: Self-pay | Admitting: Pharmacist

## 2018-07-31 DIAGNOSIS — Z7901 Long term (current) use of anticoagulants: Secondary | ICD-10-CM

## 2018-07-31 DIAGNOSIS — Z86711 Personal history of pulmonary embolism: Secondary | ICD-10-CM

## 2018-07-31 LAB — POCT INR: INR: 3.9 — AB (ref 2.0–3.0)

## 2018-07-31 NOTE — Patient Instructions (Signed)
Spoke with pt and instructed her to hold todays dose of coumadin and then continue with 2 tablets daily except 1.5 tablets each Monday, Wednesday and Friday. Repeat INR in 3 weeks

## 2018-08-06 ENCOUNTER — Other Ambulatory Visit: Payer: Self-pay | Admitting: Family Medicine

## 2018-08-06 DIAGNOSIS — R609 Edema, unspecified: Secondary | ICD-10-CM

## 2018-08-07 ENCOUNTER — Telehealth (INDEPENDENT_AMBULATORY_CARE_PROVIDER_SITE_OTHER): Payer: Self-pay | Admitting: Family Medicine

## 2018-08-07 ENCOUNTER — Other Ambulatory Visit: Payer: Self-pay

## 2018-08-07 DIAGNOSIS — Z7901 Long term (current) use of anticoagulants: Secondary | ICD-10-CM

## 2018-08-07 DIAGNOSIS — I2781 Cor pulmonale (chronic): Secondary | ICD-10-CM

## 2018-08-07 DIAGNOSIS — Z6841 Body Mass Index (BMI) 40.0 and over, adult: Secondary | ICD-10-CM

## 2018-08-07 DIAGNOSIS — N926 Irregular menstruation, unspecified: Secondary | ICD-10-CM

## 2018-08-07 DIAGNOSIS — E118 Type 2 diabetes mellitus with unspecified complications: Secondary | ICD-10-CM

## 2018-08-07 DIAGNOSIS — Z803 Family history of malignant neoplasm of breast: Secondary | ICD-10-CM

## 2018-08-07 MED ORDER — FUROSEMIDE 40 MG PO TABS: ORAL_TABLET | ORAL | 1 refills | Status: DC

## 2018-08-07 NOTE — Progress Notes (Signed)
Virtual Visit Note  I connected with patient on 08/07/18 at 1049am by phone and verified that I am speaking with the correct person using two identifiers. Alexa [name] is currently located at home and patient is currently with them during visit. The provider, Rutherford Guys, MD is located in their office at time of visit.  I discussed the limitations, risks, security and privacy concerns of performing an evaluation and management service by telephone and the availability of in person appointments. I also discussed with the patient that there may be a patient responsible charge related to this service. The patient expressed understanding and agreed to proceed.   CC: routine followup  HPI ? Patient is a 43 y.o. female with past medical history significant for DM2, Cor pulmonale, OSA untreated, pHTN, on long term anticoagulation after submassive PE in Feb 2018 who present for followup  Last OV March 2020 Telemedicine visit with cards last month, stable, no changes Last coumadin clinic visit on 07/31/2018  Has been removed from work protectively due to covid 19, as she is high risk, since April 1st  Overall she is doing well She does not check BP, CBG nor weight at home She reports she has been snacking more at night She has been coping well She denies any increased edema, SOB Denies any chest pain, cough Denies any fever or chills Denies any abd pain, nausea, vomiting Denies any polydipisia or polyuria Had her period last month, heavy, usually irregular Due for repeat mammo oct 2020  Wt Readings from Last 3 Encounters:  07/15/18 (!) 412 lb (186.9 kg)  06/11/18 (!) 440 lb (199.6 kg)  05/01/18 (!) 448 lb (203.2 kg)    Lab Results  Component Value Date   HGBA1C 6.1 (A) 02/19/2018   HGBA1C 6.1 07/30/2017   HGBA1C 5.7 (H) 06/10/2017   Lab Results  Component Value Date   MICROALBUR 1.4 01/28/2016   LDLCALC 85 02/19/2018   CREATININE 0.76 06/11/2018    Allergies   Allergen Reactions  . Penicillins Hives    Has patient had a PCN reaction causing immediate rash, facial/tongue/throat swelling, SOB or lightheadedness with hypotension: No Has patient had a PCN reaction causing severe rash involving mucus membranes or skin necrosis: No Has patient had a PCN reaction that required hospitalization No Has patient had a PCN reaction occurring within the last 10 years: No If all of the above answers are "NO", then may proceed with Cephalosporin use.    Prior to Admission medications   Medication Sig Start Date End Date Taking? Authorizing Provider  acetaminophen (TYLENOL) 500 MG tablet Take 1,000 mg by mouth daily as needed for mild pain.    [provider]  cetirizine (ZYRTEC) 10 MG tablet Take 1 tablet (10 mg total) by mouth daily. 12/11/16   Ivar Drape D, PA  Dulaglutide (TRULICITY) 5.63 JS/9.7WY SOPN Inject 0.75 mg into the skin once a week. 05/01/18   Rutherford Guys, MD  furosemide (LASIX) 40 MG tablet TAKE 1 AND 1/2 TABLETS(60 MG) BY MOUTH TWICE DAILY 08/06/18   Rutherford Guys, MD  gabapentin (NEURONTIN) 300 MG capsule TAKE 1 CAPSULE(300 MG) BY MOUTH TWICE DAILY 08/06/18   Rutherford Guys, MD  glucose blood test strip ascensia  Meter/strips preferred Test daily Use as instructed Dx.dm 06/23/16   Joretta Bachelor, PA  Lancets [facility] ULTRASOFT) lancets Test daily Use as instructed dm2 06/22/16   Ivar Drape D, PA  metFORMIN (GLUCOPHAGE) 500 MG tablet TAKE 1  TABLET BY MOUTH TWICE DAILY WITH MEALS 04/28/18   Horald Pollen, MD  Misc. Devices MISC Bariatric size shower chair with handle 423 lbs, BMI 72  Dx. Pulmonary HTN, DM2 with neuropathy, morbid obesity 11/13/17   Rutherford Guys, MD  potassium chloride (K-DUR,KLOR-CON) 10 MEQ tablet Take 1 tablet (10 mEq total) by mouth 2 (two) times daily. 06/11/18   Rutherford Guys, MD  warfarin (COUMADIN) 7.5 MG tablet Take 1.5 to 2 tablets daily as directed by coumadin clinic 06/26/18    Lorretta Harp, MD    Past Medical History:  Diagnosis Date  . CHF (congestive heart failure) ([place])   . DM2 (diabetes mellitus, type 2) ([place])   . Hypertension   . Nocturnal hypoxia   . Obstructive sleep apnea syndrome, moderate   . Pulmonary embolism ([place]) 05/14/2016   submassive, on chronic anticoagulation  . Pulmonary hypertension ([place])     Past Surgical History:  Procedure Laterality Date  . CESAREAN SECTION      Social History   Tobacco Use  . Smoking status: Former Smoker    Types: Cigarettes  . Smokeless tobacco: Never Used  . Tobacco comment: only smokes when she drinks alcohol - 1 pack per week  Substance Use Topics  . Alcohol use: Yes    Alcohol/week: 4.0 - 6.0 standard drinks    Types: 4 - 6 Standard drinks or equivalent per week    Comment: mixed drinks 3-4 times/weeks    Family History  Problem Relation Age of Onset  . Diabetes Maternal Grandmother   . Cancer Maternal Grandmother        breast cancer   . Hypertension Mother   . Cancer Maternal Aunt 55       breast cancer   . Cancer Maternal Aunt 55       breast cancer     ROS Per hpi  Objective  Vitals as reported by the patient: none   ASSESSMENT and PLAN  1. Type 2 diabetes mellitus with complication, without long-term current use of insulin (HCC) Last a1c at goal. More then 50% of this 25 minute visit was spent discussing LFM. Checking labs, medications will be adjusted as needed.  - Lipid panel; Future - TSH; Future - Hemoglobin A1c; Future - CMP14+EGFR; Future - Microalbumin / creatinine urine ratio; Future  2. FHx: breast cancer 3. Irregular menses - Ambulatory referral to Obstetrics / Gynecology Consider prog IUD for endometrial protection   4. COR (chronic cor pulmonale) (HCC) Stable. Continue current regime. Discussed limiting salt and RTC precautions. Patient is high risk of complications if infected with covid 19, therefore, extending stay at home work note.   5. Body  mass index (BMI) of 70 or greater in adult [facility]) Patient with sign weight loss, mostly fluid. However consider increasing trulicity to max dose. See #1 for LFM discussion.   6. Anticoagulated on Coumadin Managed by coumadin clinic   FOLLOW-UP: fasting labs within 1 week, followup with me in 3 months   The above assessment and management plan was discussed with the patient. The patient verbalized understanding of and has agreed to the management plan. Patient is aware to call the clinic if symptoms persist or worsen. Patient is aware when to return to the clinic for a follow-up visit. Patient educated on when it is appropriate to go to the emergency department.    I provided 25 minutes of non-face-to-face time during this encounter.  Rutherford Guys, MD Primary Care at  [facility] [place], [place] [address] Ph.  [phone] Fax [phone]

## 2018-08-07 NOTE — Progress Notes (Signed)
Follow up on diabetes. Needing refill on pended meds, no other medical concerns today. Labs have been pended for future draw

## 2018-08-11 ENCOUNTER — Ambulatory Visit: Payer: 59 | Admitting: Family Medicine

## 2018-08-14 ENCOUNTER — Telehealth: Payer: Self-pay | Admitting: Family Medicine

## 2018-08-14 ENCOUNTER — Ambulatory Visit: Payer: Self-pay

## 2018-08-14 ENCOUNTER — Other Ambulatory Visit: Payer: Self-pay | Admitting: Family Medicine

## 2018-08-14 ENCOUNTER — Other Ambulatory Visit: Payer: Self-pay | Admitting: Emergency Medicine

## 2018-08-14 DIAGNOSIS — Z7189 Other specified counseling: Secondary | ICD-10-CM

## 2018-08-14 NOTE — Telephone Encounter (Signed)
Pt dropped off DMV parking placard to be signed by dr. Doristine Section ppr work in Therapist, art  FR

## 2018-08-20 ENCOUNTER — Telehealth: Payer: Self-pay

## 2018-08-20 ENCOUNTER — Ambulatory Visit: Payer: Self-pay

## 2018-08-20 NOTE — Telephone Encounter (Signed)
LMOM FOR PRESCREEN  

## 2018-08-20 NOTE — Telephone Encounter (Signed)
Forms for handicapped sticker has been put at the front office

## 2018-08-20 NOTE — Telephone Encounter (Signed)
Letter faxed to pt job for additional month off of work due to cc, approved by [employer]

## 2018-08-22 ENCOUNTER — Ambulatory Visit: Payer: Self-pay

## 2018-08-22 ENCOUNTER — Other Ambulatory Visit: Payer: Self-pay

## 2018-08-22 ENCOUNTER — Telehealth: Payer: Self-pay | Admitting: Family Medicine

## 2018-08-22 NOTE — Telephone Encounter (Signed)
Copied from [place] [phone]. Topic: General - Other >> Aug 22, 2018  8:45 AM Carolyn Stare wrote:  Pt would like a copy of the note that was sent to her employee and her handicap application that is in her file , said she will come by and pick up today

## 2018-08-29 ENCOUNTER — Telehealth: Payer: Self-pay | Admitting: Pharmacist

## 2018-08-29 NOTE — Telephone Encounter (Signed)
LMOM; patient overdue for INR check and no show to last appointment.   Patient  to call back and schedule f/i INR ASAP.

## 2018-09-01 NOTE — Telephone Encounter (Signed)
2nd message left to schedule follow up visit

## 2018-09-11 ENCOUNTER — Other Ambulatory Visit: Payer: Self-pay

## 2018-09-11 ENCOUNTER — Ambulatory Visit (INDEPENDENT_AMBULATORY_CARE_PROVIDER_SITE_OTHER): Payer: Self-pay | Admitting: Pharmacist Clinician (PhC)/ Clinical Pharmacy Specialist

## 2018-09-11 DIAGNOSIS — Z7901 Long term (current) use of anticoagulants: Secondary | ICD-10-CM

## 2018-09-11 LAB — POCT INR: INR: 3.8 — AB (ref 2.0–3.0)

## 2018-09-11 NOTE — Patient Instructions (Signed)
No warfarin today Thursday June 18, then decrease dose to 1.5 tablets daily except 2 tablets each Sunday, Tuesday and Thursday.  Repeat INR in 3 weeks

## 2018-09-16 ENCOUNTER — Encounter: Payer: Self-pay | Admitting: Obstetrics & Gynecology

## 2018-09-29 ENCOUNTER — Telehealth: Payer: Self-pay

## 2018-09-29 NOTE — Telephone Encounter (Signed)
lmom for prescreen  

## 2018-10-03 ENCOUNTER — Other Ambulatory Visit: Payer: Self-pay

## 2018-10-08 ENCOUNTER — Telehealth: Payer: Self-pay

## 2018-10-08 NOTE — Telephone Encounter (Signed)
lmom for overdue inr 

## 2018-10-08 NOTE — Telephone Encounter (Signed)
Patient changed insurance and unable to come to clinic at this time. Trying to get new PCP. If problems with insurance continues, will schedule no-charge INR check x 1 until insurance problems resolved.

## 2018-10-16 ENCOUNTER — Other Ambulatory Visit: Payer: Self-pay | Admitting: Family Medicine

## 2018-10-16 NOTE — Telephone Encounter (Signed)
Requested Prescriptions  Pending Prescriptions Disp Refills  . potassium chloride (K-DUR) 10 MEQ tablet [Pharmacy Med Name: POTASSIUM CL MICRO 10MEQ ER TABS] 60 tablet 2    Sig: TAKE 1 TABLET(10 MEQ) BY MOUTH TWICE DAILY     Endocrinology:  Minerals - Potassium Supplementation Passed - 10/16/2018 10:28 AM      Passed - K in normal range and within 360 days    Potassium  Date Value Ref Range Status  06/11/2018 4.2 3.5 - 5.2 mmol/L Final         Passed - Cr in normal range and within 360 days    Creatinine  Date Value Ref Range Status  05/24/2017 0.84 0.60 - 1.10 mg/dL Final   Creat  Date Value Ref Range Status  01/28/2016 0.90 0.50 - 1.10 mg/dL Final   Creatinine, Ser  Date Value Ref Range Status  06/11/2018 0.76 0.57 - 1.00 mg/dL Final         Passed - Valid encounter within last 12 months    Recent Outpatient Visits          4 months ago Edema, unspecified type   [facility], Lilia Argue, MD   5 months ago Benign essential HTN   [facility], Lilia Argue, MD   7 months ago Type 2 diabetes mellitus with complication, without long-term current use of insulin [facility])   [facility], Lilia Argue, MD   11 months ago Irregular menses   [facility], Lilia Argue, MD   1 year ago 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)   [facility], Lilia Argue, MD      Future Appointments            In 3 weeks Rutherford Guys, MD Primary Care at [place], [facility]

## 2018-11-03 ENCOUNTER — Telehealth: Payer: Self-pay

## 2018-11-03 NOTE — Telephone Encounter (Signed)
lmomed pt overdue inr

## 2018-11-06 ENCOUNTER — Ambulatory Visit: Payer: Self-pay | Admitting: Family Medicine

## 2018-11-12 DIAGNOSIS — Z5181 Encounter for therapeutic drug level monitoring: Secondary | ICD-10-CM | POA: Insufficient documentation

## 2018-12-23 DIAGNOSIS — R791 Abnormal coagulation profile: Secondary | ICD-10-CM | POA: Insufficient documentation

## 2019-01-14 DIAGNOSIS — N926 Irregular menstruation, unspecified: Secondary | ICD-10-CM | POA: Insufficient documentation

## 2019-01-19 ENCOUNTER — Other Ambulatory Visit: Payer: 59

## 2019-06-08 DIAGNOSIS — R600 Localized edema: Secondary | ICD-10-CM | POA: Insufficient documentation

## 2020-03-02 ENCOUNTER — Other Ambulatory Visit: Payer: Self-pay

## 2020-03-02 ENCOUNTER — Ambulatory Visit (INDEPENDENT_AMBULATORY_CARE_PROVIDER_SITE_OTHER): Payer: 59 | Admitting: Registered Nurse

## 2020-03-02 VITALS — BP 129/81 | HR 89 | Temp 98.0°F | Resp 18 | Ht 64.0 in | Wt >= 6400 oz

## 2020-03-02 DIAGNOSIS — Z23 Encounter for immunization: Secondary | ICD-10-CM | POA: Diagnosis not present

## 2020-03-02 DIAGNOSIS — N926 Irregular menstruation, unspecified: Secondary | ICD-10-CM

## 2020-03-02 DIAGNOSIS — Z6841 Body Mass Index (BMI) 40.0 and over, adult: Secondary | ICD-10-CM | POA: Diagnosis not present

## 2020-03-02 DIAGNOSIS — E118 Type 2 diabetes mellitus with unspecified complications: Secondary | ICD-10-CM

## 2020-03-02 DIAGNOSIS — I2781 Cor pulmonale (chronic): Secondary | ICD-10-CM

## 2020-03-02 DIAGNOSIS — I272 Pulmonary hypertension, unspecified: Secondary | ICD-10-CM

## 2020-03-02 NOTE — Patient Instructions (Signed)
° ° ° °  If you have lab work done today you will be contacted with your lab results within the next 2 weeks.  If you have not heard from us then please contact us. The fastest way to get your results is to register for My Chart. ° ° °IF you received an x-ray today, you will receive an invoice from [facility]. Please contact [facility] at [phone] with questions or concerns regarding your invoice.  ° °IF you received labwork today, you will receive an invoice from [facility]. Please contact [facility] at [phone] with questions or concerns regarding your invoice.  ° °Our billing staff will not be able to assist you with questions regarding bills from these companies. ° °You will be contacted with the lab results as soon as they are available. The fastest way to get your results is to activate your My Chart account. Instructions are located on the last page of this paperwork. If you have not heard from us regarding the results in 2 weeks, please contact this office. °  ° ° ° °

## 2020-03-03 ENCOUNTER — Encounter: Payer: Self-pay | Admitting: Registered Nurse

## 2020-03-03 LAB — HEMOGLOBIN A1C
Est. average glucose Bld gHb Est-mCnc: 120 mg/dL
Hgb A1c MFr Bld: 5.8 % — ABNORMAL HIGH (ref 4.8–5.6)

## 2020-03-03 LAB — CBC WITH DIFFERENTIAL
Basophils Absolute: 0 10*3/uL (ref 0.0–0.2)
Basos: 0 %
EOS (ABSOLUTE): 0 10*3/uL (ref 0.0–0.4)
Eos: 0 %
Hematocrit: 39 % (ref 34.0–46.6)
Hemoglobin: 13.4 g/dL (ref 11.1–15.9)
Immature Grans (Abs): 0 10*3/uL (ref 0.0–0.1)
Immature Granulocytes: 0 %
Lymphocytes Absolute: 1.8 10*3/uL (ref 0.7–3.1)
Lymphs: 26 %
MCH: 32.5 pg (ref 26.6–33.0)
MCHC: 34.4 g/dL (ref 31.5–35.7)
MCV: 95 fL (ref 79–97)
Monocytes Absolute: 0.8 10*3/uL (ref 0.1–0.9)
Monocytes: 11 %
Neutrophils Absolute: 4.3 10*3/uL (ref 1.4–7.0)
Neutrophils: 63 %
RBC: 4.12 x10E6/uL (ref 3.77–5.28)
RDW: 16.9 % — ABNORMAL HIGH (ref 11.7–15.4)
WBC: 6.9 10*3/uL (ref 3.4–10.8)

## 2020-03-03 LAB — COMPREHENSIVE METABOLIC PANEL
ALT: 16 IU/L (ref 0–32)
AST: 20 IU/L (ref 0–40)
Albumin/Globulin Ratio: 1.3 (ref 1.2–2.2)
Albumin: 4 g/dL (ref 3.8–4.8)
Alkaline Phosphatase: 51 IU/L (ref 44–121)
BUN/Creatinine Ratio: 12 (ref 9–23)
BUN: 11 mg/dL (ref 6–24)
Bilirubin Total: 0.4 mg/dL (ref 0.0–1.2)
CO2: 26 mmol/L (ref 20–29)
Calcium: 9 mg/dL (ref 8.7–10.2)
Chloride: 99 mmol/L (ref 96–106)
Creatinine, Ser: 0.89 mg/dL (ref 0.57–1.00)
GFR calc Af Amer: 92 mL/min/{1.73_m2} (ref >59)
GFR calc non Af Amer: 80 mL/min/{1.73_m2} (ref >59)
Globulin, Total: 3.1 g/dL (ref 1.5–4.5)
Glucose: 86 mg/dL (ref 65–99)
Potassium: 5 mmol/L (ref 3.5–5.2)
Sodium: 137 mmol/L (ref 134–144)
Total Protein: 7.1 g/dL (ref 6.0–8.5)

## 2020-03-03 LAB — LIPID PANEL
Chol/HDL Ratio: 3.9 ratio (ref 0.0–4.4)
Cholesterol, Total: 176 mg/dL (ref 100–199)
HDL: 45 mg/dL (ref >39)
LDL Chol Calc (NIH): 111 mg/dL — ABNORMAL HIGH (ref 0–99)
Triglycerides: 113 mg/dL (ref 0–149)
VLDL Cholesterol Cal: 20 mg/dL (ref 5–40)

## 2020-03-03 LAB — TSH: TSH: 1.6 u[IU]/mL (ref 0.450–4.500)

## 2020-03-03 NOTE — Progress Notes (Signed)
Established Patient Office Visit  Subjective:  Patient ID: Alexa [name], female    DOB: 1975/09/09  Age: 44 y.o. MRN: [id number]  CC:  Chief Complaint  Patient presents with  . Transitions Of Care    Patient states Alexa [name] is here for a TOC.Patient states Alexa [name] would like to see if Alexa [name] needs medication refills and possibly some labs.  . Weight Loss    patient would like to discuss weight loss    HPI Alexa [name] presents for visit to est care  Formerly pt of Dr. Pamella Pert  Was est with [facility] for last 1-1.5 years as Alexa [name] insurance had changed - wants to come back fully to [facility].  Reviewed histories with patient and updated as warranted - history significant for pHTN, CHF, hx of PE and HTN managed by Cardiology - will want to reestablish with Alexa [name] office through [facility].  T2dm is currently managed by metformin 500mg  PO bid ac. Tolerates well. a1c has shown consistent control. No aes noted  Pt is interested in losing weight. Reports Alexa [name] has been trying to be more active - notes Alexa [name] endurance improving a lot - diet has included many more vegetables and much less junk food. Alexa [name] is interested in ways to optimize this whether medication or consultation with specialist, as Alexa [name] knows Alexa [name] weight has an impact on Alexa [name] health. Goal is to go to [employer] with Alexa [name] children when Alexa [name] son (48) graduates high school - not a ride person, but wants to have no issues spending the days walking around the parks.  Past Medical History:  Diagnosis Date  . CHF (congestive heart failure) ([place])   . Diabetes mellitus without complication ([place])    Phreesia 03/02/2020  . DM2 (diabetes mellitus, type 2) ([place])   . Hypertension   . Nocturnal hypoxia   . Obstructive sleep apnea syndrome, moderate   . Pulmonary embolism ([place]) 05/14/2016   submassive, on chronic anticoagulation  . Pulmonary hypertension ([place])     Past Surgical History:  Procedure Laterality Date  . CESAREAN SECTION      Family History   Problem Relation Age of Onset  . Diabetes Maternal Grandmother   . Breast cancer Maternal Grandmother        breast cancer   . Brain cancer Maternal Grandmother   . Hypertension Mother   . Breast cancer Maternal Aunt 35  . Breast cancer Maternal Aunt 60    Social History   Socioeconomic History  . Marital status: Divorced    Spouse name: n/a  . Number of children: 2  . Years of education: Not on file  . Highest education level: Not on file  Occupational History  . 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 Smoking status: Former Smoker    Types: Cigarettes  . Smokeless tobacco: Never Used  . Tobacco comment: only smokes when Alexa [name] drinks alcohol - 1 pack per week  Vaping Use  . Vaping Use: Never used  Substance and Sexual Activity  . Alcohol use: Yes    Alcohol/week: 4.0 - 6.0 standard drinks    Types: 4 - 6 Standard drinks or equivalent per week    Comment: mixed drinks 3-4 times/weeks  . Drug use: No  . Sexual activity: Not Currently  Other Topics Concern  . Not on file  Social History Narrative   Alexa [name] children live with Alexa [name].   Ex-husband lives locally.   Social Determinants of Health   Financial Resource Strain: Not on file  Food Insecurity: Not on file  Transportation Needs: Not on file  Physical Activity: Not on file  Stress: Not on file  Social Connections: Not on file  Intimate Partner Violence: Not on file    Outpatient Medications Prior to Visit  Medication Sig Dispense Refill  . acetaminophen (TYLENOL) 500 MG tablet Take 1,000 mg by mouth daily as needed for mild pain.    . cetirizine (ZYRTEC) 10 MG tablet Take 1 tablet (10 mg total) by mouth daily. 30 tablet 1  . Dulaglutide (TRULICITY) 1.19 ER/7.4YC SOPN Inject 0.75 mg into the skin once a week. 4 pen 5  . furosemide (LASIX) 40 MG tablet TAKE 1 AND 1/2 TABLETS(60 MG) BY MOUTH TWICE DAILY 270 tablet 1  . gabapentin (NEURONTIN) 300 MG capsule TAKE 1 CAPSULE(300 MG) BY MOUTH TWICE DAILY 180 capsule 0   . glucose blood test strip ascensia  Meter/strips preferred Test daily Use as instructed Dx.dm 100 each 3  . Lancets (ONETOUCH ULTRASOFT) lancets Test daily Use as instructed dm2 100 each 12  . metFORMIN (GLUCOPHAGE) 500 MG tablet TAKE 1 TABLET BY MOUTH TWICE DAILY WITH MEALS 180 tablet 0  . Misc. Devices MISC Bariatric size shower chair with handle 423 lbs, BMI 72  Dx. Pulmonary HTN, DM2 with neuropathy, morbid obesity 1 each 0  . potassium chloride (K-DUR) 10 MEQ tablet TAKE 1 TABLET(10 MEQ) BY MOUTH TWICE DAILY 60 tablet 2  . warfarin (COUMADIN) 7.5 MG tablet Take 1.5 to 2 tablets daily as directed by coumadin clinic 180 tablet 1   No facility-administered medications prior to visit.    Allergies  Allergen Reactions  . Penicillins Hives    Has patient had a PCN reaction causing immediate rash, facial/tongue/throat swelling, SOB or lightheadedness with hypotension: No Has patient had a PCN reaction causing severe rash involving mucus membranes or skin necrosis: No Has patient had a PCN reaction that required hospitalization No Has patient had a PCN reaction occurring within the last 10 years: No If all of the above answers are "NO", then may proceed with Cephalosporin use.    ROS Review of Systems  Constitutional: Negative.   HENT: Negative.   Eyes: Negative.   Respiratory: Negative.   Cardiovascular: Negative.   Gastrointestinal: Negative.   Genitourinary: Negative.   Musculoskeletal: Negative.   Skin: Negative.   Neurological: Negative.   Psychiatric/Behavioral: Negative.   All other systems reviewed and are negative.     Objective:    Physical Exam Vitals and nursing note reviewed.  Constitutional:      General: Alexa [name] is not in acute distress.    Appearance: Normal appearance. Alexa [name] is obese. Alexa [name] is not ill-appearing, toxic-appearing or diaphoretic.  Cardiovascular:     Rate and Rhythm: Normal rate and regular rhythm.     Heart sounds: Normal heart sounds. No  murmur heard. No friction rub. No gallop.   Pulmonary:     Effort: Pulmonary effort is normal. No respiratory distress.     Breath sounds: Normal breath sounds. No stridor. No wheezing, rhonchi or rales.  Chest:     Chest wall: No tenderness.  Skin:    General: Skin is warm and dry.  Neurological:     General: No focal deficit present.     Mental Status: Alexa [name] is alert and oriented to person, place, and time. Mental status is at baseline.  Psychiatric:        Mood and Affect: Mood normal.        Behavior: Behavior  normal.        Thought Content: Thought content normal.        Judgment: Judgment normal.     BP 129/81   Pulse 89   Temp 98 F (36.7 C) (Temporal)   Resp 18   Ht 5\' 4"  (1.626 m)   Wt (!) 420 lb 6.4 oz (190.7 kg)   SpO2 97%   BMI 72.16 kg/m  Wt Readings from Last 3 Encounters:  03/02/20 (!) 420 lb 6.4 oz (190.7 kg)  07/15/18 (!) 412 lb (186.9 kg)  06/11/18 (!) 440 lb (199.6 kg)     Health Maintenance Due  Topic Date Due  . FOOT EXAM  07/31/2018  . PAP SMEAR-Modifier  04/04/2020    There are no preventive care reminders to display for this patient.  Lab Results  Component Value Date   TSH 1.600 03/02/2020   Lab Results  Component Value Date   WBC 6.9 03/02/2020   HGB 13.4 03/02/2020   HCT 39.0 03/02/2020   MCV 95 03/02/2020   PLT 404 (H) 06/27/2017   Lab Results  Component Value Date   NA 137 03/02/2020   K 5.0 03/02/2020   CO2 26 03/02/2020   GLUCOSE 86 03/02/2020   BUN 11 03/02/2020   CREATININE 0.89 03/02/2020   BILITOT 0.4 03/02/2020   ALKPHOS 51 03/02/2020   AST 20 03/02/2020   ALT 16 03/02/2020   PROT 7.1 03/02/2020   ALBUMIN 4.0 03/02/2020   CALCIUM 9.0 03/02/2020   ANIONGAP 8 06/29/2017   Lab Results  Component Value Date   CHOL 176 03/02/2020   Lab Results  Component Value Date   HDL 45 03/02/2020   Lab Results  Component Value Date   LDLCALC 111 (H) 03/02/2020   Lab Results  Component Value Date   TRIG 113  03/02/2020   Lab Results  Component Value Date   CHOLHDL 3.9 03/02/2020   Lab Results  Component Value Date   HGBA1C 5.8 (H) 03/02/2020      Assessment & Plan:   Problem List Items Addressed This Visit      Cardiovascular and Mediastinum   Pulmonary hypertension ([place])   Relevant Orders   Ambulatory referral to Cardiology   Ambulatory referral to Pulmonology   COR (chronic cor pulmonale) ([place])   Relevant Orders   Ambulatory referral to Cardiology   Ambulatory referral to Pulmonology     Other   BMI 70 and over, adult ([place])   Relevant Orders   Amb Ref to Medical Weight Management    Other Visit Diagnoses    Flu vaccine need    -  Primary   Relevant Orders   Flu Vaccine QUAD 6+ mos PF IM (Fluarix Quad PF) (Completed)   Type 2 diabetes mellitus with complication, without long-term current use of insulin (HCC)       Relevant Orders   Hemoglobin A1c (Completed)   CBC With Differential (Completed)   Comprehensive metabolic panel (Completed)   Lipid panel (Completed)   Irregular menses       Relevant Orders   TSH (Completed)      No orders of the defined types were placed in this encounter.   Follow-up: No follow-ups on file.   PLAN  Pt is very motivated with goals in mind - would be a great candidate for Healthy Weight and Wellness. Will refer  Labs collected - will follow up as warranted  It looks like Alexa [name] has seen cardiology and pulmonology within  3 years - should still be established - but will place referrals to facilitate the process of appts being made  Return in 3-6 mos for labs and med check  Patient encouraged to call clinic with any questions, comments, or concerns.  I spent 48 minutes with this patient, more than 50% of which was spent counseling and/or educating.  Maximiano Coss, NP

## 2020-03-08 ENCOUNTER — Encounter: Payer: Self-pay | Admitting: Cardiovascular Disease

## 2020-03-08 ENCOUNTER — Ambulatory Visit (INDEPENDENT_AMBULATORY_CARE_PROVIDER_SITE_OTHER): Payer: 59 | Admitting: Cardiovascular Disease

## 2020-03-08 ENCOUNTER — Other Ambulatory Visit: Payer: Self-pay

## 2020-03-08 VITALS — BP 132/74 | HR 83 | Ht 64.0 in | Wt >= 6400 oz

## 2020-03-08 DIAGNOSIS — I5189 Other ill-defined heart diseases: Secondary | ICD-10-CM

## 2020-03-08 DIAGNOSIS — I119 Hypertensive heart disease without heart failure: Secondary | ICD-10-CM | POA: Diagnosis not present

## 2020-03-08 DIAGNOSIS — I10 Essential (primary) hypertension: Secondary | ICD-10-CM | POA: Diagnosis not present

## 2020-03-08 DIAGNOSIS — I272 Pulmonary hypertension, unspecified: Secondary | ICD-10-CM

## 2020-03-08 DIAGNOSIS — G4733 Obstructive sleep apnea (adult) (pediatric): Secondary | ICD-10-CM

## 2020-03-08 NOTE — Assessment & Plan Note (Signed)
History of morbid obesity with a BMI of 72 scheduled to see a dietitian/nutritionist.

## 2020-03-08 NOTE — Patient Instructions (Addendum)
Medication Instructions:  Your physician recommends that you continue on your current medications as directed. Please refer to the Current Medication list given to you today.  *If you need a refill on your cardiac medications before your next appointment, please call your pharmacy*  Procedures: Your physician has requested that you have an echocardiogram. Echocardiography is a painless test that uses sound waves to create images of your heart. It provides your doctor with information about the size and shape of your heart and how well your heart's chambers and valves are working. This procedure takes approximately one hour. There are no restrictions for this procedure. [employer]. [employer]. 3rd Floor.      Follow-Up: At [facility], you and your health needs are our priority.  As part of our continuing mission to provide you with exceptional heart care, we have created designated Provider Care Teams.  These Care Teams include your primary Cardiologist (physician) and Advanced Practice Providers (APPs -  Physician Assistants and Nurse Practitioners) who all work together to provide you with the care you need, when you need it.  We recommend signing up for the patient portal called "MyChart".  Sign up information is provided on this After Visit Summary.  MyChart is used to connect with patients for Virtual Visits (Telemedicine).  Patients are able to view lab/test results, encounter notes, upcoming appointments, etc.  Non-urgent messages can be sent to your provider as well.   To learn more about what you can do with MyChart, go to [email or web address].    Your next appointment:   12 month(s)  The format for your next appointment:   In Person  Provider:   Quay Burow, MD

## 2020-03-08 NOTE — Assessment & Plan Note (Signed)
History of pulmonary hypertension with PA pressures of 80 mmHg by 2D echo performed 06/11/2017.  I am going to repeat a 2D echocardiogram.

## 2020-03-08 NOTE — Assessment & Plan Note (Signed)
History of diastolic dysfunction on oral diuretics. 

## 2020-03-08 NOTE — Assessment & Plan Note (Signed)
History of symptoms consistent with obstructive sleep apnea although because of financial reasons she never had an outpatient sleep study.

## 2020-03-08 NOTE — Progress Notes (Signed)
03/08/2020 Alexa [name]   May 10, 1975  [id number]  Primary Physician Alexa [name], Alexa Argue, MD Primary Cardiologist: Alexa Harp MD Alexa [name], Alexa [name]  HPI:  Alexa [name] is a 44 y.o.  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 I last spoke to her for a virtual telemedicine phone visit 07/15/2018.  She was hospitalized for acute on chronic right-sided heart failure with volume overload and was diuresed. She did have a history of submassive pulmonary embolus February 2018 currently on Coumadin anticoagulation. Other problems include diabetes and hypertension. She walks with a cane. Her edema is about at baseline.  Since I spoke to her a year and a half ago she has done well.  Her weight is up 10 to 12 pounds.  She is scheduled to see a dietitian/nutritionist.  She continues to walk without a cane.  She denies chest pain or shortness of breath.  She has chronic lower extremity edema and pulmonary hypertension with PA pressures in the 80s by 2D echo performed March 2018.    Current Meds  Medication Sig  . acetaminophen (TYLENOL) 500 MG tablet Take 1,000 mg by mouth daily as needed for mild pain.  . cetirizine (ZYRTEC) 10 MG tablet Take 1 tablet (10 mg total) by mouth daily.  . Dulaglutide (TRULICITY) 5.64 PP/2.9JJ SOPN Inject 0.75 mg into the skin once a week.  . furosemide (LASIX) 40 MG tablet TAKE 1 AND 1/2 TABLETS(60 MG) BY MOUTH TWICE DAILY  . gabapentin (NEURONTIN) 300 MG capsule TAKE 1 CAPSULE(300 MG) BY MOUTH TWICE DAILY  . glucose blood test strip ascensia  Meter/strips preferred Test daily Use as instructed Dx.dm  . Lancets (ONETOUCH ULTRASOFT) lancets Test daily Use as instructed dm2  . metFORMIN (GLUCOPHAGE) 500 MG tablet TAKE 1 TABLET BY MOUTH TWICE DAILY WITH MEALS  . Misc. Devices MISC Bariatric size shower chair with handle 423 lbs, BMI 72  Dx. Pulmonary HTN, DM2 with  neuropathy, morbid obesity  . potassium chloride (K-DUR) 10 MEQ tablet TAKE 1 TABLET(10 MEQ) BY MOUTH TWICE DAILY  . warfarin (COUMADIN) 7.5 MG tablet Take 1.5 to 2 tablets daily as directed by coumadin clinic     Allergies  Allergen Reactions  . Penicillins Hives    Has patient had a PCN reaction causing immediate rash, facial/tongue/throat swelling, SOB or lightheadedness with hypotension: No Has patient had a PCN reaction causing severe rash involving mucus membranes or skin necrosis: No Has patient had a PCN reaction that required hospitalization No Has patient had a PCN reaction occurring within the last 10 years: No If all of the above answers are "NO", then may proceed with Cephalosporin use.    Social History   Socioeconomic History  . Marital status: Divorced    Spouse name: n/a  . Number of children: 2  . Years of education: Not on file  . Highest education level: Not on file  Occupational History  . 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 Smoking status: Former Smoker    Types: Cigarettes  . Smokeless tobacco: Never Used  . Tobacco comment: only smokes when she drinks alcohol - 1 pack per week  Vaping Use  . Vaping Use: Never used  Substance and Sexual Activity  . Alcohol use: Yes    Alcohol/week: 4.0 - 6.0 standard drinks    Types: 4 - 6 Standard drinks or equivalent per week    Comment: mixed  drinks 3-4 times/weeks  . Drug use: No  . Sexual activity: Not Currently  Other Topics Concern  . Not on file  Social History Narrative   Her children live with her.   Ex-husband lives locally.   Social Determinants of Health   Financial Resource Strain: Not on file  Food Insecurity: Not on file  Transportation Needs: Not on file  Physical Activity: Not on file  Stress: Not on file  Social Connections: Not on file  Intimate Partner Violence: Not on file     Review of Systems: General: negative for chills, fever, night sweats or weight changes.   Cardiovascular: negative for chest pain, dyspnea on exertion, edema, orthopnea, palpitations, paroxysmal nocturnal dyspnea or shortness of breath Dermatological: negative for rash Respiratory: negative for cough or wheezing Urologic: negative for hematuria Abdominal: negative for nausea, vomiting, diarrhea, bright red blood per rectum, melena, or hematemesis Neurologic: negative for visual changes, syncope, or dizziness All other systems reviewed and are otherwise negative except as noted above.    Blood pressure 132/74, pulse 83, height 5\' 4"  (1.626 m), weight (!) 423 lb (191.9 kg), SpO2 97 %.  General appearance: alert and no distress Neck: no adenopathy, no carotid bruit, no JVD, supple, symmetrical, trachea midline and thyroid not enlarged, symmetric, no tenderness/mass/nodules Lungs: clear to auscultation bilaterally Heart: regular rate and rhythm, S1, S2 normal, no murmur, click, rub or gallop Extremities: Chronic lower extremity edema Pulses: 2+ and symmetric Skin: Skin color, texture, turgor normal. No rashes or lesions Neurologic: Alert and oriented X 3, normal strength and tone. Normal symmetric reflexes. Normal coordination and gait  EKG sinus rhythm at 83 with rightward axis.  I personally reviewed this EKG.  ASSESSMENT AND PLAN:   OSA (obstructive sleep apnea) History of symptoms consistent with obstructive sleep apnea although because of financial reasons she never had an outpatient sleep study.  Class 3 obesity due to excess calories with serious comorbidity and body mass index (BMI) greater than or equal to 70 in adult History of morbid obesity with a BMI of 72 scheduled to see a dietitian/nutritionist.  History of pulmonary embolism History of massive pulmonary embolism February 2018 on Coumadin anticoagulation since although she is not had her INR checked in 6 months because of insurance reasons.  She is scheduled to have that checked tomorrow in our Coumadin  clinic.  She did have a 2D echocardiogram performed 06/11/2017 that revealed normal LV systolic function, and findings consistent with chronic cor pulmonale with pulmonary hypertension and a dilated right ventricle.  I am going to repeat a 2D echocardiogram.  She really denies shortness of breath.  Diastolic dysfunction History of diastolic dysfunction on oral diuretics.  Pulmonary hypertension ([place]) History of pulmonary hypertension with PA pressures of 80 mmHg by 2D echo performed 06/11/2017.  I am going to repeat a 2D echocardiogram.      Alexa Harp MD [facility], [facility] 03/08/2020 3:04 PM

## 2020-03-08 NOTE — Assessment & Plan Note (Signed)
History of massive pulmonary embolism February 2018 on Coumadin anticoagulation since although she is not had her INR checked in 6 months because of insurance reasons.  She is scheduled to have that checked tomorrow in our Coumadin clinic.  She did have a 2D echocardiogram performed 06/11/2017 that revealed normal LV systolic function, and findings consistent with chronic cor pulmonale with pulmonary hypertension and a dilated right ventricle.  I am going to repeat a 2D echocardiogram.  She really denies shortness of breath.

## 2020-03-09 ENCOUNTER — Ambulatory Visit (INDEPENDENT_AMBULATORY_CARE_PROVIDER_SITE_OTHER): Payer: 59

## 2020-03-09 DIAGNOSIS — I4891 Unspecified atrial fibrillation: Secondary | ICD-10-CM | POA: Insufficient documentation

## 2020-03-09 DIAGNOSIS — Z7901 Long term (current) use of anticoagulants: Secondary | ICD-10-CM

## 2020-03-09 DIAGNOSIS — I2699 Other pulmonary embolism without acute cor pulmonale: Secondary | ICD-10-CM

## 2020-03-09 DIAGNOSIS — I824Y9 Acute embolism and thrombosis of unspecified deep veins of unspecified proximal lower extremity: Secondary | ICD-10-CM | POA: Insufficient documentation

## 2020-03-09 LAB — POCT INR: INR: 3.1 — AB (ref 2.0–3.0)

## 2020-03-09 MED ORDER — WARFARIN SODIUM 7.5 MG PO TABS: ORAL_TABLET | ORAL | 1 refills | Status: DC

## 2020-03-09 NOTE — Patient Instructions (Signed)
Take 1.5 tablets daily except 1 tablet on Tuesday.  Eat greens today.  INR in 4 weeks

## 2020-03-28 ENCOUNTER — Ambulatory Visit (HOSPITAL_COMMUNITY): Payer: Self-pay | Attending: Cardiology

## 2020-03-28 ENCOUNTER — Other Ambulatory Visit: Payer: Self-pay

## 2020-03-28 DIAGNOSIS — I272 Pulmonary hypertension, unspecified: Secondary | ICD-10-CM

## 2020-03-28 DIAGNOSIS — I119 Hypertensive heart disease without heart failure: Secondary | ICD-10-CM

## 2020-03-28 DIAGNOSIS — I10 Essential (primary) hypertension: Secondary | ICD-10-CM

## 2020-03-28 LAB — ECHOCARDIOGRAM COMPLETE
Area-P 1/2: 3.7 cm2
S' Lateral: 2.8 cm

## 2020-04-13 ENCOUNTER — Other Ambulatory Visit: Payer: Self-pay

## 2020-04-13 ENCOUNTER — Ambulatory Visit (INDEPENDENT_AMBULATORY_CARE_PROVIDER_SITE_OTHER): Payer: Self-pay

## 2020-04-13 DIAGNOSIS — Z7901 Long term (current) use of anticoagulants: Secondary | ICD-10-CM

## 2020-04-13 LAB — POCT INR: INR: 2.7 (ref 2.0–3.0)

## 2020-04-13 NOTE — Patient Instructions (Signed)
Take 1.5 tablets daily except 1 tablet on Tuesday.   INR in 6 weeks  

## 2020-04-21 ENCOUNTER — Other Ambulatory Visit: Payer: Self-pay | Admitting: Cardiovascular Disease

## 2020-04-21 DIAGNOSIS — Z7901 Long term (current) use of anticoagulants: Secondary | ICD-10-CM

## 2020-04-21 DIAGNOSIS — I2699 Other pulmonary embolism without acute cor pulmonale: Secondary | ICD-10-CM

## 2020-05-20 ENCOUNTER — Encounter (INDEPENDENT_AMBULATORY_CARE_PROVIDER_SITE_OTHER): Payer: Self-pay

## 2020-05-25 ENCOUNTER — Ambulatory Visit (INDEPENDENT_AMBULATORY_CARE_PROVIDER_SITE_OTHER): Payer: Self-pay

## 2020-05-25 ENCOUNTER — Other Ambulatory Visit: Payer: Self-pay

## 2020-05-25 DIAGNOSIS — Z7901 Long term (current) use of anticoagulants: Secondary | ICD-10-CM

## 2020-05-25 LAB — POCT INR: INR: 2.4 (ref 2.0–3.0)

## 2020-05-25 NOTE — Patient Instructions (Signed)
Take 1.5 tablets daily except 1 tablet on Tuesday.   INR in 6 weeks

## 2020-06-14 ENCOUNTER — Other Ambulatory Visit: Payer: Self-pay

## 2020-06-14 ENCOUNTER — Other Ambulatory Visit: Payer: Self-pay | Admitting: Registered Nurse

## 2020-06-14 ENCOUNTER — Telehealth: Payer: Self-pay | Admitting: Registered Nurse

## 2020-06-14 DIAGNOSIS — Z7901 Long term (current) use of anticoagulants: Secondary | ICD-10-CM

## 2020-06-14 DIAGNOSIS — I2781 Cor pulmonale (chronic): Secondary | ICD-10-CM

## 2020-06-14 DIAGNOSIS — I2699 Other pulmonary embolism without acute cor pulmonale: Secondary | ICD-10-CM

## 2020-06-14 DIAGNOSIS — Z7189 Other specified counseling: Secondary | ICD-10-CM

## 2020-06-14 MED ORDER — WARFARIN SODIUM 7.5 MG PO TABS: ORAL_TABLET | ORAL | 0 refills | Status: DC

## 2020-06-14 MED ORDER — BLOOD GLUCOSE MONITOR KIT: PACK | 0 refills | Status: AC

## 2020-06-14 MED ORDER — FUROSEMIDE 40 MG PO TABS: ORAL_TABLET | ORAL | 0 refills | Status: DC

## 2020-06-14 MED ORDER — METFORMIN HCL 500 MG PO TABS: 500.0000 mg | ORAL_TABLET | Freq: Two times a day (BID) | ORAL | 1 refills | Status: DC

## 2020-06-14 NOTE — Telephone Encounter (Signed)
Medication: metFORMIN (GLUCOPHAGE) 500 MG tablet  furosemide (LASIX) 40 MG tablet  Has the pt contacted their pharmacy? Yes she said they told her it cannot be filled  Preferred pharmacy: [facility] [place], [place]  Pt saw Maximiano Coss in Dec 2021  Please be advised refills may take up to 3 business days.  We ask that you follow up with your pharmacy.

## 2020-06-14 NOTE — Telephone Encounter (Signed)
Requested medication (s) are due for refill today: yes  Requested medication (s) are on the active medication list: yes  Last refill:  metformin: 08/14/18   Furosemide: 08/07/18  Future visit scheduled: no  Notes to clinic:  no refill protocol for this order   Requested Prescriptions  Pending Prescriptions Disp Refills   metFORMIN (GLUCOPHAGE) 500 MG tablet 180 tablet 0    Sig: Take 1 tablet (500 mg total) by mouth 2 (two) times daily with a meal.      There is no refill protocol information for this order      furosemide (LASIX) 40 MG tablet 270 tablet 1    Sig: TAKE 1 AND 1/2 TABLETS(60 MG) BY MOUTH TWICE DAILY      There is no refill protocol information for this order

## 2020-06-14 NOTE — Telephone Encounter (Signed)
Pt needs a new glucometer, lancets and strips sent to   [facility] [place] [address]  Pt's machine broke and it will not read the strips. Pt states Richard referred her for weight loss and they want her to check her sugars daily.

## 2020-06-14 NOTE — Telephone Encounter (Signed)
Sent generic to be filled according to pt insurance. Called pt LM

## 2020-06-16 ENCOUNTER — Ambulatory Visit: Payer: Self-pay | Admitting: Pulmonary Disease

## 2020-06-17 ENCOUNTER — Other Ambulatory Visit: Payer: Self-pay

## 2020-06-17 MED ORDER — GLUCOSE BLOOD VI STRP: ORAL_STRIP | 3 refills | Status: DC

## 2020-06-17 MED ORDER — ONETOUCH ULTRASOFT LANCETS MISC: 12 refills | Status: AC

## 2020-06-17 NOTE — Telephone Encounter (Signed)
Pt is calling back to state that the pharmacy did not receive a script for the Lancets and strips.  [facility] [place] Please advise [phone]

## 2020-06-17 NOTE — Telephone Encounter (Signed)
Sent!

## 2020-06-22 ENCOUNTER — Encounter (INDEPENDENT_AMBULATORY_CARE_PROVIDER_SITE_OTHER): Payer: Self-pay | Admitting: Family Medicine

## 2020-06-22 ENCOUNTER — Other Ambulatory Visit: Payer: Self-pay

## 2020-06-22 ENCOUNTER — Ambulatory Visit (INDEPENDENT_AMBULATORY_CARE_PROVIDER_SITE_OTHER): Payer: 59 | Admitting: Family Medicine

## 2020-06-22 VITALS — BP 171/70 | HR 79 | Temp 98.2°F | Ht 64.0 in | Wt >= 6400 oz

## 2020-06-22 DIAGNOSIS — I509 Heart failure, unspecified: Secondary | ICD-10-CM

## 2020-06-22 DIAGNOSIS — Z9189 Other specified personal risk factors, not elsewhere classified: Secondary | ICD-10-CM | POA: Diagnosis not present

## 2020-06-22 DIAGNOSIS — E1169 Type 2 diabetes mellitus with other specified complication: Secondary | ICD-10-CM | POA: Diagnosis not present

## 2020-06-22 DIAGNOSIS — R0602 Shortness of breath: Secondary | ICD-10-CM

## 2020-06-22 DIAGNOSIS — E559 Vitamin D deficiency, unspecified: Secondary | ICD-10-CM

## 2020-06-22 DIAGNOSIS — R5383 Other fatigue: Secondary | ICD-10-CM | POA: Diagnosis not present

## 2020-06-22 DIAGNOSIS — E782 Mixed hyperlipidemia: Secondary | ICD-10-CM | POA: Diagnosis not present

## 2020-06-22 DIAGNOSIS — Z1331 Encounter for screening for depression: Secondary | ICD-10-CM

## 2020-06-22 DIAGNOSIS — Z6841 Body Mass Index (BMI) 40.0 and over, adult: Secondary | ICD-10-CM

## 2020-06-22 DIAGNOSIS — I10 Essential (primary) hypertension: Secondary | ICD-10-CM

## 2020-06-22 DIAGNOSIS — Z0289 Encounter for other administrative examinations: Secondary | ICD-10-CM

## 2020-06-22 NOTE — Progress Notes (Signed)
Office: [phone]  /  Fax: [phone]    Date: June 28, 2020   Appointment Start Time: 11:48am Duration: 36 minutes Provider: Lawerance Cruel, Psy.D. Type of Session: Intake for Individual Therapy  Location of Patient: Home Location of Provider: Provider's Home (private office) Type of Contact: Telepsychological Visit via MyChart Video Visit  Informed Consent: Prior to proceeding with today's appointment, two pieces of identifying information were obtained. In addition, Courney's physical location at the time of this appointment was obtained as well a phone number she could be reached at in the event of technical difficulties. Meriah and this provider participated in today's telepsychological service.   The provider's role was explained to [employer]. The provider reviewed and discussed issues of confidentiality, privacy, and limits therein (e.g., reporting obligations). In addition to verbal informed consent, written informed consent for psychological services was obtained prior to the initial appointment. Since the clinic is not a 24/7 crisis center, mental health emergency resources were shared and this  provider explained MyChart, e-mail, voicemail, and/or other messaging systems should be utilized only for non-emergency reasons. This provider also explained that information obtained during appointments will be placed in Breya's medical record and relevant information will be shared with other providers at [facility] for coordination of care. Ekam agreed information may be shared with other [facility] providers as needed for coordination of care and by signing the service agreement document, she provided written consent for coordination of care. Prior to initiating telepsychological services, Nadezhda completed an informed consent document, which included the development of a safety plan (i.e., an emergency contact and emergency resources) in the event of  an emergency/crisis. Oline expressed understanding of the rationale of the safety plan. Jeriah verbally acknowledged understanding she is ultimately responsible for understanding her insurance benefits for telepsychological and in-person services. This provider also reviewed confidentiality, as it relates to telepsychological services, as well as the rationale for telepsychological services (i.e., to reduce exposure risk to COVID-19). Paitlyn  acknowledged understanding that appointments cannot be recorded without both party consent and she is aware she is responsible for securing confidentiality on her end of the session. Shellby verbally consented to proceed.  Chief Complaint/HPI: Akeema was referred by Dr. Quillian Quince during their initial visit on June 22, 2020. Mabeline's Food and Mood (modified PHQ-9) score on June 22, 2020 was 17.  During today's appointment, Kaley was verbally administered a questionnaire assessing various behaviors related to emotional eating. Vianka endorsed the following: experience food cravings on a regular basis, eat certain foods when you are anxious, stressed, depressed, or your feelings are hurt, use food to help you cope with emotional situations, overeat frequently when you are bored or lonely, not worry about what you eat when you are in a good mood, eat to help you stay awake and eat as a reward. She shared she craves ice cream. Jahanna believes the onset of emotional eating was likely around 2010, noting she was in school full time, working full time, had a new baby, and her son was around 69-56 years old. Vana reported she also noticed an increase in alcohol use during that time, adding she would drink before eating. As such, she indicated she would eat late into the night. In addition, Tashonda denied a history of binge eating. Corsica denied a history of restricting food intake, purging and engagement in other compensatory strategies, and has never been diagnosed with an  eating disorder. She also denied a history of  treatment for emotional eating. Furthermore, Areta reported she is skipping meals despite having the structured meal plan. She also noticed an increase in soda and sweets intake. Aisia further shared she "like[s] to eat healthy," adding she enjoys fruits and vegetables.   Mental Status Examination:  Appearance: well groomed and appropriate hygiene  Behavior: appropriate to circumstances Mood: euthymic Affect: mood congruent Speech: normal in rate, volume, and tone Eye Contact: appropriate Psychomotor Activity: unable to assess  Gait: unable to assess Thought Process: linear, logical, and goal directed  Thought Content/Perception: denies suicidal and homicidal ideation, plan, and intent and no hallucinations, delusions, bizarre thinking or behavior reported or observed Orientation: time, person, place, and purpose of appointment Memory/Concentration: memory, attention, language, and fund of knowledge intact  Insight/Judgment: fair  Family & Psychosocial History: Mannie reported she is not in a relationship and she has two children (ages 31 and 38). 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 Additionally, Sonali shared her highest level of education obtained is an associate degree. Currently, Renleigh's social support system consists of her church, friends, and family. Moreover, Rozann stated she resides with her children.   Medical History:  Past Medical History:  Diagnosis Date  . Anemia   . Back pain   . Bilateral swelling of feet   . CHF (congestive heart failure) ([place])   . Diabetes mellitus without complication ([place])    Phreesia 03/02/2020  . DM2 (diabetes mellitus, type 2) ([place])   . Hypertension   . Joint pain   . Nocturnal hypoxia   . Obstructive sleep apnea syndrome, moderate   . Pulmonary embolism ([place]) 05/14/2016   submassive, on chronic anticoagulation  . Pulmonary hypertension ([place])    . Vitamin D deficiency    Past Surgical History:  Procedure Laterality Date  . CESAREAN SECTION     Current Outpatient Medications on File Prior to Visit  Medication Sig Dispense Refill  . acetaminophen (TYLENOL) 500 MG tablet Take 1,000 mg by mouth daily as needed for mild pain.    . blood glucose meter kit and supplies KIT Use up to four times daily as directed. 1 each 0  . cetirizine (ZYRTEC) 10 MG tablet Take 1 tablet (10 mg total) by mouth daily. 30 tablet 1  . Dulaglutide (TRULICITY) 2.95 MW/4.1LK SOPN Inject 0.75 mg into the skin once a week. 4 pen 5  . furosemide (LASIX) 40 MG tablet TAKE 1 AND 1/2 TABLETS(60 MG) BY MOUTH TWICE DAILY 270 tablet 0  . gabapentin (NEURONTIN) 300 MG capsule TAKE 1 CAPSULE(300 MG) BY MOUTH TWICE DAILY 180 capsule 0  . glucose blood test strip ascensia  Meter/strips preferred Test daily Use as instructed Dx.dm 100 each 3  . Lancets (ONETOUCH ULTRASOFT) lancets Test daily Use as instructed dm2 100 each 12  . metFORMIN (GLUCOPHAGE) 500 MG tablet Take 1 tablet (500 mg total) by mouth 2 (two) times daily with a meal. 180 tablet 1  . Misc. Devices MISC Bariatric size shower chair with handle 423 lbs, BMI 72  Dx. Pulmonary HTN, DM2 with neuropathy, morbid obesity 1 each 0  . potassium chloride (K-DUR) 10 MEQ tablet TAKE 1 TABLET(10 MEQ) BY MOUTH TWICE DAILY 60 tablet 2  . warfarin (COUMADIN) 7.5 MG tablet TAKE 1 AND 1/2 TO 2 TABLETS BY MOUTH DAILY AS DIRECTED BY COUMADIN CLINIC 180 tablet 0   No current facility-administered medications on file prior to visit.   Mental Health History: Olivia reported she  has never attended therapeutic services. She denied a history of psychotropic medications. Chrisette reported there is no history of hospitalizations for psychiatric concerns. Loraine denied a family history of mental health/substance abuse related concerns. Karle reported there is no history of trauma including psychological, physical  and sexual abuse, as  well as neglect.   Jonnae described her typical mood lately as "overwhelming." She explained she is cutting back on "extracurricular activities" due to having to transport her children to school and their activities. She explained she has been in the process of learning how to read music and performing with a group. She also participates in reenactments and live theater. Aside from concerns noted above and endorsed on the PHQ-9, Kelda reported experiencing occasional crying spells when feeling overwhelmed; feeling stuck about her weight; and self-defeating thoughts. Keyetta reported a significant reduction in alcohol use, noting she consumes one standard drink approximately once a week. She endorsed smoking cigarettes when consuming alcohol, noting she typically smokes seven cigarettes. She denied illicit/recreational substance use. Regarding caffeine intake, Jaedin reported consuming approximately 10 oz of coffee daily and approximately two 12 oz sodas daily. Furthermore, Leonie indicated she is not experiencing the following: hallucinations and delusions, paranoia, symptoms of mania , social withdrawal, panic attacks and decreased motivation. She also denied history of and current suicidal ideation, plan, and intent; history of and current homicidal ideation, plan, and intent; and history of and current engagement in self-harm.  The following strengths were reported by Kloee: "love, laughter, singing, interacting with people, communicating, and trying to stay focused as much as possible." The following strengths were observed by this provider: ability to express thoughts and feelings during the therapeutic session, ability to establish and benefit from a therapeutic relationship, willingness to work toward established goal(s) with the clinic and ability to engage in reciprocal conversation.   Legal History: [name] reported there is no history of legal involvement.   Structured Assessments Results: The  Patient Health Questionnaire-9 (PHQ-9) is a self-report measure that assesses symptoms and severity of depression over the course of the last two weeks. Carlita obtained a score of 2 suggesting minimal depression. Kiante finds the endorsed symptoms to be not difficult at all. [0= Not at all; 1= Several days; 2= More than half the days; 3= Nearly every day] Little interest or pleasure in doing things 0  Feeling down, depressed, or hopeless 1  Trouble falling or staying asleep, or sleeping too much 0  Feeling tired or having little energy 0  Poor appetite or overeating 0  Feeling bad about yourself --- or that you are a failure or have let yourself or your family down 1  Trouble concentrating on things, such as reading the newspaper or watching television 0  Moving or speaking so slowly that other people could have noticed? Or the opposite --- being so fidgety or restless that you have been moving around a lot more than usual 0  Thoughts that you would be better off dead or hurting yourself in some way 0  PHQ-9 Score 2    The Generalized Anxiety Disorder-7 (GAD-7) is a brief self-report measure that assesses symptoms of anxiety over the course of the last two weeks. Alexia obtained a score of 0. [0= Not at all; 1= Several days; 2= Over half the days; 3= Nearly every day] Feeling nervous, anxious, on edge 0  Not being able to stop or control worrying 0  Worrying too much about different things 0  Trouble relaxing 0  Being  so restless that it's hard to sit still 0  Becoming easily annoyed or irritable 0  Feeling afraid as if something awful might happen 0  GAD-7 Score 0   Interventions:  Conducted a chart review Focused on rapport building Verbally administered PHQ-9 and GAD-7 for symptom monitoring Verbally administered Food & Mood questionnaire to assess various behaviors related to emotional eating Provided emphatic reflections and validation Collaborated with patient on a treatment goal   Psychoeducation provided regarding physical versus emotional hunger  Provisional DSM-5 Diagnosis(es): 311 (F32.8) Other Specified Depressive Disorder, Emotional Eating Behaviors  Plan: Eila appears able and willing to participate as evidenced by collaboration on a treatment goal, engagement in reciprocal conversation, and asking questions as needed for clarification. The next appointment will be scheduled in approximately two weeks, which will be via MyChart Video Visit. The following treatment goal was established: increase coping skills. This provider will regularly review the treatment plan and medical chart to keep informed of status changes. Akili expressed understanding and agreement with the initial treatment plan of care. Cecilia will be sent a handout via e-mail to utilize between now and the next appointment to increase awareness of hunger patterns and subsequent eating. Kimmberly provided verbal consent during today's appointment for this provider to send the handout via e-mail.

## 2020-06-23 LAB — COMPREHENSIVE METABOLIC PANEL
ALT: 17 IU/L (ref 0–32)
AST: 18 IU/L (ref 0–40)
Albumin/Globulin Ratio: 1.2 (ref 1.2–2.2)
Albumin: 4.1 g/dL (ref 3.8–4.8)
Alkaline Phosphatase: 57 IU/L (ref 44–121)
BUN/Creatinine Ratio: 12 (ref 9–23)
BUN: 10 mg/dL (ref 6–24)
Bilirubin Total: 0.4 mg/dL (ref 0.0–1.2)
CO2: 26 mmol/L (ref 20–29)
Calcium: 8.6 mg/dL — ABNORMAL LOW (ref 8.7–10.2)
Chloride: 97 mmol/L (ref 96–106)
Creatinine, Ser: 0.85 mg/dL (ref 0.57–1.00)
Globulin, Total: 3.3 g/dL (ref 1.5–4.5)
Glucose: 85 mg/dL (ref 65–99)
Potassium: 4.9 mmol/L (ref 3.5–5.2)
Sodium: 138 mmol/L (ref 134–144)
Total Protein: 7.4 g/dL (ref 6.0–8.5)
eGFR: 88 mL/min/{1.73_m2} (ref >59)

## 2020-06-23 LAB — VITAMIN B12: Vitamin B-12: 151 pg/mL — ABNORMAL LOW (ref 232–1245)

## 2020-06-23 LAB — CBC WITH DIFFERENTIAL/PLATELET
Basophils Absolute: 0 10*3/uL (ref 0.0–0.2)
Basos: 0 %
EOS (ABSOLUTE): 0 10*3/uL (ref 0.0–0.4)
Eos: 0 %
Hematocrit: 40.7 % (ref 34.0–46.6)
Hemoglobin: 13.6 g/dL (ref 11.1–15.9)
Immature Grans (Abs): 0 10*3/uL (ref 0.0–0.1)
Immature Granulocytes: 0 %
Lymphocytes Absolute: 1.6 10*3/uL (ref 0.7–3.1)
Lymphs: 22 %
MCH: 31.1 pg (ref 26.6–33.0)
MCHC: 33.4 g/dL (ref 31.5–35.7)
MCV: 93 fL (ref 79–97)
Monocytes Absolute: 0.7 10*3/uL (ref 0.1–0.9)
Monocytes: 9 %
Neutrophils Absolute: 5.1 10*3/uL (ref 1.4–7.0)
Neutrophils: 69 %
Platelets: 327 10*3/uL (ref 150–450)
RBC: 4.38 x10E6/uL (ref 3.77–5.28)
RDW: 15.2 % (ref 11.7–15.4)
WBC: 7.4 10*3/uL (ref 3.4–10.8)

## 2020-06-23 LAB — LIPID PANEL WITH LDL/HDL RATIO
Cholesterol, Total: 154 mg/dL (ref 100–199)
HDL: 50 mg/dL (ref >39)
LDL Chol Calc (NIH): 87 mg/dL (ref 0–99)
LDL/HDL Ratio: 1.7 ratio (ref 0.0–3.2)
Triglycerides: 89 mg/dL (ref 0–149)
VLDL Cholesterol Cal: 17 mg/dL (ref 5–40)

## 2020-06-23 LAB — HEMOGLOBIN A1C
Est. average glucose Bld gHb Est-mCnc: 126 mg/dL
Hgb A1c MFr Bld: 6 % — ABNORMAL HIGH (ref 4.8–5.6)

## 2020-06-23 LAB — VITAMIN D 25 HYDROXY (VIT D DEFICIENCY, FRACTURES): Vit D, 25-Hydroxy: 15.8 ng/mL — ABNORMAL LOW (ref 30.0–100.0)

## 2020-06-23 LAB — TSH: TSH: 1.42 u[IU]/mL (ref 0.450–4.500)

## 2020-06-23 LAB — T4: T4, Total: 8.2 ug/dL (ref 4.5–12.0)

## 2020-06-23 LAB — FOLATE: Folate: 6.1 ng/mL (ref >3.0)

## 2020-06-23 LAB — INSULIN, RANDOM: INSULIN: 20.3 u[IU]/mL (ref 2.6–24.9)

## 2020-06-23 LAB — T3: T3, Total: 94 ng/dL (ref 71–180)

## 2020-06-28 ENCOUNTER — Telehealth (INDEPENDENT_AMBULATORY_CARE_PROVIDER_SITE_OTHER): Payer: 59 | Admitting: Psychology

## 2020-06-28 DIAGNOSIS — F3289 Other specified depressive episodes: Secondary | ICD-10-CM

## 2020-07-06 ENCOUNTER — Other Ambulatory Visit: Payer: Self-pay

## 2020-07-06 ENCOUNTER — Encounter (INDEPENDENT_AMBULATORY_CARE_PROVIDER_SITE_OTHER): Payer: Self-pay | Admitting: Family Medicine

## 2020-07-06 ENCOUNTER — Ambulatory Visit (INDEPENDENT_AMBULATORY_CARE_PROVIDER_SITE_OTHER): Payer: 59 | Admitting: Family Medicine

## 2020-07-06 ENCOUNTER — Ambulatory Visit (INDEPENDENT_AMBULATORY_CARE_PROVIDER_SITE_OTHER): Payer: 59

## 2020-07-06 VITALS — BP 138/69 | HR 91 | Temp 98.7°F | Ht 64.0 in | Wt >= 6400 oz

## 2020-07-06 DIAGNOSIS — E538 Deficiency of other specified B group vitamins: Secondary | ICD-10-CM

## 2020-07-06 DIAGNOSIS — E559 Vitamin D deficiency, unspecified: Secondary | ICD-10-CM | POA: Diagnosis not present

## 2020-07-06 DIAGNOSIS — Z9189 Other specified personal risk factors, not elsewhere classified: Secondary | ICD-10-CM

## 2020-07-06 DIAGNOSIS — E1169 Type 2 diabetes mellitus with other specified complication: Secondary | ICD-10-CM | POA: Diagnosis not present

## 2020-07-06 DIAGNOSIS — Z7901 Long term (current) use of anticoagulants: Secondary | ICD-10-CM | POA: Diagnosis not present

## 2020-07-06 DIAGNOSIS — Z6841 Body Mass Index (BMI) 40.0 and over, adult: Secondary | ICD-10-CM

## 2020-07-06 DIAGNOSIS — E66813 Obesity, class 3: Secondary | ICD-10-CM

## 2020-07-06 LAB — POCT INR: INR: 2.1 (ref 2.0–3.0)

## 2020-07-06 MED ORDER — VITAMIN D (ERGOCALCIFEROL) 1.25 MG (50000 UNIT) PO CAPS: 50000.0000 [IU] | ORAL_CAPSULE | ORAL | 0 refills | Status: DC

## 2020-07-06 NOTE — Patient Instructions (Signed)
Continue taking 1.5 tablets daily except 1 tablet on Tuesday.   INR in 6 weeks

## 2020-07-07 ENCOUNTER — Telehealth: Payer: Self-pay

## 2020-07-07 NOTE — Telephone Encounter (Signed)
Spoke with pt who states she is not currently using CPAP at night

## 2020-07-11 ENCOUNTER — Ambulatory Visit (INDEPENDENT_AMBULATORY_CARE_PROVIDER_SITE_OTHER): Payer: 59 | Admitting: Pulmonary Disease

## 2020-07-11 ENCOUNTER — Encounter: Payer: Self-pay | Admitting: Pulmonary Disease

## 2020-07-11 ENCOUNTER — Other Ambulatory Visit: Payer: Self-pay

## 2020-07-11 VITALS — BP 140/72 | HR 88 | Temp 98.4°F | Ht 64.0 in | Wt >= 6400 oz

## 2020-07-11 DIAGNOSIS — E662 Morbid (severe) obesity with alveolar hypoventilation: Secondary | ICD-10-CM

## 2020-07-11 DIAGNOSIS — J9611 Chronic respiratory failure with hypoxia: Secondary | ICD-10-CM

## 2020-07-11 DIAGNOSIS — J9612 Chronic respiratory failure with hypercapnia: Secondary | ICD-10-CM | POA: Diagnosis not present

## 2020-07-11 DIAGNOSIS — G4733 Obstructive sleep apnea (adult) (pediatric): Secondary | ICD-10-CM | POA: Diagnosis not present

## 2020-07-11 NOTE — Patient Instructions (Signed)
Will arrange for CPAP titration study  Follow up in 6 months

## 2020-07-11 NOTE — Progress Notes (Signed)
[facility]  Chief Complaint  Patient presents with  . Follow-up    Wants to start Cpap therapy    Constitutional:  BP 140/72 (BP Location: Left Arm, Cuff Size: Normal)   Pulse 88   Temp 98.4 F (36.9 C) (Oral)   Ht 5\' 4"  (1.626 m)   Wt (!) 437 lb 3.2 oz (198.3 kg)   SpO2 92%   BMI 75.05 kg/m   Past Medical History:  Diastolic CHF, Hypertension, DM type 2, Unprovoked PE 2018, Vit D deficiency  Past Surgical History:  She  has a past surgical history that includes Cesarean section.  Brief Summary:  Alexa [name] is a 45 y.o. female former smoker with chronic respiratory failure and pulmonary hypertension in setting of OSA and OHS.  She also has hx of unprovoked PE.      Subjective:   I last saw her in April 2019.  She had home sleep study then that showed moderate sleep apnea and significant oxygen desaturation.  She was to have an in lab titration study, but then the COVID 19 pandemic hit and her sleep study was not able to be rescheduled until now.    She has home oxygen set up, but hasn't been using.  Gets supplies through [employer].  She doesn't have trouble falling asleep.  She wakes up feeling choked.  She gets up a few times to use the bathroom.  She feels sleepy during the day.  She does snore.  She has been getting headaches in the morning.  She is enrolled with a weight loss clinic.  SpO2 on room air while walking today was 87%.  She was recovered on 3 liters oxygen to 93%.  Physical Exam:   Appearance - well kempt   ENMT - no sinus tenderness, no oral exudate, no LAN, Mallampati 4 airway, no stridor, scalloped tongue  Respiratory - equal breath sounds bilaterally, no wheezing or rales  CV - s1s2 regular rate and rhythm, no murmurs  Ext - no clubbing, 1+ ankle edema  Skin - no rashes  Psych - normal mood and affect   Chest Imaging:   CT angio chest 05/14/16 >> acute PE with RV:LV ratio 1.26  Sleep  Tests:   PSG 10/11/16 >> AHI 82, SpO2 low 50%  HST 08/02/17 >> 24.1, SpO2 low 52%.  Spent 167.3 min of test time with SpO2 < 89%.  Cardiac Tests:   Echo 03/28/20 >> EF 60 to 65%, mild LVH, grade 1 DD, mod RV enlargement  Social History:  She  reports that she has quit smoking. Her smoking use included cigarettes. She has never used smokeless tobacco. She reports current alcohol use of about 4.0 - 6.0 standard drinks of alcohol per week. She reports that she does not use drugs.  Family History:  Her family history includes Brain cancer in her maternal grandmother; Breast cancer in her maternal grandmother; Breast cancer (age of onset: 43) in her maternal aunt and maternal aunt; Diabetes in her maternal grandmother; Heart disease in her father; Hypertension in her mother; Obesity in her father and mother.     Assessment/Plan:   Chronic hypoxic/hypercapnic respiratory failure. - in setting of obstructive sleep apnea and obesity hypoventilation syndrome - reviewed her home sleep study results with her - will arrange for in lab CPAP titration study - she has home oxygen set up through [employer] - she should continue using supplemental oxygen during the day with exertion  Morbid  obesity. - she is enrolled with weight loss clinic  Hx of unprovoked pulmonary embolism - followed in coumadin clinic  Chronic diastolic CHF. - followed by Dr. Quay Burow with [facility]  Time Spent Involved in Patient Care on Day of Examination:  32 minutes  Follow up:  Patient Instructions  Will arrange for CPAP titration study  Follow up in 6 months   Medication List:   Allergies as of 07/11/2020      Reactions   Penicillins Hives   Has patient had a PCN reaction causing immediate rash, facial/tongue/throat swelling, SOB or lightheadedness with hypotension: No Has patient had a PCN reaction causing severe rash involving mucus membranes or skin necrosis: No Has patient had a PCN reaction  that required hospitalization No Has patient had a PCN reaction occurring within the last 10 years: No If all of the above answers are "NO", then may proceed with Cephalosporin use.      Medication List       Accurate as of July 11, 2020 10:43 AM. If you have any questions, ask your nurse or doctor.        acetaminophen 500 MG tablet Commonly known as: TYLENOL Take 1,000 mg by mouth daily as needed for mild pain.   blood glucose meter kit and supplies Kit Use up to four times daily as directed.   cetirizine 10 MG tablet Commonly known as: ZYRTEC Take 1 tablet (10 mg total) by mouth daily.   Dulaglutide 0.75 MG/0.5ML Sopn Commonly known as: Trulicity Inject 3.15 mg into the skin once a week.   furosemide 40 MG tablet Commonly known as: LASIX TAKE 1 AND 1/2 TABLETS(60 MG) BY MOUTH TWICE DAILY   gabapentin 300 MG capsule Commonly known as: NEURONTIN TAKE 1 CAPSULE(300 MG) BY MOUTH TWICE DAILY   glucose blood test strip ascensia  Meter/strips preferred Test daily Use as instructed Dx.dm   metFORMIN 500 MG tablet Commonly known as: GLUCOPHAGE Take 1 tablet (500 mg total) by mouth 2 (two) times daily with a meal.   Misc. Devices Misc Bariatric size shower chair with handle 423 lbs, BMI 72  Dx. Pulmonary HTN, DM2 with neuropathy, morbid obesity   onetouch ultrasoft lancets Test daily Use as instructed dm2   potassium chloride 10 MEQ tablet Commonly known as: KLOR-CON TAKE 1 TABLET(10 MEQ) BY MOUTH TWICE DAILY   ergocalciferol 1.25 MG (50000 UT) capsule Commonly known as: VITAMIN D2 Take 50,000 Units by mouth once a week.   Vitamin D (Ergocalciferol) 1.25 MG (50000 UNIT) Caps capsule Commonly known as: DRISDOL Take 1 capsule (50,000 Units total) by mouth every 7 (seven) days.   warfarin 7.5 MG tablet Commonly known as: COUMADIN Take as directed by the anticoagulation clinic. If you are unsure how to take this medication, talk to your nurse or  doctor. Original instructions: TAKE 1 AND 1/2 TO 2 TABLETS BY MOUTH DAILY AS DIRECTED BY COUMADIN CLINIC       Signature:  Chesley Mires, MD [place] Pager - [phone] 07/11/2020, 10:43 AM

## 2020-07-12 NOTE — Progress Notes (Signed)
Chief Complaint:   Alexa Alexa [name] (MR# [id number]) is a pleasant 45 y.o. female who is here to work on weight Alexa [name]. Current BMI is Body mass index is 74.5 kg/m. Alexa Alexa [name] is medically compliant with multiple cardiac Alexa pulmonary health issues, which are likely to improve with weight Alexa [name].   Alexa Alexa [name] is currently in the action stage of change Alexa ready to dedicate time achieving Alexa maintaining a healthier weight. Alexa Alexa [name] is interested in becoming our patient Alexa working on intensive lifestyle modifications including (but not limited to) Alexa [name] Alexa Alexa for weight Alexa [name].  Alexa Alexa [name]'s habits were reviewed today Alexa are as follows: Her family eats meals together, she thinks her family will eat healthier with her, her desired weight Alexa [name] is 184 lbs, she has been heavy most of her life, she started gaining weight about 14 or 15, her heaviest weight ever was 460-470 pounds, she has significant food cravings issues, she snacks frequently in the evenings, she skips meals frequently, she is trying to follow a vegetarian Alexa [name], she is frequently drinking liquids with calories, she frequently makes poor food choices Alexa she struggles with emotional eating.  Depression Screen Alexa Alexa [name]'s Food Alexa Mood (modified PHQ-9) score was 17.  Depression screen [facility] 2/9 06/22/2020  Decreased Interest 3  Down, Depressed, Hopeless 2  PHQ - 2 Score 5  Altered sleeping 1  Tired, decreased energy 3  Change in appetite 3  Feeling bad or failure about yourself  1  Trouble concentrating 1  Moving slowly or fidgety/restless 3  Suicidal thoughts 0  PHQ-9 Score 17  Difficult doing work/chores -  Some recent data might be hidden   Subjective:   1. Other fatigue Alexa [name] admits to daytime somnolence Alexa admits to waking up still tired. Patent has a history of symptoms of daytime fatigue. Alexa Alexa [name] generally gets 5 or 7 hours of sleep per night, Alexa states that she has nightime awakenings Alexa generally restful sleep.  Snoring is present. Apneic episodes are present. Epworth Sleepiness Score is 20.  2. Shortness of breath on exertion Alexa [name] has a history of cor pulmonale, COPD, congestive heart failure, Alexa pulmonary hypertension. She also notes increased shortness of breath with Alexa, which her weight likely contributes to.  3. Type 2 Alexa [name] mellitus with other specified complication, without long-term current use of insulin (HCC) Alexa Alexa [name]'s blood sugar is well controlled with fasting BGs mostly below 120. She is on metformin.  4. Mixed hyperlipidemia Alexa Alexa [name] is not on statin, Alexa she is working on Alexa [name] Alexa Alexa. She has a cardiac history.  5. Vitamin D deficiency Alexa Alexa [name] is on Vit D, Alexa she has no recent labs. She notes fatigue.  6. At risk for impaired metabolic function Alexa Alexa [name] is at increased risk for impaired metabolic function due to current nutrition Alexa muscle mass.  Assessment/Plan:   1. Other fatigue Alexa [name] does feel that her weight is causing her energy to be lower than it should be. Fatigue may be related to Alexa, depression or many other causes. Labs will be ordered, Alexa in the meanwhile, Alexa Alexa [name] will focus on self care including making healthy food choices, increasing physical activity Alexa focusing on stress reduction.  - Vitamin B12 - CBC with Differential/Platelet - Comprehensive metabolic panel - EKG 34-HDQQ - Folate - Lipid Panel With LDL/HDL Ratio - T3 - T4 - TSH  2. Shortness of breath on exertion Alexa [name] will work on Alexa [name], Alexa, Alexa weight Alexa [name] to treat her Alexa induced shortness of breath. Will continue  to monitor closely.  3. Type 2 Alexa [name] mellitus with other specified complication, without long-term current use of insulin (HCC) Alexa Alexa [name], Alexa Alexa [name], Alexa Alexa [name], Alexa Alexa [name], Alexa Alexa [name], Alexa Alexa [name],  Alexa Alexa [name]. We will check labs today. Alexa Alexa [name] will start her Category 3 plan, Alexa will continue to follow up as directed.  - Hemoglobin A1c - Insulin, random  4. Mixed hyperlipidemia Cardiovascular risk Alexa specific lipid/LDL goals reviewed. We discussed several lifestyle modifications today. We will check labs today. Alexa [name] will continue to work on Alexa [name], Alexa Alexa weight Alexa [name] efforts. Orders Alexa follow up as documented in patient record.   - Lipid Panel With LDL/HDL Ratio  5. Vitamin D deficiency Low Vitamin D level contributes to fatigue Alexa are associated with Alexa, breast, Alexa colon cancer. We will check labs today. Alexa [name] will follow-up for routine testing of Vitamin D, at least 2-3 times per year to avoid over-replacement.  - VITAMIN D 25 Hydroxy (Vit-D Deficiency, Fractures)  6. Screening for depression Alexa [name] had a positive depression screening. Depression is commonly associated with Alexa Alexa often results in emotional eating behaviors. We will monitor this closely Alexa work on CBT to help improve the non-hunger eating patterns. Referral to Psychology may be required if no improvement is seen as she continues in our clinic.  7. At risk for impaired metabolic function Alexa Alexa [name] was given approximately 30 minutes of impaired  metabolic function prevention counseling today. We discussed intensive lifestyle modifications today with an emphasis on specific nutrition Alexa Alexa instructions Alexa strategies.   Repetitive spaced learning was employed today to elicit superior memory formation Alexa behavioral change.  8. 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 I recommend Alexa Alexa [name] begin the structured treatment plan as  follows:  She has agreed to the Category 3 Plan.  Alexa goals: No Alexa has been prescribed for now, while we concentrate on nutritional changes.   Behavioral modification strategies: increasing lean protein intake, no skipping meals Alexa dealing with family or coworker sabotage.  She was informed of the importance of frequent follow-up visits to maximize her success with intensive lifestyle modifications for her multiple health conditions. She was informed we would discuss her lab results at her next visit unless there is a critical issue that needs to be addressed sooner. Rogenia agreed to keep her next visit at the agreed upon time to discuss these results.  Objective:   Blood pressure (!) 171/70, pulse 79, temperature 98.2 F (36.8 C), height 5\' 4"  (1.626 m), weight (!) 434 lb (196.9 kg), SpO2 97 %. Body mass index is 74.5 kg/m.  EKG: Normal sinus rhythm, rate 81 BPM.  Indirect Calorimeter completed today shows a VO2 of 484 Alexa a REE of 3369.  Her calculated basal metabolic rate is 4174 thus her basal metabolic rate is better than expected.  General: Cooperative, alert, well developed, in no acute distress. HEENT: Conjunctivae Alexa lids unremarkable. Cardiovascular: Regular rhythm.  Lungs: Normal work of breathing. Neurologic: No focal deficits.   Lab Results  Component Value Date   CREATININE 0.85 06/22/2020   BUN 10 06/22/2020   NA 138 06/22/2020   K  4.9 06/22/2020   CL 97 06/22/2020   CO2 26 06/22/2020   Lab Results  Component Value Date   ALT 17 06/22/2020   AST 18 06/22/2020   ALKPHOS 57 06/22/2020   BILITOT 0.4 06/22/2020   Lab Results  Component Value Date   HGBA1C 6.0 (H) 06/22/2020   HGBA1C 5.8 (H) 03/02/2020   HGBA1C 6.1 (A) 02/19/2018   HGBA1C 6.1 07/30/2017   HGBA1C 5.7 (H) 06/10/2017   Lab Results  Component Value Date   INSULIN 20.3 06/22/2020   Lab Results  Component Value Date   TSH 1.420 06/22/2020   Lab Results  Component Value Date    CHOL 154 06/22/2020   HDL 50 06/22/2020   LDLCALC 87 06/22/2020   LDLDIRECT 100 10/08/2015   TRIG 89 06/22/2020   CHOLHDL 3.9 03/02/2020   Lab Results  Component Value Date   WBC 7.4 06/22/2020   HGB 13.6 06/22/2020   HCT 40.7 06/22/2020   MCV 93 06/22/2020   PLT 327 06/22/2020   Lab Results  Component Value Date   IRON 38 (L) 10/08/2015   TIBC 416 10/08/2015   FERRITIN 27 10/08/2015   Attestation Statements:   Reviewed by clinician on day of visit: allergies, medications, problem list, medical history, surgical history, family history, social history, Alexa previous encounter notes.   I, Trixie Dredge, am acting as transcriptionist for Dennard Nip, MD.  I have reviewed the above documentation for accuracy Alexa completeness, Alexa I agree with the above. - Dennard Nip, MD

## 2020-07-13 ENCOUNTER — Telehealth (INDEPENDENT_AMBULATORY_CARE_PROVIDER_SITE_OTHER): Payer: 59 | Admitting: Psychology

## 2020-07-13 ENCOUNTER — Other Ambulatory Visit: Payer: Self-pay

## 2020-07-13 DIAGNOSIS — F3289 Other specified depressive episodes: Secondary | ICD-10-CM | POA: Diagnosis not present

## 2020-07-13 NOTE — Progress Notes (Signed)
Office: [phone]  /  Fax: [phone]    Date: July 13, 2020    Appointment Start Time: 8:39am Duration: 22 minutes Provider: Glennie Isle, Psy.D. Type of Session: Individual Therapy  Location of Patient: Home Location of Provider: Provider's Home (private office) Type of Contact: Telepsychological Visit via MyChart Video Visit  Session Content: This provider called Alexa [name] at 8:32am as she did not present for the telepsychological appointment. She appeared to have forgotten today's appointment as evidenced by her asking, "It's right now?" Nonetheless, she indicated she could join. This provider called again at 8:37am as Alexa [name] did not present; further assistance was provided. As such, today's appointment was initiated 9 minutes late. Alexa [name] is a 45 y.o. female presenting for a follow-up appointment to address the previously established treatment goal of increasing coping skills. Today's appointment was a telepsychological visit due to COVID-19. Alexa [name] provided verbal consent for today's telepsychological appointment and she is aware she is responsible for securing confidentiality on her end of the session. Prior to proceeding with today's appointment, Alexa [name]'s physical location at the time of this appointment was obtained as well a phone number she could be reached at in the event of technical difficulties. Alexa [name] and this provider participated in today's telepsychological service.   This provider explained today's appointment would be shorter given the late start; she acknowledged understanding and provided verbal consent to proceed. This provider conducted a brief check-in. Alexa [name] shared about recent events, noting she has been busy. Additionally, she indicated she did not have the opportunity to look at the handout previously shared. Thus, she provided verbal consent for this provider to re-send it via e-mail. Session focused on discussing the difference between emotional and physical  hunger as well as Alexa [name]'s recent eating habits. It was reflected she has not been eating regularly. As such, consequences of not eating regularly were discussed. Moreover, she was engaged in problem solving to help her eat regularly (e.g., setting reminders on her phone to check in about hunger cues) and make sure she is eating everything on her structured meal plan (e.g., roasting vegetables for a couple meals; designating specific areas in pantry/refrigerator with snacks congruent to the meal plan for easy access during the work day, taking lunch box to practice/rehearsal). Overall, Alexa [name] was receptive to today's appointment as evidenced by openness to sharing, responsiveness to feedback, and willingness to implement discussed strategies.   Mental Status Examination:  Appearance: well groomed and appropriate hygiene  Behavior: appropriate to circumstances Mood: euthymic Affect: mood congruent Speech: normal in rate, volume, and tone Eye Contact: appropriate Psychomotor Activity: unable to assess  Gait: unable to assess Thought Process: linear, logical, and goal directed  Thought Content/Perception: no hallucinations, delusions, bizarre thinking or behavior reported or observed and no evidence or endorsement of suicidal and homicidal ideation, plan, and intent Orientation: time, person, place, and purpose of appointment Memory/Concentration: memory, attention, language, and fund of knowledge intact  Insight/Judgment: fair  Interventions:  Conducted a brief chart review Provided empathic reflections and validation Employed supportive psychotherapy interventions to facilitate reduced distress and to improve coping skills with identified stressors Engaged patient in problem solving Psychoeducation provided regarding physical versus emotional hunger  DSM-5 Diagnosis(es): 311 (F32.8) Other Specified Depressive Disorder, Emotional Eating Behaviors  Treatment Goal & Progress: During the initial  appointment with this provider, the following treatment goal was established: increase coping skills. Progress is limited, as Alexa [name] has just begun treatment with this provider; however, she is receptive to the interaction and interventions and  rapport is being established.   Plan: The next appointment will be scheduled in two weeks, which will be via MyChart Video Visit. The next session will focus on working towards the established treatment goal.

## 2020-07-13 NOTE — Progress Notes (Signed)
  Office: [phone]  /  Fax: [phone]    Date: Jul 27, 2020   Appointment Start Time: 8:31am Duration: 24 minutes Provider: Glennie Isle, Psy.D. Type of Session: Individual Therapy  Location of Patient: Home Location of Provider: Provider's Home (private office) Type of Contact: Telepsychological Visit via MyChart Video Visit  Session Content: Alexa [name] is a 45 y.o. female presenting for a follow-up appointment to address the previously established treatment goal of increasing coping skills. Today's appointment was a telepsychological visit due to COVID-19. [name] provided verbal consent for today's telepsychological appointment and she is aware she is responsible for securing confidentiality on her end of the session. Prior to proceeding with today's appointment, [name]'s physical location at the time of this appointment was obtained as well a phone number she could be reached at in the event of technical difficulties. [name] and this provider participated in today's telepsychological service.   This provider conducted a brief check-in. Regarding eating, [name] reported she is trying to "eat on time" and regularly, adding, "That's still a struggle there." She indicated she tried to pack a lunchbox more often and she is roasting vegetables more, adding she lost four pounds. Positive reinforcement was provided. Based on [name] reporting she is still not eating regularly and going long periods without food, psychoeducation regarding the hunger and satisfaction scale was provided. [name] provided verbal consent during today's appointment for this provider to send the handout with the scale via e-mail. She was encouraged to use the scale every couple hours to check-in using the scale to increase awareness of hunger patterns and to help her eat regularly. Obstacles/barriers that may prevent [name] from following through with the aforementioned were explored and discussed. She discussed a plan to put  copies of the scale throughout the house as visual reminders to check-in. [name] was receptive to today's appointment as evidenced by openness to sharing, responsiveness to feedback, and willingness to utilize the scale.   Mental Status Examination:  Appearance: well groomed and appropriate hygiene  Behavior: appropriate to circumstances Mood: euthymic Affect: mood congruent Speech: normal in rate, volume, and tone Eye Contact: appropriate Psychomotor Activity: unable to assess  Gait: unable to assess Thought Process: linear, logical, and goal directed  Thought Content/Perception: no hallucinations, delusions, bizarre thinking or behavior reported or observed and no evidence or endorsement of suicidal and homicidal ideation, plan, and intent Orientation: time, person, place, and purpose of appointment Memory/Concentration: memory, attention, language, and fund of knowledge intact  Insight/Judgment: fair  Interventions:  Conducted a brief chart review Provided empathic reflections and validation Reviewed content from the previous session Provided positive reinforcement Employed supportive psychotherapy interventions to facilitate reduced distress and to improve coping skills with identified stressors Psychoeducation provided regarding the hunger and satisfaction scale  DSM-5 Diagnosis(es): F32.89 Other Specified Depressive Disorder, Emotional Eating Behaviors  Treatment Goal & Progress: During the initial appointment with this provider, the following treatment goal was established: increase coping skills. [name] has demonstrated some progress in her goal as evidenced by increased awareness of hunger patterns. [name] also continues to demonstrate willingness to engage in learned skill(s).  Plan: The next appointment will be scheduled in approximately three weeks, which will be via MyChart Video Visit. The next session will focus on working towards the established treatment goal. Her next  appointment with Dr. Leafy Ro is on Aug 08, 2020.

## 2020-07-14 ENCOUNTER — Ambulatory Visit (INDEPENDENT_AMBULATORY_CARE_PROVIDER_SITE_OTHER): Payer: 59 | Admitting: Podiatry

## 2020-07-14 ENCOUNTER — Other Ambulatory Visit: Payer: Self-pay

## 2020-07-14 DIAGNOSIS — Z7901 Long term (current) use of anticoagulants: Secondary | ICD-10-CM

## 2020-07-14 DIAGNOSIS — L84 Corns and callosities: Secondary | ICD-10-CM

## 2020-07-14 DIAGNOSIS — E11628 Type 2 diabetes mellitus with other skin complications: Secondary | ICD-10-CM | POA: Diagnosis not present

## 2020-07-14 DIAGNOSIS — B351 Tinea unguium: Secondary | ICD-10-CM | POA: Diagnosis not present

## 2020-07-14 DIAGNOSIS — M79675 Pain in left toe(s): Secondary | ICD-10-CM

## 2020-07-14 DIAGNOSIS — M79674 Pain in right toe(s): Secondary | ICD-10-CM | POA: Diagnosis not present

## 2020-07-14 NOTE — Progress Notes (Signed)
Chief Complaint:   OBESITY Alexa [name] is here to discuss her progress with her obesity treatment plan along with follow-up of her obesity related diagnoses. Alexa [name] is on the Category 3 Plan and states she is following her eating plan approximately 50% of the time. Alexa [name] states she is dancing and walking for 40 minutes 2 times per week.  Today's visit was #: 2 Starting weight: 434 lbs Starting date: 06/22/2020 Today's weight: 426 lbs Today's date: 07/06/2020 Total lbs lost to date: 8 Total lbs lost since last in-office visit: 8  Interim History: Alexa [name] has done well with weight loss on her plan. She wasn't able to follow her plan closely, but she tried to follow the gist of the plan. She tried to decrease fats and sugar, and increase vegetables.  Subjective:   1. Vitamin D deficiency Alexa [name] is on OTC Vit D, but her level is still far below goal. Her Ca+ is low as well. I discussed labs with the patient today.  2. Vitamin B12 deficiency Alexa [name] has decreased her protein intake previously and her B12 is low. She notes fatigue, but denies anemia. She is now on a B12 rich diet. I discussed labs with the patient today.  3. Type 2 diabetes mellitus with other specified complication, without long-term current use of insulin (HCC) Alexa [name]'s fasting BGs mostly range below 120 on metformin. She denies signs of hypoglycemia, and she has not had a recent foot exam. I discussed labs with the patient today.  4. At risk for heart disease [name] is at a higher than average risk for cardiovascular disease due to obesity.   Assessment/Plan:   1. Vitamin D deficiency Low Vitamin D level contributes to fatigue and are associated with obesity, breast, and colon cancer. Alexa [name] agreed to start prescription Vitamin D 50,000 IU every week with no refills, and she will continue OTC Vit D as well. She will follow-up for routine testing of Vitamin D, at least 2-3 times per year to avoid over-replacement.  -  Vitamin D, Ergocalciferol, (DRISDOL) 1.25 MG (50000 UNIT) CAPS capsule; Take 1 capsule (50,000 Units total) by mouth every 7 (seven) days.  Dispense: 4 capsule; Refill: 0  2. Vitamin B12 deficiency The diagnosis was reviewed with the patient. Alexa [name] will continue with her B12 rich diet, and we will recheck labs in 3 months. Orders and follow up as documented in patient record.  3. Type 2 diabetes mellitus with other specified complication, without long-term current use of insulin (HCC) Good blood sugar control is important to decrease the likelihood of diabetic complications such as nephropathy, neuropathy, limb loss, blindness, coronary artery disease, and death. Intensive lifestyle modification including diet, exercise and weight loss are the first line of treatment for diabetes. [name] will continue diet and exercise, and we will recheck labs in 3 months. We will refer Alexa [name] to [facility], and she is to schedule her appointment.  - Ambulatory referral to Podiatry  4. At risk for heart disease Alexa [name] was given approximately 30 minutes of coronary artery disease prevention counseling today. She is 46 y.o. female and has risk factors for heart disease including obesity. We discussed intensive lifestyle modifications today with an emphasis on specific weight loss instructions and strategies.   Repetitive spaced learning was employed today to elicit superior memory formation and behavioral change.  5. Obesity with current BMI of 73.12 Alexa [name] is currently in the action stage of change. As such, her goal is to continue with weight loss  efforts. She has agreed to the Category 3 Plan.   Exercise goals: As is.  Behavioral modification strategies: increasing lean protein intake and meal planning and cooking strategies.  Alexa [name] has agreed to follow-up with our clinic in 2 weeks. She was informed of the importance of frequent follow-up visits to maximize her success with intensive lifestyle  modifications for her multiple health conditions.   Objective:   Blood pressure 138/69, pulse 91, temperature 98.7 F (37.1 C), height 5\' 4"  (1.626 m), weight (!) 426 lb (193.2 kg), SpO2 94 %. Body mass index is 73.12 kg/m.  General: Cooperative, alert, well developed, in no acute distress. HEENT: Conjunctivae and lids unremarkable. Cardiovascular: Regular rhythm.  Lungs: Normal work of breathing. Neurologic: No focal deficits.   Lab Results  Component Value Date   CREATININE 0.85 06/22/2020   BUN 10 06/22/2020   NA 138 06/22/2020   K 4.9 06/22/2020   CL 97 06/22/2020   CO2 26 06/22/2020   Lab Results  Component Value Date   ALT 17 06/22/2020   AST 18 06/22/2020   ALKPHOS 57 06/22/2020   BILITOT 0.4 06/22/2020   Lab Results  Component Value Date   HGBA1C 6.0 (H) 06/22/2020   HGBA1C 5.8 (H) 03/02/2020   HGBA1C 6.1 (A) 02/19/2018   HGBA1C 6.1 07/30/2017   HGBA1C 5.7 (H) 06/10/2017   Lab Results  Component Value Date   INSULIN 20.3 06/22/2020   Lab Results  Component Value Date   TSH 1.420 06/22/2020   Lab Results  Component Value Date   CHOL 154 06/22/2020   HDL 50 06/22/2020   LDLCALC 87 06/22/2020   LDLDIRECT 100 10/08/2015   TRIG 89 06/22/2020   CHOLHDL 3.9 03/02/2020   Lab Results  Component Value Date   WBC 7.4 06/22/2020   HGB 13.6 06/22/2020   HCT 40.7 06/22/2020   MCV 93 06/22/2020   PLT 327 06/22/2020   Lab Results  Component Value Date   IRON 38 (L) 10/08/2015   TIBC 416 10/08/2015   FERRITIN 27 10/08/2015   Attestation Statements:   Reviewed by clinician on day of visit: allergies, medications, problem list, medical history, surgical history, family history, social history, and previous encounter notes.   I, Trixie Dredge, am acting as transcriptionist for Dennard Nip, MD.  I have reviewed the above documentation for accuracy and completeness, and I agree with the above. -  Dennard Nip, MD

## 2020-07-17 NOTE — Progress Notes (Signed)
  Subjective:  Patient ID: Alexa [name], female    DOB: 1975-06-22,  MRN: [id number]  Chief Complaint  Patient presents with  . Nail Problem    DIABETIC FOOT CARE    45 y.o. female presents with the above complaint. History confirmed with patient.  Her A1c is 6.0%.  She is also on warfarin  Objective:  Physical Exam: warm, good capillary refill, no trophic changes or ulcerative lesions, normal DP and PT pulses and normal sensory exam.  Significant dependent edema bilaterally.  Thickened elongated toenails 1 through 5 bilateral with dystrophy and subungual debris.  She has a large callus on the plantar heel left foot Assessment:   1. Pain due to onychomycosis of toenails of both feet   2. Type 2 diabetes mellitus with other skin complication, without long-term current use of insulin (HCC)   3. Callus of foot   4. Long term current use of anticoagulant      Plan:  Patient was evaluated and treated and all questions answered.  Patient educated on diabetes. Discussed proper diabetic foot care and discussed risks and complications of disease. Educated patient in depth on reasons to return to the office immediately should he/she discover anything concerning or new on the feet. All questions answered. Discussed proper shoes as well.   Discussed the etiology and treatment options for the condition in detail with the patient. Educated patient on the topical and oral treatment options for mycotic nails. Recommended debridement of the nails today. Sharp and mechanical debridement performed of all painful and mycotic nails today. Nails debrided in length and thickness using a nail nipper to level of comfort. Discussed treatment options including appropriate shoe gear. Follow up as needed for painful nails.  All symptomatic hyperkeratoses were safely debrided with a sterile #15 blade to patient's level of comfort without incident. We discussed preventative and palliative care of these lesions  including supportive and accommodative shoegear, padding, prefabricated and custom molded accommodative orthoses, use of a pumice stone and lotions/creams daily.   Return in about 3 months (around 10/13/2020) for at risk diabetic foot care with Dr Prudence Davidson or Dr Elisha Ponder .

## 2020-07-25 ENCOUNTER — Ambulatory Visit (INDEPENDENT_AMBULATORY_CARE_PROVIDER_SITE_OTHER): Payer: 59 | Admitting: Family Medicine

## 2020-07-25 ENCOUNTER — Other Ambulatory Visit: Payer: Self-pay

## 2020-07-25 ENCOUNTER — Encounter (INDEPENDENT_AMBULATORY_CARE_PROVIDER_SITE_OTHER): Payer: Self-pay | Admitting: Family Medicine

## 2020-07-25 VITALS — BP 139/82 | HR 92 | Temp 98.4°F | Ht 64.0 in | Wt >= 6400 oz

## 2020-07-25 DIAGNOSIS — E66813 Obesity, class 3: Secondary | ICD-10-CM

## 2020-07-25 DIAGNOSIS — Z6841 Body Mass Index (BMI) 40.0 and over, adult: Secondary | ICD-10-CM | POA: Diagnosis not present

## 2020-07-25 DIAGNOSIS — G5793 Unspecified mononeuropathy of bilateral lower limbs: Secondary | ICD-10-CM

## 2020-07-25 DIAGNOSIS — Z9189 Other specified personal risk factors, not elsewhere classified: Secondary | ICD-10-CM | POA: Diagnosis not present

## 2020-07-25 DIAGNOSIS — E1169 Type 2 diabetes mellitus with other specified complication: Secondary | ICD-10-CM

## 2020-07-25 MED ORDER — METFORMIN HCL 500 MG PO TABS: 500.0000 mg | ORAL_TABLET | Freq: Two times a day (BID) | ORAL | 0 refills | Status: DC

## 2020-07-25 MED ORDER — GABAPENTIN 300 MG PO CAPS: ORAL_CAPSULE | ORAL | 0 refills | Status: DC

## 2020-07-26 NOTE — Progress Notes (Signed)
Chief Complaint:   OBESITY Alexa [name] is here to discuss her progress with her obesity treatment plan along with follow-up of her obesity related diagnoses. Alexa [name] is on the Category 3 Plan and states she is following her eating plan approximately 80% of the time. Alexa [name] states she is doing 0 minutes 0 times per week.  Today's visit was #: 3 Starting weight: 434 lbs Starting date: 06/22/2020 Today's weight: 430 lbs Today's date: 07/25/2020 Total lbs lost to date: 4 Total lbs lost since last in-office visit: 4  Interim History: Alexa [name] continues to do well with weight loss on her Category 2 plan. She is getting ready to start some exercise and she is open to discussing options. Her hunger is controlled and she is getting more used to the meal plan.  Subjective:   1. Neuropathy involving both lower extremities Alexa [name] is stable on gabapentin. She is in the process of finding a new primary care provider due to [facility] shutting down.  2. Type 2 diabetes mellitus with other specified complication, without long-term current use of insulin (HCC) Alexa [name]'s fasting BGs mostly range between 90-120, and 2 hour post prandial ranges between 140-150's. She is doing well with diet and she denies signs of hypoglycemia.  3. At risk for activity intolerance Alexa [name] is at risk for exercise intolerance due to decreased activity overall.  Assessment/Plan:   1. Neuropathy involving both lower extremities We will refill gabapentin for 1 month. Alexa [name] was given a primary care provider list today.  - gabapentin (NEURONTIN) 300 MG capsule; TAKE 1 CAPSULE(300 MG) BY MOUTH TWICE DAILY  Dispense: 60 capsule; Refill: 0  2. Type 2 diabetes mellitus with other specified complication, without long-term current use of insulin (HCC) We will refill metformin for 1 month. Alexa [name] will continue to follow up as directed. Good blood sugar control is important to decrease the likelihood of diabetic complications such as  nephropathy, neuropathy, limb loss, blindness, coronary artery disease, and death. Intensive lifestyle modification including diet, exercise and weight loss are the first line of treatment for diabetes.   - metFORMIN (GLUCOPHAGE) 500 MG tablet; Take 1 tablet (500 mg total) by mouth 2 (two) times daily with a meal.  Dispense: 60 tablet; Refill: 0  3. At risk for activity intolerance Alexa [name] was given approximately 15 minutes of exercise intolerance counseling today. She is 45 y.o. female and has risk factors exercise intolerance including obesity. We discussed intensive lifestyle modifications today with an emphasis on specific weight loss instructions and strategies. Alexa [name] will slowly increase activity as tolerated.  Repetitive spaced learning was employed today to elicit superior memory formation and behavioral change.  4. Obesity with current BMI of 73.9 Alexa [name] is currently in the action stage of change. As such, her goal is to continue with weight loss efforts. She has agreed to the Category 3 Plan.   Exercise goals: Start walking videos with Alexa [name].  Behavioral modification strategies: increasing lean protein intake and meal planning and cooking strategies.  Alexa [name] has agreed to follow-up with our clinic in 2 weeks. She was informed of the importance of frequent follow-up visits to maximize her success with intensive lifestyle modifications for her multiple health conditions.   Objective:   Blood pressure 139/82, pulse 92, temperature 98.4 F (36.9 C), height 5\' 4"  (1.626 m), weight (!) 430 lb (195 kg), SpO2 90 %. Body mass index is 73.81 kg/m.  General: Cooperative, alert, well developed, in no acute distress. HEENT: Conjunctivae and lids unremarkable. Cardiovascular: Regular  rhythm.  Lungs: Normal work of breathing. Neurologic: No focal deficits.   Lab Results  Component Value Date   CREATININE 0.85 06/22/2020   BUN 10 06/22/2020   NA 138 06/22/2020   K 4.9  06/22/2020   CL 97 06/22/2020   CO2 26 06/22/2020   Lab Results  Component Value Date   ALT 17 06/22/2020   AST 18 06/22/2020   ALKPHOS 57 06/22/2020   BILITOT 0.4 06/22/2020   Lab Results  Component Value Date   HGBA1C 6.0 (H) 06/22/2020   HGBA1C 5.8 (H) 03/02/2020   HGBA1C 6.1 (A) 02/19/2018   HGBA1C 6.1 07/30/2017   HGBA1C 5.7 (H) 06/10/2017   Lab Results  Component Value Date   INSULIN 20.3 06/22/2020   Lab Results  Component Value Date   TSH 1.420 06/22/2020   Lab Results  Component Value Date   CHOL 154 06/22/2020   HDL 50 06/22/2020   LDLCALC 87 06/22/2020   LDLDIRECT 100 10/08/2015   TRIG 89 06/22/2020   CHOLHDL 3.9 03/02/2020   Lab Results  Component Value Date   WBC 7.4 06/22/2020   HGB 13.6 06/22/2020   HCT 40.7 06/22/2020   MCV 93 06/22/2020   PLT 327 06/22/2020   Lab Results  Component Value Date   IRON 38 (L) 10/08/2015   TIBC 416 10/08/2015   FERRITIN 27 10/08/2015   Attestation Statements:   Reviewed by clinician on day of visit: allergies, medications, problem list, medical history, surgical history, family history, social history, and previous encounter notes.   I, Trixie Dredge, am acting as transcriptionist for Dennard Nip, MD.  I have reviewed the above documentation for accuracy and completeness, and I agree with the above. -  Dennard Nip, MD

## 2020-07-27 ENCOUNTER — Telehealth (INDEPENDENT_AMBULATORY_CARE_PROVIDER_SITE_OTHER): Payer: 59 | Admitting: Psychology

## 2020-07-27 DIAGNOSIS — F3289 Other specified depressive episodes: Secondary | ICD-10-CM | POA: Diagnosis not present

## 2020-08-02 NOTE — Progress Notes (Signed)
Office: [phone]  /  Fax: [phone]    Date: Aug 16, 2020   Appointment Start Time: 8:36am Duration: 23 minutes Provider: Glennie Isle, Psy.D. Type of Session: Individual Therapy  Location of Patient: Home Location of Provider: Provider's home (private office) Type of Contact: Telepsychological Visit via MyChart Video Visit  Session Content: This provider called Alexa [name] at 8:32am as she did not present for the telepsychological appointment. She noted challenges joining; assistance was provided. As such, today's appointment was initiated 6 minutes late. Alexa [name] is a 45 y.o. female presenting for a follow-up appointment to address the previously established treatment goal of increasing coping skills. Today's appointment was a telepsychological visit due to COVID-19. Alexa [name] provided verbal consent for today's telepsychological appointment and she is aware she is responsible for securing confidentiality on her end of the session. Prior to proceeding with today's appointment, Alexa [name]'s physical location at the time of this appointment was obtained as well a phone number she could be reached at in the event of technical difficulties. Alexa [name] and this provider participated in today's telepsychological service. Of note, today's appointment was switched to a regular telephone call at 8:38am with Alexa [name]'s verbal consent due to continued audio challenges.   This provider conducted a brief check-in. Alexa [name] stated she continues to be busy with work and extracurricular activities. Regarding eating, she discussed an improvement in eating breakfast in the morning regularly. She added she is trying to avoid getting to the point of really being hungry, especially when going to the grocery store. Alexa [name] further noted utilization of the previously shared hunger and satisfaction scale, adding it has helped with making better choices and engaging in portion control. Positive reinforcement was provided. Alexa [name] also  discussed a reduction in pasta and rice consumption. This provider and Alexa [name] discussed what has helped her make the aforementioned changes and discussed generalization of those aspects to help her continue making the lifestyle change. Moreover, psychoeducation regarding triggers for emotional eating was provided. Alexa [name] was provided a handout, and encouraged to utilize the handout between now and the next appointment to increase awareness of triggers and frequency. Alexa [name] agreed. This provider also discussed behavioral strategies for specific triggers, such as placing the utensil down when conversing to avoid mindless eating. Alexa [name] provided verbal consent during today's appointment for this provider to send a handout about triggers via e-mail. Overall, Alexa [name] was receptive to today's appointment as evidenced by openness to sharing, responsiveness to feedback, and willingness to explore triggers for emotional eating.  Mental Status Examination:  Appearance: well groomed and appropriate hygiene  Behavior: appropriate to circumstances Mood: euthymic Affect: mood congruent Speech: normal in rate, volume, and tone Eye Contact: appropriate Psychomotor Activity: unable to assess  Gait: unable to assess Thought Process: linear, logical, and goal directed  Thought Content/Perception: no hallucinations, delusions, bizarre thinking or behavior reported or observed and no evidence or endorsement of suicidal and homicidal ideation, plan, and intent Orientation: time, person, place, and purpose of appointment Memory/Concentration: memory, attention, language, and fund of knowledge intact  Insight/Judgment: fair  Interventions:  Conducted a brief chart review Provided empathic reflections and validation Employed supportive psychotherapy interventions to facilitate reduced distress and to improve coping skills with identified stressors Employed insight oriented and cognitive psychotherapy interventions to  identify and modify anxiety/mood producing thoughts, beliefs, and negative self-appraisals contributing to distress and emotional eating Psychoeducation provided regarding triggers for emotional eating  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 Alexa [name] has demonstrated progress in her goal as evidenced by increased awareness of hunger patterns. Alexa [name] also continues to demonstrate willingness to engage in learned skill(s).  Plan: The next appointment will be scheduled in three weeks, which will be via MyChart Video Visit. The next session will focus on working towards the established treatment goal.

## 2020-08-08 ENCOUNTER — Ambulatory Visit (INDEPENDENT_AMBULATORY_CARE_PROVIDER_SITE_OTHER): Payer: 59 | Admitting: Family Medicine

## 2020-08-08 ENCOUNTER — Encounter (INDEPENDENT_AMBULATORY_CARE_PROVIDER_SITE_OTHER): Payer: Self-pay | Admitting: Family Medicine

## 2020-08-08 ENCOUNTER — Other Ambulatory Visit: Payer: Self-pay

## 2020-08-08 VITALS — HR 82 | Temp 98.0°F | Ht 64.0 in | Wt >= 6400 oz

## 2020-08-08 DIAGNOSIS — R600 Localized edema: Secondary | ICD-10-CM

## 2020-08-08 DIAGNOSIS — E1169 Type 2 diabetes mellitus with other specified complication: Secondary | ICD-10-CM | POA: Diagnosis not present

## 2020-08-08 DIAGNOSIS — Z9189 Other specified personal risk factors, not elsewhere classified: Secondary | ICD-10-CM | POA: Diagnosis not present

## 2020-08-08 DIAGNOSIS — E559 Vitamin D deficiency, unspecified: Secondary | ICD-10-CM

## 2020-08-08 DIAGNOSIS — Z6841 Body Mass Index (BMI) 40.0 and over, adult: Secondary | ICD-10-CM

## 2020-08-08 MED ORDER — OZEMPIC (0.25 OR 0.5 MG/DOSE) 2 MG/1.5ML ~~LOC~~ SOPN: 0.5000 mg | PEN_INJECTOR | SUBCUTANEOUS | 0 refills | Status: DC

## 2020-08-08 MED ORDER — OZEMPIC (0.25 OR 0.5 MG/DOSE) 2 MG/1.5ML ~~LOC~~ SOPN: 0.2500 mg | PEN_INJECTOR | SUBCUTANEOUS | 0 refills | Status: DC

## 2020-08-08 MED ORDER — VITAMIN D (ERGOCALCIFEROL) 1.25 MG (50000 UNIT) PO CAPS: 50000.0000 [IU] | ORAL_CAPSULE | ORAL | 0 refills | Status: DC

## 2020-08-09 NOTE — Progress Notes (Signed)
Chief Complaint:   OBESITY Alexa [name] is here to discuss her progress with her obesity treatment plan along with follow-up of her obesity related diagnoses. Alexa [name] is on the Category 3 Plan and states she is following her eating plan approximately 80% of the time. Alexa [name] states she is doing 0 minutes 0 times per week.  Today's visit was #: 4 Starting weight: 434 lbs Starting date: 06/22/2020 Today's weight: 441 lbs Today's date: 08/08/2020 Total lbs lost to date: 0 Total lbs lost since last in-office visit: 0  Interim History: Alexa [name] has jumped up significantly from her last visit. She is still following her plan more often than not, and she feels her legs have more fluid retention than normal.  Subjective:   1. Vitamin D deficiency Alexa [name] is on Vit D prescription, and she denies signs of over-replacement.  2. Type 2 diabetes mellitus with other specified complication, without long-term current use of insulin (HCC) Alexa [name] is on metformin and she stopped her Trulicity due to planning error. She did not bring in her BGs log today, and her weight has increased recently.  3. Bilateral lower extremity edema Alexa [name] is taking Lasix 40 mg q AM and 20 mg q PM, but her prescription states to take 60 mg BID. Her primary care provider left and she has not scheduled with another. She is up 11 lbs today.  4. At high risk for fluid overload Alexa [name] is at a higher than average risk for fluid retention due to obesity. Reviewed: no chest pain on exertion, no dyspnea at rest, and no swelling of ankles.  Assessment/Plan:   1. Vitamin D deficiency Low Vitamin D level contributes to fatigue and are associated with obesity, breast, and colon cancer. We will refill prescription Vitamin D for 1 month. Alexa [name] will follow-up for routine testing of Vitamin D, at least 2-3 times per year to avoid over-replacement.  - Vitamin D, Ergocalciferol, (DRISDOL) 1.25 MG (50000 UNIT) CAPS capsule; Take 1 capsule (50,000  Units total) by mouth every 7 (seven) days.  Dispense: 4 capsule; Refill: 0  2. Type 2 diabetes mellitus with other specified complication, without long-term current use of insulin (HCC) Alexa [name] agreed to start Ozempic 0.25 mg q weekly with no refills. Good blood sugar control is important to decrease the likelihood of diabetic complications such as nephropathy, neuropathy, limb loss, blindness, coronary artery disease, and death. Intensive lifestyle modification including diet, exercise and weight loss are the first line of treatment for diabetes.   - Semaglutide,0.25 or 0.5MG /DOS, (OZEMPIC, 0.25 OR 0.5 MG/DOSE,) 2 MG/1.5ML SOPN; Inject 0.25 mg into the skin once a week.  Dispense: 1.5 mL; Refill: 0  3. Bilateral lower extremity edema Alexa [name] agreed to increased Lasix to 40 mg BID and she will continue KCI 10 mg BID. We will check labs in 2-3 weeks. She is to decrease Na+ and she is to schedule with another primary care provider as soon as possible. Handout was given today.  4. At high risk for fluid overload Alexa [name] was given approximately 15 minutes of fluid retention prevention counseling today. She is 45 y.o. female and has risk factors for fluid retention including obesity. We discussed intensive lifestyle modifications today with an emphasis on specific weight loss instructions, proper nutrition and exercise strategies.   Repetitive spaced learning was employed today to elicit superior memory formation and behavioral change.  5. Obesity with current BMI 75.70 Alexa [name] is currently in the action stage of change. As such, her goal is to  continue with weight loss efforts. She has agreed to the Category 3 Plan.   Behavioral modification strategies: decreasing sodium intake.  Alexa [name] has agreed to follow-up with our clinic in 2 to 3 weeks. She was informed of the importance of frequent follow-up visits to maximize her success with intensive lifestyle modifications for her multiple health conditions.    Objective:   Pulse 82, temperature 98 F (36.7 C), height 5\' 4"  (1.626 m), weight (!) 441 lb (200 kg), SpO2 90 %. Body mass index is 75.7 kg/m.  General: Cooperative, alert, well developed, in no acute distress. HEENT: Conjunctivae and lids unremarkable. Cardiovascular: Regular rhythm.  Lungs: Normal work of breathing. Neurologic: No focal deficits.   Lab Results  Component Value Date   CREATININE 0.85 06/22/2020   BUN 10 06/22/2020   NA 138 06/22/2020   K 4.9 06/22/2020   CL 97 06/22/2020   CO2 26 06/22/2020   Lab Results  Component Value Date   ALT 17 06/22/2020   AST 18 06/22/2020   ALKPHOS 57 06/22/2020   BILITOT 0.4 06/22/2020   Lab Results  Component Value Date   HGBA1C 6.0 (H) 06/22/2020   HGBA1C 5.8 (H) 03/02/2020   HGBA1C 6.1 (A) 02/19/2018   HGBA1C 6.1 07/30/2017   HGBA1C 5.7 (H) 06/10/2017   Lab Results  Component Value Date   INSULIN 20.3 06/22/2020   Lab Results  Component Value Date   TSH 1.420 06/22/2020   Lab Results  Component Value Date   CHOL 154 06/22/2020   HDL 50 06/22/2020   LDLCALC 87 06/22/2020   LDLDIRECT 100 10/08/2015   TRIG 89 06/22/2020   CHOLHDL 3.9 03/02/2020   Lab Results  Component Value Date   WBC 7.4 06/22/2020   HGB 13.6 06/22/2020   HCT 40.7 06/22/2020   MCV 93 06/22/2020   PLT 327 06/22/2020   Lab Results  Component Value Date   IRON 38 (L) 10/08/2015   TIBC 416 10/08/2015   FERRITIN 27 10/08/2015   Attestation Statements:   Reviewed by clinician on day of visit: allergies, medications, problem list, medical history, surgical history, family history, social history, and previous encounter notes.   I, Trixie Dredge, am acting as transcriptionist for Dennard Nip, MD.  I have reviewed the above documentation for accuracy and completeness, and I agree with the above. -  Dennard Nip, MD

## 2020-08-16 ENCOUNTER — Other Ambulatory Visit: Payer: Self-pay

## 2020-08-16 ENCOUNTER — Telehealth (INDEPENDENT_AMBULATORY_CARE_PROVIDER_SITE_OTHER): Payer: 59 | Admitting: Psychology

## 2020-08-16 DIAGNOSIS — F3289 Other specified depressive episodes: Secondary | ICD-10-CM

## 2020-08-19 ENCOUNTER — Other Ambulatory Visit: Payer: Self-pay

## 2020-08-19 ENCOUNTER — Ambulatory Visit (INDEPENDENT_AMBULATORY_CARE_PROVIDER_SITE_OTHER): Payer: 59

## 2020-08-19 DIAGNOSIS — Z7901 Long term (current) use of anticoagulants: Secondary | ICD-10-CM

## 2020-08-19 LAB — POCT INR: INR: 2.6 (ref 2.0–3.0)

## 2020-08-19 NOTE — Patient Instructions (Signed)
Continue taking 1.5 tablets daily except 1 tablet on Tuesday.   INR in 6 weeks

## 2020-08-23 NOTE — Progress Notes (Signed)
Office: [phone]  /  Fax: [phone]    Date: September 06, 2020   Appointment Start Time: 2:33pm Duration: 25 minutes Provider: Glennie Isle, Psy.D. Type of Session: Individual Therapy  Location of Patient: Home Location of Provider: Provider's home (private office) Type of Contact: Telepsychological Visit via MyChart Video Visit  Session Content: This provider called Alexa [name] at 2:32pm as she did not present for today's appointment. She indicated she was trying to join but could not recall her password. Assistance was provided. As such, today's appointment was initiated 3 minutes late. Alexa [name] is a 45 y.o. female presenting for a follow-up appointment to address the previously established treatment goal of increasing coping skills. Today's appointment was a telepsychological visit due to COVID-19. Alexa [name] provided verbal consent for today's telepsychological appointment and she is aware she is responsible for securing confidentiality on her end of the session. Prior to proceeding with today's appointment, Alexa [name]'s physical location at the time of this appointment was obtained as well a phone number she could be reached at in the event of technical difficulties. Alexa [name] and this provider participated in today's telepsychological service.   This provider conducted a brief check-in. Alexa [name] shared about recent events, including positive events. She discussed an improvement in her eating habits, adding she is thinking about "going back to being a pescatarian." Notably, Alexa [name] discussed challenges with her menstrual cycle, noting it impacts her physically and emotionally. She was encouraged to continue speaking with her gynecologist; she agreed. Alexa [name] stated she continues to have water weight retention despite changing her water pill dose; she was encouraged to MyChart message Alexa [name].   Alexa [name] reported frequent headaches, adding Alexa [name] recommended increasing water intake. She was engaged in  problem solving to increase water intake. Alexa [name] reported willingness to utilize her reusable water container and setting reminders on her phone. Alexa [name] added she continues to use reminders to continue eating regularly and discussed a reduction in emotional eating behaviors. Positive reinforcement was provided. Furthermore, termination planning was discussed. Alexa [name] was receptive to a follow-up appointment in 3-4 weeks and an additional follow-up/termination appointment in 3-4 weeks after that. Overall, Alexa [name] was receptive to today's appointment as evidenced by openness to sharing, responsiveness to feedback, and willingness to implement discussed strategies.  Mental Status Examination:  Appearance: well groomed and appropriate hygiene  Behavior: appropriate to circumstances Mood: euthymic Affect: mood congruent Speech: normal in rate, volume, and tone Eye Contact: appropriate Psychomotor Activity: unable to assess  Gait: unable to assess Thought Process: linear, logical, and goal directed  Thought Content/Perception: no hallucinations, delusions, bizarre thinking or behavior reported or observed and no evidence or endorsement of suicidal and homicidal ideation, plan, and intent Orientation: time, person, place, and purpose of appointment Memory/Concentration: memory, attention, language, and fund of knowledge intact  Insight/Judgment: good  Interventions:  Conducted a brief chart review Provided empathic reflections and validation Provided positive reinforcement Employed supportive psychotherapy interventions to facilitate reduced distress and to improve coping skills with identified stressors Engaged patient in problem solving Discussed termination planning  DSM-5 Diagnosis(es): F32.89 Other Specified Depressive Disorder, Emotional Eating Behaviors  Treatment Goal & Progress: During the initial appointment with this provider, the following treatment goal was established: increase coping  skills. Alexa [name] has demonstrated progress in her goal as evidenced by increased awareness of hunger patterns, increased awareness of triggers for emotional eating behaviors, and reduction in emotional eating behaviors . Alexa [name] also continues to demonstrate willingness to engage in learned skill(s).  Plan: The next appointment will be  scheduled in 3-4 weeks, which will be via MyChart Video Visit. The next session will focus on working towards the established treatment goal.

## 2020-08-25 ENCOUNTER — Encounter (INDEPENDENT_AMBULATORY_CARE_PROVIDER_SITE_OTHER): Payer: Self-pay | Admitting: Family Medicine

## 2020-08-25 ENCOUNTER — Other Ambulatory Visit: Payer: Self-pay

## 2020-08-25 ENCOUNTER — Ambulatory Visit (INDEPENDENT_AMBULATORY_CARE_PROVIDER_SITE_OTHER): Payer: 59 | Admitting: Family Medicine

## 2020-08-25 VITALS — BP 122/72 | HR 84 | Temp 98.2°F | Ht 64.0 in | Wt >= 6400 oz

## 2020-08-25 DIAGNOSIS — N938 Other specified abnormal uterine and vaginal bleeding: Secondary | ICD-10-CM

## 2020-08-25 DIAGNOSIS — G44211 Episodic tension-type headache, intractable: Secondary | ICD-10-CM | POA: Diagnosis not present

## 2020-08-25 DIAGNOSIS — E1169 Type 2 diabetes mellitus with other specified complication: Secondary | ICD-10-CM

## 2020-08-25 DIAGNOSIS — Z6841 Body Mass Index (BMI) 40.0 and over, adult: Secondary | ICD-10-CM

## 2020-08-25 DIAGNOSIS — Z9189 Other specified personal risk factors, not elsewhere classified: Secondary | ICD-10-CM | POA: Diagnosis not present

## 2020-08-25 MED ORDER — GLUCOSE BLOOD VI STRP: ORAL_STRIP | 0 refills | Status: DC

## 2020-08-26 ENCOUNTER — Telehealth: Payer: Self-pay | Admitting: Cardiovascular Disease

## 2020-08-26 DIAGNOSIS — Z7901 Long term (current) use of anticoagulants: Secondary | ICD-10-CM

## 2020-08-26 DIAGNOSIS — I2699 Other pulmonary embolism without acute cor pulmonale: Secondary | ICD-10-CM

## 2020-08-26 MED ORDER — WARFARIN SODIUM 7.5 MG PO TABS: ORAL_TABLET | ORAL | 0 refills | Status: DC

## 2020-08-26 NOTE — Telephone Encounter (Signed)
  *  STAT* If patient is at the pharmacy, call can be transferred to refill team.   1. Which medications need to be refilled? (please list name of each medication and dose if known) warfarin (COUMADIN) 7.5 MG tablet  2. Which pharmacy/location (including street and city if local pharmacy) is medication to be sent to? [facility] [phone] - [place],  - [address] AT [place]  3. Do they need a 30 day or 90 day supply? 90 days   Only have 3 pills left need refill

## 2020-08-31 NOTE — Progress Notes (Signed)
Chief Complaint:   OBESITY Alexa [name] is here to discuss her progress with her obesity treatment plan along with follow-up of her obesity related diagnoses. Alexa [name] is on the Category 3 Plan and states she is following her eating plan approximately 90% of the time. Alexa [name] states she is doing 0 minutes 0 times per week.  Today's visit was #: 5 Starting weight: 434 lbs Starting date: 06/22/2020 Today's weight: 428 lbs Today's date: 08/25/2020 Total lbs lost to date: 6 Total lbs lost since last in-office visit: 13  Interim History: Alexa [name] continues to work on diet and weight loss. She was retaining a lot of fluid last visit, and that appears to have improved. She is working on meal planning and prepping.  Subjective:   1. Type 2 diabetes mellitus with other specified complication, without long-term current use of insulin ([place]) Alexa [name] hasn't started Ozempic yet due to being uncertain how to take the medicine.  2. Intractable episodic tension-type headache Alexa [name] notes increased headache in the last week on and off, which is not normal for her. She has lost weight recently and her urine has been darker.  3. DUB (dysfunctional uterine bleeding) Alexa [name] notes her menses have become heavy and irregular. She hasn't seen a GYN and she doesn't have a primary care provider.  4. At risk for hyperglycemia Alexa [name] is at increased risk for hyperglycemia due to changes in diet, diagnosis of diabetes, and/or insulin use.    Assessment/Plan:   1. Type 2 diabetes mellitus with other specified complication, without long-term current use of insulin (HCC) Alexa [name] was shown how to take Ozempic on a sample pen and she agreed to start taking it today. We will refill glucose test strips #100 with no refills. Good blood sugar control is important to decrease the likelihood of diabetic complications such as nephropathy, neuropathy, limb loss, blindness, coronary artery disease, and death. Intensive lifestyle  modification including diet, exercise and weight loss are the first line of treatment for diabetes.   - glucose blood test strip; ascensia  Meter/strips preferred Test 3x daily Use as instructed Dx.dm  Dispense: 100 each; Refill: 0  2. Intractable episodic tension-type headache Alexa [name] is to increase water until her urine is light colored. We will monitor her progress.  3. DUB (dysfunctional uterine bleeding) We will refer Alexa [name] to Dr. Hale Bogus for evaluation (GYN).  - Ambulatory referral to Gynecology  4. At risk for hyperglycemia Alexa [name] was given approximately 15 minutes of counseling today regarding prevention of hyperglycemia. She was advised of hyperglycemia causes and the fact hyperglycemia is often asymptomatic. Alexa [name] was instructed to avoid skipping meals, eat regular protein rich meals and schedule low calorie but protein rich snacks as needed.   Repetitive spaced learning was employed today to elicit superior memory formation and behavioral change  5. Obesity with current BMI 73.43 Alexa [name] is currently in the action stage of change. As such, her goal is to continue with weight loss efforts. She has agreed to the Category 3 Plan.   Behavioral modification strategies: increasing lean protein intake and meal planning and cooking strategies.  Alexa [name] has agreed to follow-up with our clinic in 3 weeks. She was informed of the importance of frequent follow-up visits to maximize her success with intensive lifestyle modifications for her multiple health conditions.   Objective:   Blood pressure 122/72, pulse 84, temperature 98.2 F (36.8 C), height 5\' 4"  (1.626 m), weight (!) 428 lb (194.1 kg), SpO2 91 %. Body mass index is 73.47  kg/m.  General: Cooperative, alert, well developed, in no acute distress. HEENT: Conjunctivae and lids unremarkable. Cardiovascular: Regular rhythm.  Lungs: Normal work of breathing. Neurologic: No focal deficits.   Lab Results  Component Value Date    CREATININE 0.85 06/22/2020   BUN 10 06/22/2020   NA 138 06/22/2020   K 4.9 06/22/2020   CL 97 06/22/2020   CO2 26 06/22/2020   Lab Results  Component Value Date   ALT 17 06/22/2020   AST 18 06/22/2020   ALKPHOS 57 06/22/2020   BILITOT 0.4 06/22/2020   Lab Results  Component Value Date   HGBA1C 6.0 (H) 06/22/2020   HGBA1C 5.8 (H) 03/02/2020   HGBA1C 6.1 (A) 02/19/2018   HGBA1C 6.1 07/30/2017   HGBA1C 5.7 (H) 06/10/2017   Lab Results  Component Value Date   INSULIN 20.3 06/22/2020   Lab Results  Component Value Date   TSH 1.420 06/22/2020   Lab Results  Component Value Date   CHOL 154 06/22/2020   HDL 50 06/22/2020   LDLCALC 87 06/22/2020   LDLDIRECT 100 10/08/2015   TRIG 89 06/22/2020   CHOLHDL 3.9 03/02/2020   Lab Results  Component Value Date   WBC 7.4 06/22/2020   HGB 13.6 06/22/2020   HCT 40.7 06/22/2020   MCV 93 06/22/2020   PLT 327 06/22/2020   Lab Results  Component Value Date   IRON 38 (L) 10/08/2015   TIBC 416 10/08/2015   FERRITIN 27 10/08/2015   Attestation Statements:   Reviewed by clinician on day of visit: allergies, medications, problem list, medical history, surgical history, family history, social history, and previous encounter notes.   I, Trixie Dredge, am acting as transcriptionist for Dennard Nip, MD.  I have reviewed the above documentation for accuracy and completeness, and I agree with the above. -  Dennard Nip, MD

## 2020-09-06 ENCOUNTER — Telehealth (INDEPENDENT_AMBULATORY_CARE_PROVIDER_SITE_OTHER): Payer: 59 | Admitting: Psychology

## 2020-09-06 ENCOUNTER — Other Ambulatory Visit: Payer: Self-pay

## 2020-09-06 ENCOUNTER — Encounter (HOSPITAL_BASED_OUTPATIENT_CLINIC_OR_DEPARTMENT_OTHER): Payer: 59 | Admitting: Pulmonary Disease

## 2020-09-06 ENCOUNTER — Other Ambulatory Visit (INDEPENDENT_AMBULATORY_CARE_PROVIDER_SITE_OTHER): Payer: Self-pay | Admitting: Family Medicine

## 2020-09-06 DIAGNOSIS — F3289 Other specified depressive episodes: Secondary | ICD-10-CM | POA: Diagnosis not present

## 2020-09-06 DIAGNOSIS — E559 Vitamin D deficiency, unspecified: Secondary | ICD-10-CM

## 2020-09-06 NOTE — Telephone Encounter (Signed)
Patient is requesting a refill of the following medications: Requested Prescriptions   Pending Prescriptions Disp Refills   Vitamin D, Ergocalciferol, (DRISDOL) 1.25 MG (50000 UNIT) CAPS capsule [Pharmacy Med Name: VITAMIN D2 50,000IU (ERGO) CAP RX] 4 capsule 0    Sig: TAKE 1 CAPSULE BY MOUTH EVERY 7 DAYS     Last office visit: 08/25/20 Date of last refill: 08/08/20 Last refill amount: 4 Follow up time period per chart: 3 week Next appt: 09/12/20

## 2020-09-12 ENCOUNTER — Ambulatory Visit (INDEPENDENT_AMBULATORY_CARE_PROVIDER_SITE_OTHER): Payer: 59 | Admitting: Family Medicine

## 2020-09-14 ENCOUNTER — Other Ambulatory Visit: Payer: Self-pay

## 2020-09-14 ENCOUNTER — Ambulatory Visit (INDEPENDENT_AMBULATORY_CARE_PROVIDER_SITE_OTHER): Payer: 59 | Admitting: Family Medicine

## 2020-09-14 ENCOUNTER — Encounter (INDEPENDENT_AMBULATORY_CARE_PROVIDER_SITE_OTHER): Payer: Self-pay | Admitting: Family Medicine

## 2020-09-14 VITALS — BP 108/70 | HR 89 | Temp 99.2°F | Ht 64.0 in | Wt >= 6400 oz

## 2020-09-14 DIAGNOSIS — G5793 Unspecified mononeuropathy of bilateral lower limbs: Secondary | ICD-10-CM | POA: Diagnosis not present

## 2020-09-14 DIAGNOSIS — Z9189 Other specified personal risk factors, not elsewhere classified: Secondary | ICD-10-CM | POA: Diagnosis not present

## 2020-09-14 DIAGNOSIS — E1169 Type 2 diabetes mellitus with other specified complication: Secondary | ICD-10-CM | POA: Diagnosis not present

## 2020-09-14 DIAGNOSIS — Z6841 Body Mass Index (BMI) 40.0 and over, adult: Secondary | ICD-10-CM

## 2020-09-14 MED ORDER — GABAPENTIN 300 MG PO CAPS: ORAL_CAPSULE | ORAL | 0 refills | Status: DC

## 2020-09-14 MED ORDER — METFORMIN HCL 500 MG PO TABS: 500.0000 mg | ORAL_TABLET | Freq: Two times a day (BID) | ORAL | 0 refills | Status: DC

## 2020-09-17 ENCOUNTER — Other Ambulatory Visit: Payer: Self-pay | Admitting: Cardiovascular Disease

## 2020-09-17 DIAGNOSIS — I2699 Other pulmonary embolism without acute cor pulmonale: Secondary | ICD-10-CM

## 2020-09-17 DIAGNOSIS — Z7901 Long term (current) use of anticoagulants: Secondary | ICD-10-CM

## 2020-09-19 NOTE — Progress Notes (Signed)
Chief Complaint:   OBESITY Alexa [name] is here to discuss her progress with her obesity treatment plan along with follow-up of her obesity related diagnoses. Alexa [name] is on the Category 3 Plan and states she is following her eating plan approximately 85% of the time. Alexa [name] states she is not currently exercising.  Today's visit was #: 6 Starting weight: 434 lbs Starting date: 06/22/2020 Today's weight: 432 Today's date: 09/14/2020 Total lbs lost to date: 2 Total lbs lost since last in-office visit: 0  Interim History: Alexa [name] is really enjoying her experience at the clinic. Food wise, she is trying to eat on a regular basis. She is trying to get in meals and limit snacks. Pt feels she needs to prep more. She realizes she needs to plan meals in advance with food she knows is in the house. [place] this [place].  Subjective:   1. Type 2 diabetes mellitus with other specified complication, without long-term current use of insulin (HCC) Retina is not sure what amount of meds she has left. She denies GI upset. She just started Ozempic 5 days a go.  2. Neuropathy involving both lower extremities Kayia is on Gabapentin with good control of symptoms.  3. At risk for activity intolerance Alexa [name] is at risk for exercise intolerance due to neuropathy.   Assessment/Plan:   1. Type 2 diabetes mellitus with other specified complication, without long-term current use of insulin (HCC) Good blood sugar control is important to decrease the likelihood of diabetic complications such as nephropathy, neuropathy, limb loss, blindness, coronary artery disease, and death. Intensive lifestyle modification including diet, exercise and weight loss are the first line of treatment for diabetes.   - metFORMIN (GLUCOPHAGE) 500 MG tablet; Take 1 tablet (500 mg total) by mouth 2 (two) times daily with a meal.  Dispense: 60 tablet; Refill: 0  2. Neuropathy involving both lower extremities Refill  Gabapentin 300 mg, as prescribed below.  - gabapentin (NEURONTIN) 300 MG capsule; TAKE 1 CAPSULE(300 MG) BY MOUTH TWICE DAILY  Dispense: 60 capsule; Refill: 0  3. At risk for activity intolerance Alexa [name] was given approximately 15 minutes of exercise intolerance counseling today. She is 45 y.o. female and has risk factors exercise intolerance including obesity. We discussed intensive lifestyle modifications today with an emphasis on specific weight loss instructions and strategies. Alexa [name] will slowly increase activity as tolerated.  Repetitive spaced learning was employed today to elicit superior memory formation and behavioral change.   4. Obesity with current BMI 73.43  Alexa [name] is currently in the action stage of change. As such, her goal is to continue with weight loss efforts. She has agreed to the Category 3 Plan.   Exercise goals: No exercise has been prescribed at this time.  Behavioral modification strategies: increasing lean protein intake, meal planning and cooking strategies, keeping healthy foods in the home, and planning for success.  Alexa [name] has agreed to follow-up with our clinic in 3 weeks. She was informed of the importance of frequent follow-up visits to maximize her success with intensive lifestyle modifications for her multiple health conditions.   Objective:   Blood pressure 108/70, pulse 89, temperature 99.2 F (37.3 C), height 5\' 4"  (1.626 m), weight (!) 432 lb (196 kg), SpO2 96 %. Body mass index is 74.15 kg/m.  General: Cooperative, alert, well developed, in no acute distress. HEENT: Conjunctivae and lids unremarkable. Cardiovascular: Regular rhythm.  Lungs: Normal work of breathing. Neurologic: No focal deficits.   Lab  Results  Component Value Date   CREATININE 0.85 06/22/2020   BUN 10 06/22/2020   NA 138 06/22/2020   K 4.9 06/22/2020   CL 97 06/22/2020   CO2 26 06/22/2020   Lab Results  Component Value Date   ALT 17 06/22/2020   AST 18 06/22/2020    ALKPHOS 57 06/22/2020   BILITOT 0.4 06/22/2020   Lab Results  Component Value Date   HGBA1C 6.0 (H) 06/22/2020   HGBA1C 5.8 (H) 03/02/2020   HGBA1C 6.1 (A) 02/19/2018   HGBA1C 6.1 07/30/2017   HGBA1C 5.7 (H) 06/10/2017   Lab Results  Component Value Date   INSULIN 20.3 06/22/2020   Lab Results  Component Value Date   TSH 1.420 06/22/2020   Lab Results  Component Value Date   CHOL 154 06/22/2020   HDL 50 06/22/2020   LDLCALC 87 06/22/2020   LDLDIRECT 100 10/08/2015   TRIG 89 06/22/2020   CHOLHDL 3.9 03/02/2020   Lab Results  Component Value Date   WBC 7.4 06/22/2020   HGB 13.6 06/22/2020   HCT 40.7 06/22/2020   MCV 93 06/22/2020   PLT 327 06/22/2020   Lab Results  Component Value Date   IRON 38 (L) 10/08/2015   TIBC 416 10/08/2015   FERRITIN 27 10/08/2015   Attestation Statements:   Reviewed by clinician on day of visit: allergies, medications, problem list, medical history, surgical history, family history, social history, and previous encounter notes.  Coral Ceo, CMA, am acting as transcriptionist for Coralie Common, MD.   I have reviewed the above documentation for accuracy and completeness, and I agree with the above. - Jinny Blossom, MD

## 2020-09-20 NOTE — Progress Notes (Signed)
  Office: [phone]  /  Fax: [phone]    Date: October 04, 2020   Appointment Start Time: 2:02pm Duration: 20 minutes Provider: Glennie Isle, Psy.D. Type of Session: Individual Therapy  Location of Patient: Home Location of Provider: Provider's home (private office) Type of Contact: Telepsychological Visit via MyChart Video Visit  Session Content: Alexa [name] is a 44 y.o. female presenting for a follow-up appointment to address the previously established treatment goal of increasing coping skills. Today's appointment was a telepsychological visit due to COVID-19. Alexa [name] provided verbal consent for today's telepsychological appointment and she is aware she is responsible for securing confidentiality on her end of the session. Prior to proceeding with today's appointment, Alexa [name]'s physical location at the time of this appointment was obtained as well a phone number she could be reached at in the event of technical difficulties. Alexa [name] and this provider participated in today's telepsychological service. Of note, today's appointment was switched to a regular telephone call at 2:03pm with Alexa [name]'s verbal consent due to technical issues.   This provider conducted a brief check-in. Alexa [name] shared about her recent show, adding, "It was amazing." Regarding eating, Alexa [name] stated she is "down 12 more pounds." She discussed an overall improvement in eating habits. Positive reinforcement was provided. Notably, she discussed an increase in water intake, adding there has been a reduction in headaches. Alexa [name] of session focused on discussing what has helped her thus far with making the lifestyle change and also discussing an additional exercise to add to her toolbox to help with cravings/emotional eating behaviors. Alexa [name] stated focusing on physical activity and developing a routine has helped. Discussed urge surfing. Alexa [name] provided verbal consent during today's appointment for this provider to send the handout  for the urge surfing exercise via e-mail. Overall, Alexa [name] was receptive to today's appointment as evidenced by openness to sharing, responsiveness to feedback, and willingness to implement discussed strategies .  Mental Status Examination:  Appearance: well groomed and appropriate hygiene  Behavior: appropriate to circumstances Mood: euthymic Affect: mood congruent Speech: normal in rate, volume, and tone Eye Contact: appropriate Psychomotor Activity: unable to assess Gait: unable to assess Thought Process: linear, logical, and goal directed  Thought Content/Perception: no hallucinations, delusions, bizarre thinking or behavior reported or observed and no evidence or endorsement of suicidal and homicidal ideation, plan, and intent Orientation: time, person, place, and purpose of appointment Memory/Concentration: memory, attention, language, and fund of knowledge intact  Insight/Judgment: good  Interventions:  Conducted a brief chart review Provided empathic reflections and validation Reviewed content from the previous session Provided positive reinforcement Employed supportive psychotherapy interventions to facilitate reduced distress, and to improve coping skills with identified stressors  DSM-5 Diagnosis(es): F32.89 Other Specified Depressive Disorder, Emotional Eating Behaviors  Treatment Goal & Progress: During the initial appointment with this provider, the following treatment goal was established: increase coping skills. Alexa [name] has demonstrated progress in her goal as evidenced by increased awareness of hunger patterns, increased awareness of triggers for emotional eating behaviors, and reduction in emotional eating behaviors . Alexa [name] also continues to demonstrate willingness to engage in learned skill(s).  Plan: The next appointment will be scheduled in one month, which will be via MyChart Video Visit. The next session will focus on working towards the established treatment goal  and termination.

## 2020-09-29 ENCOUNTER — Other Ambulatory Visit: Payer: Self-pay

## 2020-09-29 ENCOUNTER — Ambulatory Visit (INDEPENDENT_AMBULATORY_CARE_PROVIDER_SITE_OTHER): Payer: 59

## 2020-09-29 DIAGNOSIS — Z7901 Long term (current) use of anticoagulants: Secondary | ICD-10-CM | POA: Diagnosis not present

## 2020-09-29 LAB — POCT INR: INR: 1.9 — AB (ref 2.0–3.0)

## 2020-09-29 NOTE — Patient Instructions (Signed)
Take 2 tablets today only and then Continue taking 1.5 tablets daily except 1 tablet on Tuesday.   INR in 6 weeks

## 2020-10-03 ENCOUNTER — Ambulatory Visit (INDEPENDENT_AMBULATORY_CARE_PROVIDER_SITE_OTHER): Payer: 59 | Admitting: Family Medicine

## 2020-10-03 ENCOUNTER — Other Ambulatory Visit: Payer: Self-pay

## 2020-10-03 ENCOUNTER — Encounter (INDEPENDENT_AMBULATORY_CARE_PROVIDER_SITE_OTHER): Payer: Self-pay | Admitting: Family Medicine

## 2020-10-03 VITALS — BP 140/88 | HR 84 | Temp 98.0°F | Ht 64.0 in | Wt >= 6400 oz

## 2020-10-03 DIAGNOSIS — Z9189 Other specified personal risk factors, not elsewhere classified: Secondary | ICD-10-CM

## 2020-10-03 DIAGNOSIS — E559 Vitamin D deficiency, unspecified: Secondary | ICD-10-CM

## 2020-10-03 DIAGNOSIS — E1169 Type 2 diabetes mellitus with other specified complication: Secondary | ICD-10-CM | POA: Diagnosis not present

## 2020-10-03 DIAGNOSIS — Z6841 Body Mass Index (BMI) 40.0 and over, adult: Secondary | ICD-10-CM | POA: Diagnosis not present

## 2020-10-03 MED ORDER — VITAMIN D (ERGOCALCIFEROL) 1.25 MG (50000 UNIT) PO CAPS: 50000.0000 [IU] | ORAL_CAPSULE | ORAL | 0 refills | Status: DC

## 2020-10-03 MED ORDER — OZEMPIC (0.25 OR 0.5 MG/DOSE) 2 MG/1.5ML ~~LOC~~ SOPN
0.2500 mg | PEN_INJECTOR | SUBCUTANEOUS | 0 refills | Status: DC
Start: 2020-10-03 — End: 2020-11-08

## 2020-10-04 ENCOUNTER — Telehealth (INDEPENDENT_AMBULATORY_CARE_PROVIDER_SITE_OTHER): Payer: 59 | Admitting: Psychology

## 2020-10-04 DIAGNOSIS — F3289 Other specified depressive episodes: Secondary | ICD-10-CM | POA: Diagnosis not present

## 2020-10-10 NOTE — Progress Notes (Signed)
Chief Complaint:   OBESITY Alexa [name] is here to discuss her progress with her obesity treatment plan along with follow-up of her obesity related diagnoses. Denim is on the Category 3 Plan and states she is following her eating plan approximately 80% of the time. Alexa [name] states she is being more active for 15 minutes.   Today's visit was #: 7 Starting weight: 434 lbs Starting date: 06/22/2020 Today's weight: 420 lbs Today's date: 10/03/2020 Total lbs lost to date: 14 Total lbs lost since last in-office visit: 12  Interim History: Alexa [name] has increased her activity recently and she has increased her water intake. Her hunger is reasonably controlled and she has not been skipping meals. She is trying to increase protein and portion control.  Subjective:   1. Type 2 diabetes mellitus with other specified complication, without long-term current use of insulin (HCC) Leonila continues to do well with weight loss. She denies nausea or vomiting, and some decreased polyphagia was noted.  2. Vitamin D deficiency Ardis is stable on Vit D prescription, and she denies nausea, vomiting, or muscle weakness.  3. At risk for dehydration Alexa [name] is at risk for dehydration due to inadequate water intake.  Assessment/Plan:   1. Type 2 diabetes mellitus with other specified complication, without long-term current use of insulin ([place]) Malita agreed to increase Ozempic to 0.5 mg q weekly and we will refill for 1 month. Good blood sugar control is important to decrease the likelihood of diabetic complications such as nephropathy, neuropathy, limb loss, blindness, coronary artery disease, and death. Intensive lifestyle modification including diet, exercise and weight loss are the first line of treatment for diabetes.   - Semaglutide,0.25 or 0.5MG /DOS, (OZEMPIC, 0.25 OR 0.5 MG/DOSE,) 2 MG/1.5ML SOPN; Inject 0.25 mg into the skin once a week.  Dispense: 1.5 mL; Refill: 0  2. Vitamin D deficiency Low Vitamin D  level contributes to fatigue and are associated with obesity, breast, and colon cancer. We will refill prescription Vitamin D for 1 month. Alexa [name] will follow-up for routine testing of Vitamin D, at least 2-3 times per year to avoid over-replacement.  - Vitamin D, Ergocalciferol, (DRISDOL) 1.25 MG (50000 UNIT) CAPS capsule; Take 1 capsule (50,000 Units total) by mouth every 7 (seven) days.  Dispense: 4 capsule; Refill: 0  3. At risk for dehydration [name] was given approximately 15 minutes dehydration prevention counseling today. [name] is at risk for dehydration due to weight loss and current medication(s). She was encouraged to hydrate and monitor fluid status to avoid dehydration as well as weight loss plateaus.   4. Obesity with current BMI 72.06 Alexa [name] is currently in the action stage of change. As such, her goal is to continue with weight loss efforts. She has agreed to the Category 3 Plan or practicing portion control and making smarter food choices, such as increasing vegetables and decreasing simple carbohydrates.   Exercise goals: As is, add water exercise at the [facility].  Behavioral modification strategies: increasing lean protein intake.  Alexa [name] has agreed to follow-up with our clinic in 2 to 3 weeks. She was informed of the importance of frequent follow-up visits to maximize her success with intensive lifestyle modifications for her multiple health conditions.   Objective:   Blood pressure 140/88, pulse 84, temperature 98 F (36.7 C), height 5\' 4"  (1.626 m), weight (!) 420 lb (190.5 kg), SpO2 96 %. Body mass index is 72.09 kg/m.  General: Cooperative, alert, well developed, in no acute distress. HEENT: Conjunctivae and lids unremarkable.  Cardiovascular: Regular rhythm.  Lungs: Normal work of breathing. Neurologic: No focal deficits.   Lab Results  Component Value Date   CREATININE 0.85 06/22/2020   BUN 10 06/22/2020   NA 138 06/22/2020   K 4.9 06/22/2020   CL 97 06/22/2020    CO2 26 06/22/2020   Lab Results  Component Value Date   ALT 17 06/22/2020   AST 18 06/22/2020   ALKPHOS 57 06/22/2020   BILITOT 0.4 06/22/2020   Lab Results  Component Value Date   HGBA1C 6.0 (H) 06/22/2020   HGBA1C 5.8 (H) 03/02/2020   HGBA1C 6.1 (A) 02/19/2018   HGBA1C 6.1 07/30/2017   HGBA1C 5.7 (H) 06/10/2017   Lab Results  Component Value Date   INSULIN 20.3 06/22/2020   Lab Results  Component Value Date   TSH 1.420 06/22/2020   Lab Results  Component Value Date   CHOL 154 06/22/2020   HDL 50 06/22/2020   LDLCALC 87 06/22/2020   LDLDIRECT 100 10/08/2015   TRIG 89 06/22/2020   CHOLHDL 3.9 03/02/2020   Lab Results  Component Value Date   VD25OH 15.8 (L) 06/22/2020   VD25OH 6.6 06/13/2016   VD25OH 33 10/08/2015   Lab Results  Component Value Date   WBC 7.4 06/22/2020   HGB 13.6 06/22/2020   HCT 40.7 06/22/2020   MCV 93 06/22/2020   PLT 327 06/22/2020   Lab Results  Component Value Date   IRON 38 (L) 10/08/2015   TIBC 416 10/08/2015   FERRITIN 27 10/08/2015   Attestation Statements:   Reviewed by clinician on day of visit: allergies, medications, problem list, medical history, surgical history, family history, social history, and previous encounter notes.   I, Trixie Dredge, am acting as transcriptionist for Alexa Nip, MD.  I have reviewed the above documentation for accuracy and completeness, and I agree with the above. -  Alexa Nip, MD

## 2020-10-11 ENCOUNTER — Telehealth: Payer: Self-pay | Admitting: Cardiovascular Disease

## 2020-10-11 NOTE — Telephone Encounter (Signed)
Dr. Johny Sax calling with a question about the patient's disability claim and her pulmonary hypertension. She states her air was 87%, then was given oxygen and it went up to 93%. She would like a call back from an APP to discuss the plan for the patient's chronic respirator failure and whether the patient will be having a cath or repeated echo. Phone: [phone]

## 2020-10-11 NOTE — Telephone Encounter (Signed)
Returned call to Dr. Johny Sax who was calling on behalf of patients [place] claim. Advised Dr. Johny Sax that Pulmonary was dealing with patients Disability. Advised that no DPR on file with [facility] to disclose information. Per Dr. Johny Sax patient is being seen by pulmonary and she will contact them for further guidance.

## 2020-10-15 ENCOUNTER — Other Ambulatory Visit (INDEPENDENT_AMBULATORY_CARE_PROVIDER_SITE_OTHER): Payer: Self-pay | Admitting: Family Medicine

## 2020-10-15 DIAGNOSIS — G5793 Unspecified mononeuropathy of bilateral lower limbs: Secondary | ICD-10-CM

## 2020-10-17 ENCOUNTER — Other Ambulatory Visit: Payer: Self-pay

## 2020-10-17 ENCOUNTER — Other Ambulatory Visit (INDEPENDENT_AMBULATORY_CARE_PROVIDER_SITE_OTHER): Payer: Self-pay | Admitting: Emergency Medicine

## 2020-10-17 ENCOUNTER — Ambulatory Visit (INDEPENDENT_AMBULATORY_CARE_PROVIDER_SITE_OTHER): Payer: 59 | Admitting: Podiatry

## 2020-10-17 DIAGNOSIS — L84 Corns and callosities: Secondary | ICD-10-CM

## 2020-10-17 DIAGNOSIS — G5793 Unspecified mononeuropathy of bilateral lower limbs: Secondary | ICD-10-CM

## 2020-10-17 DIAGNOSIS — M79674 Pain in right toe(s): Secondary | ICD-10-CM | POA: Diagnosis not present

## 2020-10-17 DIAGNOSIS — B351 Tinea unguium: Secondary | ICD-10-CM

## 2020-10-17 DIAGNOSIS — E11628 Type 2 diabetes mellitus with other skin complications: Secondary | ICD-10-CM | POA: Diagnosis not present

## 2020-10-17 DIAGNOSIS — M79675 Pain in left toe(s): Secondary | ICD-10-CM

## 2020-10-17 NOTE — Telephone Encounter (Signed)
Last OV with Dr. Beasley 

## 2020-10-17 NOTE — Telephone Encounter (Signed)
LAST APPOINTMENT DATE: 10/03/20 NEXT APPOINTMENT DATE: 10/24/20   [facility] [id number] - [address], [address] [place] [address] Phone: [phone] Fax: [phone]  [facility] [phone] - [place] [address], [address] AT [place] [address] Phone: [phone] Fax: [phone]  Patient is requesting a refill of the following medications: Requested Prescriptions   Pending Prescriptions Disp Refills   gabapentin (NEURONTIN) 300 MG capsule [Pharmacy Med Name: GABAPENTIN '300MG'$  CAPSULES] 60 capsule 0    Sig: TAKE 1 CAPSULE(300 MG) BY MOUTH TWICE DAILY    Date last filled: 09/14/20 Previously prescribed by   Lab Results  Component Value Date   HGBA1C 6.0 (H) 06/22/2020   HGBA1C 5.8 (H) 03/02/2020   HGBA1C 6.1 (A) 02/19/2018   Lab Results  Component Value Date   MICROALBUR 1.4 01/28/2016   LDLCALC 87 06/22/2020   CREATININE 0.85 06/22/2020   Lab Results  Component Value Date   VD25OH 15.8 (L) 06/22/2020   VD25OH 6.6 06/13/2016   VD25OH 33 10/08/2015    BP Readings from Last 3 Encounters:  10/03/20 140/88  09/14/20 108/70  08/25/20 122/72

## 2020-10-17 NOTE — Telephone Encounter (Signed)
Sorry patient have enough till next appt

## 2020-10-18 NOTE — Progress Notes (Signed)
Office: [phone]  /  Fax: [phone]    Date: November 01, 2020   Appointment Start Time: 2:04pm Duration: 18 minutes Provider: Glennie Isle, Psy.D. Type of Session: Individual Therapy  Location of Patient: Parked in car outside park in [place] (safe/private location) Location of Provider: Provider's home (private office) Type of Contact: Telepsychological Visit via [place] Video Visit  Session Content: This provider called Alexa [name] at 2:04pm as she did not present for today's appointment. She indicated she was trying to connect to today's appointment, noting challenges. As such, today's appointment was initiated 4 minutes late and Soundra provided verbal consent to proceed via a regular telephone call. Alexa [name] is a 45 y.o. female presenting for a follow-up appointment to address the previously established treatment goal of increasing coping skills. Today's appointment was a telepsychological visit due to COVID-19. [name] provided verbal consent for today's telepsychological appointment and she is aware she is responsible for securing confidentiality on her end of the session. Prior to proceeding with today's appointment, [name]'s physical location at the time of this appointment was obtained as well a phone number she could be reached at in the event of technical difficulties. Joselle and this provider participated in today's telepsychological service.   This provider conducted a brief check-in. Brailyn shared about recent events, including losing 10 more pounds. She shared she is continuing to stay "consistent with [her] eating." She also continues to report a reduction in emotional eating behaviors. Positive reinforcement was provided. A plan was developed to help Alexa [name] cope with emotional eating in the future using learned skills. She wrote down the following plan: focus on hydration; be prepared with snacks congruent to the meal plan; pause to ask questions when triggered to eat (e.g., Am I  really hungry?, Is there something bothering me?, and Will I feel better if I eat?); and engage in discussed coping strategies (e.g., prioritizing tasks, engaging in pleasurable activities, journaling) after going through the aforementioned questions. Alexa [name] was receptive to today's appointment as evidenced by openness to sharing, responsiveness to feedback, and willingness to continue engaging in learned skills.  Mental Status Examination:  Appearance: unable to assess  Behavior: unable to assess Mood: euthymic Affect: unable to fully assess Speech: normal in rate, volume, and tone Eye Contact: unable to assess Psychomotor Activity: unable to assess Gait: unable to assess Thought Process: linear, logical, and goal directed  Thought Content/Perception: no hallucinations, delusions, bizarre thinking or behavior reported or observed and no evidence or endorsement of suicidal and homicidal ideation, plan, and intent Orientation: time, person, place, and purpose of appointment Memory/Concentration: memory, attention, language, and fund of knowledge intact  Insight/Judgment: good  Interventions:  Conducted a brief chart review Provided empathic reflections and validation Processed thoughts and feelings Reviewed learned skills Employed supportive psychotherapy interventions to facilitate reduced distress, and to improve coping skills with identified stressors Provided positive reinforcement   DSM-5 Diagnosis(es): F32.89 Other Specified Depressive Disorder, Emotional Eating Behaviors  Treatment Goal & Progress: During the initial appointment with this provider, the following treatment goal was established: increase coping skills. Alexa [name] demonstrated progress in her goal as evidenced by increased awareness of hunger patterns, increased awareness of triggers for emotional eating behaviors, and reduction in emotional eating behaviors . Alexa [name] also continues to demonstrate willingness to engage in  learned skill(s).  Plan: As previously planned, today was Alexa [name]'s last appointment with this provider. She acknowledged understanding that she may request a follow-up appointment with this provider in the future as long as she is still  established with the clinic. No further follow-up planned by this provider.

## 2020-10-19 ENCOUNTER — Ambulatory Visit (INDEPENDENT_AMBULATORY_CARE_PROVIDER_SITE_OTHER): Payer: 59 | Admitting: Family Medicine

## 2020-10-19 ENCOUNTER — Encounter (INDEPENDENT_AMBULATORY_CARE_PROVIDER_SITE_OTHER): Payer: Self-pay | Admitting: Family Medicine

## 2020-10-19 ENCOUNTER — Other Ambulatory Visit: Payer: Self-pay

## 2020-10-19 VITALS — BP 109/69 | HR 100 | Temp 98.2°F | Ht 64.0 in | Wt >= 6400 oz

## 2020-10-19 DIAGNOSIS — E876 Hypokalemia: Secondary | ICD-10-CM

## 2020-10-19 DIAGNOSIS — E559 Vitamin D deficiency, unspecified: Secondary | ICD-10-CM

## 2020-10-19 DIAGNOSIS — Z9189 Other specified personal risk factors, not elsewhere classified: Secondary | ICD-10-CM | POA: Diagnosis not present

## 2020-10-19 DIAGNOSIS — E1169 Type 2 diabetes mellitus with other specified complication: Secondary | ICD-10-CM | POA: Diagnosis not present

## 2020-10-19 DIAGNOSIS — Z6841 Body Mass Index (BMI) 40.0 and over, adult: Secondary | ICD-10-CM

## 2020-10-19 MED ORDER — VITAMIN D (ERGOCALCIFEROL) 1.25 MG (50000 UNIT) PO CAPS: 50000.0000 [IU] | ORAL_CAPSULE | ORAL | 0 refills | Status: DC

## 2020-10-19 MED ORDER — METFORMIN HCL 500 MG PO TABS: 500.0000 mg | ORAL_TABLET | Freq: Two times a day (BID) | ORAL | 0 refills | Status: DC

## 2020-10-19 MED ORDER — POTASSIUM CHLORIDE CRYS ER 10 MEQ PO TBCR: EXTENDED_RELEASE_TABLET | ORAL | 2 refills | Status: DC

## 2020-10-20 NOTE — Progress Notes (Signed)
  Subjective:  Patient ID: Alexa [name], female    DOB: [birth year],  MRN: [id number]  Alexa [name] presents to clinic today for preventative diabetic foot care and painful thick toenails that are difficult to trim. Pain interferes with ambulation. Aggravating factors include wearing enclosed shoe gear. Pain is relieved with periodic professional debridement.  She notes no new pedal problems on today's visit. Last A1c was 6.0%.  PCP is Alexa Mai, MD.  Allergies  Allergen Reactions   Penicillins Hives    Has patient had a PCN reaction causing immediate rash, facial/tongue/throat swelling, SOB or lightheadedness with hypotension: No Has patient had a PCN reaction causing severe rash involving mucus membranes or skin necrosis: No Has patient had a PCN reaction that required hospitalization No Has patient had a PCN reaction occurring within the last 10 years: No If all of the above answers are "NO", then may proceed with Cephalosporin use.    Review of Systems: Negative except as noted in the HPI. Objective:   Constitutional Alexa [name] is a pleasant 45 y.o. African American female, morbidly obese in NAD. AAO x 3.   Vascular Capillary refill time to digits immediate b/l. Palpable pedal pulses b/l LE. Pedal hair sparse. Lower extremity skin temperature gradient within normal limits. No pain with calf compression b/l. +1 pitting edema b/l lower extremities. No cyanosis or clubbing noted.  Neurologic Normal speech. Oriented to person, place, and time. Protective sensation intact 5/5 intact bilaterally with 10g monofilament b/l.  Dermatologic No open wounds b/l lower extremities. No interdigital macerations b/l lower extremities. Toenails 1-5 b/l elongated, discolored, dystrophic, thickened, crumbly with subungual debris and tenderness to dorsal palpation. Hyperkeratotic lesion(s) plantar aspect b/l heel pads.  No erythema, no edema, no drainage, no fluctuance.  Orthopedic: Normal  muscle strength 5/5 to all lower extremity muscle groups bilaterally. No pain crepitus or joint limitation noted with ROM b/l. No gross bony deformities bilaterally.   Radiographs: None Assessment:   1. Pain due to onychomycosis of toenails of both feet   2. Callus of foot   3. Type 2 diabetes mellitus with other skin complication, without long-term current use of insulin (Alexa [name])    Plan:  -Examined patient. -Continue diabetic foot care principles. -Patient to continue soft, supportive shoe gear daily. -Toenails 1-5 b/l were debrided in length and girth with sterile nail nippers and dremel without iatrogenic bleeding.  -Callus(es) plantar aspect b/l heel pads pared utilizing sterile scalpel blade without complication or incident. Total number debrided =2. -Patient to report any pedal injuries to medical professional immediately. -Patient/POA to call should there be question/concern in the interim.  Return in about 3 months (around 01/17/2021).  Marzetta Board, DPM

## 2020-10-21 ENCOUNTER — Encounter (INDEPENDENT_AMBULATORY_CARE_PROVIDER_SITE_OTHER): Payer: Self-pay | Admitting: Family Medicine

## 2020-10-24 ENCOUNTER — Ambulatory Visit (INDEPENDENT_AMBULATORY_CARE_PROVIDER_SITE_OTHER): Payer: 59 | Admitting: Family Medicine

## 2020-10-25 NOTE — Progress Notes (Signed)
Chief Complaint:   OBESITY Alexa [name] is here to discuss her progress with her obesity treatment plan along with follow-up of her obesity related diagnoses. Alexa [name] is on the Category 3 Plan or practicing portion control and making smarter food choices, such as increasing vegetables and decreasing simple carbohydrates and states she is following her eating plan approximately 80% of the time. Alexa [name] states she is dancing and walking for 15 minutes 3-4 times per week.  Today's visit was #: 8 Starting weight: 434 lbs Starting date: 06/22/2020 Today's weight: 410 lbs Today's date: 10/19/2020 Total lbs lost to date: 24 Total lbs lost since last in-office visit: 10  Interim History: Alexa [name] is trying to do better with meal planning and making smarter choices. She is working on portion control especially for sweets. She is trying to increase her protein.  Subjective:   1. Hypokalemia Alexa [name] is stable on KCI, and she needs a refill today.  2. Type 2 diabetes mellitus with other specified complication, without long-term current use of insulin (HCC) Alexa [name] is stable on metformin, and she is doing well with diet and weight loss.  3. Vitamin D deficiency Alexa [name] is stable on Vit D, but her level is not yet at goal.  4. At risk for dehydration Alexa [name] is at risk for dehydration due to inadequate water intake.  Assessment/Plan:   1. Hypokalemia We will refill KCI for 1 month, and we will recheck labs in 3 months.  - potassium chloride (KLOR-CON) 10 MEQ tablet; TAKE 1 TABLET(10 MEQ) BY MOUTH TWICE DAILY  Dispense: 60 tablet; Refill: 2  2. Type 2 diabetes mellitus with other specified complication, without long-term current use of insulin (HCC) We will refill metformin, and we will recheck labs in 3 months. Good blood sugar control is important to decrease the likelihood of diabetic complications such as nephropathy, neuropathy, limb loss, blindness, coronary artery disease, and death. Intensive  lifestyle modification including diet, exercise and weight loss are the first line of treatment for diabetes.   - metFORMIN (GLUCOPHAGE) 500 MG tablet; Take 1 tablet (500 mg total) by mouth 2 (two) times daily with a meal.  Dispense: 60 tablet; Refill: 0  3. Vitamin D deficiency Low Vitamin D level contributes to fatigue and are associated with obesity, breast, and colon cancer. We will refill prescription Vitamin D for 1 month. We will recheck labs in 3 months. Alexa [name] will follow-up for routine testing of Vitamin D, at least 2-3 times per year to avoid over-replacement.  - Vitamin D, Ergocalciferol, (DRISDOL) 1.25 MG (50000 UNIT) CAPS capsule; Take 1 capsule (50,000 Units total) by mouth every 7 (seven) days.  Dispense: 4 capsule; Refill: 0  4. At risk for dehydration Alexa [name] was given approximately 15 minutes dehydration prevention counseling today. Alexa [name] is at risk for dehydration due to weight loss and current medication(s). She was encouraged to hydrate and monitor fluid status to avoid dehydration as well as weight loss plateaus.   5. Obesity with current BMI 70.4 Alexa [name] is currently in the action stage of change. As such, her goal is to continue with weight loss efforts. She has agreed to practicing portion control and making smarter food choices, such as increasing vegetables and decreasing simple carbohydrates.   Exercise goals: As is.  Behavioral modification strategies: increasing lean protein intake, decreasing simple carbohydrates, and increasing water intake.  Alexa [name] has agreed to follow-up with our clinic in 3 weeks. She was informed of the importance of frequent follow-up visits to maximize her  success with intensive lifestyle modifications for her multiple health conditions.   Objective:   Blood pressure 109/69, pulse 100, temperature 98.2 F (36.8 C), height '5\' 4"'$  (1.626 m), weight (!) 410 lb (186 kg), SpO2 96 %. Body mass index is 70.38 kg/m.  General: Cooperative,  alert, well developed, in no acute distress. HEENT: Conjunctivae and lids unremarkable. Cardiovascular: Regular rhythm.  Lungs: Normal work of breathing. Neurologic: No focal deficits.   Lab Results  Component Value Date   CREATININE 0.85 06/22/2020   BUN 10 06/22/2020   NA 138 06/22/2020   K 4.9 06/22/2020   CL 97 06/22/2020   CO2 26 06/22/2020   Lab Results  Component Value Date   ALT 17 06/22/2020   AST 18 06/22/2020   ALKPHOS 57 06/22/2020   BILITOT 0.4 06/22/2020   Lab Results  Component Value Date   HGBA1C 6.0 (H) 06/22/2020   HGBA1C 5.8 (H) 03/02/2020   HGBA1C 6.1 (A) 02/19/2018   HGBA1C 6.1 07/30/2017   HGBA1C 5.7 (H) 06/10/2017   Lab Results  Component Value Date   INSULIN 20.3 06/22/2020   Lab Results  Component Value Date   TSH 1.420 06/22/2020   Lab Results  Component Value Date   CHOL 154 06/22/2020   HDL 50 06/22/2020   LDLCALC 87 06/22/2020   LDLDIRECT 100 10/08/2015   TRIG 89 06/22/2020   CHOLHDL 3.9 03/02/2020   Lab Results  Component Value Date   VD25OH 15.8 (L) 06/22/2020   VD25OH 6.6 06/13/2016   VD25OH 33 10/08/2015   Lab Results  Component Value Date   WBC 7.4 06/22/2020   HGB 13.6 06/22/2020   HCT 40.7 06/22/2020   MCV 93 06/22/2020   PLT 327 06/22/2020   Lab Results  Component Value Date   IRON 38 (L) 10/08/2015   TIBC 416 10/08/2015   FERRITIN 27 10/08/2015   Attestation Statements:   Reviewed by clinician on day of visit: allergies, medications, problem list, medical history, surgical history, family history, social history, and previous encounter notes.   I, Trixie Dredge, am acting as transcriptionist for Dennard Nip, MD.  I have reviewed the above documentation for accuracy and completeness, and I agree with the above. -  Dennard Nip, MD

## 2020-10-31 ENCOUNTER — Ambulatory Visit (INDEPENDENT_AMBULATORY_CARE_PROVIDER_SITE_OTHER): Payer: 59 | Admitting: Family Medicine

## 2020-10-31 ENCOUNTER — Encounter: Payer: Self-pay | Admitting: Family Medicine

## 2020-10-31 ENCOUNTER — Other Ambulatory Visit: Payer: Self-pay

## 2020-10-31 VITALS — BP 123/81 | HR 81 | Resp 16 | Ht 64.0 in | Wt >= 6400 oz

## 2020-10-31 DIAGNOSIS — F411 Generalized anxiety disorder: Secondary | ICD-10-CM

## 2020-10-31 DIAGNOSIS — Z6841 Body Mass Index (BMI) 40.0 and over, adult: Secondary | ICD-10-CM

## 2020-10-31 DIAGNOSIS — E1169 Type 2 diabetes mellitus with other specified complication: Secondary | ICD-10-CM

## 2020-10-31 LAB — GLUCOSE, POCT (MANUAL RESULT ENTRY): POC Glucose: 106 mg/dl — AB (ref 70–99)

## 2020-10-31 MED ORDER — PAROXETINE HCL 10 MG PO TABS: 10.0000 mg | ORAL_TABLET | Freq: Every day | ORAL | 0 refills | Status: DC

## 2020-10-31 NOTE — Progress Notes (Signed)
New Patient Office Visit  Subjective:  Patient ID: Alexa [name], female    DOB: 15-Dec-1975  Age: 45 y.o. MRN: [id number]  CC:  Chief Complaint  Patient presents with   New Patient (Initial Visit)   Diabetes    HPI Alexa [name] presents for follow-up of chronic medical issues including diabetes and obesity.  Patient's A1c is at acceptable level at this time.  Patient is currently working with an outpatient weight loss for weight loss management.  Patient also reports increased social stressors.  Past Medical History:  Diagnosis Date   Anemia    Back pain    Bilateral swelling of feet    CHF (congestive heart failure) (HCC)    Diabetes mellitus without complication ([place])    [employer] 03/02/2020   DM2 (diabetes mellitus, type 2) (HCC)    Hypertension    Joint pain    Nocturnal hypoxia    Obstructive sleep apnea syndrome, moderate    Pulmonary embolism ([place] [facility]) 05/14/2016   submassive, on chronic anticoagulation   Pulmonary hypertension ([place])    Vitamin D deficiency     Past Surgical History:  Procedure Laterality Date   CESAREAN SECTION      Family History  Problem Relation Age of Onset   Diabetes Maternal Grandmother    Breast cancer Maternal Grandmother        breast cancer    Brain cancer Maternal Grandmother    Hypertension Mother    Obesity Mother    Heart disease Father    Obesity Father    Breast cancer Maternal Aunt 55   Breast cancer Maternal Aunt 75    Social History   Socioeconomic History   Marital status: Divorced    Spouse name: n/a   Number of children: 2   Years of education: Not on file   Highest education level: Not on file  Occupational History   Occupation: [employer]  Tobacco Use   Smoking status: Former    Types: Cigarettes   Smokeless tobacco: Never   Tobacco comments:    only smokes when she drinks alcohol - 1 pack per week  Vaping Use   Vaping Use: Never used  Substance and Sexual Activity   Alcohol use: Yes     Alcohol/week: 4.0 - 6.0 standard drinks    Types: 4 - 6 Standard drinks or equivalent per week    Comment: mixed drinks 3-4 times/weeks   Drug use: No   Sexual activity: Not Currently  Other Topics Concern   Not on file  Social History Narrative   Her children live with her.   Ex-husband lives locally.   Social Determinants of Health   Financial Resource Strain: Not on file  Food Insecurity: Not on file  Transportation Needs: Not on file  Physical Activity: Not on file  Stress: Not on file  Social Connections: Not on file  Intimate Partner Violence: Not on file    ROS Review of Systems  Psychiatric/Behavioral:  Positive for sleep disturbance. Negative for self-injury and suicidal ideas. The patient is nervous/anxious.   All other systems reviewed and are negative.  Objective:   Today's Vitals: BP 123/81   Pulse 81   Resp 16   Ht _0  (1.626 m)   Wt (!) 417 lb (189.1 kg)   SpO2 (!) 89%   BMI 71.58 kg/m   Physical Exam Vitals and nursing note reviewed.  Constitutional:      General: She is not in acute distress.  Appearance: She is obese.  Cardiovascular:     Rate and Rhythm: Normal rate and regular rhythm.  Pulmonary:     Effort: Pulmonary effort is normal.     Breath sounds: Normal breath sounds.  Abdominal:     Palpations: Abdomen is soft.     Tenderness: There is no abdominal tenderness.  Neurological:     General: No focal deficit present.     Mental Status: She is alert and oriented to person, place, and time.  Psychiatric:        Mood and Affect: Mood normal.        Behavior: Behavior normal.        Thought Content: Thought content normal.    Assessment & Plan:   Problem List Items Addressed This Visit   None Visit Diagnoses     Type 2 diabetes mellitus with other specified complication, without long-term current use of insulin ([place])    -  Primary   discussed compliance -    Relevant Orders   POCT glucose (manual entry) (Completed)    Microalbumin / creatinine urine ratio   Obesity with current BMI 70.4       discussed dietary and activity options - continue with consultant - goal is 4-6lbs/mo weight loss   Generalized anxiety disorder       patient finally agreed to begin paxil 10 mg daily and will monitor   Relevant Medications   PARoxetine (PAXIL) 10 MG tablet       Outpatient Encounter Medications as of 10/31/2020  Medication Sig   PARoxetine (PAXIL) 10 MG tablet Take 1 tablet (10 mg total) by mouth daily.   acetaminophen (TYLENOL) 500 MG tablet Take 1,000 mg by mouth daily as needed for mild pain.   blood glucose meter kit and supplies KIT Use up to four times daily as directed.   cetirizine (ZYRTEC) 10 MG tablet Take 1 tablet (10 mg total) by mouth daily.   furosemide (LASIX) 40 MG tablet Take 40 mg by mouth 2 (two) times daily.   gabapentin (NEURONTIN) 300 MG capsule TAKE 1 CAPSULE(300 MG) BY MOUTH TWICE DAILY   glucose blood test strip ascensia  Meter/strips preferred Test 3x daily Use as instructed Dx.dm   Lancets (ONETOUCH ULTRASOFT) lancets Test daily Use as instructed dm2   metFORMIN (GLUCOPHAGE) 500 MG tablet Take 1 tablet (500 mg total) by mouth 2 (two) times daily with a meal.   Misc. Devices MISC Bariatric size shower chair with handle 423 lbs, BMI 72  Dx. Pulmonary HTN, DM2 with neuropathy, morbid obesity   potassium chloride (KLOR-CON) 10 MEQ tablet TAKE 1 TABLET(10 MEQ) BY MOUTH TWICE DAILY   Semaglutide,0.25 or 0.5MG/DOS, (OZEMPIC, 0.25 OR 0.5 MG/DOSE,) 2 MG/1.5ML SOPN Inject 0.25 mg into the skin once a week.   Vitamin D, Ergocalciferol, (DRISDOL) 1.25 MG (50000 UNIT) CAPS capsule Take 1 capsule (50,000 Units total) by mouth every 7 (seven) days.   warfarin (COUMADIN) 7.5 MG tablet TAKE 1 TO 1 AND 1/2 TABLETS BY MOUTH DAILY AS DIRECTED BY COUMADIN CLINIC   No facility-administered encounter medications on file as of 10/31/2020.    Follow-up: Return in about 4 weeks (around 11/28/2020) for follow  up.   Becky Sax, MD

## 2020-11-01 ENCOUNTER — Telehealth (INDEPENDENT_AMBULATORY_CARE_PROVIDER_SITE_OTHER): Payer: 59 | Admitting: Psychology

## 2020-11-01 DIAGNOSIS — F3289 Other specified depressive episodes: Secondary | ICD-10-CM | POA: Diagnosis not present

## 2020-11-01 LAB — MICROALBUMIN / CREATININE URINE RATIO
Creatinine, Urine: 170.8 mg/dL
Microalb/Creat Ratio: 8 mg/g creat (ref 0–29)
Microalbumin, Urine: 14.3 ug/mL

## 2020-11-08 ENCOUNTER — Other Ambulatory Visit: Payer: Self-pay

## 2020-11-08 ENCOUNTER — Encounter (INDEPENDENT_AMBULATORY_CARE_PROVIDER_SITE_OTHER): Payer: Self-pay | Admitting: Family Medicine

## 2020-11-08 ENCOUNTER — Ambulatory Visit (INDEPENDENT_AMBULATORY_CARE_PROVIDER_SITE_OTHER): Payer: 59 | Admitting: Family Medicine

## 2020-11-08 VITALS — BP 140/80 | HR 78 | Temp 98.1°F | Ht 64.0 in | Wt >= 6400 oz

## 2020-11-08 DIAGNOSIS — E559 Vitamin D deficiency, unspecified: Secondary | ICD-10-CM

## 2020-11-08 DIAGNOSIS — Z6841 Body Mass Index (BMI) 40.0 and over, adult: Secondary | ICD-10-CM

## 2020-11-08 DIAGNOSIS — M545 Low back pain, unspecified: Secondary | ICD-10-CM | POA: Diagnosis not present

## 2020-11-08 DIAGNOSIS — E66813 Obesity, class 3: Secondary | ICD-10-CM

## 2020-11-08 DIAGNOSIS — Z9189 Other specified personal risk factors, not elsewhere classified: Secondary | ICD-10-CM

## 2020-11-08 DIAGNOSIS — E538 Deficiency of other specified B group vitamins: Secondary | ICD-10-CM | POA: Diagnosis not present

## 2020-11-08 DIAGNOSIS — E1169 Type 2 diabetes mellitus with other specified complication: Secondary | ICD-10-CM

## 2020-11-08 MED ORDER — OZEMPIC (0.25 OR 0.5 MG/DOSE) 2 MG/1.5ML ~~LOC~~ SOPN: 0.2500 mg | PEN_INJECTOR | SUBCUTANEOUS | 0 refills | Status: DC

## 2020-11-08 MED ORDER — METFORMIN HCL 500 MG PO TABS: 500.0000 mg | ORAL_TABLET | Freq: Two times a day (BID) | ORAL | 0 refills | Status: DC

## 2020-11-08 MED ORDER — VITAMIN D (ERGOCALCIFEROL) 1.25 MG (50000 UNIT) PO CAPS: 50000.0000 [IU] | ORAL_CAPSULE | ORAL | 0 refills | Status: DC

## 2020-11-09 LAB — INSULIN, RANDOM: INSULIN: 3.9 u[IU]/mL (ref 2.6–24.9)

## 2020-11-09 LAB — HEMOGLOBIN A1C
Est. average glucose Bld gHb Est-mCnc: 131 mg/dL
Hgb A1c MFr Bld: 6.2 % — ABNORMAL HIGH (ref 4.8–5.6)

## 2020-11-09 LAB — CMP14+EGFR
ALT: 9 IU/L (ref 0–32)
AST: 19 IU/L (ref 0–40)
Albumin/Globulin Ratio: 1.3 (ref 1.2–2.2)
Albumin: 4.1 g/dL (ref 3.8–4.8)
Alkaline Phosphatase: 53 IU/L (ref 44–121)
BUN/Creatinine Ratio: 12 (ref 9–23)
BUN: 8 mg/dL (ref 6–24)
Bilirubin Total: 0.5 mg/dL (ref 0.0–1.2)
CO2: 25 mmol/L (ref 20–29)
Calcium: 9.1 mg/dL (ref 8.7–10.2)
Chloride: 97 mmol/L (ref 96–106)
Creatinine, Ser: 0.69 mg/dL (ref 0.57–1.00)
Globulin, Total: 3.1 g/dL (ref 1.5–4.5)
Glucose: 82 mg/dL (ref 65–99)
Potassium: 5.1 mmol/L (ref 3.5–5.2)
Sodium: 138 mmol/L (ref 134–144)
Total Protein: 7.2 g/dL (ref 6.0–8.5)
eGFR: 111 mL/min/{1.73_m2} (ref >59)

## 2020-11-09 LAB — VITAMIN D 25 HYDROXY (VIT D DEFICIENCY, FRACTURES): Vit D, 25-Hydroxy: 28.1 ng/mL — ABNORMAL LOW (ref 30.0–100.0)

## 2020-11-09 LAB — VITAMIN B12: Vitamin B-12: 169 pg/mL — ABNORMAL LOW (ref 232–1245)

## 2020-11-09 NOTE — Progress Notes (Signed)
Chief Complaint:   OBESITY Alexa [name] is here to discuss her progress with her obesity treatment plan along with follow-up of her obesity related diagnoses. Alexa [name] is on practicing portion control and making smarter food choices, such as increasing vegetables and decreasing simple carbohydrates and states she is following her eating plan approximately 90% of the time. Alexa [name] states she is doing 0 minutes 0 times per week.  Today's visit was #: 9 Starting weight: 434 lbs Starting date: 06/22/2020 Today's weight: 408 lbs Today's date: 11/08/2020 Total lbs lost to date: 26 Total lbs lost since last in-office visit: 2  Interim History: Alexa [name] continues to do well with weight loss. Her mother has been staying with her temporarily and there has been some extra stress. She has done well dealing with this however and she feels well overall.  Subjective:   1. Intermittent low back pain Alexa [name] appears worse around her cycle with lower back pain, more of a deep ache. She notes stretching makes it feel better.  2. Type 2 diabetes mellitus with other specified complication, without long-term current use of insulin (HCC) Alexa [name] is stable on metformin and she had increased Ozempic to 0.5 mg with no GI issues.  3. Vitamin D deficiency Alexa [name] is stable on Vit D, and she denies signs of over-replacement.  4. Vitamin B12 deficiency Alexa [name] has a history of B12 deficiency. She is on a B12 rich diet and she is due for labs.  5. At risk for impaired metabolic function Alexa [name] is at increased risk for impaired metabolic function due to current nutrition and muscle mass.  Assessment/Plan:   1. Intermittent low back pain We will refer Aqueelah to physical therapy for stretching/stretching exercises.  - Ambulatory referral to Physical Therapy  2. Type 2 diabetes mellitus with other specified complication, without long-term current use of insulin (HCC) We will check labs today. We will refill metformin  and Ozempic for 1 month. Good blood sugar control is important to decrease the likelihood of diabetic complications such as nephropathy, neuropathy, limb loss, blindness, coronary artery disease, and death. Intensive lifestyle modification including diet, exercise and weight loss are the first line of treatment for diabetes.   - metFORMIN (GLUCOPHAGE) 500 MG tablet; Take 1 tablet (500 mg total) by mouth 2 (two) times daily with a meal.  Dispense: 60 tablet; Refill: 0 - CMP14+EGFR - Insulin, random - Hemoglobin A1c - Semaglutide,0.25 or 0.5MG/DOS, (OZEMPIC, 0.25 OR 0.5 MG/DOSE,) 2 MG/1.5ML SOPN; Inject 0.25 mg into the skin once a week.  Dispense: 1.5 mL; Refill: 0  3. Vitamin D deficiency Low Vitamin D level contributes to fatigue and are associated with obesity, breast, and colon cancer. We will check labs today, and we will refill prescription Vitamin D for 1 month. Alexa [name] will follow-up for routine testing of Vitamin D, at least 2-3 times per year to avoid over-replacement.  - Vitamin D, Ergocalciferol, (DRISDOL) 1.25 MG (50000 UNIT) CAPS capsule; Take 1 capsule (50,000 Units total) by mouth every 7 (seven) days.  Dispense: 4 capsule; Refill: 0 - VITAMIN D 25 Hydroxy (Vit-D Deficiency, Fractures)  4. Vitamin B12 deficiency The diagnosis was reviewed with the patient. We will check labs today. Alexa [name] will continue to follow up as directed. Orders and follow up as documented in patient record.  - Vitamin B12  5. At risk for impaired metabolic function Alexa [name] was given approximately 30 minutes of impaired  metabolic function prevention counseling today. We discussed intensive lifestyle modifications today with an emphasis  on specific nutrition and exercise instructions and strategies.   Repetitive spaced learning was employed today to elicit superior memory formation and behavioral change.  6. Obesity with current BMI 70 Alexa [name] is currently in the action stage of change. As such, her goal is  to continue with weight loss efforts. She has agreed to practicing portion control and making smarter food choices, such as increasing vegetables and decreasing simple carbohydrates.   Behavioral modification strategies: increasing lean protein intake and meal planning and cooking strategies.  Alexa [name] has agreed to follow-up with our clinic in 2 to 3 weeks. She was informed of the importance of frequent follow-up visits to maximize her success with intensive lifestyle modifications for her multiple health conditions.   Alexa [name] was informed we would discuss her lab results at her next visit unless there is a critical issue that needs to be addressed sooner. Alexa [name] agreed to keep her next visit at the agreed upon time to discuss these results.  Objective:   Blood pressure 140/80, pulse 78, temperature 98.1 F (36.7 C), height _0  (1.626 m), weight (!) 408 lb (185.1 kg), SpO2 95 %. Body mass index is 70.03 kg/m.  General: Cooperative, alert, well developed, in no acute distress. HEENT: Conjunctivae and lids unremarkable. Cardiovascular: Regular rhythm.  Lungs: Normal work of breathing. Neurologic: No focal deficits.   Lab Results  Component Value Date   CREATININE 0.69 11/08/2020   BUN 8 11/08/2020   NA 138 11/08/2020   K 5.1 11/08/2020   CL 97 11/08/2020   CO2 25 11/08/2020   Lab Results  Component Value Date   ALT 9 11/08/2020   AST 19 11/08/2020   ALKPHOS 53 11/08/2020   BILITOT 0.5 11/08/2020   Lab Results  Component Value Date   HGBA1C 6.2 (H) 11/08/2020   HGBA1C 6.0 (H) 06/22/2020   HGBA1C 5.8 (H) 03/02/2020   HGBA1C 6.1 (A) 02/19/2018   HGBA1C 6.1 07/30/2017   Lab Results  Component Value Date   INSULIN 3.9 11/08/2020   INSULIN 20.3 06/22/2020   Lab Results  Component Value Date   TSH 1.420 06/22/2020   Lab Results  Component Value Date   CHOL 154 06/22/2020   HDL 50 06/22/2020   LDLCALC 87 06/22/2020   LDLDIRECT 100 10/08/2015   TRIG 89 06/22/2020    CHOLHDL 3.9 03/02/2020   Lab Results  Component Value Date   VD25OH WILL FOLLOW 11/08/2020   VD25OH 15.8 (L) 06/22/2020   VD25OH 6.6 06/13/2016   Lab Results  Component Value Date   WBC 7.4 06/22/2020   HGB 13.6 06/22/2020   HCT 40.7 06/22/2020   MCV 93 06/22/2020   PLT 327 06/22/2020   Lab Results  Component Value Date   IRON 38 (L) 10/08/2015   TIBC 416 10/08/2015   FERRITIN 27 10/08/2015   Attestation Statements:   Reviewed by clinician on day of visit: allergies, medications, problem list, medical history, surgical history, family history, social history, and previous encounter notes.   I, Trixie Dredge, am acting as transcriptionist for Dennard Nip, MD.  I have reviewed the above documentation for accuracy and completeness, and I agree with the above. -  Dennard Nip, MD

## 2020-11-11 ENCOUNTER — Other Ambulatory Visit: Payer: Self-pay

## 2020-11-11 ENCOUNTER — Ambulatory Visit (INDEPENDENT_AMBULATORY_CARE_PROVIDER_SITE_OTHER): Payer: 59

## 2020-11-11 DIAGNOSIS — Z7901 Long term (current) use of anticoagulants: Secondary | ICD-10-CM

## 2020-11-11 LAB — POCT INR: INR: 2.3 (ref 2.0–3.0)

## 2020-11-11 NOTE — Patient Instructions (Signed)
Continue taking 1.5 tablets daily except 1 tablet on Tuesday.   INR in 6 weeks

## 2020-11-14 NOTE — Telephone Encounter (Signed)
We can refill the ozempic but the lasix is filled by her PCP

## 2020-11-17 ENCOUNTER — Telehealth (INDEPENDENT_AMBULATORY_CARE_PROVIDER_SITE_OTHER): Payer: Self-pay

## 2020-11-17 NOTE — Telephone Encounter (Signed)
Pt called in  and stated that she needs a refill Ozempic, sent to [facility] on [address]. The pt spoke to [place] and she let her know that she will have to reach out to her PCP for a refill Lasix. Please advise

## 2020-11-21 NOTE — Telephone Encounter (Signed)
DR Leafy Ro patient need a refill on hr Ozempic. I told her it was last filled on 11/08/20. I called the pharmacy and they stated it was too soon to refill cause patient is taking 0.25 but when spoke with patient she stated you increase her to 0.5 and she is out.  Patient also stated she need a refill on her lasix. She states you used to refill this med for her. And it is the [facility] on [address]

## 2020-11-21 NOTE — Telephone Encounter (Signed)
Last OV with Dr. Beasley 

## 2020-11-22 NOTE — Telephone Encounter (Signed)
Please tell her we can refill this 1x for emergencies, but she needs her PCP to fill this from now on.

## 2020-11-23 ENCOUNTER — Telehealth: Payer: Self-pay | Admitting: Family Medicine

## 2020-11-23 NOTE — Telephone Encounter (Signed)
furosemide (LASIX) 40 MG tablet [id number]    Order Details Dose: 40 mg Route: Oral Frequency: 2 times daily  Dispense Quantity: -- Refills: --        Sig: Take 40 mg by mouth 2 (two) times daily.       Start Date: -- End Date: --  Written Date: -- Expiration Date: --  Ordering Date: 08/08/20    Pt called about needing this med refilled pt was last seen 10-31-20

## 2020-11-24 NOTE — Telephone Encounter (Signed)
Please review

## 2020-11-29 ENCOUNTER — Other Ambulatory Visit: Payer: Self-pay

## 2020-11-29 ENCOUNTER — Encounter (INDEPENDENT_AMBULATORY_CARE_PROVIDER_SITE_OTHER): Payer: Self-pay | Admitting: Family Medicine

## 2020-11-29 ENCOUNTER — Ambulatory Visit: Payer: 59 | Admitting: Family Medicine

## 2020-11-29 ENCOUNTER — Ambulatory Visit (INDEPENDENT_AMBULATORY_CARE_PROVIDER_SITE_OTHER): Payer: 59 | Admitting: Family Medicine

## 2020-11-29 VITALS — BP 140/81 | HR 99 | Temp 98.4°F | Ht 64.0 in | Wt >= 6400 oz

## 2020-11-29 DIAGNOSIS — E559 Vitamin D deficiency, unspecified: Secondary | ICD-10-CM

## 2020-11-29 DIAGNOSIS — Z6841 Body Mass Index (BMI) 40.0 and over, adult: Secondary | ICD-10-CM

## 2020-11-29 DIAGNOSIS — Z9189 Other specified personal risk factors, not elsewhere classified: Secondary | ICD-10-CM | POA: Diagnosis not present

## 2020-11-29 DIAGNOSIS — E1169 Type 2 diabetes mellitus with other specified complication: Secondary | ICD-10-CM

## 2020-11-29 DIAGNOSIS — R600 Localized edema: Secondary | ICD-10-CM

## 2020-11-29 MED ORDER — POTASSIUM CHLORIDE CRYS ER 10 MEQ PO TBCR: EXTENDED_RELEASE_TABLET | ORAL | 2 refills | Status: DC

## 2020-11-29 MED ORDER — FUROSEMIDE 40 MG PO TABS: 40.0000 mg | ORAL_TABLET | Freq: Two times a day (BID) | ORAL | 0 refills | Status: DC

## 2020-11-29 MED ORDER — METFORMIN HCL 500 MG PO TABS: 500.0000 mg | ORAL_TABLET | Freq: Two times a day (BID) | ORAL | 0 refills | Status: DC

## 2020-11-29 MED ORDER — VITAMIN D (ERGOCALCIFEROL) 1.25 MG (50000 UNIT) PO CAPS: 50000.0000 [IU] | ORAL_CAPSULE | ORAL | 0 refills | Status: DC

## 2020-11-29 MED ORDER — OZEMPIC (0.25 OR 0.5 MG/DOSE) 2 MG/1.5ML ~~LOC~~ SOPN: 0.5000 mg | PEN_INJECTOR | SUBCUTANEOUS | 0 refills | Status: DC

## 2020-11-29 NOTE — Progress Notes (Signed)
Chief Complaint:   OBESITY Alexa [name] is here to discuss her progress with her obesity treatment plan along with follow-up of her obesity related diagnoses. Alexa [name] is on practicing portion control and making smarter food choices, such as increasing vegetables and decreasing simple carbohydrates and states she is following her eating plan approximately 80% of the time. Alexa [name] states she is doing 0 minutes 0 times per week.  Today's visit was #: 10 Starting weight: 434 lbs Starting date: 06/22/2020 Today's weight: 400 lbs Today's date: 11/29/2020 Total lbs lost to date: 34 Total lbs lost since last in-office visit: 8  Interim History: Kc continues to do well with weight loss on her Category 3 plan. Her hunger is controlled. She is sometimes eating less food than on her plan. She will be starting physical therapy soon.  Subjective:   1. Type 2 diabetes mellitus with other specified complication, without long-term current use of insulin (HCC) Alexa [name]'s A1c is stable at 6.2, which is well controlled but increased from 6.0, despite her weight loss. She is on Ozempic at 0.5 mg  2. Vitamin D deficiency Alexa [name] is stable on it D, and her level is slowly improving but is no yet at goal.  3. Bilateral lower extremity edema Alexa [name] is stable on Lasix, and she asked for a refill from her primary care physician but she has not yet received an answer.   4. At risk for heart disease Alexa [name] is at a higher than average risk for cardiovascular disease due to obesity.   Assessment/Plan:   1. Type 2 diabetes mellitus with other specified complication, without long-term current use of insulin (HCC) We will refill metformin for 1 month. Vester agreed to increase Ozempic to 0.5 mg q weekly. Good blood sugar control is important to decrease the likelihood of diabetic complications such as nephropathy, neuropathy, limb loss, blindness, coronary artery disease, and death. Intensive lifestyle modification  including diet, exercise and weight loss are the first line of treatment for diabetes.   - metFORMIN (GLUCOPHAGE) 500 MG tablet; Take 1 tablet (500 mg total) by mouth 2 (two) times daily with a meal.  Dispense: 60 tablet; Refill: 0 - Semaglutide,0.25 or 0.'5MG'$ /DOS, (OZEMPIC, 0.25 OR 0.5 MG/DOSE,) 2 MG/1.5ML SOPN; Inject 0.5 mg into the skin once a week.  Dispense: 1.5 mL; Refill: 0  2. Vitamin D deficiency Low Vitamin D level contributes to fatigue and are associated with obesity, breast, and colon cancer. We will refill prescription Vitamin D for 1 month. Tahisha will follow-up for routine testing of Vitamin D, at least 2-3 times per year to avoid over-replacement.  - Vitamin D, Ergocalciferol, (DRISDOL) 1.25 MG (50000 UNIT) CAPS capsule; Take 1 capsule (50,000 Units total) by mouth every 7 (seven) days.  Dispense: 4 capsule; Refill: 0  3. Bilateral lower extremity edema We will refill Lasix for 1 month (emergency refill, patient it to have her primary care physician manage this), and we will refill KCI for 90 days with no refills.  - potassium chloride (KLOR-CON) 10 MEQ tablet; TAKE 1 TABLET(10 MEQ) BY MOUTH TWICE DAILY  Dispense: 60 tablet; Refill: 2 - furosemide (LASIX) 40 MG tablet; Take 1 tablet (40 mg total) by mouth 2 (two) times daily.  Dispense: 30 tablet; Refill: 0  4. At risk for heart disease Alexa [name] was given approximately 15 minutes of coronary artery disease prevention counseling today. She is 45 y.o. female and has risk factors for heart disease including obesity. We discussed intensive lifestyle modifications today with  an emphasis on specific weight loss instructions and strategies.   Repetitive spaced learning was employed today to elicit superior memory formation and behavioral change.  5. Obesity with current BMI 68.7 Alexa [name] is currently in the action stage of change. As such, her goal is to continue with weight loss efforts. She has agreed to the Category 3 Plan.    Exercise goals: Per physical therapy.  Behavioral modification strategies: no skipping meals.  Casady has agreed to follow-up with our clinic in 2 to 3 weeks. She was informed of the importance of frequent follow-up visits to maximize her success with intensive lifestyle modifications for her multiple health conditions.   Objective:   Blood pressure 140/81, pulse 99, temperature 98.4 F (36.9 C), height '5\' 4"'$  (1.626 m), weight (!) 400 lb (181.4 kg), SpO2 96 %. Body mass index is 68.66 kg/m.  General: Cooperative, alert, well developed, in no acute distress. HEENT: Conjunctivae and lids unremarkable. Cardiovascular: Regular rhythm.  Lungs: Normal work of breathing. Neurologic: No focal deficits.   Lab Results  Component Value Date   CREATININE 0.69 11/08/2020   BUN 8 11/08/2020   NA 138 11/08/2020   K 5.1 11/08/2020   CL 97 11/08/2020   CO2 25 11/08/2020   Lab Results  Component Value Date   ALT 9 11/08/2020   AST 19 11/08/2020   ALKPHOS 53 11/08/2020   BILITOT 0.5 11/08/2020   Lab Results  Component Value Date   HGBA1C 6.2 (H) 11/08/2020   HGBA1C 6.0 (H) 06/22/2020   HGBA1C 5.8 (H) 03/02/2020   HGBA1C 6.1 (A) 02/19/2018   HGBA1C 6.1 07/30/2017   Lab Results  Component Value Date   INSULIN 3.9 11/08/2020   INSULIN 20.3 06/22/2020   Lab Results  Component Value Date   TSH 1.420 06/22/2020   Lab Results  Component Value Date   CHOL 154 06/22/2020   HDL 50 06/22/2020   LDLCALC 87 06/22/2020   LDLDIRECT 100 10/08/2015   TRIG 89 06/22/2020   CHOLHDL 3.9 03/02/2020   Lab Results  Component Value Date   VD25OH 28.1 (L) 11/08/2020   VD25OH 15.8 (L) 06/22/2020   VD25OH 6.6 06/13/2016   Lab Results  Component Value Date   WBC 7.4 06/22/2020   HGB 13.6 06/22/2020   HCT 40.7 06/22/2020   MCV 93 06/22/2020   PLT 327 06/22/2020   Lab Results  Component Value Date   IRON 38 (L) 10/08/2015   TIBC 416 10/08/2015   FERRITIN 27 10/08/2015   Attestation  Statements:   Reviewed by clinician on day of visit: allergies, medications, problem list, medical history, surgical history, family history, social history, and previous encounter notes.   I, Trixie Dredge, am acting as transcriptionist for Dennard Nip, MD.  I have reviewed the above documentation for accuracy and completeness, and I agree with the above. -  Dennard Nip, MD

## 2020-11-30 ENCOUNTER — Other Ambulatory Visit: Payer: Self-pay

## 2020-11-30 DIAGNOSIS — I2699 Other pulmonary embolism without acute cor pulmonale: Secondary | ICD-10-CM

## 2020-11-30 DIAGNOSIS — Z7901 Long term (current) use of anticoagulants: Secondary | ICD-10-CM

## 2020-12-01 MED ORDER — WARFARIN SODIUM 7.5 MG PO TABS
ORAL_TABLET | ORAL | 0 refills | Status: DC
Start: 2020-12-01 — End: 2020-12-02

## 2020-12-02 ENCOUNTER — Other Ambulatory Visit: Payer: Self-pay

## 2020-12-02 DIAGNOSIS — Z7901 Long term (current) use of anticoagulants: Secondary | ICD-10-CM

## 2020-12-02 DIAGNOSIS — I2699 Other pulmonary embolism without acute cor pulmonale: Secondary | ICD-10-CM

## 2020-12-02 MED ORDER — WARFARIN SODIUM 7.5 MG PO TABS: ORAL_TABLET | ORAL | 0 refills | Status: DC

## 2020-12-06 ENCOUNTER — Ambulatory Visit: Payer: 59 | Attending: Family Medicine

## 2020-12-06 ENCOUNTER — Other Ambulatory Visit: Payer: Self-pay

## 2020-12-06 DIAGNOSIS — G8929 Other chronic pain: Secondary | ICD-10-CM | POA: Diagnosis present

## 2020-12-06 DIAGNOSIS — M6281 Muscle weakness (generalized): Secondary | ICD-10-CM | POA: Diagnosis present

## 2020-12-06 DIAGNOSIS — M545 Low back pain, unspecified: Secondary | ICD-10-CM | POA: Insufficient documentation

## 2020-12-06 NOTE — Therapy (Addendum)
[place], [place], [address] Phone: [phone]   Fax:  [phone]  Physical Therapy Evaluation/Discharge  Patient Details  Name: Alexa [name] MRN: [id number] Date of Birth: 1975/12/05 Referring Provider (PT): Dennard Nip D, MD   Encounter Date: 12/06/2020   PT End of Session - 12/06/20 1232     Visit Number 1    Number of Visits 3    Date for PT Re-Evaluation 12/24/20    Authorization Type [employer]    PT Start Time 1233    PT Stop Time 1310    PT Time Calculation (min) 37 min    Activity Tolerance Patient tolerated treatment well    Behavior During Therapy [facility] for tasks assessed/performed             Past Medical History:  Diagnosis Date   Anemia    Back pain    Bilateral swelling of feet    CHF (congestive heart failure) ([place])    Diabetes mellitus without complication ([place])    Phreesia 03/02/2020   DM2 (diabetes mellitus, type 2) ([place])    Hypertension    Joint pain    Nocturnal hypoxia    Obstructive sleep apnea syndrome, moderate    Pulmonary embolism ([place]) 05/14/2016   submassive, on chronic anticoagulation   Pulmonary hypertension ([place])    Vitamin D deficiency     Past Surgical History:  Procedure Laterality Date   CESAREAN SECTION      There were no vitals filed for this visit.    Subjective Assessment - 12/06/20 1233     Subjective Patient reports she is having intermittent back pain that is exacerbated by her menstrual cycle. During the first few days of her cycle is when her pain is the worst. She does some stretching, which helps. No pain currently. She reports pain at worst rated as 6/10 described as a big cramp that won't go away with the beginning of her cycle. She does have low back pain outside of her menstrual cycle, but thinks this could be related to her sleep position. She denies any numbness or tingling. No red flag symptoms. She reports the pain  has been ongoing for at least two years. Her weight management MD did send a referral to OB-GYN, but nothing has been scheduled at this time.    Pertinent History CHF, history of pulmonary embolism, obesity, HTN    How long can you sit comfortably? a couple hours    How long can you stand comfortably? maybe 5-10 minutes    How long can you walk comfortably? "I haven't thought about it."    Patient Stated Goals To get some strength    Currently in Pain? No/denies                [facility] PT Assessment - 12/06/20 0001       Assessment   Medical Diagnosis M54.50 (ICD-10-CM) - Intermittent low back pain    Referring Provider (PT) Starlyn Skeans, MD    Onset Date/Surgical Date --   > 2 years   Hand Dominance Right    Next MD Visit 12/14/20    Prior Therapy no      Precautions   Precautions None      Restrictions   Weight Bearing Restrictions No      Balance Screen   Has the patient fallen in the past 6 months No      Home Environment   Living  Environment Private residence    Living Arrangements Children    Type of [place]    Additional Comments 1 stair to enter      Prior Function   Level of Independence Independent    Vocation Full time employment    Vocation Requirements works from home    Leisure sing, dance, hang out with family      Observation/Other Assessments   Focus on Therapeutic Outcomes (FOTO)  52% to 65%      AROM   Lumbar Flexion WNL    Lumbar Extension WNL    Lumbar - Right Side Bend WNL    Lumbar - Left Side Bend WNL    Lumbar - Right Rotation WNL    Lumbar - Left Rotation WNL      Strength   Right Hip Flexion 4+/5    Right Hip Extension 4-/5    Right Hip ABduction 4-/5    Left Hip Flexion 4+/5    Left Hip Extension 4-/5    Left Hip ABduction 4-/5    Right Knee Flexion 5/5    Right Knee Extension 5/5    Left Knee Flexion 5/5    Left Knee Extension 5/5      Flexibility   Hamstrings WNL    Quadriceps 75% limited bilaterally     Piriformis tight bilaterally      Transfers   Five time sit to stand comments  13 seconds      Ambulation/Gait   Ambulation/Gait Yes    Gait Comments WBOS, lateral trunk lean, limited hip flexion/extension throughout gait cycle bilaterally                        Objective measurements completed on examination: See above findings.       [place] Adult PT Treatment/Exercise - 12/06/20 0001       Self-Care   Self-Care Other Self-Care Comments    Other Self-Care Comments  see patient education                     PT Education - 12/06/20 1550     Education Details Education on current condition, POC, HEP, FOTO score, recommend f/u with OB-GYN    Person(s) Educated Patient    Methods Explanation;Demonstration;Handout;Verbal cues;Tactile cues    Comprehension Verbalized understanding;Returned demonstration;Verbal cues required              PT Short Term Goals - 12/06/20 1251       PT SHORT TERM GOAL #1   Title STG=LTG               PT Long Term Goals - 12/06/20 1251       PT LONG TERM GOAL #1   Title Patient will be independent with advanced HEP to assist in management of her chronic condition.    Baseline initial HEP issued    Status New    Target Date 12/24/20      PT LONG TERM GOAL #2   Title Patient will demonstrate at least 4/5 bilateral hip abductor and extensor strength to improve stability about the chain with walking.    Status New    Target Date 12/24/20      PT LONG TERM GOAL #3   Title Patient will complete 5xSTS in less than 11 seconds to signify improvements in functional strength.    Baseline 13 seconds    Status New    Target Date 12/24/20  Plan - 12/06/20 1529     Clinical Impression Statement Patient is a 45 y/o female with chief complaint of intermittent low back pain that she mostly correlates to her menstrual cycle. She reports her back pain is the worst during the first few days  of her cycle and utliizes stretching and mobility activity to reduce her pain. Upon assessment she has full and pain free lumbar AROM, mild hip extensors/abductor weakness, and tightness in bilateral piriformis and quadriceps. She was issused an HEP to address her weakness and decreased flexibility and will benefit from 1-2 additional sessions to train in advanced home program to include stretching and strengthening exercises that she can complete independently to assist in reducing her back pain when she has flare ups. It was also recommended that she f/u regarding her OB-GYN referral as her symptoms appear to be directly related to her cycle with patient verbalizing understanding.    Personal Factors and Comorbidities Comorbidity 3+;Time since onset of injury/illness/exacerbation;Fitness    Comorbidities see PMH above    Examination-Activity Limitations Stand;Locomotion Level    Stability/Clinical Decision Making Stable/Uncomplicated    Clinical Decision Making Low    Rehab Potential Good    PT Frequency 1x / week    PT Duration 2 weeks    PT Treatment/Interventions ADLs/Self Care Home Management;Moist Heat;Cryotherapy;Gait training;Stair training;Functional mobility training;Therapeutic activities;Therapeutic exercise;Balance training;Neuromuscular re-education;Manual techniques    PT Next Visit Plan hip flexibilty, hip strengthening    PT Home Exercise Plan Access code: [email or web address]    Recommended Other Services OB-GYN    Consulted and Agree with Plan of Care Patient             Patient will benefit from skilled therapeutic intervention in order to improve the following deficits and impairments:  Decreased strength, Pain, Impaired flexibility  Visit Diagnosis: Chronic bilateral low back pain without sciatica  Muscle weakness (generalized)     Problem List Patient Active Problem List   Diagnosis Date Noted   Atrial fibrillation ([place]) 03/09/2020   Acute venous embolism and thrombosis  of deep vessels of proximal lower extremity ([place]) 03/09/2020   Bilateral lower extremity edema 06/08/2019   Irregular menses 01/14/2019   Encounter for therapeutic drug monitoring 11/12/2018   COR (chronic cor pulmonale) (HCC)    OSA (obstructive sleep apnea) 06/26/2017   Pulmonary hypertension ([place]) 06/26/2017   Personal history of venous thrombosis and embolism 10/06/2016   Pelvic lymphadenopathy 10/06/2016   Adenopathy 08/08/2016   Aortic thrombus ([place]) 06/19/2016   Long term (current) use of anticoagulants [id number]   Diastolic dysfunction [id number]   HCD (hypertensive cardiovascular disease) 06/19/2016   History of pulmonary embolism 05/14/2016   Acute respiratory failure with hypoxia ([place]) 05/14/2016   Hypoxia    Class 3 obesity due to excess calories with serious comorbidity and body mass index (BMI) greater than or equal to 70 in adult 04/15/2016   Athlete's foot 10/08/2015   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 [place], [place], [address] Phone: [phone]   Fax:  [phone]  Name: SHANTIL WOOTAN MRN: [id number] Date of Birth: Aug 10, 1975  Raeford Razor, PT 02/27/21 8:43 AM Phone: [phone] Fax: [phone]

## 2020-12-07 ENCOUNTER — Other Ambulatory Visit: Payer: Self-pay

## 2020-12-07 ENCOUNTER — Ambulatory Visit (INDEPENDENT_AMBULATORY_CARE_PROVIDER_SITE_OTHER): Payer: 59 | Admitting: Family Medicine

## 2020-12-07 ENCOUNTER — Encounter: Payer: Self-pay | Admitting: Family Medicine

## 2020-12-07 VITALS — BP 132/80 | HR 91 | Temp 98.5°F | Resp 16 | Wt >= 6400 oz

## 2020-12-07 DIAGNOSIS — Z6841 Body Mass Index (BMI) 40.0 and over, adult: Secondary | ICD-10-CM

## 2020-12-07 DIAGNOSIS — F411 Generalized anxiety disorder: Secondary | ICD-10-CM | POA: Diagnosis not present

## 2020-12-07 NOTE — Progress Notes (Signed)
New Patient Office Visit  Subjective:  Patient ID: Alexa [name], female    DOB: Nov 30, 1975  Age: 45 y.o. MRN: [id number]  CC:  Chief Complaint  Patient presents with   Follow-up    depression   Medication Refill    HPI [name] presents for for follow-up of anxiety.  She was placed on Paxil last visit and reports taking them as directed but feels that she had no improvement.  She also reports that the social stressors that are contributing have pretty much resolved at this time.  She also has been working towards the weight loss.  Past Medical History:  Diagnosis Date   Anemia    Back pain    Bilateral swelling of feet    CHF (congestive heart failure) (HCC)    Diabetes mellitus without complication ([place])    Phreesia 03/02/2020   DM2 (diabetes mellitus, type 2) (HCC)    Hypertension    Joint pain    Nocturnal hypoxia    Obstructive sleep apnea syndrome, moderate    Pulmonary embolism ([place]) 05/14/2016   submassive, on chronic anticoagulation   Pulmonary hypertension (HCC)    Vitamin D deficiency        Social History   Socioeconomic History   Marital status: Divorced    Spouse name: n/a   Number of children: 2   Years of education: Not on file   Highest education level: Not on file  Occupational History   Occupation: [employer]  Tobacco Use   Smoking status: Former    Types: Cigarettes   Smokeless tobacco: Never   Tobacco comments:    only smokes when she drinks alcohol - 1 pack per week  Vaping Use   Vaping Use: Never used  Substance and Sexual Activity   Alcohol use: Yes    Alcohol/week: 4.0 - 6.0 standard drinks    Types: 4 - 6 Standard drinks or equivalent per week    Comment: mixed drinks 3-4 times/weeks   Drug use: No   Sexual activity: Not Currently  Other Topics Concern   Not on file  Social History Narrative   Her children live with her.   Ex-husband lives locally.   Social Determinants of Health   Financial Resource  Strain: Not on file  Food Insecurity: Not on file  Transportation Needs: Not on file  Physical Activity: Not on file  Stress: Not on file  Social Connections: Not on file  Intimate Partner Violence: Not on file    ROS Review of Systems  Psychiatric/Behavioral:  Negative for sleep disturbance. The patient is not nervous/anxious.   All other systems reviewed and are negative.  Objective:   Today's Vitals: BP 132/80 (BP Location: Right Wrist, Patient Position: Sitting, Cuff Size: Large)   Pulse 91   Temp 98.5 F (36.9 C) (Oral)   Resp 16   Wt (!) 403 lb 6.4 oz (183 kg)   SpO2 93%   BMI 69.24 kg/m   Physical Exam Vitals and nursing note reviewed.  Constitutional:      General: She is not in acute distress.    Appearance: She is obese.  Cardiovascular:     Rate and Rhythm: Normal rate and regular rhythm.  Pulmonary:     Effort: Pulmonary effort is normal.     Breath sounds: Normal breath sounds.  Abdominal:     Palpations: Abdomen is soft.     Tenderness: There is no abdominal tenderness.  Neurological:  General: No focal deficit present.     Mental Status: She is alert and oriented to person, place, and time.  Psychiatric:        Mood and Affect: Mood normal.        Behavior: Behavior normal.        Thought Content: Thought content normal.    Assessment & Plan:   1. Generalized anxiety disorder Patient has and will DC Paxil.  Will monitor off meds.  2. Class 3 severe obesity due to excess calories with serious comorbidity and body mass index (BMI) of 60.0 to 69.9 in adult [facility]) Patient with 14 pound weight loss since last visit.  Encouraged continued weight loss.  Outpatient Encounter Medications as of 12/07/2020  Medication Sig   acetaminophen (TYLENOL) 500 MG tablet Take 1,000 mg by mouth daily as needed for mild pain.   blood glucose meter kit and supplies KIT Use up to four times daily as directed.   cetirizine (ZYRTEC) 10 MG tablet Take 1 tablet (10 mg  total) by mouth daily.   furosemide (LASIX) 40 MG tablet Take 1 tablet (40 mg total) by mouth 2 (two) times daily.   gabapentin (NEURONTIN) 300 MG capsule TAKE 1 CAPSULE(300 MG) BY MOUTH TWICE DAILY   glucose blood test strip ascensia  Meter/strips preferred Test 3x daily Use as instructed Dx.dm   Lancets (ONETOUCH ULTRASOFT) lancets Test daily Use as instructed dm2   metFORMIN (GLUCOPHAGE) 500 MG tablet Take 1 tablet (500 mg total) by mouth 2 (two) times daily with a meal.   Misc. Devices MISC Bariatric size shower chair with handle 423 lbs, BMI 72  Dx. Pulmonary HTN, DM2 with neuropathy, morbid obesity   PARoxetine (PAXIL) 10 MG tablet Take 1 tablet (10 mg total) by mouth daily.   potassium chloride (KLOR-CON) 10 MEQ tablet TAKE 1 TABLET(10 MEQ) BY MOUTH TWICE DAILY   Semaglutide,0.25 or 0.5MG /DOS, (OZEMPIC, 0.25 OR 0.5 MG/DOSE,) 2 MG/1.5ML SOPN Inject 0.5 mg into the skin once a week.   Vitamin D, Ergocalciferol, (DRISDOL) 1.25 MG (50000 UNIT) CAPS capsule Take 1 capsule (50,000 Units total) by mouth every 7 (seven) days.   warfarin (COUMADIN) 7.5 MG tablet TAKE ONE TO 3 TABLETS DAILY OR AS DIRECTED BY THE COUMADIN CLINIC   No facility-administered encounter medications on file as of 12/07/2020.    Follow-up: Return for follow up.   Becky Sax, MD

## 2020-12-07 NOTE — Progress Notes (Signed)
Patient is here for follow-up paroxetine. Patient feels like she do not need medication anymore  Patient need refill on medication

## 2020-12-14 ENCOUNTER — Encounter (INDEPENDENT_AMBULATORY_CARE_PROVIDER_SITE_OTHER): Payer: Self-pay | Admitting: Family Medicine

## 2020-12-14 ENCOUNTER — Other Ambulatory Visit: Payer: Self-pay

## 2020-12-14 ENCOUNTER — Ambulatory Visit (INDEPENDENT_AMBULATORY_CARE_PROVIDER_SITE_OTHER): Payer: 59 | Admitting: Family Medicine

## 2020-12-14 VITALS — BP 140/80 | HR 70 | Temp 98.0°F | Ht 64.0 in | Wt 398.0 lb

## 2020-12-14 DIAGNOSIS — E1169 Type 2 diabetes mellitus with other specified complication: Secondary | ICD-10-CM | POA: Diagnosis not present

## 2020-12-14 DIAGNOSIS — Z9189 Other specified personal risk factors, not elsewhere classified: Secondary | ICD-10-CM | POA: Diagnosis not present

## 2020-12-14 DIAGNOSIS — I10 Essential (primary) hypertension: Secondary | ICD-10-CM | POA: Diagnosis not present

## 2020-12-14 DIAGNOSIS — Z6841 Body Mass Index (BMI) 40.0 and over, adult: Secondary | ICD-10-CM

## 2020-12-14 MED ORDER — SEMAGLUTIDE (1 MG/DOSE) 4 MG/3ML ~~LOC~~ SOPN: 1.0000 mg | PEN_INJECTOR | SUBCUTANEOUS | 0 refills | Status: DC

## 2020-12-14 NOTE — Progress Notes (Signed)
Chief Complaint:   OBESITY Alexa [name] is here to discuss her progress with her obesity treatment plan along with follow-up of her obesity related diagnoses. Alexa [name] is on the Category 3 Plan and states she is following her eating plan approximately 90% of the time. Alexa [name] states she is walking and dancing for 15 minutes 3 times per week.  Today's visit was #: 11 Starting weight: 434 lbs Starting date: 06/22/2020 Today's weight: 398 lbs Today's date: 12/14/2020 Total lbs lost to date: 36 Total lbs lost since last in-office visit: 2  Interim History: Alexa [name] continues to do well with weight loss on her eating plan. Her hunger is mostly controlled and she feels reasonably satisfied.  Subjective:   1. Type 2 diabetes mellitus with other specified complication, without long-term current use of insulin (HCC) Alexa [name] is stable on her diet and she is doing well with weight loss. No side effects were noted with Ozempic.  2. Essential hypertension Alexa [name] has had multiple borderline elevated blood pressure. She is on Lasix regularly. She is also on Ozempic which can decrease her blood pressure.  3. At risk for heart disease Alexa [name] is at a higher than average risk for cardiovascular disease due to obesity.   Assessment/Plan:   1. Type 2 diabetes mellitus with other specified complication, without long-term current use of insulin ([place]) Alexa [name] agreed to increase Ozempic to 1 mg q weekly with no refills. Good blood sugar control is important to decrease the likelihood of diabetic complications such as nephropathy, neuropathy, limb loss, blindness, coronary artery disease, and death. Intensive lifestyle modification including diet, exercise and weight loss are the first line of treatment for diabetes.   2. Essential hypertension Alexa [name] will increase Ozempic and will continue with weight loss. We will continue to monitor as she continues her lifestyle modifications.  3. At risk for heart  disease Alexa [name] was given approximately 15 minutes of coronary artery disease prevention counseling today. She is 45 y.o. female and has risk factors for heart disease including obesity. We discussed intensive lifestyle modifications today with an emphasis on specific weight loss instructions and strategies.   Repetitive spaced learning was employed today to elicit superior memory formation and behavioral change.  4. Obesity with current BMI 68.4 Alexa [name] is currently in the action stage of change. As such, her goal is to continue with weight loss efforts. She has agreed to the Category 3 Plan.   Exercise goals: As is.  Behavioral modification strategies: no skipping meals and meal planning and cooking strategies.  Alexa [name] has agreed to follow-up with our clinic in 2 to 3 weeks. She was informed of the importance of frequent follow-up visits to maximize her success with intensive lifestyle modifications for her multiple health conditions.   Objective:   Blood pressure 140/80, pulse 70, temperature 98 F (36.7 C), height 5\' 4"  (1.626 m), weight (!) 398 lb (180.5 kg), SpO2 94 %. Body mass index is 68.32 kg/m.  General: Cooperative, alert, well developed, in no acute distress. HEENT: Conjunctivae and lids unremarkable. Cardiovascular: Regular rhythm.  Lungs: Normal work of breathing. Neurologic: No focal deficits.   Lab Results  Component Value Date   CREATININE 0.69 11/08/2020   BUN 8 11/08/2020   NA 138 11/08/2020   K 5.1 11/08/2020   CL 97 11/08/2020   CO2 25 11/08/2020   Lab Results  Component Value Date   ALT 9 11/08/2020   AST 19 11/08/2020   ALKPHOS 53 11/08/2020   BILITOT 0.5 11/08/2020  Lab Results  Component Value Date   HGBA1C 6.2 (H) 11/08/2020   HGBA1C 6.0 (H) 06/22/2020   HGBA1C 5.8 (H) 03/02/2020   HGBA1C 6.1 (A) 02/19/2018   HGBA1C 6.1 07/30/2017   Lab Results  Component Value Date   INSULIN 3.9 11/08/2020   INSULIN 20.3 06/22/2020   Lab Results   Component Value Date   TSH 1.420 06/22/2020   Lab Results  Component Value Date   CHOL 154 06/22/2020   HDL 50 06/22/2020   LDLCALC 87 06/22/2020   LDLDIRECT 100 10/08/2015   TRIG 89 06/22/2020   CHOLHDL 3.9 03/02/2020   Lab Results  Component Value Date   VD25OH 28.1 (L) 11/08/2020   VD25OH 15.8 (L) 06/22/2020   VD25OH 6.6 06/13/2016   Lab Results  Component Value Date   WBC 7.4 06/22/2020   HGB 13.6 06/22/2020   HCT 40.7 06/22/2020   MCV 93 06/22/2020   PLT 327 06/22/2020   Lab Results  Component Value Date   IRON 38 (L) 10/08/2015   TIBC 416 10/08/2015   FERRITIN 27 10/08/2015   Attestation Statements:   Reviewed by clinician on day of visit: allergies, medications, problem list, medical history, surgical history, family history, social history, and previous encounter notes.   I, Trixie Dredge, am acting as transcriptionist for Dennard Nip, MD.  I have reviewed the above documentation for accuracy and completeness, and I agree with the above. -  Dennard Nip, MD

## 2020-12-15 ENCOUNTER — Ambulatory Visit: Payer: 59

## 2020-12-16 ENCOUNTER — Other Ambulatory Visit: Payer: Self-pay | Admitting: *Deleted

## 2020-12-16 ENCOUNTER — Telehealth: Payer: Self-pay | Admitting: Family Medicine

## 2020-12-16 DIAGNOSIS — G5793 Unspecified mononeuropathy of bilateral lower limbs: Secondary | ICD-10-CM

## 2020-12-16 DIAGNOSIS — R600 Localized edema: Secondary | ICD-10-CM

## 2020-12-16 MED ORDER — FUROSEMIDE 40 MG PO TABS: 40.0000 mg | ORAL_TABLET | Freq: Two times a day (BID) | ORAL | 0 refills | Status: DC

## 2020-12-16 MED ORDER — GABAPENTIN 300 MG PO CAPS
ORAL_CAPSULE | ORAL | 0 refills | Status: DC
Start: 2020-12-16 — End: 2021-03-08

## 2020-12-16 NOTE — Telephone Encounter (Signed)
Patient called in stating she is needing her medications refilled  furosemide (LASIX) 40 MG tablet  gabapentin (NEURONTIN) 300 MG capsule    Pharmacy  [facility] [phone] - [address], [address] AT [address]  [place], [place] [address]  Phone:  [phone]  Fax:  [phone]  DEA #:  [id number]

## 2020-12-16 NOTE — Telephone Encounter (Signed)
Refill has been sent to pharmacy.  

## 2020-12-20 ENCOUNTER — Ambulatory Visit (INDEPENDENT_AMBULATORY_CARE_PROVIDER_SITE_OTHER): Payer: 59 | Admitting: Family Medicine

## 2020-12-20 ENCOUNTER — Ambulatory Visit: Payer: 59

## 2020-12-22 ENCOUNTER — Other Ambulatory Visit: Payer: Self-pay

## 2020-12-22 ENCOUNTER — Ambulatory Visit (INDEPENDENT_AMBULATORY_CARE_PROVIDER_SITE_OTHER): Payer: 59 | Admitting: *Deleted

## 2020-12-22 DIAGNOSIS — Z5181 Encounter for therapeutic drug level monitoring: Secondary | ICD-10-CM | POA: Diagnosis not present

## 2020-12-22 DIAGNOSIS — Z7901 Long term (current) use of anticoagulants: Secondary | ICD-10-CM | POA: Diagnosis not present

## 2020-12-22 LAB — POCT INR: INR: 3.3 — AB (ref 2.0–3.0)

## 2020-12-22 NOTE — Patient Instructions (Signed)
Description   Hold warfarin today and then continue taking 1.5 tablets daily except 1 tablet on Tuesday.   INR in 4 weeks. Coumadin Clinic [phone]

## 2020-12-28 ENCOUNTER — Encounter (INDEPENDENT_AMBULATORY_CARE_PROVIDER_SITE_OTHER): Payer: Self-pay | Admitting: Family Medicine

## 2020-12-28 ENCOUNTER — Other Ambulatory Visit: Payer: Self-pay

## 2020-12-28 ENCOUNTER — Ambulatory Visit (INDEPENDENT_AMBULATORY_CARE_PROVIDER_SITE_OTHER): Payer: 59 | Admitting: Family Medicine

## 2020-12-28 VITALS — BP 137/73 | HR 77 | Temp 98.2°F | Ht 64.0 in | Wt >= 6400 oz

## 2020-12-28 DIAGNOSIS — E1169 Type 2 diabetes mellitus with other specified complication: Secondary | ICD-10-CM

## 2020-12-28 DIAGNOSIS — E559 Vitamin D deficiency, unspecified: Secondary | ICD-10-CM

## 2020-12-28 DIAGNOSIS — Z6841 Body Mass Index (BMI) 40.0 and over, adult: Secondary | ICD-10-CM

## 2020-12-28 DIAGNOSIS — Z9189 Other specified personal risk factors, not elsewhere classified: Secondary | ICD-10-CM | POA: Diagnosis not present

## 2020-12-28 MED ORDER — VITAMIN D (ERGOCALCIFEROL) 1.25 MG (50000 UNIT) PO CAPS: 50000.0000 [IU] | ORAL_CAPSULE | ORAL | 0 refills | Status: DC

## 2020-12-28 NOTE — Progress Notes (Signed)
Chief Complaint:   OBESITY Alexa [name] is here to discuss her progress with her obesity treatment plan along with follow-up of her obesity related diagnoses. Alexa [name] is on the Category 3 Plan and states she is following her eating plan approximately 90% of the time. Alexa [name] states she is doing 0 minutes 0 times per week.  Today's visit was #: 12 Starting weight: 434 lbs Starting date: 06/22/2020 Today's weight: 401 lbs Today's date: 12/28/2020 Total lbs lost to date: 33 Total lbs lost since last in-office visit: 0  Interim History: Baker has been off her normal routine and she hasn't been able to follow her plan well. She is skipping meals, but her hunger is controlled.  Subjective:   1. Type 2 diabetes mellitus with other specified complication, without long-term current use of insulin (HCC) Alexa [name] is stable on Semaglutide, and she notes decreased polyphagia. She is sometimes skipping meals due to this.  2. Vitamin D deficiency Alexa [name]'s Vit D level is stable but not yet at goal. She denies sign of over-replacement.  3. At risk for impaired metabolic function Alexa [name] is at increased risk for impaired metabolic function if she is skipping meals or decreases protein intake.  Assessment/Plan:   1. Type 2 diabetes mellitus with other specified complication, without long-term current use of insulin (HCC) [name] is to work on not skipping meals, and decrease simple carbohydrates. She will continue her medications, and we will recheck labs in 2 months. Good blood sugar control is important to decrease the likelihood of diabetic complications such as nephropathy, neuropathy, limb loss, blindness, coronary artery disease, and death. Intensive lifestyle modification including diet, exercise and weight loss are the first line of treatment for diabetes.   2. Vitamin D deficiency Low Vitamin D level contributes to fatigue and are associated with obesity, breast, and colon cancer. We will refill  prescription Vitamin D 50,000 IU every week #4 for 1 month. We will recheck labs in 2 months. Alexa [name] will follow-up for routine testing of Vitamin D, at least 2-3 times per year to avoid over-replacement.  3. At risk for impaired metabolic function Alexa [name] was given approximately 15 minutes of impaired  metabolic function prevention counseling today. We discussed intensive lifestyle modifications today with an emphasis on specific nutrition and exercise instructions and strategies.   Repetitive spaced learning was employed today to elicit superior memory formation and behavioral change.  4. Obesity with current BMI 69.0 Alexa [name] is currently in the action stage of change. As such, her goal is to continue with weight loss efforts. She has agreed to the Category 3 Plan with breakfast options.   Behavioral modification strategies: increasing lean protein intake, no skipping meals, and planning for success.  Alexa [name] has agreed to follow-up with our clinic in 3 weeks. She was informed of the importance of frequent follow-up visits to maximize her success with intensive lifestyle modifications for her multiple health conditions.   Objective:   Blood pressure 137/73, pulse 77, temperature 98.2 F (36.8 C), height 5\' 4"  (1.626 m), weight (!) 401 lb (181.9 kg), SpO2 95 %. Body mass index is 68.83 kg/m.  General: Cooperative, alert, well developed, in no acute distress. HEENT: Conjunctivae and lids unremarkable. Cardiovascular: Regular rhythm.  Lungs: Normal work of breathing. Neurologic: No focal deficits.   Lab Results  Component Value Date   CREATININE 0.69 11/08/2020   BUN 8 11/08/2020   NA 138 11/08/2020   K 5.1 11/08/2020   CL 97 11/08/2020   CO2 25  11/08/2020   Lab Results  Component Value Date   ALT 9 11/08/2020   AST 19 11/08/2020   ALKPHOS 53 11/08/2020   BILITOT 0.5 11/08/2020   Lab Results  Component Value Date   HGBA1C 6.2 (H) 11/08/2020   HGBA1C 6.0 (H) 06/22/2020    HGBA1C 5.8 (H) 03/02/2020   HGBA1C 6.1 (A) 02/19/2018   HGBA1C 6.1 07/30/2017   Lab Results  Component Value Date   INSULIN 3.9 11/08/2020   INSULIN 20.3 06/22/2020   Lab Results  Component Value Date   TSH 1.420 06/22/2020   Lab Results  Component Value Date   CHOL 154 06/22/2020   HDL 50 06/22/2020   LDLCALC 87 06/22/2020   LDLDIRECT 100 10/08/2015   TRIG 89 06/22/2020   CHOLHDL 3.9 03/02/2020   Lab Results  Component Value Date   VD25OH 28.1 (L) 11/08/2020   VD25OH 15.8 (L) 06/22/2020   VD25OH 6.6 06/13/2016   Lab Results  Component Value Date   WBC 7.4 06/22/2020   HGB 13.6 06/22/2020   HCT 40.7 06/22/2020   MCV 93 06/22/2020   PLT 327 06/22/2020   Lab Results  Component Value Date   IRON 38 (L) 10/08/2015   TIBC 416 10/08/2015   FERRITIN 27 10/08/2015   Attestation Statements:   Reviewed by clinician on day of visit: allergies, medications, problem list, medical history, surgical history, family history, social history, and previous encounter notes.   I, Trixie Dredge, am acting as transcriptionist for Dennard Nip, MD.  I have reviewed the above documentation for accuracy and completeness, and I agree with the above. -  Dennard Nip, MD

## 2021-01-16 ENCOUNTER — Other Ambulatory Visit: Payer: Self-pay

## 2021-01-16 ENCOUNTER — Encounter: Payer: Self-pay | Admitting: Podiatry

## 2021-01-16 ENCOUNTER — Ambulatory Visit (INDEPENDENT_AMBULATORY_CARE_PROVIDER_SITE_OTHER): Payer: 59 | Admitting: Podiatry

## 2021-01-16 DIAGNOSIS — M79674 Pain in right toe(s): Secondary | ICD-10-CM

## 2021-01-16 DIAGNOSIS — L853 Xerosis cutis: Secondary | ICD-10-CM | POA: Diagnosis not present

## 2021-01-16 DIAGNOSIS — M79675 Pain in left toe(s): Secondary | ICD-10-CM

## 2021-01-16 DIAGNOSIS — B351 Tinea unguium: Secondary | ICD-10-CM | POA: Diagnosis not present

## 2021-01-16 DIAGNOSIS — L84 Corns and callosities: Secondary | ICD-10-CM | POA: Diagnosis not present

## 2021-01-16 DIAGNOSIS — E11628 Type 2 diabetes mellitus with other skin complications: Secondary | ICD-10-CM

## 2021-01-16 NOTE — Patient Instructions (Signed)
For extremely dry, cracked feet: moisturize feet once daily; do not apply between toes A. CeraVe Healing Ointment B. Eucerin Aquaphor Repairing Ointment (may be labeled Aquaphor Healing Ointment) C. Vaseline Petroleum Healing Jelly   If you have problems reaching your feet: apply to feet once daily; do not apply between toes A.  Eucerin Aquaphor Ointment Body Spray  B.  Vaseline Intensive Care Spray Moisturizer (Unscented,  Cocoa Radiant Spray or Aloe Smooth Spray)

## 2021-01-18 ENCOUNTER — Other Ambulatory Visit: Payer: Self-pay

## 2021-01-18 ENCOUNTER — Ambulatory Visit (INDEPENDENT_AMBULATORY_CARE_PROVIDER_SITE_OTHER): Payer: 59 | Admitting: Family Medicine

## 2021-01-18 ENCOUNTER — Encounter (INDEPENDENT_AMBULATORY_CARE_PROVIDER_SITE_OTHER): Payer: Self-pay | Admitting: Family Medicine

## 2021-01-18 VITALS — BP 127/80 | HR 77 | Temp 98.2°F | Ht 64.0 in | Wt >= 6400 oz

## 2021-01-18 DIAGNOSIS — E1169 Type 2 diabetes mellitus with other specified complication: Secondary | ICD-10-CM

## 2021-01-18 DIAGNOSIS — Z6841 Body Mass Index (BMI) 40.0 and over, adult: Secondary | ICD-10-CM

## 2021-01-18 DIAGNOSIS — G4733 Obstructive sleep apnea (adult) (pediatric): Secondary | ICD-10-CM

## 2021-01-18 DIAGNOSIS — Z9189 Other specified personal risk factors, not elsewhere classified: Secondary | ICD-10-CM | POA: Diagnosis not present

## 2021-01-18 DIAGNOSIS — E559 Vitamin D deficiency, unspecified: Secondary | ICD-10-CM

## 2021-01-18 MED ORDER — SEMAGLUTIDE (1 MG/DOSE) 4 MG/3ML ~~LOC~~ SOPN: 1.0000 mg | PEN_INJECTOR | SUBCUTANEOUS | 0 refills | Status: DC

## 2021-01-18 MED ORDER — VITAMIN D (ERGOCALCIFEROL) 1.25 MG (50000 UNIT) PO CAPS: 50000.0000 [IU] | ORAL_CAPSULE | ORAL | 0 refills | Status: DC

## 2021-01-18 NOTE — Progress Notes (Signed)
Chief Complaint:   OBESITY Alexa [name] is here to discuss her progress with her obesity treatment plan along with follow-up of her obesity related diagnoses. Alexa [name] is on the Category 3 Plan with breakfast options and states she is following her eating plan approximately 90% of the time. Alexa [name] states she is dancing and walking for 15-20 minutes 4 times per week.  Today's visit was #: 42 Starting weight: 434 lbs Starting date: 06/22/2020 Today's weight: 406 lbs Today's date: 01/18/2021 Total lbs lost to date: 28 Total lbs lost since last in-office visit: 0  Interim History: Alexa [name] has done a lot of celebration eating. She is working on meal planning and prepping, and she is ready to get back on track.  Subjective:   1. Vitamin D deficiency Alexa [name]'s Vit D level is not yet at [facility], and she notes fatigue.  2. Type 2 diabetes mellitus with other specified complication, without long-term current use of insulin (HCC) Alexa [name] is stable on Ozempic, and she is working on diet and exercise. Her recent A1c was 6.2.  3. OSA (obstructive sleep apnea) Alexa [name] is not on CPAP, and she needs an appointment with the sleep lab. She has a referral is already in place.  4. At risk for heart disease Alexa [name] is at a higher than average risk for cardiovascular disease due to obesity.   Assessment/Plan:   1. Vitamin D deficiency Low Vitamin D level contributes to fatigue and are associated with obesity, breast, and colon cancer. We will refill prescription Vitamin D 50,000 IU every week #4 for 1 month, and we will recheck labs in 1 month. Mica will follow-up for routine testing of Vitamin D, at least 2-3 times per year to avoid over-replacement.  2. Type 2 diabetes mellitus with other specified complication, without long-term current use of insulin (HCC) We will refill Ozempic 1 mg q weekly for 1 month, and we will recheck labs in 1 month. Good blood sugar control is important to decrease the likelihood of  diabetic complications such as nephropathy, neuropathy, limb loss, blindness, coronary artery disease, and death. Intensive lifestyle modification including diet, exercise and weight loss are the first line of treatment for diabetes.   3. OSA (obstructive sleep apnea) Intensive lifestyle modifications are the first line treatment for this issue. We discussed several lifestyle modifications today. Coreena will continue with weight loss efforts, and she will follow up with the sleep doctor. Orders and follow up as documented in patient record.   4. At risk for heart disease [name] was given approximately 15 minutes of coronary artery disease prevention counseling today. She is 45 y.o. female and has risk factors for heart disease including obesity. We discussed intensive lifestyle modifications today with an emphasis on specific weight loss instructions and strategies.   Repetitive spaced learning was employed today to elicit superior memory formation and behavioral change.  5. Obesity with current BMI 69.8 Alexa [name] is currently in the action stage of change. As such, her goal is to continue with weight loss efforts. She has agreed to the Category 3 Plan.   We will recheck fasting labs at her next visit.  Exercise goals: As is.  Behavioral modification strategies: meal planning and cooking strategies and holiday eating strategies .  Annett has agreed to follow-up with our clinic in 3 weeks. She was informed of the importance of frequent follow-up visits to maximize her success with intensive lifestyle modifications for her multiple health conditions.   Objective:   Blood pressure 127/80,  pulse 77, temperature 98.2 F (36.8 C), height 5\' 4"  (1.626 m), weight (!) 406 lb (184.2 kg), SpO2 98 %. Body mass index is 69.69 kg/m.  General: Cooperative, alert, well developed, in no acute distress. HEENT: Conjunctivae and lids unremarkable. Cardiovascular: Regular rhythm.  Lungs: Normal work of  breathing. Neurologic: No focal deficits.   Lab Results  Component Value Date   CREATININE 0.69 11/08/2020   BUN 8 11/08/2020   NA 138 11/08/2020   K 5.1 11/08/2020   CL 97 11/08/2020   CO2 25 11/08/2020   Lab Results  Component Value Date   ALT 9 11/08/2020   AST 19 11/08/2020   ALKPHOS 53 11/08/2020   BILITOT 0.5 11/08/2020   Lab Results  Component Value Date   HGBA1C 6.2 (H) 11/08/2020   HGBA1C 6.0 (H) 06/22/2020   HGBA1C 5.8 (H) 03/02/2020   HGBA1C 6.1 (A) 02/19/2018   HGBA1C 6.1 07/30/2017   Lab Results  Component Value Date   INSULIN 3.9 11/08/2020   INSULIN 20.3 06/22/2020   Lab Results  Component Value Date   TSH 1.420 06/22/2020   Lab Results  Component Value Date   CHOL 154 06/22/2020   HDL 50 06/22/2020   LDLCALC 87 06/22/2020   LDLDIRECT 100 10/08/2015   TRIG 89 06/22/2020   CHOLHDL 3.9 03/02/2020   Lab Results  Component Value Date   VD25OH 28.1 (L) 11/08/2020   VD25OH 15.8 (L) 06/22/2020   VD25OH 6.6 06/13/2016   Lab Results  Component Value Date   WBC 7.4 06/22/2020   HGB 13.6 06/22/2020   HCT 40.7 06/22/2020   MCV 93 06/22/2020   PLT 327 06/22/2020   Lab Results  Component Value Date   IRON 38 (L) 10/08/2015   TIBC 416 10/08/2015   FERRITIN 27 10/08/2015   Attestation Statements:   Reviewed by clinician on day of visit: allergies, medications, problem list, medical history, surgical history, family history, social history, and previous encounter notes.   I, Trixie Dredge, am acting as transcriptionist for Dennard Nip, MD.  I have reviewed the above documentation for accuracy and completeness, and I agree with the above. -  Dennard Nip, MD

## 2021-01-19 ENCOUNTER — Ambulatory Visit (INDEPENDENT_AMBULATORY_CARE_PROVIDER_SITE_OTHER): Payer: 59

## 2021-01-19 DIAGNOSIS — Z7901 Long term (current) use of anticoagulants: Secondary | ICD-10-CM

## 2021-01-19 LAB — POCT INR: INR: 2.7 (ref 2.0–3.0)

## 2021-01-19 NOTE — Patient Instructions (Signed)
continue taking 1.5 tablets daily except 1 tablet on Tuesday.   INR in 6 weeks. Coumadin Clinic [phone]

## 2021-01-22 ENCOUNTER — Encounter: Payer: Self-pay | Admitting: Podiatry

## 2021-01-22 NOTE — Progress Notes (Signed)
  Subjective:  Patient ID: Alexa [name], female    DOB: 02-25-1976,  MRN: [id number]  Alexa [name] presents to clinic today for preventative diabetic foot care and callus(es) b/l feet and painful thick toenails that are difficult to trim. Painful toenails interfere with ambulation. Aggravating factors include wearing enclosed shoe gear. Pain is relieved with periodic professional debridement. Painful calluses are aggravated when weightbearing with and without shoegear. Pain is relieved with periodic professional debridement.  She voices no new pedal concerns on today's visit.  Patient does not monitor blood glucose daily.  PCP is Dorna Mai, MD , and last visit was 12/07/2020.  Allergies  Allergen Reactions   Penicillins Hives    Has patient had a PCN reaction causing immediate rash, facial/tongue/throat swelling, SOB or lightheadedness with hypotension: No Has patient had a PCN reaction causing severe rash involving mucus membranes or skin necrosis: No Has patient had a PCN reaction that required hospitalization No Has patient had a PCN reaction occurring within the last 10 years: No If all of the above answers are "NO", then may proceed with Cephalosporin use.    Review of Systems: Negative except as noted in the HPI. Objective:   Constitutional Alexa [name] is a pleasant 45 y.o. African American female, morbidly obese in NAD. AAO x 3.   Vascular Capillary refill time to digits immediate b/l. Palpable DP pulse(s) b/l lower extremities Palpable PT pulse(s) b/l lower extremities Pedal hair sparse. Lower extremity skin temperature gradient within normal limits. No pain with calf compression b/l. +1 pitting edema noted BLE. No cyanosis or clubbing noted.  Neurologic Normal speech. Oriented to person, place, and time. Protective sensation intact 5/5 intact bilaterally with 10g monofilament b/l.  Dermatologic No open wounds b/l LE. No interdigital macerations noted b/l LE.  Toenails 1-5 b/l elongated, discolored, dystrophic, thickened, crumbly with subungual debris and tenderness to dorsal palpation. Hyperkeratotic lesion(s) plantar aspect of heel b/l feet.  No erythema, no edema, no drainage, no fluctuance. Pedal skin noted to be dry b/l lower extremities.  Orthopedic: Normal muscle strength 5/5 to all lower extremity muscle groups bilaterally. No pain crepitus or joint limitation noted with ROM b/l lower extremities. Pes planus deformity noted b/l lower extremities.   Radiographs: None Assessment:   1. Pain due to onychomycosis of toenails of both feet   2. Callus of foot   3. Xerosis cutis   4. Type 2 diabetes mellitus with other skin complication, without long-term current use of insulin ([place])    Plan:  Patient was evaluated and treated and all questions answered. Consent given for treatment as described below: -Continue diabetic foot care principles: inspect feet daily, monitor glucose as recommended by PCP and/or Endocrinologist, and follow prescribed diet per PCP, Endocrinologist and/or dietician. -Toenails 1-5 left and 1-5 right debrided in length and girth without iatrogenic bleeding with sterile nail nipper and dremel.  -Callus(es) plantar aspect b/l heel pads pared utilizing sterile scalpel blade without complication or incident. Total number debrided =2. -For dry skin, patient was given written list of OTC moisturizers. Patient/POA instructed to apply to foot/feet once daily avoiding application between toes.  -Patient/POA to call should there be question/concern in the interim.  Return in about 3 months (around 04/18/2021).  Marzetta Board, DPM

## 2021-02-08 ENCOUNTER — Ambulatory Visit (INDEPENDENT_AMBULATORY_CARE_PROVIDER_SITE_OTHER): Payer: 59 | Admitting: Family Medicine

## 2021-02-08 ENCOUNTER — Encounter (INDEPENDENT_AMBULATORY_CARE_PROVIDER_SITE_OTHER): Payer: Self-pay | Admitting: Family Medicine

## 2021-02-08 ENCOUNTER — Other Ambulatory Visit: Payer: Self-pay

## 2021-02-08 VITALS — BP 138/86 | HR 84 | Temp 98.4°F | Ht 64.0 in | Wt 394.0 lb

## 2021-02-08 DIAGNOSIS — E559 Vitamin D deficiency, unspecified: Secondary | ICD-10-CM

## 2021-02-08 DIAGNOSIS — R600 Localized edema: Secondary | ICD-10-CM

## 2021-02-08 DIAGNOSIS — Z6841 Body Mass Index (BMI) 40.0 and over, adult: Secondary | ICD-10-CM

## 2021-02-08 DIAGNOSIS — E1169 Type 2 diabetes mellitus with other specified complication: Secondary | ICD-10-CM

## 2021-02-08 MED ORDER — POTASSIUM CHLORIDE CRYS ER 10 MEQ PO TBCR: EXTENDED_RELEASE_TABLET | ORAL | 2 refills | Status: DC

## 2021-02-08 MED ORDER — VITAMIN D (ERGOCALCIFEROL) 1.25 MG (50000 UNIT) PO CAPS: 50000.0000 [IU] | ORAL_CAPSULE | ORAL | 0 refills | Status: DC

## 2021-02-08 MED ORDER — METFORMIN HCL 500 MG PO TABS: 500.0000 mg | ORAL_TABLET | Freq: Two times a day (BID) | ORAL | 0 refills | Status: DC

## 2021-02-08 NOTE — Progress Notes (Signed)
Chief Complaint:   OBESITY Alexa [name] is here to discuss her progress with her obesity treatment plan along with follow-up of her obesity related diagnoses. [name] is on the Category 3 Plan and states she is following her eating plan approximately 80% of the time. [name] states she is being more active.  Today's visit was #: 14 Starting weight: 434 lbs Starting date: 06/22/2020 Today's weight: 394 lbs Today's date: 02/08/2021 Total lbs lost to date: 40 Total lbs lost since last in-office visit: 12  Interim History: Alexa [name] had been retaining more fluid and this has improved. She is struggling to eat all of the food on her plan this last week. She is in a new position at her job and she is getting into this new routine. She is mostly trying to portion control and increase protein and vegetables.  Subjective:   1. Type 2 diabetes mellitus with other specified complication, without long-term current use of insulin (HCC) Alexa [name]'s recent A1c was 6.2. She is working on diet and weight loss. She notes decreased appetite on Ozempic and metformin.  2. Vitamin D deficiency Alexa [name] is stable on Vit D, and she is due for labs.  3. Bilateral lower extremity edema Alexa [name] is stable on Lasix, and she is due to have labs.  Assessment/Plan:   1. Type 2 diabetes mellitus with other specified complication, without long-term current use of insulin (HCC) We will check labs today. Tapanga will continue Ozempic, and we will refill metformin for 1 month. Good blood sugar control is important to decrease the likelihood of diabetic complications such as nephropathy, neuropathy, limb loss, blindness, coronary artery disease, and death. Intensive lifestyle modification including diet, exercise and weight loss are the first line of treatment for diabetes.   - metFORMIN (GLUCOPHAGE) 500 MG tablet; Take 1 tablet (500 mg total) by mouth 2 (two) times daily with a meal.  Dispense: 60 tablet; Refill: 0 - CMP14+EGFR -  Lipid Panel With LDL/HDL Ratio - Insulin, random - Hemoglobin A1c  2. Vitamin D deficiency Low Vitamin D level contributes to fatigue and are associated with obesity, breast, and colon cancer. We will check labs today, and we will refill prescription Vitamin D for 1 month. [name] will follow-up for routine testing of Vitamin D, at least 2-3 times per year to avoid over-replacement.  - Vitamin D, Ergocalciferol, (DRISDOL) 1.25 MG (50000 UNIT) CAPS capsule; Take 1 capsule (50,000 Units total) by mouth every 7 (seven) days.  Dispense: 4 capsule; Refill: 0 - Vitamin B12 - VITAMIN D 25 Hydroxy (Vit-D Deficiency, Fractures)  3. Bilateral lower extremity edema We will check labs today. Avamarie will continue Lasix, and we will refill KCI for 1 month with 2 refills.  - potassium chloride (KLOR-CON) 10 MEQ tablet; TAKE 1 TABLET(10 MEQ) BY MOUTH TWICE DAILY  Dispense: 60 tablet; Refill: 2  4. Obesity BMI today is 78 Alexa [name] is currently in the action stage of change. As such, her goal is to continue with weight loss efforts. She has agreed to the Category 3 Plan.   Exercise goals: As is.  Behavioral modification strategies: increasing lean protein intake, no skipping meals, and holiday eating strategies .  Alexa [name] has agreed to follow-up with our clinic in 3 weeks. She was informed of the importance of frequent follow-up visits to maximize her success with intensive lifestyle modifications for her multiple health conditions.   Alexa [name] was informed we would discuss her lab results at her next visit unless there is a critical  issue that needs to be addressed sooner. Alexa [name] agreed to keep her next visit at the agreed upon time to discuss these results.  Objective:   Blood pressure 138/86, pulse 84, temperature 98.4 F (36.9 C), height $RemoveBe'5\' 4"'fifRHctku$  (1.626 m), weight (!) 394 lb (178.7 kg), SpO2 98 %. Body mass index is 67.63 kg/m.  General: Cooperative, alert, well developed, in no acute distress. HEENT:  Conjunctivae and lids unremarkable. Cardiovascular: Regular rhythm.  Lungs: Normal work of breathing. Neurologic: No focal deficits.   Lab Results  Component Value Date   CREATININE 0.69 11/08/2020   BUN 8 11/08/2020   NA 138 11/08/2020   K 5.1 11/08/2020   CL 97 11/08/2020   CO2 25 11/08/2020   Lab Results  Component Value Date   ALT 9 11/08/2020   AST 19 11/08/2020   ALKPHOS 53 11/08/2020   BILITOT 0.5 11/08/2020   Lab Results  Component Value Date   HGBA1C 6.2 (H) 11/08/2020   HGBA1C 6.0 (H) 06/22/2020   HGBA1C 5.8 (H) 03/02/2020   HGBA1C 6.1 (A) 02/19/2018   HGBA1C 6.1 07/30/2017   Lab Results  Component Value Date   INSULIN 3.9 11/08/2020   INSULIN 20.3 06/22/2020   Lab Results  Component Value Date   TSH 1.420 06/22/2020   Lab Results  Component Value Date   CHOL 154 06/22/2020   HDL 50 06/22/2020   LDLCALC 87 06/22/2020   LDLDIRECT 100 10/08/2015   TRIG 89 06/22/2020   CHOLHDL 3.9 03/02/2020   Lab Results  Component Value Date   VD25OH 28.1 (L) 11/08/2020   VD25OH 15.8 (L) 06/22/2020   VD25OH 6.6 06/13/2016   Lab Results  Component Value Date   WBC 7.4 06/22/2020   HGB 13.6 06/22/2020   HCT 40.7 06/22/2020   MCV 93 06/22/2020   PLT 327 06/22/2020   Lab Results  Component Value Date   IRON 38 (L) 10/08/2015   TIBC 416 10/08/2015   FERRITIN 27 10/08/2015   Attestation Statements:   Reviewed by clinician on day of visit: allergies, medications, problem list, medical history, surgical history, family history, social history, and previous encounter notes.  Time spent on visit including pre-visit chart review and post-visit care and charting was 40 minutes.    I, Trixie Dredge, am acting as transcriptionist for Dennard Nip, MD.  I have reviewed the above documentation for accuracy and completeness, and I agree with the above. -  Dennard Nip, MD

## 2021-02-09 LAB — CMP14+EGFR
ALT: 13 IU/L (ref 0–32)
AST: 16 IU/L (ref 0–40)
Albumin/Globulin Ratio: 1.3 (ref 1.2–2.2)
Albumin: 4.1 g/dL (ref 3.8–4.8)
Alkaline Phosphatase: 54 IU/L (ref 44–121)
BUN/Creatinine Ratio: 12 (ref 9–23)
BUN: 9 mg/dL (ref 6–24)
Bilirubin Total: 0.5 mg/dL (ref 0.0–1.2)
CO2: 26 mmol/L (ref 20–29)
Calcium: 9 mg/dL (ref 8.7–10.2)
Chloride: 98 mmol/L (ref 96–106)
Creatinine, Ser: 0.78 mg/dL (ref 0.57–1.00)
Globulin, Total: 3.2 g/dL (ref 1.5–4.5)
Glucose: 81 mg/dL (ref 70–99)
Potassium: 4.6 mmol/L (ref 3.5–5.2)
Sodium: 139 mmol/L (ref 134–144)
Total Protein: 7.3 g/dL (ref 6.0–8.5)
eGFR: 97 mL/min/{1.73_m2} (ref >59)

## 2021-02-09 LAB — VITAMIN D 25 HYDROXY (VIT D DEFICIENCY, FRACTURES): Vit D, 25-Hydroxy: 42.6 ng/mL (ref 30.0–100.0)

## 2021-02-09 LAB — LIPID PANEL WITH LDL/HDL RATIO
Cholesterol, Total: 160 mg/dL (ref 100–199)
HDL: 42 mg/dL (ref >39)
LDL Chol Calc (NIH): 99 mg/dL (ref 0–99)
LDL/HDL Ratio: 2.4 ratio (ref 0.0–3.2)
Triglycerides: 102 mg/dL (ref 0–149)
VLDL Cholesterol Cal: 19 mg/dL (ref 5–40)

## 2021-02-09 LAB — VITAMIN B12: Vitamin B-12: 90 pg/mL — ABNORMAL LOW (ref 232–1245)

## 2021-02-09 LAB — HEMOGLOBIN A1C
Est. average glucose Bld gHb Est-mCnc: 114 mg/dL
Hgb A1c MFr Bld: 5.6 % (ref 4.8–5.6)

## 2021-02-09 LAB — INSULIN, RANDOM: INSULIN: 17.6 u[IU]/mL (ref 2.6–24.9)

## 2021-02-27 ENCOUNTER — Telehealth: Payer: Self-pay

## 2021-02-27 ENCOUNTER — Other Ambulatory Visit: Payer: Self-pay

## 2021-02-27 DIAGNOSIS — I2699 Other pulmonary embolism without acute cor pulmonale: Secondary | ICD-10-CM

## 2021-02-27 DIAGNOSIS — Z7901 Long term (current) use of anticoagulants: Secondary | ICD-10-CM

## 2021-02-27 MED ORDER — WARFARIN SODIUM 7.5 MG PO TABS: ORAL_TABLET | ORAL | 0 refills | Status: DC

## 2021-03-01 ENCOUNTER — Encounter (INDEPENDENT_AMBULATORY_CARE_PROVIDER_SITE_OTHER): Payer: Self-pay | Admitting: Family Medicine

## 2021-03-01 ENCOUNTER — Other Ambulatory Visit: Payer: Self-pay

## 2021-03-01 ENCOUNTER — Ambulatory Visit (INDEPENDENT_AMBULATORY_CARE_PROVIDER_SITE_OTHER): Payer: 59 | Admitting: Family Medicine

## 2021-03-01 VITALS — BP 108/69 | HR 80 | Temp 98.2°F | Ht 64.0 in | Wt >= 6400 oz

## 2021-03-01 DIAGNOSIS — E538 Deficiency of other specified B group vitamins: Secondary | ICD-10-CM

## 2021-03-01 DIAGNOSIS — E1169 Type 2 diabetes mellitus with other specified complication: Secondary | ICD-10-CM

## 2021-03-01 DIAGNOSIS — E559 Vitamin D deficiency, unspecified: Secondary | ICD-10-CM | POA: Diagnosis not present

## 2021-03-01 DIAGNOSIS — Z9189 Other specified personal risk factors, not elsewhere classified: Secondary | ICD-10-CM | POA: Diagnosis not present

## 2021-03-01 DIAGNOSIS — Z6841 Body Mass Index (BMI) 40.0 and over, adult: Secondary | ICD-10-CM

## 2021-03-01 DIAGNOSIS — E1159 Type 2 diabetes mellitus with other circulatory complications: Secondary | ICD-10-CM

## 2021-03-01 DIAGNOSIS — I152 Hypertension secondary to endocrine disorders: Secondary | ICD-10-CM

## 2021-03-01 DIAGNOSIS — E785 Hyperlipidemia, unspecified: Secondary | ICD-10-CM

## 2021-03-01 MED ORDER — SEMAGLUTIDE (1 MG/DOSE) 4 MG/3ML ~~LOC~~ SOPN
1.0000 mg | PEN_INJECTOR | SUBCUTANEOUS | 0 refills | Status: DC
Start: 2021-03-01 — End: 2021-04-12

## 2021-03-01 MED ORDER — CYANOCOBALAMIN 500 MCG PO TABS: ORAL_TABLET | ORAL | Status: DC

## 2021-03-01 MED ORDER — METFORMIN HCL 500 MG PO TABS: 500.0000 mg | ORAL_TABLET | Freq: Two times a day (BID) | ORAL | 0 refills | Status: DC

## 2021-03-02 ENCOUNTER — Ambulatory Visit (INDEPENDENT_AMBULATORY_CARE_PROVIDER_SITE_OTHER): Payer: 59

## 2021-03-02 ENCOUNTER — Other Ambulatory Visit: Payer: Self-pay

## 2021-03-02 DIAGNOSIS — Z7901 Long term (current) use of anticoagulants: Secondary | ICD-10-CM

## 2021-03-02 DIAGNOSIS — I2699 Other pulmonary embolism without acute cor pulmonale: Secondary | ICD-10-CM

## 2021-03-02 LAB — POCT INR: INR: 1.2 — AB (ref 2.0–3.0)

## 2021-03-02 MED ORDER — WARFARIN SODIUM 7.5 MG PO TABS: ORAL_TABLET | ORAL | 0 refills | Status: DC

## 2021-03-02 NOTE — Patient Instructions (Signed)
Take 2 tablets tonight and tomorrow night and then continue taking 1.5 tablets daily except 1 tablet on Tuesday.   INR in 2 weeks. [facility] [phone]

## 2021-03-06 NOTE — Progress Notes (Signed)
Chief Complaint:   OBESITY Alexa [name] is here to discuss her progress with her obesity treatment plan along with follow-up of her obesity related diagnoses. Alexa [name] is on the Category 3 Plan and states she is following her eating plan approximately 85-90% of the time. Alexa [name] states she is walking for 10 minutes 3 times per week.  Today's visit was #: 15 Starting weight: 434 lbs Starting date: 06/22/2020 Today's weight: 400 lbs Today's date: 03/01/2021 Total lbs lost to date: 34 Total lbs lost since last in-office visit: 0  Interim History: Alexa [name] skipped a lot of her meals, and she also ate more during the holiday and more calorically dense foods. She has decreased her exercise as well. She is here to review labs that were done at her last office visit.  Subjective:   1. Type 2 diabetes mellitus with other specified complication, without long-term current use of insulin (HCC) Alexa [name] has a glucometer, but she is not checking her blood sugars at home. She notes she is not hungry ever. Her A1c is worsening and has increased to 6.2. I discussed A1c and CMP with the patient today.  2. Hypertension associated with diabetes ([place]) Alexa [name] has a history of hypertensive cardiovascular disease. She is managed by Cardiology and her primary care physician. Her blood pressure is at goal today.  BP Readings from Last 3 Encounters:  03/01/21 108/69  02/08/21 138/86  01/18/21 127/80   3. Hyperlipidemia associated with type 2 diabetes mellitus ([place]) Alexa [name]'s LDL is not at [facility] of <70. She is not on medications, and she has refused cholesterol medications in the past. I discussed labs with the patient today.  4. B12 deficiency Alexa [name]'s B12 level is not at goal. She is not taking any B12 supplementations currently. I discussed labs with the patient today.  5. Vitamin D deficiency Alexa [name]'s Vit D level is at 42.6 currently, and has much improved. I discussed labs with the patient today.  6. At risk for  heart disease Alexa [name] is at a higher than average risk for cardiovascular disease due to diabetes mellitus, hypertension, and hyperlipidemia.   Assessment/Plan:  No orders of the defined types were placed in this encounter.   Medications Discontinued During This Encounter  Medication Reason   Semaglutide, 1 MG/DOSE, 4 MG/3ML SOPN Reorder   metFORMIN (GLUCOPHAGE) 500 MG tablet Reorder     Meds ordered this encounter  Medications   metFORMIN (GLUCOPHAGE) 500 MG tablet    Sig: Take 1 tablet (500 mg total) by mouth 2 (two) times daily with a meal.    Dispense:  60 tablet    Refill:  0   Semaglutide, 1 MG/DOSE, 4 MG/3ML SOPN    Sig: Inject 1 mg as directed once a week.    Dispense:  3 mL    Refill:  0   vitamin B-12 (CYANOCOBALAMIN) 500 MCG tablet    Sig: [phone] mcg once daily     1. Type 2 diabetes mellitus with other specified complication, without long-term current use of insulin (HCC) Alexa [name] will continue her medications, and we will refill metformin and Ozempic for 1 month. Good blood sugar control is important to decrease the likelihood of diabetic complications such as nephropathy, neuropathy, limb loss, blindness, coronary artery disease, and death. Intensive lifestyle modification including diet, exercise and weight loss are the first line of treatment for diabetes.   - metFORMIN (GLUCOPHAGE) 500 MG tablet; Take 1 tablet (500 mg total) by mouth 2 (two) times daily with a  meal.  Dispense: 60 tablet; Refill: 0 - Semaglutide, 1 MG/DOSE, 4 MG/3ML SOPN; Inject 1 mg as directed once a week.  Dispense: 3 mL; Refill: 0  2. Hypertension associated with diabetes ([place]) Alexa [name] is to decrease salt in her diet, and continue with weight loss per her prudent nutritional plan as she continues her lifestyle modifications.  3. Hyperlipidemia associated with type 2 diabetes mellitus ([place]) Cardiovascular risk and specific lipid/LDL goals reviewed. We discussed several lifestyle modifications  today. Alexa [name] doesn't want more medications, and she wishes to focus on 3 months of diet and exercise. We will recheck labs at that time, and if not at goal then she will start medications. Orders and follow up as documented in patient record.   Counseling Intensive lifestyle modifications are the first line treatment for this issue. Dietary changes: Increase soluble fiber. Decrease simple carbohydrates. Exercise changes: Moderate to vigorous-intensity aerobic activity 150 minutes per week if tolerated. Lipid-lowering medications: see documented in medical record.  4. B12 deficiency The diagnosis was reviewed with the patient. Counseling provided today, see below. We will continue to monitor. Alexa [name] agreed to start B12 [id number] mcg q daily OTC. Orders and follow up as documented in patient record.  Counseling The body needs vitamin B12: to make red blood cells; to make DNA; and to help the nerves work properly so they can carry messages from the brain to the body.  The main causes of vitamin B12 deficiency include dietary deficiency, digestive diseases, pernicious anemia, and having a surgery in which part of the stomach or small intestine is removed.  Certain medicines can make it harder for the body to absorb vitamin B12. These medicines include: heartburn medications; some antibiotics; some medications used to treat diabetes, gout, and high cholesterol.  In some cases, there are no symptoms of this condition. If the condition leads to anemia or nerve damage, various symptoms can occur, such as weakness or fatigue, shortness of breath, and numbness or tingling in your hands and feet.   Treatment:  May include taking vitamin B12 supplements.  Avoid alcohol.  Eat lots of healthy foods that contain vitamin B12: Beef, pork, chicken, [place], and organ meats, such as liver.  Seafood: This includes clams, rainbow trout, salmon, tuna, and haddock. Eggs.  Cereal and dairy products that are fortified:  This means that vitamin B12 has been added to the food.   - vitamin B-12 (CYANOCOBALAMIN) 500 MCG tablet; [id number] mcg once daily  5. Vitamin D deficiency Alexa [name] will continue prescription Vitamin D 50,000 IU every week and will follow-up for routine testing of Vitamin D, at least 2-3 times per year to avoid over-replacement.  6. At risk for heart disease Due to Alexa [name]'s current state of health and medical condition(s), she is at a higher risk for heart disease. This puts the patient at much greater risk to subsequently develop cardiopulmonary conditions that can significantly affect patient's quality of life in a negative manner.    At least 22 minutes were spent on counseling Alexa [name] about these concerns today, and I stressed the importance of reversing risks factors of obesity, especially truncal and visceral fat, hypertension, hyperlipidemia, and pre-diabetes.  The initial goal is to lose at least 5-10% of starting weight to help reduce these risk factors.  Counseling:  Intensive lifestyle modifications were discussed with Shekia as the most appropriate first line of treatment.  she will continue to work on diet, exercise, and weight loss efforts.  We will continue to reassess these conditions on  a fairly regular basis in an attempt to decrease the patient's overall morbidity and mortality.  Evidence-based interventions for health behavior change were utilized today including the discussion of self monitoring techniques, problem-solving barriers, and SMART goal setting techniques.  Specifically, regarding patient's less desirable eating habits and patterns, we employed the technique of small changes when Alexa [name] has not been able to fully commit to her prudent nutritional plan.  7. Obesity Today is 25 Alexa [name] is currently in the action stage of change. As such, her goal is to continue with weight loss efforts. She has agreed to the Category 3 Plan.   Exercise goals: As is, but increase walking as  tolerated.  Behavioral modification strategies: increasing lean protein intake, decreasing simple carbohydrates, and planning for success.  Alexa [name] has agreed to follow-up with our clinic in 2 to 4 weeks. She was informed of the importance of frequent follow-up visits to maximize her success with intensive lifestyle modifications for her multiple health conditions.   Objective:   Blood pressure 108/69, pulse 80, temperature 98.2 F (36.8 C), height 5\' 4"  (1.626 m), weight (!) 400 lb (181.4 kg), SpO2 98 %. Body mass index is 68.66 kg/m.  General: Cooperative, alert, well developed, in no acute distress. HEENT: Conjunctivae and lids unremarkable. Cardiovascular: Regular rhythm.  Lungs: Normal work of breathing. Neurologic: No focal deficits.   Lab Results  Component Value Date   CREATININE 0.78 02/08/2021   BUN 9 02/08/2021   NA 139 02/08/2021   K 4.6 02/08/2021   CL 98 02/08/2021   CO2 26 02/08/2021   Lab Results  Component Value Date   ALT 13 02/08/2021   AST 16 02/08/2021   ALKPHOS 54 02/08/2021   BILITOT 0.5 02/08/2021   Lab Results  Component Value Date   HGBA1C 5.6 02/08/2021   HGBA1C 6.2 (H) 11/08/2020   HGBA1C 6.0 (H) 06/22/2020   HGBA1C 5.8 (H) 03/02/2020   HGBA1C 6.1 (A) 02/19/2018   Lab Results  Component Value Date   INSULIN 17.6 02/08/2021   INSULIN 3.9 11/08/2020   INSULIN 20.3 06/22/2020   Lab Results  Component Value Date   TSH 1.420 06/22/2020   Lab Results  Component Value Date   CHOL 160 02/08/2021   HDL 42 02/08/2021   LDLCALC 99 02/08/2021   LDLDIRECT 100 10/08/2015   TRIG 102 02/08/2021   CHOLHDL 3.9 03/02/2020   Lab Results  Component Value Date   VD25OH 42.6 02/08/2021   VD25OH 28.1 (L) 11/08/2020   VD25OH 15.8 (L) 06/22/2020   Lab Results  Component Value Date   WBC 7.4 06/22/2020   HGB 13.6 06/22/2020   HCT 40.7 06/22/2020   MCV 93 06/22/2020   PLT 327 06/22/2020   Lab Results  Component Value Date   IRON 38 (L)  10/08/2015   TIBC 416 10/08/2015   FERRITIN 27 10/08/2015   Attestation Statements:   Reviewed by clinician on day of visit: allergies, medications, problem list, medical history, surgical history, family history, social history, and previous encounter notes.   Wilhemena Durie, am acting as transcriptionist for [employer], DO.  I have reviewed the above documentation for accuracy and completeness, and I agree with the above. Marjory Sneddon, D.O.  The [place] was signed into law in 2016 which includes the topic of electronic health records.  This provides immediate access to information in MyChart.  This includes consultation notes, operative notes, office notes, lab results and pathology reports.  If you have any questions about what you read please let us know at your next visit so we can discuss your concerns and take corrective action if need be.  We are right here with you.

## 2021-03-08 ENCOUNTER — Encounter: Payer: Self-pay | Admitting: Family Medicine

## 2021-03-08 ENCOUNTER — Ambulatory Visit (INDEPENDENT_AMBULATORY_CARE_PROVIDER_SITE_OTHER): Payer: 59 | Admitting: Family Medicine

## 2021-03-08 ENCOUNTER — Other Ambulatory Visit: Payer: Self-pay

## 2021-03-08 VITALS — BP 146/83 | HR 81 | Temp 98.0°F | Resp 16 | Wt 394.8 lb

## 2021-03-08 DIAGNOSIS — G5793 Unspecified mononeuropathy of bilateral lower limbs: Secondary | ICD-10-CM | POA: Diagnosis not present

## 2021-03-08 DIAGNOSIS — I10 Essential (primary) hypertension: Secondary | ICD-10-CM | POA: Diagnosis not present

## 2021-03-08 DIAGNOSIS — Z23 Encounter for immunization: Secondary | ICD-10-CM | POA: Diagnosis not present

## 2021-03-08 DIAGNOSIS — Z1231 Encounter for screening mammogram for malignant neoplasm of breast: Secondary | ICD-10-CM

## 2021-03-08 DIAGNOSIS — R600 Localized edema: Secondary | ICD-10-CM

## 2021-03-08 DIAGNOSIS — Z124 Encounter for screening for malignant neoplasm of cervix: Secondary | ICD-10-CM

## 2021-03-08 DIAGNOSIS — Z6841 Body Mass Index (BMI) 40.0 and over, adult: Secondary | ICD-10-CM

## 2021-03-08 DIAGNOSIS — E1169 Type 2 diabetes mellitus with other specified complication: Secondary | ICD-10-CM | POA: Diagnosis not present

## 2021-03-08 DIAGNOSIS — E66813 Obesity, class 3: Secondary | ICD-10-CM

## 2021-03-08 MED ORDER — FUROSEMIDE 40 MG PO TABS: 40.0000 mg | ORAL_TABLET | Freq: Two times a day (BID) | ORAL | 0 refills | Status: DC

## 2021-03-08 MED ORDER — POTASSIUM CHLORIDE CRYS ER 10 MEQ PO TBCR: EXTENDED_RELEASE_TABLET | ORAL | 2 refills | Status: DC

## 2021-03-08 MED ORDER — GABAPENTIN 300 MG PO CAPS: ORAL_CAPSULE | ORAL | 0 refills | Status: DC

## 2021-03-08 NOTE — Progress Notes (Signed)
fluPatient need  referral to GYN  Patient need a referral for mammogram  Patient need a referral to ophthalmology

## 2021-03-09 ENCOUNTER — Encounter: Payer: Self-pay | Admitting: Family Medicine

## 2021-03-09 NOTE — Progress Notes (Signed)
Established Patient Office Visit  Subjective:  Patient ID: Alexa [name], female    DOB: 04/07/1975  Age: 45 y.o. MRN: [id number]  CC:  Chief Complaint  Patient presents with   Follow-up    Weight loss    HPI Alexa [name] presents for follow up of chronic med issues with med refills. Patient denies acute complaints.   Past Medical History:  Diagnosis Date   Anemia    Back pain    Bilateral swelling of feet    CHF (congestive heart failure) (HCC)    Diabetes mellitus without complication (HCC)    Phreesia 03/02/2020   DM2 (diabetes mellitus, type 2) (HCC)    Hypertension    Joint pain    Nocturnal hypoxia    Obstructive sleep apnea syndrome, moderate    Pulmonary embolism (HCC) 05/14/2016   submassive, on chronic anticoagulation   Pulmonary hypertension (HCC)    Vitamin D deficiency     Past Surgical History:  Procedure Laterality Date   CESAREAN SECTION      Family History  Problem Relation Age of Onset   Diabetes Maternal Grandmother    Breast cancer Maternal Grandmother        breast cancer    Brain cancer Maternal Grandmother    Hypertension Mother    Obesity Mother    Heart disease Father    Obesity Father    Breast cancer Maternal Aunt 27   Breast cancer Maternal Aunt 72    Social History   Socioeconomic History   Marital status: Divorced    Spouse name: n/a   Number of children: 2   Years of education: Not on file   Highest education level: Not on file  Occupational History   Occupation: [employer]  Tobacco Use   Smoking status: Former    Types: Cigarettes   Smokeless tobacco: Never   Tobacco comments:    only smokes when she drinks alcohol - 1 pack per week  Vaping Use   Vaping Use: Never used  Substance and Sexual Activity   Alcohol use: Yes    Alcohol/week: 4.0 - 6.0 standard drinks    Types: 4 - 6 Standard drinks or equivalent per week    Comment: mixed drinks 3-4 times/weeks   Drug use: No   Sexual activity: Not  Currently  Other Topics Concern   Not on file  Social History Narrative   Her children live with her.   Ex-husband lives locally.   Social Determinants of Health   Financial Resource Strain: Not on file  Food Insecurity: Not on file  Transportation Needs: Not on file  Physical Activity: Not on file  Stress: Not on file  Social Connections: Not on file  Intimate Partner Violence: Not on file    ROS Review of Systems  Cardiovascular:  Positive for leg swelling. Negative for chest pain and palpitations.  Neurological:  Positive for numbness.  All other systems reviewed and are negative.  Objective:   Today's Vitals: BP (!) 146/83    Pulse 81    Temp 98 F (36.7 C) (Oral)    Resp 16    Wt (!) 394 lb 12.8 oz (179.1 kg)    SpO2 94%    BMI 67.77 kg/m   Physical Exam  Assessment & Plan:   1. Essential hypertension Slightly elevated. Continue present management and monitor  2. Type 2 diabetes mellitus with other specified complication, without long-term current use of insulin [facility]) Referral to consultant  has patient has had some diabetic related issues in the past.   - Ambulatory referral to Ophthalmology  3. Neuropathy involving both lower extremities Continue present management. Meds refilled   - gabapentin (NEURONTIN) 300 MG capsule; TAKE ONE CAPSULE (300 MG) BY MOUTH TWICE DAILY  Dispense: 180 capsule; Refill: 0  4. Bilateral lower extremity edema Continue present management. Meds refilled.   - furosemide (LASIX) 40 MG tablet; Take 1 tablet (40 mg total) by mouth 2 (two) times daily.  Dispense: 180 tablet; Refill: 0 - potassium chloride (KLOR-CON M) 10 MEQ tablet; TAKE 1 TABLET(10 MEQ) BY MOUTH TWICE DAILY  Dispense: 60 tablet; Refill: 2  5. Class 3 severe obesity due to excess calories with serious comorbidity and body mass index (BMI) of 60.0 to 69.9 in adult [facility]) Patient with some weight loss. Goal is 4-6lbs/mo wt loss. Continue and monitor.   6. Cervical cancer  screening Referral to gyn  - Ambulatory referral to Gynecology  7. Encounter for screening mammogram for malignant neoplasm of breast Referral for mammogram m - MM Digital Screening; Future    Outpatient Encounter Medications as of 03/08/2021  Medication Sig   acetaminophen (TYLENOL) 500 MG tablet Take 1,000 mg by mouth daily as needed for mild pain.   blood glucose meter kit and supplies KIT Use up to four times daily as directed.   cetirizine (ZYRTEC) 10 MG tablet Take 1 tablet (10 mg total) by mouth daily.   glucose blood test strip ascensia  Meter/strips preferred Test 3x daily Use as instructed Dx.dm   Lancets (ONETOUCH ULTRASOFT) lancets Test daily Use as instructed dm2   metFORMIN (GLUCOPHAGE) 500 MG tablet Take 1 tablet (500 mg total) by mouth 2 (two) times daily with a meal.   Misc. Devices MISC Bariatric size shower chair with handle 423 lbs, BMI 72  Dx. Pulmonary HTN, DM2 with neuropathy, morbid obesity   Semaglutide, 1 MG/DOSE, 4 MG/3ML SOPN Inject 1 mg as directed once a week.   vitamin B-12 (CYANOCOBALAMIN) 500 MCG tablet [phone] mcg once daily   Vitamin D, Ergocalciferol, (DRISDOL) 1.25 MG (50000 UNIT) CAPS capsule Take 1 capsule (50,000 Units total) by mouth every 7 (seven) days.   warfarin (COUMADIN) 7.5 MG tablet TAKE 1 to 2 TABLETS DAILY OR AS DIRECTED BY THE COUMADIN CLINIC   [DISCONTINUED] furosemide (LASIX) 40 MG tablet Take 1 tablet (40 mg total) by mouth 2 (two) times daily.   [DISCONTINUED] gabapentin (NEURONTIN) 300 MG capsule TAKE ONE CAPSULE (300 MG) BY MOUTH TWICE DAILY   [DISCONTINUED] potassium chloride (KLOR-CON) 10 MEQ tablet TAKE 1 TABLET(10 MEQ) BY MOUTH TWICE DAILY   furosemide (LASIX) 40 MG tablet Take 1 tablet (40 mg total) by mouth 2 (two) times daily.   gabapentin (NEURONTIN) 300 MG capsule TAKE ONE CAPSULE (300 MG) BY MOUTH TWICE DAILY   potassium chloride (KLOR-CON M) 10 MEQ tablet TAKE 1 TABLET(10 MEQ) BY MOUTH TWICE DAILY   [DISCONTINUED]  PARoxetine (PAXIL) 10 MG tablet Take 1 tablet (10 mg total) by mouth daily.   No facility-administered encounter medications on file as of 03/08/2021.    Follow-up: No follow-ups on file.   Becky Sax, MD

## 2021-03-16 ENCOUNTER — Other Ambulatory Visit: Payer: Self-pay

## 2021-03-16 ENCOUNTER — Ambulatory Visit (INDEPENDENT_AMBULATORY_CARE_PROVIDER_SITE_OTHER): Payer: 59

## 2021-03-16 DIAGNOSIS — Z7901 Long term (current) use of anticoagulants: Secondary | ICD-10-CM | POA: Diagnosis not present

## 2021-03-16 LAB — POCT INR: INR: 1.9 — AB (ref 2.0–3.0)

## 2021-03-16 NOTE — Patient Instructions (Signed)
Take 2 tablets tonight and then continue taking 1.5 tablets daily except 1 tablet on Tuesday.   INR in 4 weeks. [facility] [phone]

## 2021-03-28 ENCOUNTER — Encounter (INDEPENDENT_AMBULATORY_CARE_PROVIDER_SITE_OTHER): Payer: Self-pay | Admitting: Family Medicine

## 2021-03-29 ENCOUNTER — Ambulatory Visit (INDEPENDENT_AMBULATORY_CARE_PROVIDER_SITE_OTHER): Payer: 59 | Admitting: Family Medicine

## 2021-03-29 NOTE — Telephone Encounter (Signed)
Medication was approved  

## 2021-04-12 ENCOUNTER — Encounter (INDEPENDENT_AMBULATORY_CARE_PROVIDER_SITE_OTHER): Payer: Self-pay | Admitting: Family Medicine

## 2021-04-12 ENCOUNTER — Ambulatory Visit (INDEPENDENT_AMBULATORY_CARE_PROVIDER_SITE_OTHER): Payer: 59 | Admitting: Family Medicine

## 2021-04-12 ENCOUNTER — Other Ambulatory Visit: Payer: Self-pay

## 2021-04-12 VITALS — BP 110/74 | HR 99 | Temp 98.9°F | Ht 64.0 in | Wt 394.0 lb

## 2021-04-12 DIAGNOSIS — Z6841 Body Mass Index (BMI) 40.0 and over, adult: Secondary | ICD-10-CM | POA: Diagnosis not present

## 2021-04-12 DIAGNOSIS — E559 Vitamin D deficiency, unspecified: Secondary | ICD-10-CM | POA: Diagnosis not present

## 2021-04-12 DIAGNOSIS — E1169 Type 2 diabetes mellitus with other specified complication: Secondary | ICD-10-CM | POA: Diagnosis not present

## 2021-04-12 MED ORDER — SEMAGLUTIDE (1 MG/DOSE) 4 MG/3ML ~~LOC~~ SOPN: 2.0000 mg | PEN_INJECTOR | SUBCUTANEOUS | 0 refills | Status: DC

## 2021-04-12 MED ORDER — VITAMIN D (ERGOCALCIFEROL) 1.25 MG (50000 UNIT) PO CAPS: 50000.0000 [IU] | ORAL_CAPSULE | ORAL | 0 refills | Status: DC

## 2021-04-12 MED ORDER — METFORMIN HCL 500 MG PO TABS: 500.0000 mg | ORAL_TABLET | Freq: Two times a day (BID) | ORAL | 0 refills | Status: DC

## 2021-04-12 NOTE — Progress Notes (Signed)
Chief Complaint:   OBESITY Alexa [name] is here to discuss her progress with her obesity treatment plan along with follow-up of her obesity related diagnoses. Alexa [name] is on the Category 3 Plan and states she is following her eating plan approximately 80% of the time. Alexa [name] states she is doing 0 minutes 0 times per week.  Today's visit was #: 42 Starting weight: 434 lbs Starting date: 06/22/2020 Today's weight: 394 lbs Today's date: 04/12/2021 Total lbs lost to date: 40 Total lbs lost since last in-office visit: 6  Interim History: Alexa [name] has done well with weight loss, even over the holidays. She is doing well with portion control and she has been more active. She is doing well with weight on her plan.  Subjective:   1. Type 2 diabetes mellitus with other specified complication, without long-term current use of insulin (HCC) Adeli has done very well controlling her glucose with her most recent A1c at 5.6. She is stable on metformin and Ozempic. She still notes some polyphagia.  2. Vitamin D deficiency Suly is stable on Vit D, with no side effects noted.  Assessment/Plan:   1. Type 2 diabetes mellitus with other specified complication, without long-term current use of insulin (HCC) Alexa [name] agreed to increase Ozempic to 2 mg weekly, with no refills (she is to increase to 54 clicks first); and we will refill metformin 500 mg BID #60 for 1 month. Good blood sugar control is important to decrease the likelihood of diabetic complications such as nephropathy, neuropathy, limb loss, blindness, coronary artery disease, and death. Intensive lifestyle modification including diet, exercise and weight loss are the first line of treatment for diabetes.   2. Vitamin D deficiency We will refill prescription Vitamin D 50,000 IU every week #4 for 1 month. Alexa [name] will follow-up for routine testing of Vitamin D, at least 2-3 times per year to avoid over-replacement.  3. Obesity with current BMI  67.7 Alexa [name] is currently in the action stage of change. As such, her goal is to continue with weight loss efforts. She has agreed to the Category 3 Plan.   Behavioral modification strategies: increasing lean protein intake and meal planning and cooking strategies.  Alexa [name] has agreed to follow-up with our clinic in 3 to 4 weeks. She was informed of the importance of frequent follow-up visits to maximize her success with intensive lifestyle modifications for her multiple health conditions.   Objective:   Blood pressure 110/74, pulse 99, temperature 98.9 F (37.2 C), height 5\' 4"  (1.626 m), weight (!) 394 lb (178.7 kg), SpO2 99 %. Body mass index is 67.63 kg/m.  General: Cooperative, alert, well developed, in no acute distress. HEENT: Conjunctivae and lids unremarkable. Cardiovascular: Regular rhythm.  Lungs: Normal work of breathing. Neurologic: No focal deficits.   Lab Results  Component Value Date   CREATININE 0.78 02/08/2021   BUN 9 02/08/2021   NA 139 02/08/2021   K 4.6 02/08/2021   CL 98 02/08/2021   CO2 26 02/08/2021   Lab Results  Component Value Date   ALT 13 02/08/2021   AST 16 02/08/2021   ALKPHOS 54 02/08/2021   BILITOT 0.5 02/08/2021   Lab Results  Component Value Date   HGBA1C 5.6 02/08/2021   HGBA1C 6.2 (H) 11/08/2020   HGBA1C 6.0 (H) 06/22/2020   HGBA1C 5.8 (H) 03/02/2020   HGBA1C 6.1 (A) 02/19/2018   Lab Results  Component Value Date   INSULIN 17.6 02/08/2021   INSULIN 3.9 11/08/2020   INSULIN 20.3  06/22/2020   Lab Results  Component Value Date   TSH 1.420 06/22/2020   Lab Results  Component Value Date   CHOL 160 02/08/2021   HDL 42 02/08/2021   LDLCALC 99 02/08/2021   LDLDIRECT 100 10/08/2015   TRIG 102 02/08/2021   CHOLHDL 3.9 03/02/2020   Lab Results  Component Value Date   VD25OH 42.6 02/08/2021   VD25OH 28.1 (L) 11/08/2020   VD25OH 15.8 (L) 06/22/2020   Lab Results  Component Value Date   WBC 7.4 06/22/2020   HGB 13.6  06/22/2020   HCT 40.7 06/22/2020   MCV 93 06/22/2020   PLT 327 06/22/2020   Lab Results  Component Value Date   IRON 38 (L) 10/08/2015   TIBC 416 10/08/2015   FERRITIN 27 10/08/2015   Attestation Statements:   Reviewed by clinician on day of visit: allergies, medications, problem list, medical history, surgical history, family history, social history, and previous encounter notes.   I, Trixie Dredge, am acting as transcriptionist for Dennard Nip, MD.  I have reviewed the above documentation for accuracy and completeness, and I agree with the above. -  Dennard Nip, MD

## 2021-04-13 ENCOUNTER — Ambulatory Visit (INDEPENDENT_AMBULATORY_CARE_PROVIDER_SITE_OTHER): Payer: 59

## 2021-04-13 DIAGNOSIS — Z7901 Long term (current) use of anticoagulants: Secondary | ICD-10-CM

## 2021-04-13 LAB — POCT INR: INR: 2.7 (ref 2.0–3.0)

## 2021-04-13 NOTE — Patient Instructions (Signed)
continue taking 1.5 tablets daily except 1 tablet on Tuesday.   INR in 6 weeks. [facility] [phone]

## 2021-04-20 ENCOUNTER — Other Ambulatory Visit: Payer: Self-pay | Admitting: Family Medicine

## 2021-04-20 ENCOUNTER — Ambulatory Visit
Admission: RE | Admit: 2021-04-20 | Discharge: 2021-04-20 | Disposition: A | Discharge status: Home / Self Care | Payer: 59 | Source: Ambulatory Visit | Attending: Family Medicine | Admitting: Family Medicine

## 2021-04-20 ENCOUNTER — Other Ambulatory Visit: Payer: Self-pay

## 2021-04-20 ENCOUNTER — Other Ambulatory Visit: Payer: Self-pay | Admitting: *Deleted

## 2021-04-20 ENCOUNTER — Telehealth: Payer: Self-pay | Admitting: Family Medicine

## 2021-04-20 DIAGNOSIS — N6310 Unspecified lump in the right breast, unspecified quadrant: Secondary | ICD-10-CM

## 2021-04-20 DIAGNOSIS — Z1231 Encounter for screening mammogram for malignant neoplasm of breast: Secondary | ICD-10-CM

## 2021-04-20 NOTE — Telephone Encounter (Signed)
[facility]  [phone] States pt is there for her appt and they need the order to be changed on Epic from screening to Diagnostic asap. Mentioned nurse can cosign order on Epic.

## 2021-04-20 NOTE — Telephone Encounter (Signed)
Order has been changed

## 2021-04-26 ENCOUNTER — Ambulatory Visit: Payer: Self-pay | Admitting: Podiatry

## 2021-05-09 ENCOUNTER — Ambulatory Visit (INDEPENDENT_AMBULATORY_CARE_PROVIDER_SITE_OTHER): Payer: 59 | Admitting: Family Medicine

## 2021-05-24 ENCOUNTER — Other Ambulatory Visit (INDEPENDENT_AMBULATORY_CARE_PROVIDER_SITE_OTHER): Payer: Self-pay | Admitting: Family Medicine

## 2021-05-24 DIAGNOSIS — E1169 Type 2 diabetes mellitus with other specified complication: Secondary | ICD-10-CM

## 2021-05-25 ENCOUNTER — Other Ambulatory Visit: Payer: Self-pay

## 2021-05-25 ENCOUNTER — Ambulatory Visit (INDEPENDENT_AMBULATORY_CARE_PROVIDER_SITE_OTHER): Payer: 59

## 2021-05-25 DIAGNOSIS — Z7901 Long term (current) use of anticoagulants: Secondary | ICD-10-CM

## 2021-05-25 LAB — POCT INR: INR: 3.9 — AB (ref 2.0–3.0)

## 2021-05-25 NOTE — Patient Instructions (Signed)
HOLD TODAY ONLY and then continue taking 1.5 tablets daily except 1 tablet on Tuesday.   INR in 4 weeks. [facility] [phone] ?

## 2021-06-07 ENCOUNTER — Other Ambulatory Visit (INDEPENDENT_AMBULATORY_CARE_PROVIDER_SITE_OTHER): Payer: Self-pay | Admitting: Family Medicine

## 2021-06-07 ENCOUNTER — Other Ambulatory Visit: Payer: Self-pay | Admitting: Family Medicine

## 2021-06-07 DIAGNOSIS — E559 Vitamin D deficiency, unspecified: Secondary | ICD-10-CM

## 2021-06-07 DIAGNOSIS — R600 Localized edema: Secondary | ICD-10-CM

## 2021-06-07 DIAGNOSIS — E1169 Type 2 diabetes mellitus with other specified complication: Secondary | ICD-10-CM

## 2021-06-07 NOTE — Telephone Encounter (Signed)
Dr.Beasley 

## 2021-06-07 NOTE — Telephone Encounter (Signed)
LAST APPOINTMENT DATE: 04/12/21 ?NEXT APPOINTMENT DATE:  ? ? ?[facility] [phone] - [address], [address] ?[address] ?[place] [address] ?Phone: [phone] Fax: [phone] ? ?[facility] [phone] - [address], [address] ?[address] ?[place] [address] ?Phone: [phone] Fax: [phone] ? ?Patient is requesting a refill of the following medications: ?Requested Prescriptions  ? ?Pending Prescriptions Disp Refills  ? potassium chloride (KLOR-CON M) 10 MEQ tablet [Pharmacy Med Name: POTASSIUM CL MICRO 10MEQ ER TABS] 60 tablet 2  ?  Sig: TAKE 1 TABLET(10 MEQ) BY MOUTH TWICE DAILY  ? metFORMIN (GLUCOPHAGE) 500 MG tablet [Pharmacy Med Name: METFORMIN '500MG'$  TABLETS] 60 tablet 0  ?  Sig: TAKE 1 TABLET(500 MG) BY MOUTH TWICE DAILY WITH A MEAL  ? ? ?Date last filled: 04/12/21 ?Previously prescribed by Dr. Leafy Ro ? ?Lab Results  ?Component Value Date  ? HGBA1C 5.6 02/08/2021  ? HGBA1C 6.2 (H) 11/08/2020  ? HGBA1C 6.0 (H) 06/22/2020  ? ?Lab Results  ?Component Value Date  ? MICROALBUR 1.4 01/28/2016  ? [place] 99 02/08/2021  ? CREATININE 0.78 02/08/2021  ? ?Lab Results  ?Component Value Date  ? VD25OH 42.6 02/08/2021  ? VD25OH 28.1 (L) 11/08/2020  ? VD25OH 15.8 (L) 06/22/2020  ? ? ?BP Readings from Last 3 Encounters:  ?04/12/21 110/74  ?03/08/21 (!) 146/83  ?03/01/21 108/69  ? ? ?

## 2021-06-13 ENCOUNTER — Telehealth: Payer: Self-pay

## 2021-06-13 ENCOUNTER — Other Ambulatory Visit: Payer: Self-pay | Admitting: Family Medicine

## 2021-06-13 DIAGNOSIS — R600 Localized edema: Secondary | ICD-10-CM

## 2021-06-13 NOTE — Telephone Encounter (Signed)
Pt. States she requested refills on medications through her pharmacy 06/07/21 and they have not been filled. Do not see any requests. She will call pharmacy back. Instructed to call back if she has difficulty. ?

## 2021-06-22 ENCOUNTER — Ambulatory Visit (INDEPENDENT_AMBULATORY_CARE_PROVIDER_SITE_OTHER): Payer: 59

## 2021-06-22 DIAGNOSIS — Z7901 Long term (current) use of anticoagulants: Secondary | ICD-10-CM

## 2021-06-22 LAB — POCT INR: INR: 2.7 (ref 2.0–3.0)

## 2021-06-22 NOTE — Patient Instructions (Signed)
continue taking 1.5 tablets daily except 1 tablet on Tuesday.   INR in 6 weeks. [facility] [phone] ?

## 2021-06-26 ENCOUNTER — Other Ambulatory Visit: Payer: Self-pay | Admitting: Family Medicine

## 2021-06-26 DIAGNOSIS — G5793 Unspecified mononeuropathy of bilateral lower limbs: Secondary | ICD-10-CM

## 2021-06-26 DIAGNOSIS — R600 Localized edema: Secondary | ICD-10-CM

## 2021-06-27 ENCOUNTER — Telehealth (INDEPENDENT_AMBULATORY_CARE_PROVIDER_SITE_OTHER): Payer: 59 | Admitting: Family Medicine

## 2021-06-27 ENCOUNTER — Encounter (INDEPENDENT_AMBULATORY_CARE_PROVIDER_SITE_OTHER): Payer: Self-pay | Admitting: Family Medicine

## 2021-06-27 ENCOUNTER — Encounter (INDEPENDENT_AMBULATORY_CARE_PROVIDER_SITE_OTHER): Payer: Self-pay

## 2021-06-27 ENCOUNTER — Ambulatory Visit (INDEPENDENT_AMBULATORY_CARE_PROVIDER_SITE_OTHER): Payer: 59 | Admitting: Family Medicine

## 2021-06-27 DIAGNOSIS — Z6841 Body Mass Index (BMI) 40.0 and over, adult: Secondary | ICD-10-CM | POA: Diagnosis not present

## 2021-06-27 DIAGNOSIS — E1169 Type 2 diabetes mellitus with other specified complication: Secondary | ICD-10-CM

## 2021-06-27 DIAGNOSIS — E669 Obesity, unspecified: Secondary | ICD-10-CM | POA: Diagnosis not present

## 2021-06-27 DIAGNOSIS — E559 Vitamin D deficiency, unspecified: Secondary | ICD-10-CM | POA: Diagnosis not present

## 2021-06-27 DIAGNOSIS — Z7985 Long-term (current) use of injectable non-insulin antidiabetic drugs: Secondary | ICD-10-CM

## 2021-06-27 MED ORDER — VITAMIN D (ERGOCALCIFEROL) 1.25 MG (50000 UNIT) PO CAPS: 50000.0000 [IU] | ORAL_CAPSULE | ORAL | 0 refills | Status: DC

## 2021-06-27 NOTE — Progress Notes (Signed)
?TeleHealth Visit:  ?Due to the COVID-19 pandemic, this visit was completed with telemedicine (audio/video) technology to reduce patient and provider exposure as well as to preserve personal protective equipment.  ? ?Alexa [name] has verbally consented to this TeleHealth visit. The patient is located at home, the provider is located at home. The participants in this visit include the listed provider and patient. The visit was conducted today via phone call. Patient was unable to connect to video. Length of call was 23 minutes. ? ? ?OBESITY ?Alexa [name] is here to discuss her progress with her obesity treatment plan along with follow-up of her obesity related diagnoses.  ? ?Today's visit was # 17 ?Starting weight: 434 lbs ?Starting date: 06/22/20 ?Total weight loss: 40 lbs at last in office visit. ?Weight at last in office visit: 394 lbs ?Today's reported weight:  No weight reported. ? ?Nutrition Plan: the Category 3 Plan 80% of the time. ?Hunger is well controlled. Cravings are moderately controlled.  ?Current exercise:  chair exercises and walking 3-4 days per week . ? ?Interim History: Alexa [name] has made good progress over the last year with her weight. She has not had an OV since 04/12/21 and she feels that she has lost some weight since then.. She has been off plan somewhat.  She recently experienced the death of her godson so she has been under some stress.  She skips meals, generally breakfast.  This is usually due to lack of time in the morning.  She reports that overall she is able to be more active than she used to be.  She is using the scooter less often at the store.  ?She is ready to start focusing on adhering well to the meal plan again. ?Assessment/Plan:  ?1. Type II Diabetes ?HgbA1c is at goal. ?CBGs: Not checking ?Any episodes of hypoglycemia? no ?Medication(s): Ozempic 2 mg weekly,metformin 500 mg bid.  Denies side effects other than reduced appetite with the Ozempic. ? ?Lab Results  ?Component Value Date   ? HGBA1C 5.6 02/08/2021  ? HGBA1C 6.2 (H) 11/08/2020  ? HGBA1C 6.0 (H) 06/22/2020  ? ?Lab Results  ?Component Value Date  ? MICROALBUR 1.4 01/28/2016  ? [place] 99 02/08/2021  ? CREATININE 0.78 02/08/2021  ? ? ?Plan: ?Continue both Ozempic and metformin. ? ?2. Vitamin D Deficiency ?Vitamin D is not at goal of 50 but has improved.. She is on weekly prescription Vitamin D 50,000 IU.  ?Lab Results  ?Component Value Date  ? VD25OH 42.6 02/08/2021  ? VD25OH 28.1 (L) 11/08/2020  ? VD25OH 15.8 (L) 06/22/2020  ? ? ?Plan: ?Refill prescription vitamin D 50,000 IU weekly. ? ?3. Obesity: Current BMI 67.6 ?Alexa [name] is currently in the action stage of change. As such, her goal is to continue with weight loss efforts. She has agreed to keeping a food journal and adhering to recommended goals of 1500-1700 calories and 100 gms protein.  ? ?Exercise goals: Continue current exercise regimen.  Discussed using some weights for her arm exercises ? ?Behavioral modification strategies: increasing lean protein intake, decreasing simple carbohydrates, no skipping meals, and meal planning and cooking strategies. ?Handouts sent via MyChart: Category 3 plan, vegetarian plan, egg muffin recipe, prepared breakfast options. ? ?[name] has agreed to follow-up with our clinic in 3 weeks.  ? ?No orders of the defined types were placed in this encounter. ? ? ?Medications Discontinued During This Encounter  ?Medication Reason  ? Vitamin D, Ergocalciferol, (DRISDOL) 1.25 MG (50000 UNIT) CAPS capsule Reorder  ?  ? ?Meds  ordered this encounter  ?Medications  ? Vitamin D, Ergocalciferol, (DRISDOL) 1.25 MG (50000 UNIT) CAPS capsule  ?  Sig: Take 1 capsule (50,000 Units total) by mouth every 7 (seven) days.  ?  Dispense:  4 capsule  ?  Refill:  0  ?  Order Specific Question:   Supervising Provider  ?  Answer:   Dennard Nip D [[id number]]  ?   ? ?Objective:  ? ?VITALS: Per patient if applicable, see vitals. ?GENERAL: Alert and in no acute  distress. ?CARDIOPULMONARY: No increased WOB. Speaking in clear sentences.  ?PSYCH: Pleasant and cooperative. Speech normal rate and rhythm. Affect is appropriate. Insight and judgement are appropriate. Attention is focused, linear, and appropriate.  ?NEURO: Oriented as arrived to appointment on time with no prompting.  ? ?Lab Results  ?Component Value Date  ? CREATININE 0.78 02/08/2021  ? BUN 9 02/08/2021  ? NA 139 02/08/2021  ? K 4.6 02/08/2021  ? CL 98 02/08/2021  ? CO2 26 02/08/2021  ? ?Lab Results  ?Component Value Date  ? ALT 13 02/08/2021  ? AST 16 02/08/2021  ? ALKPHOS 54 02/08/2021  ? BILITOT 0.5 02/08/2021  ? ?Lab Results  ?Component Value Date  ? HGBA1C 5.6 02/08/2021  ? HGBA1C 6.2 (H) 11/08/2020  ? HGBA1C 6.0 (H) 06/22/2020  ? HGBA1C 5.8 (H) 03/02/2020  ? HGBA1C 6.1 (A) 02/19/2018  ? ?Lab Results  ?Component Value Date  ? INSULIN 17.6 02/08/2021  ? INSULIN 3.9 11/08/2020  ? INSULIN 20.3 06/22/2020  ? ?Lab Results  ?Component Value Date  ? TSH 1.420 06/22/2020  ? ?Lab Results  ?Component Value Date  ? CHOL 160 02/08/2021  ? HDL 42 02/08/2021  ? [place] 99 02/08/2021  ? LDLDIRECT 100 10/08/2015  ? TRIG 102 02/08/2021  ? CHOLHDL 3.9 03/02/2020  ? ?Lab Results  ?Component Value Date  ? WBC 7.4 06/22/2020  ? HGB 13.6 06/22/2020  ? HCT 40.7 06/22/2020  ? MCV 93 06/22/2020  ? PLT 327 06/22/2020  ? ?Lab Results  ?Component Value Date  ? IRON 38 (L) 10/08/2015  ? TIBC 416 10/08/2015  ? FERRITIN 27 10/08/2015  ? ?Lab Results  ?Component Value Date  ? VD25OH 42.6 02/08/2021  ? VD25OH 28.1 (L) 11/08/2020  ? VD25OH 15.8 (L) 06/22/2020  ? ? ?Attestation Statements:  ? ?Reviewed by clinician on day of visit: allergies, medications, problem list, medical history, surgical history, family history, social history, and previous encounter notes. ? ? ? ?

## 2021-07-03 ENCOUNTER — Other Ambulatory Visit (INDEPENDENT_AMBULATORY_CARE_PROVIDER_SITE_OTHER): Payer: Self-pay | Admitting: Family Medicine

## 2021-07-03 DIAGNOSIS — E559 Vitamin D deficiency, unspecified: Secondary | ICD-10-CM

## 2021-07-13 ENCOUNTER — Other Ambulatory Visit: Payer: Self-pay | Admitting: Cardiovascular Disease

## 2021-07-13 DIAGNOSIS — Z7901 Long term (current) use of anticoagulants: Secondary | ICD-10-CM

## 2021-07-13 DIAGNOSIS — I2699 Other pulmonary embolism without acute cor pulmonale: Secondary | ICD-10-CM

## 2021-07-15 ENCOUNTER — Other Ambulatory Visit: Payer: Self-pay | Admitting: Cardiovascular Disease

## 2021-07-15 DIAGNOSIS — I2699 Other pulmonary embolism without acute cor pulmonale: Secondary | ICD-10-CM

## 2021-07-15 DIAGNOSIS — Z7901 Long term (current) use of anticoagulants: Secondary | ICD-10-CM

## 2021-07-20 ENCOUNTER — Encounter (INDEPENDENT_AMBULATORY_CARE_PROVIDER_SITE_OTHER): Payer: Self-pay | Admitting: Family Medicine

## 2021-07-20 ENCOUNTER — Ambulatory Visit (INDEPENDENT_AMBULATORY_CARE_PROVIDER_SITE_OTHER): Payer: 59 | Admitting: Family Medicine

## 2021-07-20 VITALS — BP 123/76 | HR 89 | Temp 97.8°F | Ht 64.0 in | Wt 395.0 lb

## 2021-07-20 DIAGNOSIS — Z7985 Long-term (current) use of injectable non-insulin antidiabetic drugs: Secondary | ICD-10-CM

## 2021-07-20 DIAGNOSIS — Z6841 Body Mass Index (BMI) 40.0 and over, adult: Secondary | ICD-10-CM

## 2021-07-20 DIAGNOSIS — F4321 Adjustment disorder with depressed mood: Secondary | ICD-10-CM

## 2021-07-20 DIAGNOSIS — E669 Obesity, unspecified: Secondary | ICD-10-CM

## 2021-07-20 DIAGNOSIS — E1169 Type 2 diabetes mellitus with other specified complication: Secondary | ICD-10-CM | POA: Diagnosis not present

## 2021-07-20 MED ORDER — SEMAGLUTIDE (1 MG/DOSE) 4 MG/3ML ~~LOC~~ SOPN: 2.0000 mg | PEN_INJECTOR | SUBCUTANEOUS | 0 refills | Status: DC

## 2021-07-20 MED ORDER — METFORMIN HCL 500 MG PO TABS: 500.0000 mg | ORAL_TABLET | Freq: Two times a day (BID) | ORAL | 0 refills | Status: DC

## 2021-07-24 ENCOUNTER — Encounter: Payer: Self-pay | Admitting: Podiatry

## 2021-07-24 ENCOUNTER — Ambulatory Visit: Payer: 59 | Admitting: Podiatry

## 2021-07-24 DIAGNOSIS — M2142 Flat foot [pes planus] (acquired), left foot: Secondary | ICD-10-CM

## 2021-07-24 DIAGNOSIS — E1142 Type 2 diabetes mellitus with diabetic polyneuropathy: Secondary | ICD-10-CM | POA: Diagnosis not present

## 2021-07-24 DIAGNOSIS — B351 Tinea unguium: Secondary | ICD-10-CM

## 2021-07-24 DIAGNOSIS — L853 Xerosis cutis: Secondary | ICD-10-CM | POA: Diagnosis not present

## 2021-07-24 DIAGNOSIS — M2141 Flat foot [pes planus] (acquired), right foot: Secondary | ICD-10-CM | POA: Diagnosis not present

## 2021-07-24 DIAGNOSIS — M79675 Pain in left toe(s): Secondary | ICD-10-CM | POA: Diagnosis not present

## 2021-07-24 DIAGNOSIS — E119 Type 2 diabetes mellitus without complications: Secondary | ICD-10-CM

## 2021-07-24 DIAGNOSIS — M79674 Pain in right toe(s): Secondary | ICD-10-CM | POA: Diagnosis not present

## 2021-07-24 DIAGNOSIS — L84 Corns and callosities: Secondary | ICD-10-CM

## 2021-08-02 NOTE — Progress Notes (Signed)
ANNUAL DIABETIC FOOT EXAM ? ?Subjective: ?Alexa [name] presents today for annual diabetic foot examination. ? ?Patient relates 5 year h/o diabetes. ? ?Patient has documented h/o foot ulcer interdigitally between 3rd/4th toes left foot, which healed via help of local wound care. Date of ulceration was 06/02/2015.. ? ?Patient has been diagnosed with neuropathy and it is managed with gabapentin. ? ?Last known  HgA1c was 5.1%. Patient does not monitor blood glucose daily. ? ?Risk factors:  chronic LE edema, h/o DVT, h/o PE, diabetes, diabetic neuropathy, history of foot/leg ulcer, HTN, CHF. ? ?Alexa Mai, Alexa [name] is patient's PCP. Last visit was March 08, 2021. ? ?Past Medical History:  ?Diagnosis Date  ? Anemia   ? Back pain   ? Bilateral swelling of feet   ? CHF (congestive heart failure) ([place])   ? Diabetes mellitus without complication ([place])   ? Phreesia 03/02/2020  ? DM2 (diabetes mellitus, type 2) ([place])   ? Hypertension   ? Joint pain   ? Nocturnal hypoxia   ? Obstructive sleep apnea syndrome, moderate   ? Pulmonary embolism ([place]) 05/14/2016  ? submassive, on chronic anticoagulation  ? Pulmonary hypertension ([place])   ? Vitamin D deficiency   ? ?Patient Active Problem List  ? Diagnosis Date Noted  ? Atrial fibrillation ([place]) 03/09/2020  ? Acute venous embolism and thrombosis of deep vessels of proximal lower extremity ([place]) 03/09/2020  ? Bilateral lower extremity edema 06/08/2019  ? Irregular menses 01/14/2019  ? Encounter for therapeutic drug monitoring 11/12/2018  ? COR (chronic cor pulmonale) ([place])   ? OSA (obstructive sleep apnea) 06/26/2017  ? Pulmonary hypertension ([place]) 06/26/2017  ? Personal history of venous thrombosis and embolism 10/06/2016  ? Pelvic lymphadenopathy 10/06/2016  ? Adenopathy 08/08/2016  ? Aortic thrombus ([place]) 06/19/2016  ? Long term (current) use of anticoagulants 06/19/2016  ? Diastolic dysfunction 38/46/6599  ? HCD (hypertensive cardiovascular disease) 06/19/2016  ? History of  pulmonary embolism 05/14/2016  ? Acute respiratory failure with hypoxia ([place]) 05/14/2016  ? Hypoxia   ? Class 3 obesity due to excess calories with serious comorbidity and body mass index (BMI) greater than or equal to 70 in adult 04/15/2016  ? Athlete's foot 10/08/2015  ? Non-insulin-dependent diabetes mellitus with neurological complications 35/70/1779  ? Benign essential HTN 06/22/2015  ? Thrombosis of left saphenous vein 06/22/2015  ? BMI 70 and over, adult ([place]) 06/22/2015  ? Vitamin D deficiency 06/22/2015  ? Iron deficiency anemia 06/22/2015  ? ?Past Surgical History:  ?Procedure Laterality Date  ? CESAREAN SECTION    ? ?Current Outpatient Medications on File Prior to Visit  ?Medication Sig Dispense Refill  ? acetaminophen (TYLENOL) 500 MG tablet Take 1,000 mg by mouth daily as needed for mild pain.    ? blood glucose meter kit and supplies KIT Use up to four times daily as directed. 1 each 0  ? cetirizine (ZYRTEC) 10 MG tablet Take 1 tablet (10 mg total) by mouth daily. 30 tablet 1  ? furosemide (LASIX) 40 MG tablet TAKE 1 TABLET(40 MG) BY MOUTH TWICE DAILY 180 tablet 0  ? gabapentin (NEURONTIN) 300 MG capsule TAKE 1 CAPSULE(300 MG) BY MOUTH TWICE DAILY 180 capsule 0  ? glucose blood test strip ascensia  Meter/strips preferred Test 3x daily Use as instructed Dx.dm 100 each 0  ? Lancets (ONETOUCH ULTRASOFT) lancets Test daily Use as instructed dm2 100 each 12  ? metFORMIN (GLUCOPHAGE) 500 MG tablet Take 1 tablet (500 mg total) by mouth 2 (two)  times daily with a meal. 60 tablet 0  ? Misc. Devices MISC Bariatric size shower chair with handle ?423 lbs, BMI 72 ? ?Dx. Pulmonary HTN, DM2 with neuropathy, morbid obesity 1 each 0  ? potassium chloride (KLOR-CON M) 10 MEQ tablet TAKE 1 TABLET(10 MEQ) BY MOUTH TWICE DAILY 60 tablet 2  ? Semaglutide, 1 MG/DOSE, 4 MG/3ML SOPN Inject 2 mg as directed once a week. 3 mL 0  ? vitamin B-12 (CYANOCOBALAMIN) 500 MCG tablet [phone] mcg once daily    ? Vitamin D,  Ergocalciferol, (DRISDOL) 1.25 MG (50000 UNIT) CAPS capsule Take 1 capsule (50,000 Units total) by mouth every 7 (seven) days. 4 capsule 0  ? warfarin (COUMADIN) 7.5 MG tablet TAKE 1 TO 2 TABLETS BY MOUTH DAILY AS DIRECTED BY COUMADIN CLINIC 50 tablet 0  ? ?No current facility-administered medications on file prior to visit.  ?  ?Allergies  ?Allergen Reactions  ? Penicillins Hives  ?  Has patient had a PCN reaction causing immediate rash, facial/tongue/throat swelling, SOB or lightheadedness with hypotension: No ?Has patient had a PCN reaction causing severe rash involving mucus membranes or skin necrosis: No ?Has patient had a PCN reaction that required hospitalization No ?Has patient had a PCN reaction occurring within the last 10 years: No ?If all of the above answers are "NO", then may proceed with Cephalosporin use.  ? ?Social History  ? ?Occupational History  ? Occupation: [employer]  ?Tobacco Use  ? Smoking status: Former  ?  Types: Cigarettes  ? Smokeless tobacco: Never  ? Tobacco comments:  ?  only smokes when she drinks alcohol - 1 pack per week  ?Vaping Use  ? Vaping Use: Never used  ?Substance and Sexual Activity  ? Alcohol use: Yes  ?  Alcohol/week: 4.0 - 6.0 standard drinks  ?  Types: 4 - 6 Standard drinks or equivalent per week  ?  Comment: mixed drinks 3-4 times/weeks  ? Drug use: No  ? Sexual activity: Not Currently  ? ?Family History  ?Problem Relation Age of Onset  ? Diabetes Maternal Grandmother   ? Breast cancer Maternal Grandmother   ?     breast cancer   ? Brain cancer Maternal Grandmother   ? Hypertension Mother   ? Obesity Mother   ? Heart disease Father   ? Obesity Father   ? Breast cancer Maternal Aunt 32  ? Breast cancer Maternal Aunt 88  ? ?Immunization History  ?Administered Date(s) Administered  ? Influenza Inj Mdck Quad Pf 12/23/2017  ? Influenza,inj,Quad PF,6+ Mos 03/24/2016, 04/04/2017, 03/02/2020, 03/08/2021  ? Influenza-Unspecified 01/04/2015  ? PFIZER(Purple Top)SARS-COV-2  Vaccination 07/18/2019, 08/08/2019  ? Pneumococcal Polysaccharide-23 07/30/2017  ?  ? ?Review of Systems: Negative except as noted in the HPI.  ? ?Objective: ?There were no vitals filed for this visit. ? ?KIERAN NACHTIGAL is a pleasant 46 y.o. female in NAD. AAO X 3. ? ?Vascular Examination: ?CFT <3 seconds b/l LE. Faintly palpable DP pulses b/l LE. Diminished PT pulse(s) b/l LE. Pedal hair absent. No pain with calf compression b/l. Lower extremity skin temperature gradient within normal limits. Nonpitting edema noted BLE. Evidence of chronic venous insufficiency b/l LE. No ischemia or gangrene noted b/l LE. No cyanosis or clubbing noted b/l LE. ? ?Dermatological Examination: ?No open wounds b/l LE. No interdigital macerations noted b/l LE. Toenails 1-5 b/l elongated, discolored, dystrophic, thickened, crumbly with subungual debris and tenderness to dorsal palpation. Hyperkeratotic lesion(s) bilateral heels and plantarlateral aspect of midfoot b/l.  No erythema, no edema, no drainage, no fluctuance. Evidence of chronic venous insufficiency b/l lower extremities. Hyperpigmentation consistent with findings of chronic venous insufficiency is present b/l lower extremities. Pedal skin noted to be dry b/l lower extremities. ? ?Neurological Examination: ?Pt has subjective symptoms of neuropathy. Protective sensation intact 5/5 intact bilaterally with 10g monofilament b/l. Vibratory sensation decreased b/l. ? ?Musculoskeletal Examination: ?Muscle strength 5/5 to all lower extremity muscle groups bilaterally. Pes planus deformity noted bilateral LE. Utilizes cane for ambulation assistance. ? ?Footwear Assessment: ?Does the patient wear appropriate shoes? Yes. ?Does the patient need inserts/orthotics? Yes. ? ? ?  Latest Ref Rng & Units 02/08/2021  ? 10:32 AM 11/08/2020  ? 11:33 AM  ?Hemoglobin A1C  ?Hemoglobin-A1c 4.8 - 5.6 % 5.6   6.2    ? ?Assessment: ?1. Pain due to onychomycosis of toenails of both feet   ?2. Callus of  foot   ?3. Xerosis cutis   ?4. Pes planus of both feet   ?5. Diabetic peripheral neuropathy associated with type 2 diabetes mellitus ([place])   ?6. Encounter for diabetic foot exam ([place])   ?  ?ADA Risk Categorization: ? ?Hig

## 2021-08-03 ENCOUNTER — Ambulatory Visit: Payer: 59

## 2021-08-03 DIAGNOSIS — Z5181 Encounter for therapeutic drug level monitoring: Secondary | ICD-10-CM | POA: Diagnosis not present

## 2021-08-03 DIAGNOSIS — Z7901 Long term (current) use of anticoagulants: Secondary | ICD-10-CM

## 2021-08-03 LAB — POCT INR: INR: 2.4 (ref 2.0–3.0)

## 2021-08-03 NOTE — Progress Notes (Signed)
Chief Complaint:   OBESITY Alexa [name] is here to discuss her progress with her obesity treatment plan along with follow-up of her obesity related diagnoses. Alexa [name] is on keeping a food journal and adhering to recommended goals of 1500-1700 calories and 100 grams of protein daily and states she is following her eating plan approximately 70% of the time. Alexa [name] states she is walking and dancing.   Today's visit was #: 80 Starting weight: 434 lbs Starting date: 06/22/2020 Today's weight: 395 lbs Today's date: 07/20/2021 Total lbs lost to date: 39 Total lbs lost since last in-office visit: 0  Interim History: Sanaz's last visit was 3 months ago. She has been dealing with the recent and tragic death of her godson. She hasn't been able to follow her plan, but instead she was mindful of her food choices. She struggles especially with eating breakfast.    Subjective:   1. Type 2 diabetes mellitus with other specified complication, without long-term current use of insulin (HCC) Alexa [name] has been off her Ozempic for 1 month. She continues to work on her diet, exercise, and weight loss.   2. Grief reaction Alexa [name] is grieving the loss of her godson and is trying to get back to a level of normality.     Assessment/Plan:   1. Type 2 diabetes mellitus with other specified complication, without long-term current use of insulin (HCC) We will refill Ozempic and metformin for 1 month. (Patient to restart at 1 mg and working on her way back up as tolerated).   - Semaglutide, 1 MG/DOSE, 4 MG/3ML SOPN; Inject 2 mg as directed once a week.  Dispense: 3 mL; Refill: 0 - metFORMIN (GLUCOPHAGE) 500 MG tablet; Take 1 tablet (500 mg total) by mouth 2 (two) times daily with a meal.  Dispense: 60 tablet; Refill: 0  2. Grief reaction Alexa [name] appears to be grieving  in a healthy way. Patient was offered support and encouragement and I will continue to monitor.   3. Obesity with current BMI 67.9 Alexa [name] is currently  in the action stage of change. As such, her goal is to continue with weight loss efforts. She has agreed to the Category 3 Plan.   Breakfast options were discussed.  Exercise goals: As is.   Behavioral modification strategies: increasing lean protein intake.  Alexa [name] has agreed to follow-up with our clinic in 4 weeks. She was informed of the importance of frequent follow-up visits to maximize her success with intensive lifestyle modifications for her multiple health conditions.   Objective:   Blood pressure 123/76, pulse 89, temperature 97.8 F (36.6 C), height '5\' 4"'$  (1.626 m), weight (!) 395 lb (179.2 kg), SpO2 95 %. Body mass index is 67.8 kg/m.  General: Cooperative, alert, well developed, in no acute distress. HEENT: Conjunctivae and lids unremarkable. Cardiovascular: Regular rhythm.  Lungs: Normal work of breathing. Neurologic: No focal deficits.   Lab Results  Component Value Date   CREATININE 0.78 02/08/2021   BUN 9 02/08/2021   NA 139 02/08/2021   K 4.6 02/08/2021   CL 98 02/08/2021   CO2 26 02/08/2021   Lab Results  Component Value Date   ALT 13 02/08/2021   AST 16 02/08/2021   ALKPHOS 54 02/08/2021   BILITOT 0.5 02/08/2021   Lab Results  Component Value Date   HGBA1C 5.6 02/08/2021   HGBA1C 6.2 (H) 11/08/2020   HGBA1C 6.0 (H) 06/22/2020   HGBA1C 5.8 (H) 03/02/2020   HGBA1C 6.1 (A) 02/19/2018   Lab  Results  Component Value Date   INSULIN 17.6 02/08/2021   INSULIN 3.9 11/08/2020   INSULIN 20.3 06/22/2020   Lab Results  Component Value Date   TSH 1.420 06/22/2020   Lab Results  Component Value Date   CHOL 160 02/08/2021   HDL 42 02/08/2021   LDLCALC 99 02/08/2021   LDLDIRECT 100 10/08/2015   TRIG 102 02/08/2021   CHOLHDL 3.9 03/02/2020   Lab Results  Component Value Date   VD25OH 42.6 02/08/2021   VD25OH 28.1 (L) 11/08/2020   VD25OH 15.8 (L) 06/22/2020   Lab Results  Component Value Date   WBC 7.4 06/22/2020   HGB 13.6 06/22/2020   HCT  40.7 06/22/2020   MCV 93 06/22/2020   PLT 327 06/22/2020   Lab Results  Component Value Date   IRON 38 (L) 10/08/2015   TIBC 416 10/08/2015   FERRITIN 27 10/08/2015   Attestation Statements:   Reviewed by clinician on day of visit: allergies, medications, problem list, medical history, surgical history, family history, social history, and previous encounter notes.   I, Trixie Dredge, am acting as transcriptionist for Dennard Nip, MD.  I have reviewed the above documentation for accuracy and completeness, and I agree with the above. -  Dennard Nip, MD

## 2021-08-03 NOTE — Patient Instructions (Signed)
continue taking 1.5 tablets daily except 1 tablet on Tuesday.   INR in 6 weeks. [facility]in [facility] [phone] ?

## 2021-08-17 ENCOUNTER — Ambulatory Visit (INDEPENDENT_AMBULATORY_CARE_PROVIDER_SITE_OTHER): Payer: 59 | Admitting: Family Medicine

## 2021-08-17 ENCOUNTER — Encounter (INDEPENDENT_AMBULATORY_CARE_PROVIDER_SITE_OTHER): Payer: Self-pay | Admitting: Family Medicine

## 2021-08-17 VITALS — BP 114/74 | HR 85 | Temp 98.2°F | Ht 64.0 in | Wt >= 6400 oz

## 2021-08-17 DIAGNOSIS — F3289 Other specified depressive episodes: Secondary | ICD-10-CM | POA: Diagnosis not present

## 2021-08-17 DIAGNOSIS — E669 Obesity, unspecified: Secondary | ICD-10-CM | POA: Diagnosis not present

## 2021-08-17 DIAGNOSIS — E1169 Type 2 diabetes mellitus with other specified complication: Secondary | ICD-10-CM

## 2021-08-17 DIAGNOSIS — Z6841 Body Mass Index (BMI) 40.0 and over, adult: Secondary | ICD-10-CM | POA: Diagnosis not present

## 2021-08-17 DIAGNOSIS — Z7984 Long term (current) use of oral hypoglycemic drugs: Secondary | ICD-10-CM

## 2021-08-17 MED ORDER — BUPROPION HCL ER (SR) 150 MG PO TB12: 150.0000 mg | ORAL_TABLET | Freq: Every day | ORAL | 0 refills | Status: DC

## 2021-08-17 MED ORDER — METFORMIN HCL 500 MG PO TABS: 500.0000 mg | ORAL_TABLET | Freq: Two times a day (BID) | ORAL | 0 refills | Status: DC

## 2021-08-17 MED ORDER — SEMAGLUTIDE (1 MG/DOSE) 4 MG/3ML ~~LOC~~ SOPN: 2.0000 mg | PEN_INJECTOR | SUBCUTANEOUS | 0 refills | Status: DC

## 2021-08-23 ENCOUNTER — Other Ambulatory Visit: Payer: Self-pay | Admitting: Cardiovascular Disease

## 2021-08-23 DIAGNOSIS — Z7901 Long term (current) use of anticoagulants: Secondary | ICD-10-CM

## 2021-08-23 DIAGNOSIS — I2699 Other pulmonary embolism without acute cor pulmonale: Secondary | ICD-10-CM

## 2021-08-29 NOTE — Progress Notes (Signed)
Chief Complaint:   OBESITY Alexa [name] is here to discuss her progress with her obesity treatment plan along with follow-up of her obesity related diagnoses. Alexa [name] is on the Category 3 Plan and states she is following her eating plan approximately 80% of the time. 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  Today's visit was #: 73 Starting weight: 434 lbs Starting date: 06/22/2020 Today's weight: 403 lbs Today's date: 08/17/2021 Total lbs lost to date: 31 Total lbs lost since last in-office visit: 0  Interim History: Alexa [name] has struggled more with following her plan. 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   Subjective:   1. Type 2 diabetes mellitus with other specified complication, without long-term current use of insulin (HCC) Alexa [name] is on medications, and she is working on her diet but she is struggling more with decreasing simple carbohydrates.   2. Other depression, with emotinal eating behavior Alexa [name] notes increased emotional eating behaviors recently. She is doing more comfort eating.  Assessment/Plan:   1. Type 2 diabetes mellitus with other specified complication, without long-term current use of insulin (HCC) We will refill metformin and Ozempic for 1 month. Good blood sugar control is important to decrease the likelihood of diabetic complications such as nephropathy, neuropathy, limb loss, blindness, coronary artery disease, and death. Intensive lifestyle modification including diet, exercise and weight loss are the first line of treatment for diabetes.   - metFORMIN (GLUCOPHAGE) 500 MG tablet; Take 1 tablet (500 mg total) by mouth 2 (two) times daily with a meal.  Dispense: 60 tablet; Refill: 0 - Semaglutide, 1 MG/DOSE, 4 MG/3ML SOPN; Inject 2 mg as directed once a week.  Dispense: 3 mL; Refill: 0  2. Other depression, with emotinal  eating behavior Alexa [name] agreed to start Wellbutrin SR 150 mg q AM with no refills. Behavior modification techniques were discussed today to help Alexa [name] deal with her emotional/non-hunger eating behaviors.  Orders and follow up as documented in patient record.   - buPROPion (WELLBUTRIN SR) 150 MG 12 hr tablet; Take 1 tablet (150 mg total) by mouth daily.  Dispense: 30 tablet; Refill: 0  3. Obesity, Current BMI 69.17 Alexa [name] is currently in the action stage of change. As such, her goal is to continue with weight loss efforts. She has agreed to the Category 3 Plan.   Exercise goals: As is.  Behavioral modification strategies: increasing lean protein intake.  Alexa [name] has agreed to follow-up with our clinic in 4 weeks. She was informed of the importance of frequent follow-up visits to maximize her success with intensive lifestyle modifications for her multiple health conditions.   Objective:   Blood pressure 114/74, pulse 85, temperature 98.2 F (36.8 C), height '5\' 4"'$  (1.626 m), weight (!) 403 lb (182.8 kg), SpO2 97 %. Body mass index is 69.17 kg/m.  General: Cooperative, alert, well developed, in no acute distress. HEENT: Conjunctivae and lids unremarkable. Cardiovascular: Regular rhythm.  Lungs: Normal work of breathing. Neurologic: No focal deficits.   Lab Results  Component Value Date   CREATININE 0.78 02/08/2021   BUN 9 02/08/2021   NA 139 02/08/2021   K 4.6 02/08/2021   CL 98 02/08/2021   CO2 26 02/08/2021   Lab Results  Component Value Date   ALT 13 02/08/2021   AST 16 02/08/2021   ALKPHOS 54 02/08/2021   BILITOT 0.5  02/08/2021   Lab Results  Component Value Date   HGBA1C 5.6 02/08/2021   HGBA1C 6.2 (H) 11/08/2020   HGBA1C 6.0 (H) 06/22/2020   HGBA1C 5.8 (H) 03/02/2020   HGBA1C 6.1 (A) 02/19/2018   Lab Results  Component Value Date   INSULIN 17.6 02/08/2021   INSULIN 3.9 11/08/2020   INSULIN 20.3 06/22/2020   Lab Results  Component Value Date   TSH 1.420  06/22/2020   Lab Results  Component Value Date   CHOL 160 02/08/2021   HDL 42 02/08/2021   LDLCALC 99 02/08/2021   LDLDIRECT 100 10/08/2015   TRIG 102 02/08/2021   CHOLHDL 3.9 03/02/2020   Lab Results  Component Value Date   VD25OH 42.6 02/08/2021   VD25OH 28.1 (L) 11/08/2020   VD25OH 15.8 (L) 06/22/2020   Lab Results  Component Value Date   WBC 7.4 06/22/2020   HGB 13.6 06/22/2020   HCT 40.7 06/22/2020   MCV 93 06/22/2020   PLT 327 06/22/2020   Lab Results  Component Value Date   IRON 38 (L) 10/08/2015   TIBC 416 10/08/2015   FERRITIN 27 10/08/2015   Attestation Statements:   Reviewed by clinician on day of visit: allergies, medications, problem list, medical history, surgical history, family history, social history, and previous encounter notes.   I, Trixie Dredge, am acting as transcriptionist for Dennard Nip, MD.  I have reviewed the above documentation for accuracy and completeness, and I agree with the above. -  Dennard Nip, MD

## 2021-09-18 ENCOUNTER — Ambulatory Visit (INDEPENDENT_AMBULATORY_CARE_PROVIDER_SITE_OTHER): Payer: 59 | Admitting: Family Medicine

## 2021-09-18 ENCOUNTER — Other Ambulatory Visit (INDEPENDENT_AMBULATORY_CARE_PROVIDER_SITE_OTHER): Payer: Self-pay | Admitting: Family Medicine

## 2021-09-18 ENCOUNTER — Encounter (INDEPENDENT_AMBULATORY_CARE_PROVIDER_SITE_OTHER): Payer: Self-pay

## 2021-09-18 DIAGNOSIS — F3289 Other specified depressive episodes: Secondary | ICD-10-CM

## 2021-09-20 ENCOUNTER — Ambulatory Visit (INDEPENDENT_AMBULATORY_CARE_PROVIDER_SITE_OTHER): Payer: 59 | Admitting: Family Medicine

## 2021-09-20 ENCOUNTER — Ambulatory Visit (INDEPENDENT_AMBULATORY_CARE_PROVIDER_SITE_OTHER): Payer: 59

## 2021-09-20 DIAGNOSIS — Z7901 Long term (current) use of anticoagulants: Secondary | ICD-10-CM | POA: Diagnosis not present

## 2021-09-20 DIAGNOSIS — Z5181 Encounter for therapeutic drug level monitoring: Secondary | ICD-10-CM | POA: Diagnosis not present

## 2021-09-20 LAB — POCT INR: INR: 3.9 — AB (ref 2.0–3.0)

## 2021-09-20 NOTE — Patient Instructions (Signed)
Description   Hold tomorrow's dose and then continue taking 1.5 tablets daily except 1 tablet on Tuesday. Recheck INR in 4 weeks.  [facility]in Clinic [phone]

## 2021-09-21 ENCOUNTER — Other Ambulatory Visit: Payer: Self-pay | Admitting: Family Medicine

## 2021-09-21 DIAGNOSIS — R600 Localized edema: Secondary | ICD-10-CM

## 2021-09-21 DIAGNOSIS — G5793 Unspecified mononeuropathy of bilateral lower limbs: Secondary | ICD-10-CM

## 2021-09-21 NOTE — Telephone Encounter (Signed)
I called to get her scheduled for  physical/refills.   Left voicemail to call and make an appt.

## 2021-09-22 ENCOUNTER — Other Ambulatory Visit: Payer: Self-pay | Admitting: Family Medicine

## 2021-09-22 DIAGNOSIS — R600 Localized edema: Secondary | ICD-10-CM

## 2021-09-22 NOTE — Telephone Encounter (Signed)
Requested Prescriptions  Pending Prescriptions Disp Refills  . potassium chloride (KLOR-CON M) 10 MEQ tablet [Pharmacy Med Name: POTASSIUM CL MICRO 10MEQ ER TABS] 60 tablet 2    Sig: TAKE 1 TABLET(10 MEQ) BY MOUTH TWICE DAILY     Endocrinology:  Minerals - Potassium Supplementation Passed - 09/22/2021  8:36 AM      Passed - K in normal range and within 360 days    Potassium  Date Value Ref Range Status  02/08/2021 4.6 3.5 - 5.2 mmol/L Final         Passed - Cr in normal range and within 360 days    Creatinine  Date Value Ref Range Status  05/24/2017 0.84 0.60 - 1.10 mg/dL Final   Creat  Date Value Ref Range Status  01/28/2016 0.90 0.50 - 1.10 mg/dL Final   Creatinine, Ser  Date Value Ref Range Status  02/08/2021 0.78 0.57 - 1.00 mg/dL Final   Creatinine, Urine  Date Value Ref Range Status  05/14/2016 144.43 mg/dL Final         Passed - Valid encounter within last 12 months    Recent Outpatient Visits          6 months ago Essential hypertension   Primary Care at [facility], MD   9 months ago Generalized anxiety disorder   Primary Care at [facility], MD   10 months ago Type 2 diabetes mellitus with other specified complication, without long-term current use of insulin [facility])   Primary Care at [facility], MD   1 year ago Flu vaccine need   [facility] at [facility] [place], NP   3 years ago Type 2 diabetes mellitus with complication, without long-term current use of insulin [facility])   Primary Care at [facility], Lilia Argue, MD

## 2021-10-06 ENCOUNTER — Other Ambulatory Visit: Payer: Self-pay | Admitting: Family Medicine

## 2021-10-06 ENCOUNTER — Other Ambulatory Visit (INDEPENDENT_AMBULATORY_CARE_PROVIDER_SITE_OTHER): Payer: Self-pay | Admitting: Family Medicine

## 2021-10-06 DIAGNOSIS — E559 Vitamin D deficiency, unspecified: Secondary | ICD-10-CM

## 2021-10-06 DIAGNOSIS — G5793 Unspecified mononeuropathy of bilateral lower limbs: Secondary | ICD-10-CM

## 2021-10-06 DIAGNOSIS — E1169 Type 2 diabetes mellitus with other specified complication: Secondary | ICD-10-CM

## 2021-10-06 DIAGNOSIS — R600 Localized edema: Secondary | ICD-10-CM

## 2021-10-11 ENCOUNTER — Ambulatory Visit (INDEPENDENT_AMBULATORY_CARE_PROVIDER_SITE_OTHER): Payer: 59 | Admitting: Family Medicine

## 2021-10-11 ENCOUNTER — Telehealth (INDEPENDENT_AMBULATORY_CARE_PROVIDER_SITE_OTHER): Payer: Self-pay | Admitting: *Deleted

## 2021-10-11 ENCOUNTER — Encounter (INDEPENDENT_AMBULATORY_CARE_PROVIDER_SITE_OTHER): Payer: Self-pay | Admitting: Family Medicine

## 2021-10-11 VITALS — BP 107/72 | HR 86 | Temp 98.3°F | Ht 64.0 in | Wt 392.0 lb

## 2021-10-11 DIAGNOSIS — E559 Vitamin D deficiency, unspecified: Secondary | ICD-10-CM | POA: Diagnosis not present

## 2021-10-11 DIAGNOSIS — E538 Deficiency of other specified B group vitamins: Secondary | ICD-10-CM | POA: Diagnosis not present

## 2021-10-11 DIAGNOSIS — Z7984 Long term (current) use of oral hypoglycemic drugs: Secondary | ICD-10-CM

## 2021-10-11 DIAGNOSIS — E1169 Type 2 diabetes mellitus with other specified complication: Secondary | ICD-10-CM | POA: Diagnosis not present

## 2021-10-11 DIAGNOSIS — F3289 Other specified depressive episodes: Secondary | ICD-10-CM

## 2021-10-11 DIAGNOSIS — Z6841 Body Mass Index (BMI) 40.0 and over, adult: Secondary | ICD-10-CM

## 2021-10-11 DIAGNOSIS — E119 Type 2 diabetes mellitus without complications: Secondary | ICD-10-CM | POA: Insufficient documentation

## 2021-10-11 DIAGNOSIS — E669 Obesity, unspecified: Secondary | ICD-10-CM

## 2021-10-11 DIAGNOSIS — Z7985 Long-term (current) use of injectable non-insulin antidiabetic drugs: Secondary | ICD-10-CM

## 2021-10-11 MED ORDER — VITAMIN D (ERGOCALCIFEROL) 1.25 MG (50000 UNIT) PO CAPS: 50000.0000 [IU] | ORAL_CAPSULE | ORAL | 0 refills | Status: DC

## 2021-10-11 MED ORDER — BUPROPION HCL ER (SR) 150 MG PO TB12: 150.0000 mg | ORAL_TABLET | Freq: Every day | ORAL | 0 refills | Status: DC

## 2021-10-11 MED ORDER — SEMAGLUTIDE (2 MG/DOSE) 8 MG/3ML ~~LOC~~ SOPN: 2.0000 mg | PEN_INJECTOR | SUBCUTANEOUS | 0 refills | Status: DC

## 2021-10-11 MED ORDER — METFORMIN HCL 500 MG PO TABS: 500.0000 mg | ORAL_TABLET | Freq: Two times a day (BID) | ORAL | 0 refills | Status: DC

## 2021-10-11 MED ORDER — SEMAGLUTIDE (1 MG/DOSE) 4 MG/3ML ~~LOC~~ SOPN: 2.0000 mg | PEN_INJECTOR | SUBCUTANEOUS | 0 refills | Status: DC

## 2021-10-11 NOTE — Telephone Encounter (Addendum)
Pharmacy called asking requesting to change the 1 mL Ozempic pen to 2 mg Ozempic pen since patient is taking 2 mg. Per Dr. Leafy Ro this would be ok. Rx was changed in chart note for today's visit to reflect the 2 mg Ozempic pen, please sign. It was verbally given to pharmacist during call, it is uploaded as NO PRINT since verbal was given.

## 2021-10-12 LAB — COMPREHENSIVE METABOLIC PANEL
ALT: 13 IU/L (ref 0–32)
AST: 18 IU/L (ref 0–40)
Albumin/Globulin Ratio: 1.3 (ref 1.2–2.2)
Albumin: 4 g/dL (ref 3.9–4.9)
Alkaline Phosphatase: 57 IU/L (ref 44–121)
BUN/Creatinine Ratio: 9 (ref 9–23)
BUN: 7 mg/dL (ref 6–24)
Bilirubin Total: 0.4 mg/dL (ref 0.0–1.2)
CO2: 25 mmol/L (ref 20–29)
Calcium: 8.8 mg/dL (ref 8.7–10.2)
Chloride: 98 mmol/L (ref 96–106)
Creatinine, Ser: 0.75 mg/dL (ref 0.57–1.00)
Globulin, Total: 3.2 g/dL (ref 1.5–4.5)
Glucose: 86 mg/dL (ref 70–99)
Potassium: 4.2 mmol/L (ref 3.5–5.2)
Sodium: 138 mmol/L (ref 134–144)
Total Protein: 7.2 g/dL (ref 6.0–8.5)
eGFR: 101 mL/min/{1.73_m2} (ref >59)

## 2021-10-12 LAB — LIPID PANEL WITH LDL/HDL RATIO
Cholesterol, Total: 159 mg/dL (ref 100–199)
HDL: 41 mg/dL (ref >39)
LDL Chol Calc (NIH): 96 mg/dL (ref 0–99)
LDL/HDL Ratio: 2.3 ratio (ref 0.0–3.2)
Triglycerides: 120 mg/dL (ref 0–149)
VLDL Cholesterol Cal: 22 mg/dL (ref 5–40)

## 2021-10-12 LAB — VITAMIN B12: Vitamin B-12: 107 pg/mL — ABNORMAL LOW (ref 232–1245)

## 2021-10-12 LAB — HEMOGLOBIN A1C
Est. average glucose Bld gHb Est-mCnc: 120 mg/dL
Hgb A1c MFr Bld: 5.8 % — ABNORMAL HIGH (ref 4.8–5.6)

## 2021-10-12 LAB — VITAMIN D 25 HYDROXY (VIT D DEFICIENCY, FRACTURES): Vit D, 25-Hydroxy: 23.5 ng/mL — ABNORMAL LOW (ref 30.0–100.0)

## 2021-10-12 LAB — INSULIN, RANDOM: INSULIN: 17.2 u[IU]/mL (ref 2.6–24.9)

## 2021-10-17 NOTE — Progress Notes (Signed)
Chief Complaint:   OBESITY Alexa [name] is here to discuss her progress with her obesity treatment plan along with follow-up of her obesity related diagnoses. Alexa [name] is on the Category 3 Plan and states she is following her eating plan approximately 80% of the time. Alexa [name] states she is doing 0 minutes 0 times per week.  Today's visit was #: 20 Starting weight: 434 lbs Starting date: 06/22/2020 Today's weight: 392 lbs Today's date: 10/11/2021 Total lbs lost to date: 42 Total lbs lost since last in-office visit: 11  Interim History: Tierrah continues to do well with weight loss.  She notes her hunger is controlled, but she is sometimes skipping some meals.  Subjective:   1. Type 2 diabetes mellitus with other specified complication, unspecified whether long term insulin use ([place]) Alexa [name] notes polyphagia has resolved.  She is tolerating Ozempic and metformin.  2. Vitamin D deficiency Alexa [name] is on vitamin D, and she is due for labs.  3. Vitamin B12 deficiency Alexa [name]'s last B12 level was very low.  She is on a B12 rich diet and she is due for labs.  4. Other depression, with emotinal eating behavior Alexa [name] is stable on her medications, and her mood is good.  She is doing better with decreasing emotional eating behaviors.  Assessment/Plan:   1. Type 2 diabetes mellitus with other specified complication, unspecified whether long term insulin use (HCC) We will check labs today. Alexa [name] will continue her medications, and we will refill metformin and Ozempic for 1 month.  - metFORMIN (GLUCOPHAGE) 500 MG tablet; Take 1 tablet (500 mg total) by mouth 2 (two) times daily with a meal.  Dispense: 60 tablet; Refill: 0 - Comprehensive metabolic panel - Hemoglobin A1c - Insulin, random - Lipid Panel With LDL/HDL Ratio - Semaglutide, 2 MG/DOSE, 8 MG/3ML SOPN; Inject 2 mg as directed once a week.  Dispense: 3 mL; Refill: 0  2. Vitamin D deficiency We will check labs today, and we will refill  prescription Vitamin D for 1 month. Alexa [name] will follow-up for routine testing of Vitamin D, at least 2-3 times per year to avoid over-replacement.  - Vitamin D, Ergocalciferol, (DRISDOL) 1.25 MG (50000 UNIT) CAPS capsule; Take 1 capsule (50,000 Units total) by mouth every 7 (seven) days.  Dispense: 4 capsule; Refill: 0 - VITAMIN D 25 Hydroxy (Vit-D Deficiency, Fractures)  3. Vitamin B12 deficiency The diagnosis was reviewed with the patient.  Labs today, and we will [facility] at [facility].  Orders and follow up as documented in patient record.  - Vitamin B12  4. Other depression, with emotinal eating behavior Alexa [name] will continue Wellbutrin SR 150 mg once daily, and we will refill for 1 month.  - buPROPion (WELLBUTRIN SR) 150 MG 12 hr tablet; Take 1 tablet (150 mg total) by mouth daily.  Dispense: 30 tablet; Refill: 0  5. Obesity, Current BMI 67.3 Alexa [name] is currently in the action stage of change. As such, her goal is to continue with weight loss efforts. She has agreed to the Category 3 Plan or keeping a food journal and adhering to recommended goals of 1500-1600 calories and 100+ grams of protein daily.   Behavioral modification strategies: increasing lean protein intake and no skipping meals.  Alexa [name] has agreed to follow-up with our clinic in 3 to 4 weeks. She was informed of the importance of frequent follow-up visits to maximize her success with intensive lifestyle modifications for her multiple health conditions.   Alexa [name] was informed we would discuss  her lab results at her next visit unless there is a critical issue that needs to be addressed sooner. Alexa [name] agreed to keep her next visit at the agreed upon time to discuss these results.  Objective:   Blood pressure 107/72, pulse 86, temperature 98.3 F (36.8 C), height '5\' 4"'$  (1.626 m), weight (!) 392 lb (177.8 kg), SpO2 92 %. Body mass index is 67.29 kg/m.  General: Cooperative, alert, well developed, in no acute  distress. HEENT: Conjunctivae and lids unremarkable. Cardiovascular: Regular rhythm.  Lungs: Normal work of breathing. Neurologic: No focal deficits.   Lab Results  Component Value Date   CREATININE 0.75 10/11/2021   BUN 7 10/11/2021   NA 138 10/11/2021   K 4.2 10/11/2021   CL 98 10/11/2021   CO2 25 10/11/2021   Lab Results  Component Value Date   ALT 13 10/11/2021   AST 18 10/11/2021   ALKPHOS 57 10/11/2021   BILITOT 0.4 10/11/2021   Lab Results  Component Value Date   HGBA1C 5.8 (H) 10/11/2021   HGBA1C 5.6 02/08/2021   HGBA1C 6.2 (H) 11/08/2020   HGBA1C 6.0 (H) 06/22/2020   HGBA1C 5.8 (H) 03/02/2020   Lab Results  Component Value Date   INSULIN 17.2 10/11/2021   INSULIN 17.6 02/08/2021   INSULIN 3.9 11/08/2020   INSULIN 20.3 06/22/2020   Lab Results  Component Value Date   TSH 1.420 06/22/2020   Lab Results  Component Value Date   CHOL 159 10/11/2021   HDL 41 10/11/2021   LDLCALC 96 10/11/2021   LDLDIRECT 100 10/08/2015   TRIG 120 10/11/2021   CHOLHDL 3.9 03/02/2020   Lab Results  Component Value Date   VD25OH 23.5 (L) 10/11/2021   VD25OH 42.6 02/08/2021   VD25OH 28.1 (L) 11/08/2020   Lab Results  Component Value Date   WBC 7.4 06/22/2020   HGB 13.6 06/22/2020   HCT 40.7 06/22/2020   MCV 93 06/22/2020   PLT 327 06/22/2020   Lab Results  Component Value Date   IRON 38 (L) 10/08/2015   TIBC 416 10/08/2015   FERRITIN 27 10/08/2015   Attestation Statements:   Reviewed by clinician on day of visit: allergies, medications, problem list, medical history, surgical history, family history, social history, and previous encounter notes.  Time spent on visit including pre-visit chart review and post-visit care and charting was 42 minutes.   I, Trixie Dredge, am acting as transcriptionist for Dennard Nip, MD.  I have reviewed the above documentation for accuracy and completeness, and I agree with the above. -  Dennard Nip, MD

## 2021-10-18 ENCOUNTER — Ambulatory Visit (INDEPENDENT_AMBULATORY_CARE_PROVIDER_SITE_OTHER): Payer: 59

## 2021-10-18 DIAGNOSIS — Z7901 Long term (current) use of anticoagulants: Secondary | ICD-10-CM

## 2021-10-18 LAB — POCT INR: INR: 2.1 (ref 2.0–3.0)

## 2021-10-18 NOTE — Patient Instructions (Signed)
Description   Continue taking 1.5 tablets daily except 1 tablet on Tuesday.  Recheck INR in 5 weeks.  [facility] [phone]

## 2021-10-25 ENCOUNTER — Ambulatory Visit: Payer: 59 | Admitting: Podiatry

## 2021-10-27 ENCOUNTER — Encounter: Payer: Self-pay | Admitting: Podiatry

## 2021-10-27 ENCOUNTER — Ambulatory Visit: Payer: 59 | Admitting: Podiatry

## 2021-10-27 DIAGNOSIS — L84 Corns and callosities: Secondary | ICD-10-CM | POA: Diagnosis not present

## 2021-10-27 DIAGNOSIS — E1142 Type 2 diabetes mellitus with diabetic polyneuropathy: Secondary | ICD-10-CM

## 2021-10-27 DIAGNOSIS — B351 Tinea unguium: Secondary | ICD-10-CM | POA: Diagnosis not present

## 2021-10-27 DIAGNOSIS — M79674 Pain in right toe(s): Secondary | ICD-10-CM | POA: Diagnosis not present

## 2021-10-27 DIAGNOSIS — M79675 Pain in left toe(s): Secondary | ICD-10-CM | POA: Diagnosis not present

## 2021-11-01 ENCOUNTER — Encounter (INDEPENDENT_AMBULATORY_CARE_PROVIDER_SITE_OTHER): Payer: Self-pay

## 2021-11-05 NOTE — Progress Notes (Signed)
  Subjective:  Patient ID: Alexa [name], female    DOB: [birth year],  MRN: [id number]  Alexa [name] presents to clinic today for at risk foot care with history of diabetic neuropathy and callus(es) b/l lower extremities and painful thick toenails that are difficult to trim. Painful toenails interfere with ambulation. Aggravating factors include wearing enclosed shoe gear. Pain is relieved with periodic professional debridement. Painful calluses are aggravated when weightbearing with and without shoegear. Pain is relieved with periodic professional debridement.  Last A1c was 5.2%. Patient does not monitor blood glucose daily.  New problem(s): None.   PCP is Dorna Mai, MD , and last visit was  March 08, 2021  Allergies  Allergen Reactions   Penicillins Hives    Has patient had a PCN reaction causing immediate rash, facial/tongue/throat swelling, SOB or lightheadedness with hypotension: No Has patient had a PCN reaction causing severe rash involving mucus membranes or skin necrosis: No Has patient had a PCN reaction that required hospitalization No Has patient had a PCN reaction occurring within the last 10 years: No If all of the above answers are "NO", then may proceed with Cephalosporin use.    Review of Systems: Negative except as noted in the HPI.  Objective: No changes noted in today's physical examination. Alexa [name] is a pleasant 46 y.o. female in NAD. AAO X 3.  Vascular Examination: CFT <3 seconds b/l LE. Faintly palpable DP pulses b/l LE. Diminished PT pulse(s) b/l LE. Pedal hair absent. No pain with calf compression b/l. Lower extremity skin temperature gradient within normal limits. Nonpitting edema noted BLE. Evidence of chronic venous insufficiency b/l LE. No ischemia or gangrene noted b/l LE. No cyanosis or clubbing noted b/l LE.  Dermatological Examination: No open wounds b/l LE. No interdigital macerations noted b/l LE. Toenails 1-5 b/l elongated,  discolored, dystrophic, thickened, crumbly with subungual debris and tenderness to dorsal palpation. Hyperkeratotic lesion(s) bilateral heels and plantarlateral aspect of midfoot b/l.  No erythema, no edema, no drainage, no fluctuance. Evidence of chronic venous insufficiency b/l lower extremities. Hyperpigmentation consistent with findings of chronic venous insufficiency is present b/l lower extremities. Pedal skin noted to be dry b/l lower extremities.  Neurological Examination: Pt has subjective symptoms of neuropathy. Protective sensation intact 5/5 intact bilaterally with 10g monofilament b/l. Vibratory sensation decreased b/l.  Musculoskeletal Examination: Muscle strength 5/5 to all lower extremity muscle groups bilaterally. Pes planus deformity noted bilateral LE. Utilizes cane for ambulation assistance.     Latest Ref Rng & Units 10/11/2021   12:47 PM 02/08/2021   10:32 AM 11/08/2020   11:33 AM  Hemoglobin A1C  Hemoglobin-A1c 4.8 - 5.6 % 5.8  5.6  6.2    Assessment/Plan: 1. Pain due to onychomycosis of toenails of both feet   2. Callus of foot   3. Diabetic peripheral neuropathy associated with type 2 diabetes mellitus ([place])   -Examined patient. -Encouraged patient to apply moisturizer to feet once daily. -Continue foot and shoe inspections daily. Monitor blood glucose per PCP/Endocrinologist's recommendations. -Patient to continue soft, supportive shoe gear daily. -Toenails 1-5 b/l were debrided in length and girth with sterile nail nippers and dremel without iatrogenic bleeding.  -Callus(es) bilateral heels and plantarlateral aspect of midfoot b/l pared utilizing mandrel sander without complication or incident. Total number debrided =4. -Patient/POA to call should there be question/concern in the interim.   Return in about 3 months (around 01/27/2022).  Marzetta Board, DPM

## 2021-11-06 ENCOUNTER — Encounter: Payer: Self-pay | Admitting: Family Medicine

## 2021-11-06 ENCOUNTER — Ambulatory Visit (INDEPENDENT_AMBULATORY_CARE_PROVIDER_SITE_OTHER): Payer: 59 | Admitting: Family Medicine

## 2021-11-06 VITALS — BP 121/79 | HR 68 | Temp 97.7°F | Resp 16 | Ht 64.0 in | Wt 384.0 lb

## 2021-11-06 DIAGNOSIS — Z6841 Body Mass Index (BMI) 40.0 and over, adult: Secondary | ICD-10-CM | POA: Diagnosis not present

## 2021-11-06 DIAGNOSIS — Z130 Encounter for screening for diseases of the blood and blood-forming organs and certain disorders involving the immune mechanism: Secondary | ICD-10-CM

## 2021-11-06 DIAGNOSIS — Z0000 Encounter for general adult medical examination without abnormal findings: Secondary | ICD-10-CM

## 2021-11-06 DIAGNOSIS — Z13220 Encounter for screening for lipoid disorders: Secondary | ICD-10-CM

## 2021-11-06 NOTE — Progress Notes (Unsigned)
Patient is here for her complete physical examination  Patient has no new concerns today

## 2021-11-07 LAB — CMP14+EGFR
ALT: 12 IU/L (ref 0–32)
AST: 17 IU/L (ref 0–40)
Albumin/Globulin Ratio: 1.3 (ref 1.2–2.2)
Albumin: 4.1 g/dL (ref 3.9–4.9)
Alkaline Phosphatase: 54 IU/L (ref 44–121)
BUN/Creatinine Ratio: 13 (ref 9–23)
BUN: 11 mg/dL (ref 6–24)
Bilirubin Total: 0.4 mg/dL (ref 0.0–1.2)
CO2: 22 mmol/L (ref 20–29)
Calcium: 9.2 mg/dL (ref 8.7–10.2)
Chloride: 99 mmol/L (ref 96–106)
Creatinine, Ser: 0.87 mg/dL (ref 0.57–1.00)
Globulin, Total: 3.2 g/dL (ref 1.5–4.5)
Glucose: 86 mg/dL (ref 70–99)
Potassium: 4.6 mmol/L (ref 3.5–5.2)
Sodium: 138 mmol/L (ref 134–144)
Total Protein: 7.3 g/dL (ref 6.0–8.5)
eGFR: 85 mL/min/{1.73_m2} (ref >59)

## 2021-11-07 LAB — CBC WITH DIFFERENTIAL/PLATELET
Basophils Absolute: 0 10*3/uL (ref 0.0–0.2)
Basos: 0 %
EOS (ABSOLUTE): 0 10*3/uL (ref 0.0–0.4)
Eos: 0 %
Hematocrit: 35.2 % (ref 34.0–46.6)
Hemoglobin: 12.5 g/dL (ref 11.1–15.9)
Immature Grans (Abs): 0 10*3/uL (ref 0.0–0.1)
Immature Granulocytes: 0 %
Lymphocytes Absolute: 1.9 10*3/uL (ref 0.7–3.1)
Lymphs: 30 %
MCH: 34.7 pg — ABNORMAL HIGH (ref 26.6–33.0)
MCHC: 35.5 g/dL (ref 31.5–35.7)
MCV: 98 fL — ABNORMAL HIGH (ref 79–97)
Monocytes Absolute: 0.6 10*3/uL (ref 0.1–0.9)
Monocytes: 10 %
Neutrophils Absolute: 3.9 10*3/uL (ref 1.4–7.0)
Neutrophils: 60 %
Platelets: 372 10*3/uL (ref 150–450)
RBC: 3.6 x10E6/uL — ABNORMAL LOW (ref 3.77–5.28)
RDW: 12.8 % (ref 11.7–15.4)
WBC: 6.5 10*3/uL (ref 3.4–10.8)

## 2021-11-07 LAB — LIPID PANEL
Chol/HDL Ratio: 3.8 ratio (ref 0.0–4.4)
Cholesterol, Total: 150 mg/dL (ref 100–199)
HDL: 39 mg/dL — ABNORMAL LOW (ref >39)
LDL Chol Calc (NIH): 93 mg/dL (ref 0–99)
Triglycerides: 94 mg/dL (ref 0–149)
VLDL Cholesterol Cal: 18 mg/dL (ref 5–40)

## 2021-11-07 LAB — TSH: TSH: 0.964 u[IU]/mL (ref 0.450–4.500)

## 2021-11-08 ENCOUNTER — Ambulatory Visit (INDEPENDENT_AMBULATORY_CARE_PROVIDER_SITE_OTHER): Payer: 59 | Admitting: Family Medicine

## 2021-11-08 ENCOUNTER — Encounter: Payer: Self-pay | Admitting: Family Medicine

## 2021-11-08 ENCOUNTER — Other Ambulatory Visit (INDEPENDENT_AMBULATORY_CARE_PROVIDER_SITE_OTHER): Payer: Self-pay | Admitting: Family Medicine

## 2021-11-08 ENCOUNTER — Encounter (INDEPENDENT_AMBULATORY_CARE_PROVIDER_SITE_OTHER): Payer: Self-pay | Admitting: Family Medicine

## 2021-11-08 VITALS — BP 108/73 | HR 88 | Temp 98.0°F | Ht 64.0 in | Wt 378.0 lb

## 2021-11-08 DIAGNOSIS — Z6841 Body Mass Index (BMI) 40.0 and over, adult: Secondary | ICD-10-CM | POA: Diagnosis not present

## 2021-11-08 DIAGNOSIS — E1169 Type 2 diabetes mellitus with other specified complication: Secondary | ICD-10-CM

## 2021-11-08 DIAGNOSIS — E538 Deficiency of other specified B group vitamins: Secondary | ICD-10-CM | POA: Diagnosis not present

## 2021-11-08 DIAGNOSIS — E66813 Obesity, class 3: Secondary | ICD-10-CM

## 2021-11-08 DIAGNOSIS — Z7985 Long-term (current) use of injectable non-insulin antidiabetic drugs: Secondary | ICD-10-CM

## 2021-11-08 DIAGNOSIS — E669 Obesity, unspecified: Secondary | ICD-10-CM | POA: Diagnosis not present

## 2021-11-08 DIAGNOSIS — Z7984 Long term (current) use of oral hypoglycemic drugs: Secondary | ICD-10-CM

## 2021-11-08 MED ORDER — CYANOCOBALAMIN 500 MCG PO TABS
ORAL_TABLET | ORAL | Status: DC
Start: 2021-11-08 — End: 2021-12-12

## 2021-11-08 MED ORDER — SEMAGLUTIDE (2 MG/DOSE) 8 MG/3ML ~~LOC~~ SOPN: 2.0000 mg | PEN_INJECTOR | SUBCUTANEOUS | 0 refills | Status: DC

## 2021-11-08 MED ORDER — METFORMIN HCL 500 MG PO TABS: 500.0000 mg | ORAL_TABLET | Freq: Two times a day (BID) | ORAL | 0 refills | Status: DC

## 2021-11-08 NOTE — Progress Notes (Signed)
Established Patient Office Visit  Subjective    Patient ID: CAMELIA STELZNER, female    DOB: 1975-09-17  Age: 46 y.o. MRN: [id number]  CC:  Chief Complaint  Patient presents with   Annual Exam    HPI LUCILLIA CORSON presents for routine annual exam. Patient denies acute complaints or concerns.    Outpatient Encounter Medications as of 11/06/2021  Medication Sig   acetaminophen (TYLENOL) 500 MG tablet Take 1,000 mg by mouth daily as needed for mild pain.   blood glucose meter kit and supplies KIT Use up to four times daily as directed.   buPROPion (WELLBUTRIN SR) 150 MG 12 hr tablet Take 1 tablet (150 mg total) by mouth daily.   cetirizine (ZYRTEC) 10 MG tablet Take 1 tablet (10 mg total) by mouth daily.   furosemide (LASIX) 40 MG tablet TAKE 1 TABLET(40 MG) BY MOUTH TWICE DAILY   gabapentin (NEURONTIN) 300 MG capsule TAKE 1 CAPSULE(300 MG) BY MOUTH TWICE DAILY   glucose blood test strip ascensia  Meter/strips preferred Test 3x daily Use as instructed Dx.dm   Lancets (ONETOUCH ULTRASOFT) lancets Test daily Use as instructed dm2   metFORMIN (GLUCOPHAGE) 500 MG tablet Take 1 tablet (500 mg total) by mouth 2 (two) times daily with a meal.   Misc. Devices MISC Bariatric size shower chair with handle 423 lbs, BMI 72  Dx. Pulmonary HTN, DM2 with neuropathy, morbid obesity   potassium chloride (KLOR-CON M) 10 MEQ tablet TAKE 1 TABLET(10 MEQ) BY MOUTH TWICE DAILY   Semaglutide, 2 MG/DOSE, 8 MG/3ML SOPN Inject 2 mg as directed once a week.   vitamin B-12 (CYANOCOBALAMIN) 500 MCG tablet [phone] mcg once daily   Vitamin D, Ergocalciferol, (DRISDOL) 1.25 MG (50000 UNIT) CAPS capsule Take 1 capsule (50,000 Units total) by mouth every 7 (seven) days.   warfarin (COUMADIN) 7.5 MG tablet TAKE 1 TO 2 TABLETS BY MOUTH DAILY AS DIRECTED BY COUMADIN CLINIC   No facility-administered encounter medications on file as of 11/06/2021.    Past Medical History:  Diagnosis Date   Anemia    Back pain     Bilateral swelling of feet    CHF (congestive heart failure) (HCC)    Diabetes mellitus without complication ([place])    Phreesia 03/02/2020   DM2 (diabetes mellitus, type 2) (HCC)    Hypertension    Joint pain    Nocturnal hypoxia    Obstructive sleep apnea syndrome, moderate    Pulmonary embolism ([place]) 05/14/2016   submassive, on chronic anticoagulation   Pulmonary hypertension ([place])    Vitamin D deficiency     Past Surgical History:  Procedure Laterality Date   CESAREAN SECTION      Family History  Problem Relation Age of Onset   Diabetes Maternal Grandmother    Breast cancer Maternal Grandmother        breast cancer    Brain cancer Maternal Grandmother    Hypertension Mother    Obesity Mother    Heart disease Father    Obesity Father    Breast cancer Maternal Aunt 75   Breast cancer Maternal Aunt 50    Social History   Socioeconomic History   Marital status: Divorced    Spouse name: n/a   Number of children: 2   Years of education: Not on file   Highest education level: Not on file  Occupational History   Occupation: [employer]  Tobacco Use   Smoking status: Former    Types: Cigarettes  Smokeless tobacco: Never   Tobacco comments:    only smokes when she drinks alcohol - 1 pack per week  Vaping Use   Vaping Use: Never used  Substance and Sexual Activity   Alcohol use: Yes    Alcohol/week: 4.0 - 6.0 standard drinks of alcohol    Types: 4 - 6 Standard drinks or equivalent per week    Comment: mixed drinks 3-4 times/weeks   Drug use: No   Sexual activity: Not Currently  Other Topics Concern   Not on file  Social History Narrative   Her children live with her.   Ex-husband lives locally.   Social Determinants of Health   Financial Resource Strain: Not on file  Food Insecurity: Not on file  Transportation Needs: Not on file  Physical Activity: Not on file  Stress: Not on file  Social Connections: Not on file  Intimate Partner Violence:  Not on file    Review of Systems  All other systems reviewed and are negative.       Objective    BP 121/79   Pulse 68   Temp 97.7 F (36.5 C) (Oral)   Resp 16   Ht _0  (1.626 m)   Wt (!) 384 lb (174.2 kg)   SpO2 95%   BMI 65.91 kg/m   Physical Exam Vitals and nursing note reviewed.  Constitutional:      General: She is not in acute distress.    Appearance: She is obese.  HENT:     Head: Normocephalic and atraumatic.     Right Ear: Tympanic membrane, ear canal and external ear normal.     Left Ear: Tympanic membrane, ear canal and external ear normal.     Nose: Nose normal.     Mouth/Throat:     Mouth: Mucous membranes are moist.     Pharynx: Oropharynx is clear.  Eyes:     Conjunctiva/sclera: Conjunctivae normal.     Pupils: Pupils are equal, round, and reactive to light.  Neck:     Thyroid: No thyromegaly.  Cardiovascular:     Rate and Rhythm: Normal rate and regular rhythm.     Heart sounds: Normal heart sounds. No murmur heard. Pulmonary:     Effort: Pulmonary effort is normal. No respiratory distress.     Breath sounds: Normal breath sounds.  Abdominal:     General: There is no distension.     Palpations: Abdomen is soft. There is no mass.     Tenderness: There is no abdominal tenderness.  Musculoskeletal:        General: Normal range of motion.     Cervical back: Normal range of motion and neck supple.     Comments: Utilizing cane for stablility  Skin:    General: Skin is warm and dry.  Neurological:     General: No focal deficit present.     Mental Status: She is alert and oriented to person, place, and time.  Psychiatric:        Mood and Affect: Mood normal.        Behavior: Behavior normal.         Assessment & Plan:   Problem List Items Addressed This Visit   None Visit Diagnoses     Annual physical exam    -  Primary   Relevant Orders   CMP14+EGFR (Completed)   Screening for deficiency anemia       Relevant Orders   CBC with  Differential (Completed)  Screening for lipid disorders       Relevant Orders   Lipid Panel (Completed)   Screening for endocrine/metabolic/immunity disorders       Relevant Orders   TSH (Completed)       No follow-ups on file.   Becky Sax, MD

## 2021-11-15 NOTE — Progress Notes (Signed)
Chief Complaint:   OBESITY Alexa [name] is here to discuss her progress with her obesity treatment plan along with follow-up of her obesity related diagnoses. Alexa [name] is on the Category 3 Plan or keeping a food journal and adhering to recommended goals of 1500-1600 calories and 100+ grams of protein daily and states she is following her eating plan approximately 85% of the time. Alexa [name] states she is walking, dancing, and doing sit exercise for 15-30 minutes 3 times per week.  Today's visit was #: 21 Starting weight: 434 lbs Starting date: 06/22/2020 Today's weight: 378 lbs Today's date: 11/08/2021 Total lbs lost to date: 56 Total lbs lost since last in-office visit: 14  Interim History: Alexa [name] continues to do well with weight loss. She is trying to increase activity. She is decreasing PM snacking and her hunger is mostly controlled.   Subjective:   1. Type 2 diabetes mellitus with other specified complication, unspecified whether long term insulin use (HCC) Alexa [name]'s recent A1c is well controlled at 5.8 with her diet, weight loss, and medications.   2. B12 deficiency Alexa [name] is stable on B12, with no side effects noted. She requests a refill today.  Assessment/Plan:   1. Type 2 diabetes mellitus with other specified complication, unspecified whether long term insulin use (HCC) Alexa [name] will continue her medications, and we will refill metformin and Ozempic for 1 month.  - metFORMIN (GLUCOPHAGE) 500 MG tablet; Take 1 tablet (500 mg total) by mouth 2 (two) times daily with a meal.  Dispense: 60 tablet; Refill: 0 - Semaglutide, 2 MG/DOSE, 8 MG/3ML SOPN; Inject 2 mg as directed once a week.  Dispense: 3 mL; Refill: 0  2. B12 deficiency We will refill B12 supplement for 1 month. [name] will continue her B12 rich diet.  - cyanocobalamin (VITAMIN B12) 500 MCG tablet; [phone] mcg once daily  3. Obesity, Current BMI 65.0 Stepahnie is currently in the action stage of change. As such, her goal is  to continue with weight loss efforts. She has agreed to the keeping a food journal and adhering to recommended goals of 1500-1600 calories and 100+ grams of protein daily.   Exercise goals: As is.   Behavioral modification strategies: increasing lean protein intake.  Alexa [name] has agreed to follow-up with our clinic in 3 to 4 weeks. She was informed of the importance of frequent follow-up visits to maximize her success with intensive lifestyle modifications for her multiple health conditions.   Objective:   Blood pressure 108/73, pulse 88, temperature 98 F (36.7 C), height '5\' 4"'$  (1.626 m), weight (!) 378 lb (171.5 kg), SpO2 95 %. Body mass index is 64.88 kg/m.  General: Cooperative, alert, well developed, in no acute distress. HEENT: Conjunctivae and lids unremarkable. Cardiovascular: Regular rhythm.  Lungs: Normal work of breathing. Neurologic: No focal deficits.   Lab Results  Component Value Date   CREATININE 0.87 11/06/2021   BUN 11 11/06/2021   NA 138 11/06/2021   K 4.6 11/06/2021   CL 99 11/06/2021   CO2 22 11/06/2021   Lab Results  Component Value Date   ALT 12 11/06/2021   AST 17 11/06/2021   ALKPHOS 54 11/06/2021   BILITOT 0.4 11/06/2021   Lab Results  Component Value Date   HGBA1C 5.8 (H) 10/11/2021   HGBA1C 5.6 02/08/2021   HGBA1C 6.2 (H) 11/08/2020   HGBA1C 6.0 (H) 06/22/2020   HGBA1C 5.8 (H) 03/02/2020   Lab Results  Component Value Date   INSULIN 17.2 10/11/2021  INSULIN 17.6 02/08/2021   INSULIN 3.9 11/08/2020   INSULIN 20.3 06/22/2020   Lab Results  Component Value Date   TSH 0.964 11/06/2021   Lab Results  Component Value Date   CHOL 150 11/06/2021   HDL 39 (L) 11/06/2021   LDLCALC 93 11/06/2021   LDLDIRECT 100 10/08/2015   TRIG 94 11/06/2021   CHOLHDL 3.8 11/06/2021   Lab Results  Component Value Date   VD25OH 23.5 (L) 10/11/2021   VD25OH 42.6 02/08/2021   VD25OH 28.1 (L) 11/08/2020   Lab Results  Component Value Date   WBC 6.5  11/06/2021   HGB 12.5 11/06/2021   HCT 35.2 11/06/2021   MCV 98 (H) 11/06/2021   PLT 372 11/06/2021   Lab Results  Component Value Date   IRON 38 (L) 10/08/2015   TIBC 416 10/08/2015   FERRITIN 27 10/08/2015   Attestation Statements:   Reviewed by clinician on day of visit: allergies, medications, problem list, medical history, surgical history, family history, social history, and previous encounter notes.   I, Trixie Dredge, am acting as transcriptionist for Dennard Nip, MD.  I have reviewed the above documentation for accuracy and completeness, and I agree with the above. -  Dennard Nip, MD

## 2021-11-16 ENCOUNTER — Other Ambulatory Visit (INDEPENDENT_AMBULATORY_CARE_PROVIDER_SITE_OTHER): Payer: Self-pay | Admitting: Family Medicine

## 2021-11-16 DIAGNOSIS — E559 Vitamin D deficiency, unspecified: Secondary | ICD-10-CM

## 2021-11-22 ENCOUNTER — Ambulatory Visit: Payer: 59

## 2021-11-28 ENCOUNTER — Other Ambulatory Visit (HOSPITAL_COMMUNITY): Payer: Self-pay

## 2021-11-28 ENCOUNTER — Other Ambulatory Visit (INDEPENDENT_AMBULATORY_CARE_PROVIDER_SITE_OTHER): Payer: Self-pay | Admitting: Family Medicine

## 2021-11-28 ENCOUNTER — Other Ambulatory Visit: Payer: Self-pay | Admitting: Cardiovascular Disease

## 2021-11-28 DIAGNOSIS — E1169 Type 2 diabetes mellitus with other specified complication: Secondary | ICD-10-CM

## 2021-11-28 DIAGNOSIS — E559 Vitamin D deficiency, unspecified: Secondary | ICD-10-CM

## 2021-11-28 DIAGNOSIS — E538 Deficiency of other specified B group vitamins: Secondary | ICD-10-CM

## 2021-11-28 DIAGNOSIS — I2699 Other pulmonary embolism without acute cor pulmonale: Secondary | ICD-10-CM

## 2021-11-28 DIAGNOSIS — Z7901 Long term (current) use of anticoagulants: Secondary | ICD-10-CM

## 2021-11-28 MED ORDER — WARFARIN SODIUM 7.5 MG PO TABS
ORAL_TABLET | ORAL | 0 refills | Status: DC
  Filled 2021-11-28: qty 45, 34d supply, fill #0

## 2021-11-28 NOTE — Telephone Encounter (Signed)
Pt missed last appt on 8/31, r/s appt for 12/04/21.  Will send in refill to get pt to upcoming appt, must keep OV for future refills.

## 2021-11-29 ENCOUNTER — Other Ambulatory Visit (HOSPITAL_COMMUNITY): Payer: Self-pay

## 2021-11-30 ENCOUNTER — Other Ambulatory Visit (HOSPITAL_COMMUNITY): Payer: Self-pay

## 2021-11-30 ENCOUNTER — Telehealth: Payer: Self-pay

## 2021-11-30 NOTE — Telephone Encounter (Signed)
Received call from pt stating she has not taken her Warfarin since Saturday (11/25/21). Instructed pt to take 3 tablets today and 2 tablets tomorrow and then resume regular dosing. Educated the pt on the importance of coming to anticoagulation appt on Monday 12/04/21. Pt verbalized understanding.

## 2021-12-04 ENCOUNTER — Telehealth: Payer: Self-pay

## 2021-12-04 ENCOUNTER — Ambulatory Visit: Payer: 59

## 2021-12-04 NOTE — Telephone Encounter (Signed)
Lpmtcb and reschedule INR appointment

## 2021-12-08 ENCOUNTER — Other Ambulatory Visit (HOSPITAL_COMMUNITY): Payer: Self-pay

## 2021-12-08 ENCOUNTER — Ambulatory Visit: Payer: Commercial Managed Care - HMO | Attending: Internal Medicine

## 2021-12-08 DIAGNOSIS — Z7901 Long term (current) use of anticoagulants: Secondary | ICD-10-CM | POA: Diagnosis not present

## 2021-12-08 DIAGNOSIS — I7410 Embolism and thrombosis of unspecified parts of aorta: Secondary | ICD-10-CM

## 2021-12-08 LAB — POCT INR: INR: 1.8 — AB (ref 2.0–3.0)

## 2021-12-08 NOTE — Patient Instructions (Signed)
Description   Take 2 tablets today and then continue taking 1.5 tablets daily except 1 tablet on Tuesday.  Recheck INR in 4 weeks.  [facility] [phone]

## 2021-12-12 ENCOUNTER — Ambulatory Visit (INDEPENDENT_AMBULATORY_CARE_PROVIDER_SITE_OTHER): Payer: Commercial Managed Care - HMO | Admitting: Family Medicine

## 2021-12-12 ENCOUNTER — Encounter (INDEPENDENT_AMBULATORY_CARE_PROVIDER_SITE_OTHER): Payer: Self-pay | Admitting: Family Medicine

## 2021-12-12 ENCOUNTER — Other Ambulatory Visit (HOSPITAL_COMMUNITY): Payer: Self-pay

## 2021-12-12 VITALS — BP 119/78 | HR 70 | Temp 98.3°F | Ht 64.0 in | Wt 379.0 lb

## 2021-12-12 DIAGNOSIS — Z7984 Long term (current) use of oral hypoglycemic drugs: Secondary | ICD-10-CM

## 2021-12-12 DIAGNOSIS — F3289 Other specified depressive episodes: Secondary | ICD-10-CM

## 2021-12-12 DIAGNOSIS — E559 Vitamin D deficiency, unspecified: Secondary | ICD-10-CM

## 2021-12-12 DIAGNOSIS — E669 Obesity, unspecified: Secondary | ICD-10-CM

## 2021-12-12 DIAGNOSIS — E1169 Type 2 diabetes mellitus with other specified complication: Secondary | ICD-10-CM

## 2021-12-12 DIAGNOSIS — E538 Deficiency of other specified B group vitamins: Secondary | ICD-10-CM

## 2021-12-12 DIAGNOSIS — Z6841 Body Mass Index (BMI) 40.0 and over, adult: Secondary | ICD-10-CM

## 2021-12-12 DIAGNOSIS — F32A Depression, unspecified: Secondary | ICD-10-CM | POA: Insufficient documentation

## 2021-12-12 MED ORDER — SEMAGLUTIDE (2 MG/DOSE) 8 MG/3ML ~~LOC~~ SOPN
2.0000 mg | PEN_INJECTOR | SUBCUTANEOUS | 0 refills | Status: DC
  Filled 2021-12-12 – 2022-01-15 (×2): qty 3, 28d supply, fill #0

## 2021-12-12 MED ORDER — METFORMIN HCL 500 MG PO TABS
500.0000 mg | ORAL_TABLET | Freq: Two times a day (BID) | ORAL | 0 refills | Status: DC
  Filled 2021-12-12: qty 60, 30d supply, fill #0

## 2021-12-12 MED ORDER — CYANOCOBALAMIN 500 MCG PO TABS: ORAL_TABLET | ORAL | Status: DC

## 2021-12-12 MED ORDER — VITAMIN D (ERGOCALCIFEROL) 1.25 MG (50000 UNIT) PO CAPS
50000.0000 [IU] | ORAL_CAPSULE | ORAL | 0 refills | Status: DC
  Filled 2021-12-12: qty 4, 28d supply, fill #0

## 2021-12-12 MED ORDER — BUPROPION HCL ER (SR) 150 MG PO TB12
150.0000 mg | ORAL_TABLET | Freq: Every day | ORAL | 0 refills | Status: DC
  Filled 2021-12-12: qty 30, 30d supply, fill #0

## 2021-12-14 ENCOUNTER — Other Ambulatory Visit (INDEPENDENT_AMBULATORY_CARE_PROVIDER_SITE_OTHER): Payer: Self-pay | Admitting: Family Medicine

## 2021-12-14 ENCOUNTER — Other Ambulatory Visit (HOSPITAL_COMMUNITY): Payer: Self-pay

## 2021-12-14 DIAGNOSIS — E1169 Type 2 diabetes mellitus with other specified complication: Secondary | ICD-10-CM

## 2021-12-19 NOTE — Progress Notes (Signed)
Chief Complaint:   OBESITY Alexa [name] is here to discuss her progress with her obesity treatment plan along with follow-up of her obesity related diagnoses. Alexa [name] is on keeping a food journal and adhering to recommended goals of 1500-1600 calories and 100+ grams of protein daily and states she is following her eating plan approximately 80% of the time. Alexa [name] states she is dancing and walking for 15 minutes 7 times per week.  Today's visit was #: 22 Starting weight: 434 lbs Starting date: 06/22/2020 Today's weight: 379 lbs Today's date: 12/12/2021 Total lbs lost to date: 55 Total lbs lost since last in-office visit: 0  Interim History: Alexa [name] has been dealing with car problems and transportation has been a problem. She hasn't been able to meal plan as closely, but she has done well with minimizing weight gain.  Subjective:   1. Vitamin D deficiency [name]'s Vitamin D level is not yet at goal. She denies nausea or vomiting.   2. B12 deficiency Alexa [name]'s last B12 level was below goal. She is on B12 supplementation.   3. Type 2 diabetes mellitus with other specified complication, unspecified whether long term insulin use (HCC) Alexa [name] is working on her diet and weight loss. She is not checking her blood sugars at home, but her last A1c was well controlled at 5.8. She has no signs of hypoglycemia.   4. Other depression, with emotinal eating behavior Alexa [name] is working on decreasing emotional eating behaviors, but she has struggled more with increased stress at home. She denies side effects with Wellbutrin.   Assessment/Plan:   1. Vitamin D deficiency We will refill prescription Vitamin D for 1 month. Bruchy will follow-up for routine testing of Vitamin D, at least 2-3 times per year to avoid over-replacement.  - Vitamin D, Ergocalciferol, (DRISDOL) 1.25 MG (50000 UNIT) CAPS capsule; Take 1 capsule (50,000 Units total) by mouth every 7 (seven) days.  Dispense: 4 capsule; Refill: 0  2.  B12 deficiency The diagnosis was reviewed with the patient. We will refill Vitamin B12 for 1 month. Orders and follow up as documented in patient record.  - cyanocobalamin (VITAMIN B12) 500 MCG tablet; [phone] mcg once daily  3. Type 2 diabetes mellitus with other specified complication, unspecified whether long term insulin use (HCC) Odelle will continue her medications, and we will refill metformin and Ozempic for 1 month.  - metFORMIN (GLUCOPHAGE) 500 MG tablet; Take 1 tablet (500 mg total) by mouth 2 (two) times daily with a meal.  Dispense: 60 tablet; Refill: 0 - Semaglutide, 2 MG/DOSE, 8 MG/3ML SOPN; Inject 2 mg as directed once a week.  Dispense: 3 mL; Refill: 0  4. Other depression, with emotinal eating behavior [name] will continue Wellbutrin SR 150 mg daily, and we will refill for 1 month. Emotional eating behavior strategies were discussed today.  - buPROPion (WELLBUTRIN SR) 150 MG 12 hr tablet; Take 1 tablet (150 mg total) by mouth daily.  Dispense: 30 tablet; Refill: 0  5. Obesity, Current BMI 65.2 Alexa [name] is currently in the action stage of change. As such, her goal is to continue with weight loss efforts. She has agreed to the Category 3 Plan.   Exercise goals: As is.   Behavioral modification strategies: increasing lean protein intake.  Alexa [name] has agreed to follow-up with our clinic in 3 weeks. She was informed of the importance of frequent follow-up visits to maximize her success with intensive lifestyle modifications for her multiple health conditions.   Objective:   Blood  pressure 119/78, pulse 70, temperature 98.3 F (36.8 C), height '5\' 4"'$  (1.626 m), weight (!) 379 lb (171.9 kg), SpO2 96 %. Body mass index is 65.06 kg/m.  General: Cooperative, alert, well developed, in no acute distress. HEENT: Conjunctivae and lids unremarkable. Cardiovascular: Regular rhythm.  Lungs: Normal work of breathing. Neurologic: No focal deficits.   Lab Results  Component Value  Date   CREATININE 0.87 11/06/2021   BUN 11 11/06/2021   NA 138 11/06/2021   K 4.6 11/06/2021   CL 99 11/06/2021   CO2 22 11/06/2021   Lab Results  Component Value Date   ALT 12 11/06/2021   AST 17 11/06/2021   ALKPHOS 54 11/06/2021   BILITOT 0.4 11/06/2021   Lab Results  Component Value Date   HGBA1C 5.8 (H) 10/11/2021   HGBA1C 5.6 02/08/2021   HGBA1C 6.2 (H) 11/08/2020   HGBA1C 6.0 (H) 06/22/2020   HGBA1C 5.8 (H) 03/02/2020   Lab Results  Component Value Date   INSULIN 17.2 10/11/2021   INSULIN 17.6 02/08/2021   INSULIN 3.9 11/08/2020   INSULIN 20.3 06/22/2020   Lab Results  Component Value Date   TSH 0.964 11/06/2021   Lab Results  Component Value Date   CHOL 150 11/06/2021   HDL 39 (L) 11/06/2021   LDLCALC 93 11/06/2021   LDLDIRECT 100 10/08/2015   TRIG 94 11/06/2021   CHOLHDL 3.8 11/06/2021   Lab Results  Component Value Date   VD25OH 23.5 (L) 10/11/2021   VD25OH 42.6 02/08/2021   VD25OH 28.1 (L) 11/08/2020   Lab Results  Component Value Date   WBC 6.5 11/06/2021   HGB 12.5 11/06/2021   HCT 35.2 11/06/2021   MCV 98 (H) 11/06/2021   PLT 372 11/06/2021   Lab Results  Component Value Date   IRON 38 (L) 10/08/2015   TIBC 416 10/08/2015   FERRITIN 27 10/08/2015   Attestation Statements:   Reviewed by clinician on day of visit: allergies, medications, problem list, medical history, surgical history, family history, social history, and previous encounter notes.  I have personally spent 42 minutes total time today in preparation, patient care, and documentation for this visit, including the following: review of clinical lab tests; review of medical tests/procedures/services.   I, Trixie Dredge, am acting as transcriptionist for Dennard Nip, MD.  I have reviewed the above documentation for accuracy and completeness, and I agree with the above. -  Dennard Nip, MD

## 2021-12-27 ENCOUNTER — Other Ambulatory Visit (INDEPENDENT_AMBULATORY_CARE_PROVIDER_SITE_OTHER): Payer: Self-pay | Admitting: Family Medicine

## 2021-12-27 ENCOUNTER — Other Ambulatory Visit (HOSPITAL_COMMUNITY): Payer: Self-pay

## 2021-12-27 ENCOUNTER — Other Ambulatory Visit: Payer: Self-pay | Admitting: Cardiovascular Disease

## 2021-12-27 ENCOUNTER — Other Ambulatory Visit: Payer: Self-pay | Admitting: Family Medicine

## 2021-12-27 DIAGNOSIS — R600 Localized edema: Secondary | ICD-10-CM

## 2021-12-27 DIAGNOSIS — E1169 Type 2 diabetes mellitus with other specified complication: Secondary | ICD-10-CM

## 2021-12-27 DIAGNOSIS — I2699 Other pulmonary embolism without acute cor pulmonale: Secondary | ICD-10-CM

## 2021-12-27 DIAGNOSIS — E538 Deficiency of other specified B group vitamins: Secondary | ICD-10-CM

## 2021-12-27 DIAGNOSIS — Z7901 Long term (current) use of anticoagulants: Secondary | ICD-10-CM

## 2021-12-27 DIAGNOSIS — G5793 Unspecified mononeuropathy of bilateral lower limbs: Secondary | ICD-10-CM

## 2021-12-27 MED ORDER — WARFARIN SODIUM 7.5 MG PO TABS
ORAL_TABLET | ORAL | 0 refills | Status: DC
  Filled 2021-12-27: qty 45, 34d supply, fill #0

## 2021-12-27 NOTE — Telephone Encounter (Signed)
Prescription refill request received for warfarin Lov: 03/08/20 Gwen[facility] Found)  Next INR check: 01/05/22 Warfarin tablet strength: 7.'5mg'$   Overdue to see provider. Placed note on next anticoagulation appt to take pt to scheduling. Appropriate dose and refill sent to requested pharmacy.

## 2022-01-04 ENCOUNTER — Ambulatory Visit (INDEPENDENT_AMBULATORY_CARE_PROVIDER_SITE_OTHER): Payer: 59 | Admitting: Family Medicine

## 2022-01-05 ENCOUNTER — Ambulatory Visit: Payer: Self-pay

## 2022-01-06 ENCOUNTER — Other Ambulatory Visit: Payer: Self-pay | Admitting: Family Medicine

## 2022-01-06 DIAGNOSIS — G5793 Unspecified mononeuropathy of bilateral lower limbs: Secondary | ICD-10-CM

## 2022-01-06 DIAGNOSIS — R600 Localized edema: Secondary | ICD-10-CM

## 2022-01-09 ENCOUNTER — Ambulatory Visit: Payer: Commercial Managed Care - HMO | Attending: Internal Medicine | Admitting: *Deleted

## 2022-01-09 DIAGNOSIS — Z5181 Encounter for therapeutic drug level monitoring: Secondary | ICD-10-CM

## 2022-01-09 DIAGNOSIS — I824Y9 Acute embolism and thrombosis of unspecified deep veins of unspecified proximal lower extremity: Secondary | ICD-10-CM

## 2022-01-09 DIAGNOSIS — I4891 Unspecified atrial fibrillation: Secondary | ICD-10-CM

## 2022-01-09 DIAGNOSIS — Z7901 Long term (current) use of anticoagulants: Secondary | ICD-10-CM | POA: Diagnosis not present

## 2022-01-09 LAB — POCT INR: INR: 1.8 — AB (ref 2.0–3.0)

## 2022-01-09 NOTE — Patient Instructions (Addendum)
Description   Take 1.5 tablets today and then start taking 1.5 tablets daily. Recheck INR in 4 weeks. [facility] [phone]

## 2022-01-15 ENCOUNTER — Other Ambulatory Visit (INDEPENDENT_AMBULATORY_CARE_PROVIDER_SITE_OTHER): Payer: Self-pay | Admitting: Family Medicine

## 2022-01-15 ENCOUNTER — Other Ambulatory Visit (HOSPITAL_COMMUNITY): Payer: Self-pay

## 2022-01-15 ENCOUNTER — Other Ambulatory Visit: Payer: Self-pay | Admitting: Cardiovascular Disease

## 2022-01-15 ENCOUNTER — Other Ambulatory Visit: Payer: Self-pay | Admitting: Family Medicine

## 2022-01-15 ENCOUNTER — Encounter: Payer: Self-pay | Admitting: Family Medicine

## 2022-01-15 DIAGNOSIS — R600 Localized edema: Secondary | ICD-10-CM

## 2022-01-15 DIAGNOSIS — I2699 Other pulmonary embolism without acute cor pulmonale: Secondary | ICD-10-CM

## 2022-01-15 DIAGNOSIS — E538 Deficiency of other specified B group vitamins: Secondary | ICD-10-CM

## 2022-01-15 DIAGNOSIS — G5793 Unspecified mononeuropathy of bilateral lower limbs: Secondary | ICD-10-CM

## 2022-01-15 DIAGNOSIS — Z7901 Long term (current) use of anticoagulants: Secondary | ICD-10-CM

## 2022-01-15 DIAGNOSIS — E559 Vitamin D deficiency, unspecified: Secondary | ICD-10-CM

## 2022-01-15 DIAGNOSIS — F3289 Other specified depressive episodes: Secondary | ICD-10-CM

## 2022-01-15 DIAGNOSIS — E1169 Type 2 diabetes mellitus with other specified complication: Secondary | ICD-10-CM

## 2022-01-15 MED ORDER — WARFARIN SODIUM 7.5 MG PO TABS
ORAL_TABLET | ORAL | 0 refills | Status: DC
  Filled 2022-01-15: qty 45, fill #0
  Filled 2022-01-29: qty 45, 30d supply, fill #0

## 2022-01-15 NOTE — Telephone Encounter (Signed)
Medication Refill - Medication: furosemide (LASIX) 40 MG tablet, gabapentin (NEURONTIN) 300 MG capsule, potassium chloride (KLOR-CON M) 10 MEQ tablet  Has the patient contacted their pharmacy? Yes.   No, more refills.   (Agent: If yes, when and what did the pharmacy advise?)  Preferred Pharmacy (with phone number or street name):  [facility] [place] [address]  Phone: [phone] Fax: [phone]  Hours: Mon-Fri 7:30am-6pm; Sat 8:00am-4:30pm   Has the patient been seen for an appointment in the last year OR does the patient have an upcoming appointment? Yes.    Agent: Please be advised that RX refills may take up to 3 business days. We ask that you follow-up with your pharmacy.

## 2022-01-16 ENCOUNTER — Encounter: Payer: Self-pay | Admitting: *Deleted

## 2022-01-16 ENCOUNTER — Other Ambulatory Visit (HOSPITAL_COMMUNITY): Payer: Self-pay

## 2022-01-16 ENCOUNTER — Other Ambulatory Visit: Payer: Self-pay | Admitting: *Deleted

## 2022-01-16 DIAGNOSIS — G5793 Unspecified mononeuropathy of bilateral lower limbs: Secondary | ICD-10-CM

## 2022-01-16 DIAGNOSIS — R600 Localized edema: Secondary | ICD-10-CM

## 2022-01-16 MED ORDER — FUROSEMIDE 40 MG PO TABS
40.0000 mg | ORAL_TABLET | Freq: Two times a day (BID) | ORAL | 0 refills | Status: DC
  Filled 2022-01-16: qty 180, 90d supply, fill #0

## 2022-01-16 MED ORDER — POTASSIUM CHLORIDE CRYS ER 10 MEQ PO TBCR
10.0000 meq | EXTENDED_RELEASE_TABLET | Freq: Two times a day (BID) | ORAL | 2 refills | Status: DC
  Filled 2022-01-16: qty 60, 30d supply, fill #0

## 2022-01-16 MED ORDER — GABAPENTIN 300 MG PO CAPS
300.0000 mg | ORAL_CAPSULE | Freq: Two times a day (BID) | ORAL | 0 refills | Status: DC
  Filled 2022-01-16: qty 180, 90d supply, fill #0

## 2022-01-16 NOTE — Telephone Encounter (Signed)
Pt called back to report that she is completely out of her current supply, please advise. Wants to know when this is submitted because she needs to contact [facility] for home delivery  [phone]

## 2022-01-16 NOTE — Telephone Encounter (Signed)
Requested medication (s) are due for refill today: yes  Requested medication (s) are on the active medication list: yes  Last refill:  10/06/21  Future visit scheduled: yes  Notes to clinic:  Unable to refill per protocol, Rx was refused 01/10/22 due to not being under provider's care. Patient has OV scheduled with Tamsen Meek. Routing for review.     Requested Prescriptions  Pending Prescriptions Disp Refills   gabapentin (NEURONTIN) 300 MG capsule 180 capsule 0     Neurology: Anticonvulsants - gabapentin Passed - 01/16/2022 10:35 AM      Passed - Cr in normal range and within 360 days    Creatinine  Date Value Ref Range Status  05/24/2017 0.84 0.60 - 1.10 mg/dL Final   Creat  Date Value Ref Range Status  01/28/2016 0.90 0.50 - 1.10 mg/dL Final   Creatinine, Ser  Date Value Ref Range Status  11/06/2021 0.87 0.57 - 1.00 mg/dL Final   Creatinine, Urine  Date Value Ref Range Status  05/14/2016 144.43 mg/dL Final         Passed - Completed PHQ-2 or PHQ-9 in the last 360 days      Passed - Valid encounter within last 12 months    Recent Outpatient Visits           2 months ago Annual physical exam   Primary Care at [facility], MD   10 months ago Essential hypertension   Primary Care at [facility], MD   1 year ago Generalized anxiety disorder   Primary Care at [facility], MD   1 year ago Type 2 diabetes mellitus with other specified complication, without long-term current use of insulin [facility])   Primary Care at [facility], MD   1 year ago Flu vaccine need   [facility] Delfino Lovett, NP       Future Appointments             In 3 months Dorna Mai, MD Primary Care at [facility]             potassium chloride (KLOR-CON M) 10 MEQ tablet 60 tablet 2     Endocrinology:  Minerals - Potassium Supplementation Passed - 01/16/2022 10:35 AM      Passed - K in normal  range and within 360 days    Potassium  Date Value Ref Range Status  11/06/2021 4.6 3.5 - 5.2 mmol/L Final         Passed - Cr in normal range and within 360 days    Creatinine  Date Value Ref Range Status  05/24/2017 0.84 0.60 - 1.10 mg/dL Final   Creat  Date Value Ref Range Status  01/28/2016 0.90 0.50 - 1.10 mg/dL Final   Creatinine, Ser  Date Value Ref Range Status  11/06/2021 0.87 0.57 - 1.00 mg/dL Final   Creatinine, Urine  Date Value Ref Range Status  05/14/2016 144.43 mg/dL Final         Passed - Valid encounter within last 12 months    Recent Outpatient Visits           2 months ago Annual physical exam   Primary Care at [facility], MD   10 months ago Essential hypertension   Primary Care at [facility], MD   1 year ago Generalized anxiety disorder   Primary Care at [facility], MD   1 year ago  Type 2 diabetes mellitus with other specified complication, without long-term current use of insulin [facility])   Primary Care at [facility], MD   1 year ago Flu vaccine need   [facility] Delfino Lovett, NP       Future Appointments             In 3 months Dorna Mai, MD Primary Care at [facility]             furosemide (LASIX) 40 MG tablet 180 tablet 0     Cardiovascular:  Diuretics - Loop Failed - 01/16/2022 10:35 AM      Failed - Mg Level in normal range and within 180 days    Magnesium  Date Value Ref Range Status  05/15/2016 2.0 1.7 - 2.4 mg/dL Final         Passed - K in normal range and within 180 days    Potassium  Date Value Ref Range Status  11/06/2021 4.6 3.5 - 5.2 mmol/L Final         Passed - Ca in normal range and within 180 days    Calcium  Date Value Ref Range Status  11/06/2021 9.2 8.7 - 10.2 mg/dL Final         Passed - Na in normal range and within 180 days    Sodium  Date Value Ref Range Status  11/06/2021 138 134 - 144 mmol/L Final          Passed - Cr in normal range and within 180 days    Creatinine  Date Value Ref Range Status  05/24/2017 0.84 0.60 - 1.10 mg/dL Final   Creat  Date Value Ref Range Status  01/28/2016 0.90 0.50 - 1.10 mg/dL Final   Creatinine, Ser  Date Value Ref Range Status  11/06/2021 0.87 0.57 - 1.00 mg/dL Final   Creatinine, Urine  Date Value Ref Range Status  05/14/2016 144.43 mg/dL Final         Passed - Cl in normal range and within 180 days    Chloride  Date Value Ref Range Status  11/06/2021 99 96 - 106 mmol/L Final         Passed - Last BP in normal range    BP Readings from Last 1 Encounters:  12/12/21 119/78         Passed - Valid encounter within last 6 months    Recent Outpatient Visits           2 months ago Annual physical exam   Primary Care at [facility], MD   10 months ago Essential hypertension   Primary Care at [facility], MD   1 year ago Generalized anxiety disorder   Primary Care at [facility], MD   1 year ago Type 2 diabetes mellitus with other specified complication, without long-term current use of insulin [facility])   Primary Care at [facility], MD   1 year ago Flu vaccine need   [facility] Delfino Lovett, NP       Future Appointments             In 3 months Dorna Mai, MD Primary Care at [facility]

## 2022-01-16 NOTE — Telephone Encounter (Signed)
Notes to clinic:  Not sure why this was refused as "not a pt of Dr Redmond Pulling anymore". She just had a physical and has a return appt, please assess.      Requested Prescriptions  Pending Prescriptions Disp Refills   gabapentin (NEURONTIN) 300 MG capsule 180 capsule 0     Neurology: Anticonvulsants - gabapentin Passed - 01/16/2022 10:35 AM      Passed - Cr in normal range and within 360 days    Creatinine  Date Value Ref Range Status  05/24/2017 0.84 0.60 - 1.10 mg/dL Final   Creat  Date Value Ref Range Status  01/28/2016 0.90 0.50 - 1.10 mg/dL Final   Creatinine, Ser  Date Value Ref Range Status  11/06/2021 0.87 0.57 - 1.00 mg/dL Final   Creatinine, Urine  Date Value Ref Range Status  05/14/2016 144.43 mg/dL Final         Passed - Completed PHQ-2 or PHQ-9 in the last 360 days      Passed - Valid encounter within last 12 months    Recent Outpatient Visits           2 months ago Annual physical exam   Primary Care at [facility], MD   10 months ago Essential hypertension   Primary Care at [facility], MD   1 year ago Generalized anxiety disorder   Primary Care at [facility], MD   1 year ago Type 2 diabetes mellitus with other specified complication, without long-term current use of insulin [facility])   Primary Care at [facility], MD   1 year ago Flu vaccine need   [facility] at [facility] Delfino Lovett, NP       Future Appointments             In 3 months Dorna Mai, MD Primary Care at [facility]             potassium chloride (KLOR-CON M) 10 MEQ tablet 60 tablet 2     Endocrinology:  Minerals - Potassium Supplementation Passed - 01/16/2022 10:35 AM      Passed - K in normal range and within 360 days    Potassium  Date Value Ref Range Status  11/06/2021 4.6 3.5 - 5.2 mmol/L Final         Passed - Cr in normal range and within 360 days    Creatinine  Date Value Ref Range  Status  05/24/2017 0.84 0.60 - 1.10 mg/dL Final   Creat  Date Value Ref Range Status  01/28/2016 0.90 0.50 - 1.10 mg/dL Final   Creatinine, Ser  Date Value Ref Range Status  11/06/2021 0.87 0.57 - 1.00 mg/dL Final   Creatinine, Urine  Date Value Ref Range Status  05/14/2016 144.43 mg/dL Final         Passed - Valid encounter within last 12 months    Recent Outpatient Visits           2 months ago Annual physical exam   Primary Care at [facility], MD   10 months ago Essential hypertension   Primary Care at [facility], MD   1 year ago Generalized anxiety disorder   Primary Care at [facility], MD   1 year ago Type 2 diabetes mellitus with other specified complication, without long-term current use of insulin [facility])   Primary Care at [facility], MD   1 year ago  Flu vaccine need   [facility] Delf[facility] Lovett, NP       Future Appointments             In 3 months Dorna Mai, MD Primary Care at [facility]             furosemide (LASIX) 40 MG tablet 180 tablet 0     Cardiovascular:  Diuretics - Loop Failed - 01/16/2022 10:35 AM      Failed - Mg Level in normal range and within 180 days    Magnesium  Date Value Ref Range Status  05/15/2016 2.0 1.7 - 2.4 mg/dL Final         Passed - K in normal range and within 180 days    Potassium  Date Value Ref Range Status  11/06/2021 4.6 3.5 - 5.2 mmol/L Final         Passed - Ca in normal range and within 180 days    Calcium  Date Value Ref Range Status  11/06/2021 9.2 8.7 - 10.2 mg/dL Final         Passed - Na in normal range and within 180 days    Sodium  Date Value Ref Range Status  11/06/2021 138 134 - 144 mmol/L Final         Passed - Cr in normal range and within 180 days    Creatinine  Date Value Ref Range Status  05/24/2017 0.84 0.60 - 1.10 mg/dL Final   Creat  Date Value Ref Range Status  01/28/2016 0.90 0.50  - 1.10 mg/dL Final   Creatinine, Ser  Date Value Ref Range Status  11/06/2021 0.87 0.57 - 1.00 mg/dL Final   Creatinine, Urine  Date Value Ref Range Status  05/14/2016 144.43 mg/dL Final         Passed - Cl in normal range and within 180 days    Chloride  Date Value Ref Range Status  11/06/2021 99 96 - 106 mmol/L Final         Passed - Last BP in normal range    BP Readings from Last 1 Encounters:  12/12/21 119/78         Passed - Valid encounter within last 6 months    Recent Outpatient Visits           2 months ago Annual physical exam   Primary Care at [facility], MD   10 months ago Essential hypertension   Primary Care at [facility], MD   1 year ago Generalized anxiety disorder   Primary Care at [facility], MD   1 year ago Type 2 diabetes mellitus with other specified complication, without long-term current use of insulin [facility])   Primary Care at [facility], MD   1 year ago Flu vaccine need   [facility] Lovett, NP       Future Appointments             In 3 months Dorna Mai, MD Primary Care at [facility]

## 2022-01-17 ENCOUNTER — Encounter (INDEPENDENT_AMBULATORY_CARE_PROVIDER_SITE_OTHER): Payer: Self-pay | Admitting: Family Medicine

## 2022-01-17 ENCOUNTER — Other Ambulatory Visit (HOSPITAL_COMMUNITY): Payer: Self-pay

## 2022-01-17 ENCOUNTER — Ambulatory Visit (INDEPENDENT_AMBULATORY_CARE_PROVIDER_SITE_OTHER): Payer: Commercial Managed Care - HMO | Admitting: Family Medicine

## 2022-01-17 VITALS — BP 117/75 | HR 78 | Temp 98.3°F | Ht 64.0 in | Wt 377.0 lb

## 2022-01-17 DIAGNOSIS — E538 Deficiency of other specified B group vitamins: Secondary | ICD-10-CM | POA: Diagnosis not present

## 2022-01-17 DIAGNOSIS — E1169 Type 2 diabetes mellitus with other specified complication: Secondary | ICD-10-CM

## 2022-01-17 DIAGNOSIS — F3289 Other specified depressive episodes: Secondary | ICD-10-CM

## 2022-01-17 DIAGNOSIS — Z7985 Long-term (current) use of injectable non-insulin antidiabetic drugs: Secondary | ICD-10-CM

## 2022-01-17 DIAGNOSIS — E669 Obesity, unspecified: Secondary | ICD-10-CM

## 2022-01-17 DIAGNOSIS — Z6841 Body Mass Index (BMI) 40.0 and over, adult: Secondary | ICD-10-CM

## 2022-01-17 DIAGNOSIS — E559 Vitamin D deficiency, unspecified: Secondary | ICD-10-CM

## 2022-01-17 MED ORDER — SEMAGLUTIDE (1 MG/DOSE) 4 MG/3ML ~~LOC~~ SOPN
1.0000 mg | PEN_INJECTOR | SUBCUTANEOUS | 0 refills | Status: DC
  Filled 2022-01-17 – 2022-02-16 (×3): qty 3, 28d supply, fill #0

## 2022-01-17 MED ORDER — CYANOCOBALAMIN 500 MCG PO TABS: ORAL_TABLET | ORAL | Status: DC

## 2022-01-17 MED ORDER — BUPROPION HCL ER (SR) 150 MG PO TB12
150.0000 mg | ORAL_TABLET | Freq: Every day | ORAL | 0 refills | Status: DC
  Filled 2022-01-17: qty 30, 30d supply, fill #0

## 2022-01-17 MED ORDER — METFORMIN HCL 500 MG PO TABS
500.0000 mg | ORAL_TABLET | Freq: Two times a day (BID) | ORAL | 0 refills | Status: DC
  Filled 2022-01-17: qty 60, 30d supply, fill #0

## 2022-01-17 MED ORDER — VITAMIN D (ERGOCALCIFEROL) 1.25 MG (50000 UNIT) PO CAPS
50000.0000 [IU] | ORAL_CAPSULE | ORAL | 0 refills | Status: DC
  Filled 2022-01-17: qty 4, 28d supply, fill #0

## 2022-01-22 NOTE — Progress Notes (Unsigned)
Chief Complaint:   OBESITY Alexa [name] is here to discuss her progress with her obesity treatment plan along with follow-up of her obesity related diagnoses. Alexa [name] is on {[email or web address]/plan2:23431} and states she is following her eating plan approximately ***% of the time. Alexa [name] states she is *** *** minutes *** times per week.  Today's visit was #: *** Starting weight: *** Starting date: *** Today's weight: *** Today's date: 01/17/2022 Total lbs lost to date: *** Total lbs lost since last in-office visit: ***  Interim History: ***  Subjective:   1. B12 deficiency ***  2. Type 2 diabetes mellitus with other specified complication, unspecified whether long term insulin use (HCC) ***  3. Vitamin D deficiency ***  4. Other depression, with emotinal eating behavior ***  Assessment/Plan:   1. B12 deficiency *** - cyanocobalamin (VITAMIN B12) 500 MCG tablet; [phone] mcg once daily  2. Type 2 diabetes mellitus with other specified complication, unspecified whether long term insulin use (HCC) *** - metFORMIN (GLUCOPHAGE) 500 MG tablet; Take 1 tablet (500 mg total) by mouth 2 (two) times daily with a meal.  Dispense: 60 tablet; Refill: 0 - Semaglutide, 1 MG/DOSE, 4 MG/3ML SOPN; Inject 1 mg as directed once a week.  Dispense: 3 mL; Refill: 0  3. Vitamin D deficiency *** - Vitamin D, Ergocalciferol, (DRISDOL) 1.25 MG (50000 UNIT) CAPS capsule; Take 1 capsule (50,000 Units total) by mouth every 7 (seven) days.  Dispense: 4 capsule; Refill: 0  4. Other depression, with emotinal eating behavior *** - buPROPion (WELLBUTRIN SR) 150 MG 12 hr tablet; Take 1 tablet (150 mg total) by mouth daily.  Dispense: 30 tablet; Refill: 0  5. Obesity, Current BMI 64.7 Alexa [name] is currently in the action stage of change. As such, her goal is to continue with weight loss efforts. She has agreed to the Category 3 Plan.   Exercise goals: As is.  Behavioral modification strategies: increasing  lean protein intake.  Alexa [name] has agreed to follow-up with our clinic in 3 to 4 weeks. She was informed of the importance of frequent follow-up visits to maximize her success with intensive lifestyle modifications for her multiple health conditions.   Objective:   Blood pressure 117/75, pulse 78, temperature 98.3 F (36.8 C), height '5\' 4"'$  (1.626 m), weight (!) 377 lb (171 kg), last menstrual period 01/03/2022, SpO2 100 %. Body mass index is 64.71 kg/m.  General: Cooperative, alert, well developed, in no acute distress. HEENT: Conjunctivae and lids unremarkable. Cardiovascular: Regular rhythm.  Lungs: Normal work of breathing. Neurologic: No focal deficits.   Lab Results  Component Value Date   CREATININE 0.87 11/06/2021   BUN 11 11/06/2021   NA 138 11/06/2021   K 4.6 11/06/2021   CL 99 11/06/2021   CO2 22 11/06/2021   Lab Results  Component Value Date   ALT 12 11/06/2021   AST 17 11/06/2021   ALKPHOS 54 11/06/2021   BILITOT 0.4 11/06/2021   Lab Results  Component Value Date   HGBA1C 5.8 (H) 10/11/2021   HGBA1C 5.6 02/08/2021   HGBA1C 6.2 (H) 11/08/2020   HGBA1C 6.0 (H) 06/22/2020   HGBA1C 5.8 (H) 03/02/2020   Lab Results  Component Value Date   INSULIN 17.2 10/11/2021   INSULIN 17.6 02/08/2021   INSULIN 3.9 11/08/2020   INSULIN 20.3 06/22/2020   Lab Results  Component Value Date   TSH 0.964 11/06/2021   Lab Results  Component Value Date   CHOL 150 11/06/2021   HDL 39 (  L) 11/06/2021   LDLCALC 93 11/06/2021   LDLDIRECT 100 10/08/2015   TRIG 94 11/06/2021   CHOLHDL 3.8 11/06/2021   Lab Results  Component Value Date   VD25OH 23.5 (L) 10/11/2021   VD25OH 42.6 02/08/2021   VD25OH 28.1 (L) 11/08/2020   Lab Results  Component Value Date   WBC 6.5 11/06/2021   HGB 12.5 11/06/2021   HCT 35.2 11/06/2021   MCV 98 (H) 11/06/2021   PLT 372 11/06/2021   Lab Results  Component Value Date   IRON 38 (L) 10/08/2015   TIBC 416 10/08/2015   FERRITIN 27  10/08/2015   Attestation Statements:   Reviewed by clinician on day of visit: allergies, medications, problem list, medical history, surgical history, family history, social history, and previous encounter notes.   I, Trixie Dredge, am acting as transcriptionist for Dennard Nip, MD.  I have reviewed the above documentation for accuracy and completeness, and I agree with the above. -  ***

## 2022-01-30 ENCOUNTER — Other Ambulatory Visit (HOSPITAL_COMMUNITY): Payer: Self-pay

## 2022-01-31 ENCOUNTER — Other Ambulatory Visit (HOSPITAL_COMMUNITY): Payer: Self-pay

## 2022-02-06 ENCOUNTER — Ambulatory Visit: Payer: Self-pay | Attending: Internal Medicine

## 2022-02-07 ENCOUNTER — Encounter: Payer: Self-pay | Admitting: Podiatry

## 2022-02-07 ENCOUNTER — Ambulatory Visit (INDEPENDENT_AMBULATORY_CARE_PROVIDER_SITE_OTHER): Payer: Commercial Managed Care - HMO | Admitting: Podiatry

## 2022-02-07 DIAGNOSIS — M79675 Pain in left toe(s): Secondary | ICD-10-CM | POA: Diagnosis not present

## 2022-02-07 DIAGNOSIS — L84 Corns and callosities: Secondary | ICD-10-CM

## 2022-02-07 DIAGNOSIS — B351 Tinea unguium: Secondary | ICD-10-CM

## 2022-02-07 DIAGNOSIS — M79674 Pain in right toe(s): Secondary | ICD-10-CM | POA: Diagnosis not present

## 2022-02-07 DIAGNOSIS — E1142 Type 2 diabetes mellitus with diabetic polyneuropathy: Secondary | ICD-10-CM

## 2022-02-12 NOTE — Progress Notes (Signed)
  Subjective:  Patient ID: Alexa [name], female    DOB: 07-20-1975,  MRN: [id number]  Alexa [name] presents to clinic today for preventative diabetic foot care and callus(es) bilateral heels and painful thick toenails that are difficult to trim. Painful toenails interfere with ambulation. Aggravating factors include wearing enclosed shoe gear. Pain is relieved with periodic professional debridement. Painful calluses are aggravated when weightbearing with and without shoegear. Pain is relieved with periodic professional debridement.  Chief Complaint  Patient presents with   Nail Problem    Diabetic foot care BS-do not check  A1C-5.1 PCP-Amelia Wilson PCP VST-2 or 3 months ago   New problem(s): None.   PCP is Dorna Mai, MD.  Allergies  Allergen Reactions   Penicillins Hives    Has patient had a PCN reaction causing immediate rash, facial/tongue/throat swelling, SOB or lightheadedness with hypotension: No Has patient had a PCN reaction causing severe rash involving mucus membranes or skin necrosis: No Has patient had a PCN reaction that required hospitalization No Has patient had a PCN reaction occurring within the last 10 years: No If all of the above answers are "NO", then may proceed with Cephalosporin use.    Review of Systems: Negative except as noted in the HPI.  Objective: No changes noted in today's physical examination.  DHANVI BOESEN is a pleasant 46 y.o. female morbidly obese in NAD. AAO x 3. Vascular Examination: CFT <3 seconds b/l LE. Faintly palpable DP pulses b/l LE. Diminished PT pulse(s) b/l LE. Pedal hair absent. No pain with calf compression b/l. Lower extremity skin temperature gradient within normal limits. Nonpitting edema noted BLE. Evidence of chronic venous insufficiency b/l LE. No ischemia or gangrene noted b/l LE. No cyanosis or clubbing noted b/l LE.  Dermatological Examination: No open wounds b/l LE. No interdigital macerations noted b/l  LE. Toenails 1-5 b/l elongated, discolored, dystrophic, thickened, crumbly with subungual debris and tenderness to dorsal palpation. Hyperkeratotic lesion(s) bilateral heels and plantarlateral aspect of midfoot b/l.  No erythema, no edema, no drainage, no fluctuance. Evidence of chronic venous insufficiency b/l lower extremities. Hyperpigmentation consistent with findings of chronic venous insufficiency is present b/l lower extremities. Pedal skin noted to be dry b/l lower extremities.  Neurological Examination: Pt has subjective symptoms of neuropathy. Protective sensation intact 5/5 intact bilaterally with 10g monofilament b/l. Vibratory sensation decreased b/l.  Musculoskeletal Examination: Muscle strength 5/5 to all lower extremity muscle groups bilaterally. Pes planus deformity noted bilateral LE. Utilizes cane for ambulation assistance.  Assessment/Plan: 1. Pain due to onychomycosis of toenails of both feet   2. Callus of foot   3. Diabetic peripheral neuropathy associated with type 2 diabetes mellitus ([place])     No orders of the defined types were placed in this encounter.   -Consent given for treatment as described below: -Continue supportive shoe gear daily. -Mycotic toenails 1-5 bilaterally were debrided in length and girth with sterile nail nippers and dremel without incident. -Callus(es) bilateral heels and plantarlateral aspect of midfoot b/l pared utilizing rotary bur without complication or incident. Total number pared =4. -Patient/POA to call should there be question/concern in the interim.   Return in about 3 months (around 05/10/2022).  Marzetta Board, DPM

## 2022-02-13 ENCOUNTER — Ambulatory Visit: Payer: Self-pay | Attending: Family Medicine

## 2022-02-14 ENCOUNTER — Ambulatory Visit (INDEPENDENT_AMBULATORY_CARE_PROVIDER_SITE_OTHER): Payer: Commercial Managed Care - HMO | Admitting: Family Medicine

## 2022-02-16 ENCOUNTER — Other Ambulatory Visit (HOSPITAL_COMMUNITY): Payer: Self-pay

## 2022-02-20 ENCOUNTER — Encounter (INDEPENDENT_AMBULATORY_CARE_PROVIDER_SITE_OTHER): Payer: Self-pay | Admitting: Physician Assistant

## 2022-02-20 ENCOUNTER — Other Ambulatory Visit (HOSPITAL_COMMUNITY): Payer: Self-pay

## 2022-02-20 ENCOUNTER — Ambulatory Visit (INDEPENDENT_AMBULATORY_CARE_PROVIDER_SITE_OTHER): Payer: Commercial Managed Care - HMO | Admitting: Physician Assistant

## 2022-02-20 VITALS — BP 120/87 | HR 75 | Temp 98.3°F | Ht 64.0 in | Wt 369.0 lb

## 2022-02-20 DIAGNOSIS — E1169 Type 2 diabetes mellitus with other specified complication: Secondary | ICD-10-CM

## 2022-02-20 DIAGNOSIS — E876 Hypokalemia: Secondary | ICD-10-CM

## 2022-02-20 DIAGNOSIS — E559 Vitamin D deficiency, unspecified: Secondary | ICD-10-CM

## 2022-02-20 DIAGNOSIS — F3289 Other specified depressive episodes: Secondary | ICD-10-CM

## 2022-02-20 DIAGNOSIS — Z6841 Body Mass Index (BMI) 40.0 and over, adult: Secondary | ICD-10-CM

## 2022-02-20 DIAGNOSIS — E669 Obesity, unspecified: Secondary | ICD-10-CM

## 2022-02-20 DIAGNOSIS — Z7985 Long-term (current) use of injectable non-insulin antidiabetic drugs: Secondary | ICD-10-CM

## 2022-02-20 DIAGNOSIS — Z7984 Long term (current) use of oral hypoglycemic drugs: Secondary | ICD-10-CM

## 2022-02-20 MED ORDER — METFORMIN HCL 500 MG PO TABS
500.0000 mg | ORAL_TABLET | Freq: Two times a day (BID) | ORAL | 0 refills | Status: DC
  Filled 2022-02-20: qty 60, 30d supply, fill #0

## 2022-02-20 MED ORDER — SEMAGLUTIDE (1 MG/DOSE) 4 MG/3ML ~~LOC~~ SOPN
1.0000 mg | PEN_INJECTOR | SUBCUTANEOUS | 0 refills | Status: DC
  Filled 2022-02-20 – 2022-03-14 (×2): qty 3, 28d supply, fill #0

## 2022-02-20 MED ORDER — BUPROPION HCL ER (SR) 150 MG PO TB12
150.0000 mg | ORAL_TABLET | Freq: Every day | ORAL | 0 refills | Status: DC
  Filled 2022-02-20: qty 30, 30d supply, fill #0

## 2022-02-20 MED ORDER — POTASSIUM CHLORIDE CRYS ER 10 MEQ PO TBCR
10.0000 meq | EXTENDED_RELEASE_TABLET | Freq: Two times a day (BID) | ORAL | 2 refills | Status: DC
  Filled 2022-02-20: qty 60, 30d supply, fill #0
  Filled 2022-03-21: qty 60, 30d supply, fill #1
  Filled 2022-04-17: qty 60, 30d supply, fill #2

## 2022-02-20 MED ORDER — VITAMIN D (ERGOCALCIFEROL) 1.25 MG (50000 UNIT) PO CAPS
50000.0000 [IU] | ORAL_CAPSULE | ORAL | 0 refills | Status: DC
  Filled 2022-02-20: qty 4, 28d supply, fill #0

## 2022-02-21 ENCOUNTER — Ambulatory Visit (INDEPENDENT_AMBULATORY_CARE_PROVIDER_SITE_OTHER): Payer: Commercial Managed Care - HMO | Admitting: Family Medicine

## 2022-02-22 ENCOUNTER — Ambulatory Visit: Payer: Self-pay | Admitting: Nurse Practitioner

## 2022-02-22 NOTE — Progress Notes (Deleted)
Office Visit    Patient Name: Alexa [name] Date of Encounter: 02/22/2022  Primary Care Provider:  Dorna Mai, MD Primary Cardiologist:  Quay Burow, MD  Chief Complaint    46 year old female with a isostery of right-sided heart failure/pulmonary hypertension, submassive PE  in 2018 on chronic Coumadin, hypertension, type 2 diabetes, OSA, and obesity who presents for follow-up related to pulmonary hypertension/right-sided heart failure.  Past Medical History    Past Medical History:  Diagnosis Date   Anemia    Back pain    Bilateral swelling of feet    CHF (congestive heart failure) (HCC)    Diabetes mellitus without complication ([place])    Phreesia 03/02/2020   DM2 (diabetes mellitus, type 2) (HCC)    Hypertension    Joint pain    Nocturnal hypoxia    Obstructive sleep apnea syndrome, moderate    Pulmonary embolism ([place]) 05/14/2016   submassive, on chronic anticoagulation   Pulmonary hypertension ([place])    Vitamin D deficiency    Past Surgical History:  Procedure Laterality Date   CESAREAN SECTION      Allergies  Allergies  Allergen Reactions   Penicillins Hives    Has patient had a PCN reaction causing immediate rash, facial/tongue/throat swelling, SOB or lightheadedness with hypotension: No Has patient had a PCN reaction causing severe rash involving mucus membranes or skin necrosis: No Has patient had a PCN reaction that required hospitalization No Has patient had a PCN reaction occurring within the last 10 years: No If all of the above answers are "NO", then may proceed with Cephalosporin use.    History of Present Illness    46 year old female with the above past medical history including right-sided heart failure/pulmonary hypertension, submassive PE  in 2018 on chronic Coumadin, hypertension, type 2 diabetes, OSA, and obesity.  She was hospitalized in 2018 with a submassive PE.  She is on chronic Coumadin.  Echocardiogram in 2019 revealed normal  LV systolic function, findings consistent with chronic cor pulmonale with pulmonary hypertension and dilated right ventricle.  She was last seen in the office on 03/08/2020 and was stable from a cardiac standpoint.  Repeat echocardiogram in 03/2020 showed EF 60 to 65%, normal LV function, no RWMA, mild LVH, G1 DD, normal RV systolic function, moderately enlarged RV.  Has not been seen in follow-up since.  She follows with the [facility] in the setting of obesity.  She presents today for follow-up.  Since her last visit  History of right-sided heart failure/pulmonary hypertension: History of submassive PE: Hypertension: Type 2 diabetes: OSA: Obesity: Disposition:    Home Medications    Current Outpatient Medications  Medication Sig Dispense Refill   acetaminophen (TYLENOL) 500 MG tablet Take 1,000 mg by mouth daily as needed for mild pain.     blood glucose meter kit and supplies KIT Use up to four times daily as directed. 1 each 0   buPROPion (WELLBUTRIN SR) 150 MG 12 hr tablet Take 1 tablet (150 mg total) by mouth daily. 30 tablet 0   cetirizine (ZYRTEC) 10 MG tablet Take 1 tablet (10 mg total) by mouth daily. 30 tablet 1   cyanocobalamin (VITAMIN B12) 500 MCG tablet [phone] mcg once daily     furosemide (LASIX) 40 MG tablet Take 1 tablet (40 mg total) by mouth 2 (two) times daily. 180 tablet 0   gabapentin (NEURONTIN) 300 MG capsule Take 1 capsule (300 mg total) by mouth 2 (two) times daily.  180 capsule 0   glucose blood test strip ascensia  Meter/strips preferred Test 3x daily Use as instructed Dx.dm 100 each 0   Lancets (ONETOUCH ULTRASOFT) lancets Test daily Use as instructed dm2 100 each 12   metFORMIN (GLUCOPHAGE) 500 MG tablet Take 1 tablet (500 mg total) by mouth 2 (two) times daily with a meal. 60 tablet 0   Misc. Devices MISC Bariatric size shower chair with handle 423 lbs, BMI 72  Dx. Pulmonary HTN, DM2 with neuropathy, morbid obesity 1 each 0    potassium chloride (KLOR-CON M) 10 MEQ tablet Take 1 tablet (10 mEq total) by mouth 2 (two) times daily. 60 tablet 2   Semaglutide, 1 MG/DOSE, 4 MG/3ML SOPN Inject 1 mg as directed once a week. 3 mL 0   Vitamin D, Ergocalciferol, (DRISDOL) 1.25 MG (50000 UNIT) CAPS capsule Take 1 capsule (50,000 Units total) by mouth every 7 (seven) days. 4 capsule 0   warfarin (COUMADIN) 7.5 MG tablet TAKE 1 TO 1.5 TABLETS BY MOUTH ONCE DAILY AS DIRECTED BY COUMADIN CLINIC. Pt is Overdue for follow-up, MUST keep appt for FUTURE refills. 45 tablet 0   No current facility-administered medications for this visit.     Review of Systems    ***.  All other systems reviewed and are otherwise negative except as noted above.    Physical Exam    VS:  There were no vitals taken for this visit. , BMI There is no height or weight on file to calculate BMI.     GEN: Well nourished, well developed, in no acute distress. HEENT: normal. Neck: Supple, no JVD, carotid bruits, or masses. Cardiac: RRR, no murmurs, rubs, or gallops. No clubbing, cyanosis, edema.  Radials/DP/PT 2+ and equal bilaterally.  Respiratory:  Respirations regular and unlabored, clear to auscultation bilaterally. GI: Soft, nontender, nondistended, BS + x 4. MS: no deformity or atrophy. Skin: warm and dry, no rash. Neuro:  Strength and sensation are intact. Psych: Normal affect.  Accessory Clinical Findings    ECG personally reviewed by me today - *** - no acute changes.   Lab Results  Component Value Date   WBC 6.5 11/06/2021   HGB 12.5 11/06/2021   HCT 35.2 11/06/2021   MCV 98 (H) 11/06/2021   PLT 372 11/06/2021   Lab Results  Component Value Date   CREATININE 0.87 11/06/2021   BUN 11 11/06/2021   NA 138 11/06/2021   K 4.6 11/06/2021   CL 99 11/06/2021   CO2 22 11/06/2021   Lab Results  Component Value Date   ALT 12 11/06/2021   AST 17 11/06/2021   ALKPHOS 54 11/06/2021   BILITOT 0.4 11/06/2021   Lab Results  Component  Value Date   CHOL 150 11/06/2021   HDL 39 (L) 11/06/2021   LDLCALC 93 11/06/2021   LDLDIRECT 100 10/08/2015   TRIG 94 11/06/2021   CHOLHDL 3.8 11/06/2021    Lab Results  Component Value Date   HGBA1C 5.8 (H) 10/11/2021    Assessment & Plan    1.  ***  No BP recorded.  {Refresh Note OR Click here to enter BP  :1}***   Lenna Sciara, NP 02/22/2022, 5:15 AM

## 2022-02-27 ENCOUNTER — Other Ambulatory Visit (HOSPITAL_COMMUNITY): Payer: Self-pay

## 2022-02-27 ENCOUNTER — Other Ambulatory Visit: Payer: Self-pay

## 2022-02-27 ENCOUNTER — Ambulatory Visit: Payer: Commercial Managed Care - HMO | Attending: Cardiology

## 2022-02-27 DIAGNOSIS — Z7901 Long term (current) use of anticoagulants: Secondary | ICD-10-CM

## 2022-02-27 DIAGNOSIS — Z5181 Encounter for therapeutic drug level monitoring: Secondary | ICD-10-CM

## 2022-02-27 DIAGNOSIS — I2699 Other pulmonary embolism without acute cor pulmonale: Secondary | ICD-10-CM

## 2022-02-27 LAB — POCT INR: INR: 2.2 (ref 2.0–3.0)

## 2022-02-27 MED ORDER — WARFARIN SODIUM 7.5 MG PO TABS
7.5000 mg | ORAL_TABLET | Freq: Every day | ORAL | 1 refills | Status: DC
  Filled 2022-02-27: qty 45, 30d supply, fill #0
  Filled 2022-04-02: qty 45, 30d supply, fill #1

## 2022-02-27 NOTE — Patient Instructions (Signed)
Continue taking 1.5 tablets daily. Recheck INR in 4 weeks. Coumadin Clinic [phone]

## 2022-02-28 ENCOUNTER — Other Ambulatory Visit (HOSPITAL_COMMUNITY): Payer: Self-pay

## 2022-02-28 NOTE — Progress Notes (Unsigned)
Chief Complaint:   OBESITY Alexa [name] is here to discuss her progress with her obesity treatment plan along with follow-up of her obesity related diagnoses. Alexa [name] is on the Category 3 Plan and states she is following her eating plan approximately 85% of the time. Alexa [name] states she is walking 15-30 minutes 3-4 times per week.  Today's visit was #: 24 Starting weight: 434 lbs Starting date: 06/22/2020 Today's weight: 369 lbs Today's date: 02/20/2022 Total lbs lost to date: 65 lbs Total lbs lost since last in-office visit: 8  Interim History: Alexa [name] has done very well with weight loss. Working on meeting protein goals and making better choices. Hunger controlled-(has been off GLP-1 for a couple of weeks due to supply issues/national shortage). Has a Christmas play coming up.   Subjective:   1. Type 2 diabetes mellitus with other specified complication, unspecified whether long term insulin use (HCC) Alexa [name]'s A1c was 5.8 on 10/11/21, insulin was 17.2. Taking Metformin--Denies any side effects. Taking Ozempic, but recently supply issues/national shortage and been off of Ozempic for a couple of weeks--Denies any side effects, no N/V, no diarrhea or constipation, no neck masses.  2. Vitamin D deficiency Alexa [name] is currently taking prescription Vit D 50,000 IU once a week. Vit D level 23.5 10/11/21. Denies any side effects. Denies N/V, muscle weakness.   3. Decreased potassium in the blood Alexa [name] is taking Lasix regularly and Klor-Con twice a day per PCP. She is out of Klor-con and would like [place] to refill today if possible. Last potassium level 4.6 11/06/21.   4. Other depression, with emotional eating behavior Alexa [name] is taking Wellbutrin SR 150 mg daily. Denies any side effects.  Assessment/Plan:   1. Type 2 diabetes mellitus with other specified complication, unspecified whether long term insulin use (HCC) We will refill Metformin 500 mg twice a day AND refill Ozempic 1 mg SQ once a week  for 1 month with 0 refills. May want to increase Ozempic Continue eating plan to promote weight loss and exercise. Will obtain labs in next 2-3 months.   - Refill metFORMIN (GLUCOPHAGE) 500 MG tablet; Take 1 tablet (500 mg total) by mouth 2 (two) times daily with a meal.  Dispense: 60 tablet; Refill: 0  -Refill Semaglutide, 1 MG/DOSE, 4 MG/3ML SOPN; Inject 1 mg as directed once a week.  Dispense: 3 mL; Refill: 0  2. Vitamin D deficiency We will refill Vit D 50,000 IU once weekly for 1 month with 0 refills.  -Refill Vitamin D, Ergocalciferol, (DRISDOL) 1.25 MG (50000 UNIT) CAPS capsule; Take 1 capsule (50,000 Units total) by mouth every 7 (seven) days.  Dispense: 4 capsule; Refill: 0  3. Decreased potassium in the blood We will refill Potassium 10 meq twice a day for 1 month with 0 refills.  -Refill potassium chloride (KLOR-CON M) 10 MEQ tablet; Take 1 tablet (10 mEq total) by mouth 2 (two) times daily.  Dispense: 60 tablet; Refill: 2  4. Other depression, with emotional eating behavior We will refill Wellbutrin SR 150 mg daily for 1 month with 0 refills. Continue eating plan and exercise.  -Refill buPROPion (WELLBUTRIN SR) 150 MG 12 hr tablet; Take 1 tablet (150 mg total) by mouth daily.  Dispense: 30 tablet; Refill: 0  5. Obesity, Current BMI 63.3 Alexa [name] is currently in the action stage of change. As such, her goal is to continue with weight loss efforts. She has agreed to the Category 3 Plan.   Exercise goals: As is. Adding strengthening.  Behavioral modification strategies: increasing lean protein intake, decreasing simple carbohydrates, meal planning and cooking strategies, and celebration eating strategies.  Alexa [name] has agreed to follow-up with our clinic in 3 weeks. She was informed of the importance of frequent follow-up visits to maximize her success with intensive lifestyle modifications for her multiple health conditions.   Objective:   Blood pressure 120/87, pulse 75,  temperature 98.3 F (36.8 C), height '5\' 4"'$  (1.626 m), weight (!) 369 lb (167.4 kg), SpO2 100 %. Body mass index is 63.34 kg/m.  General: Cooperative, alert, well developed, in no acute distress. HEENT: Conjunctivae and lids unremarkable. Cardiovascular: Regular rhythm.  Lungs: Normal work of breathing. Neurologic: No focal deficits.   Lab Results  Component Value Date   CREATININE 0.87 11/06/2021   BUN 11 11/06/2021   NA 138 11/06/2021   K 4.6 11/06/2021   CL 99 11/06/2021   CO2 22 11/06/2021   Lab Results  Component Value Date   ALT 12 11/06/2021   AST 17 11/06/2021   ALKPHOS 54 11/06/2021   BILITOT 0.4 11/06/2021   Lab Results  Component Value Date   HGBA1C 5.8 (H) 10/11/2021   HGBA1C 5.6 02/08/2021   HGBA1C 6.2 (H) 11/08/2020   HGBA1C 6.0 (H) 06/22/2020   HGBA1C 5.8 (H) 03/02/2020   Lab Results  Component Value Date   INSULIN 17.2 10/11/2021   INSULIN 17.6 02/08/2021   INSULIN 3.9 11/08/2020   INSULIN 20.3 06/22/2020   Lab Results  Component Value Date   TSH 0.964 11/06/2021   Lab Results  Component Value Date   CHOL 150 11/06/2021   HDL 39 (L) 11/06/2021   LDLCALC 93 11/06/2021   LDLDIRECT 100 10/08/2015   TRIG 94 11/06/2021   CHOLHDL 3.8 11/06/2021   Lab Results  Component Value Date   VD25OH 23.5 (L) 10/11/2021   VD25OH 42.6 02/08/2021   VD25OH 28.1 (L) 11/08/2020   Lab Results  Component Value Date   WBC 6.5 11/06/2021   HGB 12.5 11/06/2021   HCT 35.2 11/06/2021   MCV 98 (H) 11/06/2021   PLT 372 11/06/2021   Lab Results  Component Value Date   IRON 38 (L) 10/08/2015   TIBC 416 10/08/2015   FERRITIN 27 10/08/2015   Attestation Statements:   Reviewed by clinician on day of visit: allergies, medications, problem list, medical history, surgical history, family history, social history, and previous encounter notes.  I, Brendell Tyus, am acting as transcriptionist for [employer], PA.  I have reviewed the above  documentation for accuracy and completeness, and I agree with the above. -Kiyon Fidalgo,PA-C

## 2022-03-01 ENCOUNTER — Other Ambulatory Visit (HOSPITAL_COMMUNITY): Payer: Self-pay

## 2022-03-14 ENCOUNTER — Ambulatory Visit (INDEPENDENT_AMBULATORY_CARE_PROVIDER_SITE_OTHER): Payer: Commercial Managed Care - HMO | Admitting: Family Medicine

## 2022-03-14 ENCOUNTER — Other Ambulatory Visit: Payer: Self-pay

## 2022-03-14 ENCOUNTER — Other Ambulatory Visit (HOSPITAL_COMMUNITY): Payer: Self-pay

## 2022-03-14 ENCOUNTER — Encounter (INDEPENDENT_AMBULATORY_CARE_PROVIDER_SITE_OTHER): Payer: Self-pay | Admitting: Family Medicine

## 2022-03-14 VITALS — BP 122/76 | HR 82 | Temp 97.9°F | Ht 64.0 in | Wt 375.0 lb

## 2022-03-14 DIAGNOSIS — E669 Obesity, unspecified: Secondary | ICD-10-CM

## 2022-03-14 DIAGNOSIS — Z7985 Long-term (current) use of injectable non-insulin antidiabetic drugs: Secondary | ICD-10-CM

## 2022-03-14 DIAGNOSIS — Z6841 Body Mass Index (BMI) 40.0 and over, adult: Secondary | ICD-10-CM

## 2022-03-14 DIAGNOSIS — E559 Vitamin D deficiency, unspecified: Secondary | ICD-10-CM | POA: Diagnosis not present

## 2022-03-14 DIAGNOSIS — Z7984 Long term (current) use of oral hypoglycemic drugs: Secondary | ICD-10-CM

## 2022-03-14 DIAGNOSIS — E1169 Type 2 diabetes mellitus with other specified complication: Secondary | ICD-10-CM | POA: Diagnosis not present

## 2022-03-14 DIAGNOSIS — F3289 Other specified depressive episodes: Secondary | ICD-10-CM

## 2022-03-14 MED ORDER — BUPROPION HCL ER (SR) 150 MG PO TB12
150.0000 mg | ORAL_TABLET | Freq: Every day | ORAL | 0 refills | Status: DC
  Filled 2022-03-14: qty 30, 30d supply, fill #0

## 2022-03-14 MED ORDER — METFORMIN HCL 500 MG PO TABS
500.0000 mg | ORAL_TABLET | Freq: Two times a day (BID) | ORAL | 0 refills | Status: DC
  Filled 2022-03-14: qty 60, 30d supply, fill #0

## 2022-03-14 MED ORDER — SEMAGLUTIDE (1 MG/DOSE) 4 MG/3ML ~~LOC~~ SOPN
1.0000 mg | PEN_INJECTOR | SUBCUTANEOUS | 0 refills | Status: DC
  Filled 2022-03-14 – 2022-03-21 (×2): qty 3, 28d supply, fill #0

## 2022-03-14 MED ORDER — VITAMIN D (ERGOCALCIFEROL) 1.25 MG (50000 UNIT) PO CAPS
50000.0000 [IU] | ORAL_CAPSULE | ORAL | 0 refills | Status: DC
  Filled 2022-03-14: qty 4, 28d supply, fill #0

## 2022-03-15 ENCOUNTER — Other Ambulatory Visit (HOSPITAL_COMMUNITY): Payer: Self-pay

## 2022-03-21 ENCOUNTER — Other Ambulatory Visit: Payer: Self-pay

## 2022-03-21 ENCOUNTER — Other Ambulatory Visit (HOSPITAL_COMMUNITY): Payer: Self-pay

## 2022-03-23 ENCOUNTER — Ambulatory Visit: Payer: Commercial Managed Care - HMO | Attending: Internal Medicine

## 2022-03-23 DIAGNOSIS — Z5181 Encounter for therapeutic drug level monitoring: Secondary | ICD-10-CM

## 2022-03-23 DIAGNOSIS — Z7901 Long term (current) use of anticoagulants: Secondary | ICD-10-CM | POA: Diagnosis not present

## 2022-03-23 LAB — POCT INR: INR: 1.9 — AB (ref 2.0–3.0)

## 2022-03-23 NOTE — Patient Instructions (Signed)
TAKE 2 TABLETS TODAY ONLY THEN Continue taking 1.5 tablets daily. Recheck INR in 4 weeks. Coumadin Clinic [phone]

## 2022-03-27 ENCOUNTER — Ambulatory Visit: Payer: Self-pay

## 2022-04-02 ENCOUNTER — Other Ambulatory Visit: Payer: Self-pay

## 2022-04-03 ENCOUNTER — Encounter: Payer: Self-pay | Admitting: Cardiovascular Disease

## 2022-04-03 ENCOUNTER — Encounter: Payer: Self-pay | Admitting: Family Medicine

## 2022-04-03 ENCOUNTER — Ambulatory Visit (INDEPENDENT_AMBULATORY_CARE_PROVIDER_SITE_OTHER): Payer: Commercial Managed Care - HMO | Admitting: Family Medicine

## 2022-04-03 ENCOUNTER — Ambulatory Visit: Payer: Commercial Managed Care - HMO | Attending: Cardiovascular Disease | Admitting: Cardiovascular Disease

## 2022-04-03 ENCOUNTER — Telehealth: Payer: Self-pay | Admitting: Family Medicine

## 2022-04-03 ENCOUNTER — Other Ambulatory Visit (HOSPITAL_COMMUNITY): Payer: Self-pay

## 2022-04-03 ENCOUNTER — Telehealth: Payer: Self-pay | Admitting: Pharmacist

## 2022-04-03 VITALS — BP 117/78 | HR 82 | Temp 98.1°F | Resp 16 | Wt 383.2 lb

## 2022-04-03 VITALS — BP 106/68 | HR 80 | Ht 64.0 in | Wt 380.2 lb

## 2022-04-03 DIAGNOSIS — I5189 Other ill-defined heart diseases: Secondary | ICD-10-CM

## 2022-04-03 DIAGNOSIS — I10 Essential (primary) hypertension: Secondary | ICD-10-CM | POA: Diagnosis not present

## 2022-04-03 DIAGNOSIS — Z23 Encounter for immunization: Secondary | ICD-10-CM | POA: Diagnosis not present

## 2022-04-03 DIAGNOSIS — Z01818 Encounter for other preprocedural examination: Secondary | ICD-10-CM | POA: Diagnosis not present

## 2022-04-03 DIAGNOSIS — Z86711 Personal history of pulmonary embolism: Secondary | ICD-10-CM

## 2022-04-03 DIAGNOSIS — E1169 Type 2 diabetes mellitus with other specified complication: Secondary | ICD-10-CM

## 2022-04-03 DIAGNOSIS — Z6841 Body Mass Index (BMI) 40.0 and over, adult: Secondary | ICD-10-CM

## 2022-04-03 DIAGNOSIS — Z7901 Long term (current) use of anticoagulants: Secondary | ICD-10-CM

## 2022-04-03 DIAGNOSIS — I2699 Other pulmonary embolism without acute cor pulmonale: Secondary | ICD-10-CM

## 2022-04-03 MED ORDER — WARFARIN SODIUM 7.5 MG PO TABS: 7.5000 mg | ORAL_TABLET | Freq: Every day | ORAL | 1 refills | Status: DC

## 2022-04-03 MED ORDER — DIAZEPAM 10 MG PO TABS
ORAL_TABLET | ORAL | 0 refills | Status: DC
  Filled 2022-04-03: qty 2, 1d supply, fill #0

## 2022-04-03 NOTE — Assessment & Plan Note (Signed)
I sent her to [facility] and as result has lost over 100 pounds.

## 2022-04-03 NOTE — Telephone Encounter (Signed)
-----   Message from Beatrix Fetters, RN sent at 04/03/2022 10:46 AM EST ----- Regarding: refill Hey,   Pt is in the office to day for visit and requests a refill of her coumadin.   Thank you! Rexanne Mano

## 2022-04-03 NOTE — Patient Instructions (Signed)
Medication Instructions:  Your physician recommends that you continue on your current medications as directed. Please refer to the Current Medication list given to you today.  *If you need a refill on your cardiac medications before your next appointment, please call your pharmacy*   Follow-Up: At [facility], you and your health needs are our priority.  As part of our continuing mission to provide you with exceptional heart care, we have created designated Provider Care Teams.  These Care Teams include your primary Cardiologist (physician) and Advanced Practice Providers (APPs -  Physician Assistants and Nurse Practitioners) who all work together to provide you with the care you need, when you need it.  We recommend signing up for the patient portal called "MyChart".  Sign up information is provided on this After Visit Summary.  MyChart is used to connect with patients for Virtual Visits (Telemedicine).  Patients are able to view lab/test results, encounter notes, upcoming appointments, etc.  Non-urgent messages can be sent to your provider as well.   To learn more about what you can do with MyChart, go to [email or web address].    Your next appointment:   12 month(s)  The format for your next appointment:   In Person  Provider:   Jonathan Berry, MD   

## 2022-04-03 NOTE — Assessment & Plan Note (Signed)
History of essential hypertension with blood pressure measured today at 106/68.  She is currently not on antihypertensive medications.

## 2022-04-03 NOTE — Assessment & Plan Note (Signed)
Of diastolic heart failure by echo 03/28/2020 on oral diuretics.

## 2022-04-03 NOTE — Progress Notes (Signed)
Chief Complaint:   OBESITY Alexa [name] is here to discuss her progress with her obesity treatment plan along with follow-up of her obesity related diagnoses. Alexa [name] is on the Category 3 Plan and states she is following her eating plan approximately 90% of the time. Alexa [name] states she is walking 3 times per week.   Today's visit was #: 25 Starting weight: 434 lbs Starting date: 06/22/2020 Today's weight: 375 lbs Today's date: 03/14/2022 Total lbs lost to date: 59 Total lbs lost since last in-office visit: 0  Interim History: Alexa [name] has been struggling with his carbohydrate cravings, and she has not been meeting her protein goals which is likely worsening her hunger.  Subjective:   1. Vitamin D deficiency Alexa [name] is on vitamin D, and she is not missing doses.  No side effects were noted.  2. Type 2 diabetes mellitus with other specified complication, unspecified whether long term insulin use (HCC) Alexa [name] is on metformin and Ozempic, but she has had difficulty with getting her Ozempic for the last month and her polyphagia has worsened.  3. Emotinal Eating Behavior Alexa [name] is working on decreasing emotional eating behaviors, but she has struggled more recently.  Assessment/Plan:   1. Vitamin D deficiency We will refill prescription Vitamin D for 1 month. Alexa [name] will follow-up for routine testing of Vitamin D, at least 2-3 times per year to avoid over-replacement.  - Vitamin D, Ergocalciferol, (DRISDOL) 1.25 MG (50000 UNIT) CAPS capsule; Take 1 capsule (50,000 Units total) by mouth every 7 (seven) days.  Dispense: 4 capsule; Refill: 0  2. Type 2 diabetes mellitus with other specified complication, unspecified whether long term insulin use (HCC) Alexa [name] will continue her medications, and we will refill metformin and Ozempic for 1 month.  She will contact her insurance company to see what the problem may be.  - Semaglutide, 1 MG/DOSE, 4 MG/3ML SOPN; Inject 1 mg into the skin once a week.   Dispense: 3 mL; Refill: 0 - metFORMIN (GLUCOPHAGE) 500 MG tablet; Take 1 tablet (500 mg total) by mouth 2 (two) times daily with a meal.  Dispense: 60 tablet; Refill: 0  3. Emotinal Eating Behavior Alexa [name] will continue Wellbutrin SR 150 mg once daily, and we will refill for 1 month.  - buPROPion (WELLBUTRIN SR) 150 MG 12 hr tablet; Take 1 tablet (150 mg total) by mouth daily.  Dispense: 30 tablet; Refill: 0  4. Obesity with current BMI of 64.4 Alexa [name] is currently in the action stage of change. As such, her goal is to continue with weight loss efforts. She has agreed to the Category 3 Plan.   Exercise goals: For substantial health benefits, adults should do at least 150 minutes (2 hours and 30 minutes) a week of moderate-intensity, or 75 minutes (1 hour and 15 minutes) a week of vigorous-intensity aerobic physical activity, or an equivalent combination of moderate- and vigorous-intensity aerobic activity. Aerobic activity should be performed in episodes of at least 10 minutes, and preferably, it should be spread throughout the week.  Behavioral modification strategies: increasing lean protein intake.  Alexa [name] has agreed to follow-up with our clinic in 4 weeks. She was informed of the importance of frequent follow-up visits to maximize her success with intensive lifestyle modifications for her multiple health conditions.   Objective:   Blood pressure 122/76, pulse 82, temperature 97.9 F (36.6 C), height '5\' 4"'$  (1.626 m), weight (!) 375 lb (170.1 kg), SpO2 98 %. Body mass index is 64.37 kg/m.  General: Cooperative, alert, well  developed, in no acute distress. HEENT: Conjunctivae and lids unremarkable. Cardiovascular: Regular rhythm.  Lungs: Normal work of breathing. Neurologic: No focal deficits.   Lab Results  Component Value Date   CREATININE 0.87 11/06/2021   BUN 11 11/06/2021   NA 138 11/06/2021   K 4.6 11/06/2021   CL 99 11/06/2021   CO2 22 11/06/2021   Lab Results  Component  Value Date   ALT 12 11/06/2021   AST 17 11/06/2021   ALKPHOS 54 11/06/2021   BILITOT 0.4 11/06/2021   Lab Results  Component Value Date   HGBA1C 5.8 (H) 10/11/2021   HGBA1C 5.6 02/08/2021   HGBA1C 6.2 (H) 11/08/2020   HGBA1C 6.0 (H) 06/22/2020   HGBA1C 5.8 (H) 03/02/2020   Lab Results  Component Value Date   INSULIN 17.2 10/11/2021   INSULIN 17.6 02/08/2021   INSULIN 3.9 11/08/2020   INSULIN 20.3 06/22/2020   Lab Results  Component Value Date   TSH 0.964 11/06/2021   Lab Results  Component Value Date   CHOL 150 11/06/2021   HDL 39 (L) 11/06/2021   LDLCALC 93 11/06/2021   LDLDIRECT 100 10/08/2015   TRIG 94 11/06/2021   CHOLHDL 3.8 11/06/2021   Lab Results  Component Value Date   VD25OH 23.5 (L) 10/11/2021   VD25OH 42.6 02/08/2021   VD25OH 28.1 (L) 11/08/2020   Lab Results  Component Value Date   WBC 6.5 11/06/2021   HGB 12.5 11/06/2021   HCT 35.2 11/06/2021   MCV 98 (H) 11/06/2021   PLT 372 11/06/2021   Lab Results  Component Value Date   IRON 38 (L) 10/08/2015   TIBC 416 10/08/2015   FERRITIN 27 10/08/2015   Attestation Statements:   Reviewed by clinician on day of visit: allergies, medications, problem list, medical history, surgical history, family history, social history, and previous encounter notes.  I have personally spent 42 minutes total time today in preparation, patient care, and documentation for this visit, including the following: review of clinical lab tests; review of medical tests/procedures/services.   I, Trixie Dredge, am acting as transcriptionist for Dennard Nip, MD.  I have reviewed the above documentation for accuracy and completeness, and I agree with the above. -  Dennard Nip, MD

## 2022-04-03 NOTE — Assessment & Plan Note (Signed)
History of submassive PE February 2018 on Coumadin anticoagulation.

## 2022-04-03 NOTE — Progress Notes (Unsigned)
04/03/2022 Alexa [name]   Aug 05, 1975  [id number]  Primary Physician Dorna Mai, MD Primary Cardiologist: Lorretta Harp MD Lupe Carney, [place]  HPI:  Alexa [name] is a 47 y.o.  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  I last saw her in the office 03/08/2020.Marland Kitchen  She was hospitalized for acute on chronic right-sided heart failure with volume overload and was diuresed.  She did have a history of submassive pulmonary embolus February 2018 currently on Coumadin anticoagulation.  Other problems include diabetes and hypertension.  She walks with a cane.  Her edema is about at baseline.   Since I saw her 2 years ago she continues to do well.  She has lost 100 pounds after being referred to the [facility] diet wellness center.  She remains on Coumadin oral anticoagulation for her submassive PE 5 years ago.   Current Meds  Medication Sig   acetaminophen (TYLENOL) 500 MG tablet Take 1,000 mg by mouth daily as needed for mild pain.   blood glucose meter kit and supplies KIT Use up to four times daily as directed.   buPROPion (WELLBUTRIN SR) 150 MG 12 hr tablet Take 1 tablet (150 mg total) by mouth daily.   cetirizine (ZYRTEC) 10 MG tablet Take 1 tablet (10 mg total) by mouth daily.   cyanocobalamin (VITAMIN B12) 500 MCG tablet [phone] mcg once daily   furosemide (LASIX) 40 MG tablet Take 1 tablet (40 mg total) by mouth 2 (two) times daily.   gabapentin (NEURONTIN) 300 MG capsule Take 1 capsule (300 mg total) by mouth 2 (two) times daily.   glucose blood test strip ascensia  Meter/strips preferred Test 3x daily Use as instructed Dx.dm   Lancets (ONETOUCH ULTRASOFT) lancets Test daily Use as instructed dm2   metFORMIN (GLUCOPHAGE) 500 MG tablet Take 1 tablet (500 mg total) by mouth 2 (two) times daily with a meal.   Misc. Devices MISC Bariatric size shower chair with handle 423 lbs, BMI 72  Dx. Pulmonary  HTN, DM2 with neuropathy, morbid obesity   potassium chloride (KLOR-CON M) 10 MEQ tablet Take 1 tablet (10 mEq total) by mouth 2 (two) times daily.   Semaglutide, 1 MG/DOSE, 4 MG/3ML SOPN Inject 1 mg into the skin once a week.   Vitamin D, Ergocalciferol, (DRISDOL) 1.25 MG (50000 UNIT) CAPS capsule Take 1 capsule (50,000 Units total) by mouth every 7 (seven) days.   warfarin (COUMADIN) 7.5 MG tablet Take 1-1.5 tablets (7.5-11.25 mg total) by mouth daily as directed by Coumadin clinic     Allergies  Allergen Reactions   Penicillins Hives    Has patient had a PCN reaction causing immediate rash, facial/tongue/throat swelling, SOB or lightheadedness with hypotension: No Has patient had a PCN reaction causing severe rash involving mucus membranes or skin necrosis: No Has patient had a PCN reaction that required hospitalization No Has patient had a PCN reaction occurring within the last 10 years: No If all of the above answers are "NO", then may proceed with Cephalosporin use.    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   Ex-husband lives locally.   Social Determinants of Health   Financial Resource Strain: Not on file  Food Insecurity: Not on file   Transportation Needs: Not on file  Physical Activity: Not on file  Stress: Not on file  Social Connections: Not on file  Intimate Partner Violence: Not on file     Review of Systems: General: negative for chills, fever, night sweats or weight changes.  Cardiovascular: negative for chest pain, dyspnea on exertion, edema, orthopnea, palpitations, paroxysmal nocturnal dyspnea or shortness of breath Dermatological: negative for rash Respiratory: negative for cough or wheezing Urologic: negative for hematuria Abdominal: negative for nausea, vomiting, diarrhea, bright red blood per rectum, melena, or hematemesis Neurologic: negative for visual changes, syncope, or dizziness All other systems reviewed and are otherwise negative except as noted above.    Blood pressure 106/68, pulse 80, height '5\' 4"'$  (1.626 m), weight (!) 380 lb 3.2 oz (172.5 kg), SpO2 96 %.  General appearance: alert and no distress Neck: no adenopathy, no carotid bruit, no JVD, supple, symmetrical, trachea midline, and thyroid not enlarged, symmetric, no tenderness/mass/nodules Lungs: clear to auscultation bilaterally Heart: regular rate and rhythm, S1, S2 normal, no murmur, click, rub or gallop Extremities: extremities normal, atraumatic, no cyanosis or edema Pulses: 2+ and symmetric Skin: Skin color, texture, turgor normal. No rashes or lesions Neurologic: Grossly normal  EKG sinus rhythm at 80 without ST or T wave changes.  Personally reviewed this EKG.  ASSESSMENT AND PLAN:   Benign essential HTN History of essential hypertension with blood pressure measured today at 106/68.  She is currently not on antihypertensive medications.  History of pulmonary embolism History of submassive PE February 2018 on Coumadin anticoagulation.  BMI 70 and over, adult [facility]) I sent her to [facility] diet wellness center and as result has lost over 100 pounds.  Diastolic dysfunction Of diastolic heart failure by echo 03/28/2020 on oral  diuretics.     Lorretta Harp MD FACP,FACC,FAHA, [facility] 04/03/2022 10:59 AM

## 2022-04-04 LAB — MICROALBUMIN / CREATININE URINE RATIO
Creatinine, Urine: 262.4 mg/dL
Microalb/Creat Ratio: 7 mg/g creat (ref 0–29)
Microalbumin, Urine: 19.3 ug/mL

## 2022-04-04 LAB — CBC WITH DIFFERENTIAL/PLATELET

## 2022-04-04 LAB — BASIC METABOLIC PANEL
BUN/Creatinine Ratio: 12 (ref 9–23)
BUN: 9 mg/dL (ref 6–24)
CO2: 25 mmol/L (ref 20–29)
Calcium: 8.7 mg/dL (ref 8.7–10.2)
Chloride: 103 mmol/L (ref 96–106)
Creatinine, Ser: 0.77 mg/dL (ref 0.57–1.00)
Glucose: 97 mg/dL (ref 70–99)
Potassium: 3.9 mmol/L (ref 3.5–5.2)
Sodium: 139 mmol/L (ref 134–144)
eGFR: 97 mL/min/{1.73_m2} (ref >59)

## 2022-04-04 NOTE — Progress Notes (Signed)
New Patient Office Visit  Subjective    Patient ID: Alexa [name], female    DOB: 11-Apr-1975  Age: 47 y.o. MRN: [id number]  CC:  Chief Complaint  Patient presents with  . dental form    HPI [name] presents to establish care ***  Outpatient Encounter Medications as of 04/03/2022  Medication Sig  . acetaminophen (TYLENOL) 500 MG tablet Take 1,000 mg by mouth daily as needed for mild pain.  . blood glucose meter kit and supplies KIT Use up to four times daily as directed.  Marland Kitchen buPROPion (WELLBUTRIN SR) 150 MG 12 hr tablet Take 1 tablet (150 mg total) by mouth daily.  . cetirizine (ZYRTEC) 10 MG tablet Take 1 tablet (10 mg total) by mouth daily.  . cyanocobalamin (VITAMIN B12) 500 MCG tablet [phone] mcg once daily  . diazepam (VALIUM) 10 MG tablet Take 1 by mouth 1/2-1 hour before procedure. May repeat in 2-4 hours if necessary.  . furosemide (LASIX) 40 MG tablet Take 1 tablet (40 mg total) by mouth 2 (two) times daily.  Marland Kitchen gabapentin (NEURONTIN) 300 MG capsule Take 1 capsule (300 mg total) by mouth 2 (two) times daily.  Marland Kitchen glucose blood test strip ascensia  Meter/strips preferred Test 3x daily Use as instructed Dx.dm  . Lancets (ONETOUCH ULTRASOFT) lancets Test daily Use as instructed dm2  . metFORMIN (GLUCOPHAGE) 500 MG tablet Take 1 tablet (500 mg total) by mouth 2 (two) times daily with a meal.  . Misc. Devices MISC Bariatric size shower chair with handle 423 lbs, BMI 72  Dx. Pulmonary HTN, DM2 with neuropathy, morbid obesity  . potassium chloride (KLOR-CON M) 10 MEQ tablet Take 1 tablet (10 mEq total) by mouth 2 (two) times daily.  . Semaglutide, 1 MG/DOSE, 4 MG/3ML SOPN Inject 1 mg into the skin once a week.  . Vitamin D, Ergocalciferol, (DRISDOL) 1.25 MG (50000 UNIT) CAPS capsule Take 1 capsule (50,000 Units total) by mouth every 7 (seven) days.  Marland Kitchen warfarin (COUMADIN) 7.5 MG tablet Take 1 - 1 and 1/2 tablets (7.5 - 11.25 mg total) by mouth daily as directed by  Coumadin clinic   No facility-administered encounter medications on file as of 04/03/2022.    Past Medical History:  Diagnosis Date  . Anemia   . Back pain   . Bilateral swelling of feet   . CHF (congestive heart failure) ([place])   . Diabetes mellitus without complication ([place])    Phreesia 03/02/2020  . DM2 (diabetes mellitus, type 2) ([place])   . Hypertension   . Joint pain   . Nocturnal hypoxia   . Obstructive sleep apnea syndrome, moderate   . Pulmonary embolism ([place]) 05/14/2016   submassive, on chronic anticoagulation  . Pulmonary hypertension ([place])   . Vitamin D deficiency     Past Surgical History:  Procedure Laterality Date  . CESAREAN SECTION      Family History  Problem Relation Age of Onset  . Diabetes Maternal Grandmother   . Breast cancer Maternal Grandmother        breast cancer   . Brain cancer Maternal Grandmother   . Hypertension Mother   . Obesity Mother   . Heart disease Father   . Obesity Father   . Breast cancer Maternal Aunt 2  . Breast cancer Maternal Aunt 57    Social History   Socioeconomic History  . Marital status: Divorced    Spouse name: n/a  . Number of children: 2  . Years  of education: Not on file  . Highest education level: Not on file  Occupational History  . 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 Smoking status: Former    Types: Cigarettes  . Smokeless tobacco: Never  . Tobacco comments:    only smokes when she drinks alcohol - 1 pack per week  Vaping Use  . Vaping Use: Never used  Substance and Sexual Activity  . Alcohol use: Yes    Alcohol/week: 4.0 - 6.0 standard drinks of alcohol    Types: 4 - 6 Standard drinks or equivalent per week    Comment: mixed drinks 3-4 times/weeks  . Drug use: No  . Sexual activity: Not Currently  Other Topics Concern  . Not on file  Social History Narrative   Her children live with her.   Ex-husband lives locally.   Social Determinants of Health   Financial Resource Strain:  Not on file  Food Insecurity: Not on file  Transportation Needs: Not on file  Physical Activity: Not on file  Stress: Not on file  Social Connections: Not on file  Intimate Partner Violence: Not on file    ROS      Objective    BP 117/78   Pulse 82   Temp 98.1 F (36.7 C) (Oral)   Resp 16   Wt (!) 383 lb 3.2 oz (173.8 kg)   SpO2 95%   BMI 65.78 kg/m   Physical Exam  {Labs (Optional):23779}    Assessment & Plan:   Problem List Items Addressed This Visit       Endocrine   Type 2 diabetes mellitus with other specified complication ([place])   Relevant Orders   POCT glycosylated hemoglobin (Hb A1C)   Microalbumin / creatinine urine ratio (Completed)   Other Visit Diagnoses     Pre-op evaluation    -  Primary   Relevant Orders   Basic Metabolic Panel (Completed)   CBC with Differential (Completed)   Need for immunization against influenza       Relevant Orders   Flu Vaccine QUAD 37moIM (Fluarix, Fluzone & Alfiuria Quad PF) (Completed)       No follow-ups on file.   WBecky Sax MD

## 2022-04-05 ENCOUNTER — Encounter: Payer: Self-pay | Admitting: Family Medicine

## 2022-04-11 ENCOUNTER — Encounter (INDEPENDENT_AMBULATORY_CARE_PROVIDER_SITE_OTHER): Payer: Self-pay | Admitting: Physician Assistant

## 2022-04-11 ENCOUNTER — Other Ambulatory Visit (HOSPITAL_COMMUNITY): Payer: Self-pay

## 2022-04-11 ENCOUNTER — Ambulatory Visit (INDEPENDENT_AMBULATORY_CARE_PROVIDER_SITE_OTHER): Payer: Commercial Managed Care - HMO | Admitting: Physician Assistant

## 2022-04-11 ENCOUNTER — Encounter (HOSPITAL_COMMUNITY): Payer: Self-pay

## 2022-04-11 ENCOUNTER — Other Ambulatory Visit: Payer: Self-pay

## 2022-04-11 VITALS — BP 134/84 | HR 77 | Temp 98.0°F | Ht 64.0 in | Wt 379.0 lb

## 2022-04-11 DIAGNOSIS — E1169 Type 2 diabetes mellitus with other specified complication: Secondary | ICD-10-CM | POA: Diagnosis not present

## 2022-04-11 DIAGNOSIS — E538 Deficiency of other specified B group vitamins: Secondary | ICD-10-CM | POA: Diagnosis not present

## 2022-04-11 DIAGNOSIS — E559 Vitamin D deficiency, unspecified: Secondary | ICD-10-CM | POA: Diagnosis not present

## 2022-04-11 DIAGNOSIS — Z6841 Body Mass Index (BMI) 40.0 and over, adult: Secondary | ICD-10-CM

## 2022-04-11 DIAGNOSIS — R5383 Other fatigue: Secondary | ICD-10-CM | POA: Diagnosis not present

## 2022-04-11 DIAGNOSIS — R600 Localized edema: Secondary | ICD-10-CM

## 2022-04-11 DIAGNOSIS — Z7984 Long term (current) use of oral hypoglycemic drugs: Secondary | ICD-10-CM

## 2022-04-11 DIAGNOSIS — E669 Obesity, unspecified: Secondary | ICD-10-CM

## 2022-04-11 DIAGNOSIS — F3289 Other specified depressive episodes: Secondary | ICD-10-CM

## 2022-04-11 MED ORDER — BUPROPION HCL ER (SR) 150 MG PO TB12
150.0000 mg | ORAL_TABLET | Freq: Every day | ORAL | 0 refills | Status: DC
  Filled 2022-04-11: qty 30, 30d supply, fill #0

## 2022-04-11 MED ORDER — VITAMIN D (ERGOCALCIFEROL) 1.25 MG (50000 UNIT) PO CAPS
50000.0000 [IU] | ORAL_CAPSULE | ORAL | 0 refills | Status: DC
  Filled 2022-04-11: qty 4, 28d supply, fill #0

## 2022-04-11 MED ORDER — CYANOCOBALAMIN 500 MCG PO TABS: ORAL_TABLET | ORAL | Status: DC

## 2022-04-11 MED ORDER — TIRZEPATIDE 2.5 MG/0.5ML ~~LOC~~ SOAJ
2.5000 mg | SUBCUTANEOUS | 0 refills | Status: DC
  Filled 2022-04-11 – 2022-04-26 (×3): qty 2, 28d supply, fill #0

## 2022-04-11 MED ORDER — METFORMIN HCL 500 MG PO TABS
500.0000 mg | ORAL_TABLET | Freq: Two times a day (BID) | ORAL | 0 refills | Status: DC
  Filled 2022-04-11: qty 60, 30d supply, fill #0

## 2022-04-11 NOTE — Telephone Encounter (Unsigned)
Copied from [place] [id number]. Topic: General - Inquiry >> Apr 10, 2022  2:25 PM Penni Bombard wrote: Reason for CRM: pt called asking if the papers for her dental surgery has been faxed to the dental office.  Marlana Salvage was in last Tuesday. Fax# [phone]  [phone] >> Apr 11, 2022 10:07 AM Cyndi Bender wrote: Shela Leff with [facility] requests return call regarding the paperwork for dental surgery. Danni requests call back asap to advise. Cb# [phone]

## 2022-04-12 LAB — CBC WITH DIFFERENTIAL/PLATELET
Basophils Absolute: 0 10*3/uL (ref 0.0–0.2)
Basos: 0 %
EOS (ABSOLUTE): 0 10*3/uL (ref 0.0–0.4)
Eos: 0 %
Hematocrit: 32.9 % — ABNORMAL LOW (ref 34.0–46.6)
Hemoglobin: 11.8 g/dL (ref 11.1–15.9)
Immature Grans (Abs): 0 10*3/uL (ref 0.0–0.1)
Immature Granulocytes: 0 %
Lymphocytes Absolute: 1.8 10*3/uL (ref 0.7–3.1)
Lymphs: 36 %
MCH: 33.2 pg — ABNORMAL HIGH (ref 26.6–33.0)
MCHC: 35.9 g/dL — ABNORMAL HIGH (ref 31.5–35.7)
MCV: 93 fL (ref 79–97)
Monocytes Absolute: 0.4 10*3/uL (ref 0.1–0.9)
Monocytes: 9 %
Neutrophils Absolute: 2.7 10*3/uL (ref 1.4–7.0)
Neutrophils: 55 %
Platelets: 414 10*3/uL (ref 150–450)
RBC: 3.55 x10E6/uL — ABNORMAL LOW (ref 3.77–5.28)
RDW: 14.2 % (ref 11.7–15.4)
WBC: 4.9 10*3/uL (ref 3.4–10.8)

## 2022-04-12 LAB — CMP14+EGFR
ALT: 9 IU/L (ref 0–32)
AST: 15 IU/L (ref 0–40)
Albumin/Globulin Ratio: 1.2 (ref 1.2–2.2)
Albumin: 3.9 g/dL (ref 3.9–4.9)
Alkaline Phosphatase: 55 IU/L (ref 44–121)
BUN/Creatinine Ratio: 12 (ref 9–23)
BUN: 9 mg/dL (ref 6–24)
Bilirubin Total: 0.2 mg/dL (ref 0.0–1.2)
CO2: 22 mmol/L (ref 20–29)
Calcium: 8.7 mg/dL (ref 8.7–10.2)
Chloride: 104 mmol/L (ref 96–106)
Creatinine, Ser: 0.76 mg/dL (ref 0.57–1.00)
Globulin, Total: 3.3 g/dL (ref 1.5–4.5)
Glucose: 83 mg/dL (ref 70–99)
Potassium: 4.5 mmol/L (ref 3.5–5.2)
Sodium: 139 mmol/L (ref 134–144)
Total Protein: 7.2 g/dL (ref 6.0–8.5)
eGFR: 99 mL/min/{1.73_m2} (ref >59)

## 2022-04-12 LAB — TSH: TSH: 1.49 u[IU]/mL (ref 0.450–4.500)

## 2022-04-12 LAB — VITAMIN B12: Vitamin B-12: 188 pg/mL — ABNORMAL LOW (ref 232–1245)

## 2022-04-12 LAB — HEMOGLOBIN A1C
Est. average glucose Bld gHb Est-mCnc: 114 mg/dL
Hgb A1c MFr Bld: 5.6 % (ref 4.8–5.6)

## 2022-04-12 LAB — LIPID PANEL WITH LDL/HDL RATIO
Cholesterol, Total: 168 mg/dL (ref 100–199)
HDL: 43 mg/dL (ref >39)
LDL Chol Calc (NIH): 102 mg/dL — ABNORMAL HIGH (ref 0–99)
LDL/HDL Ratio: 2.4 ratio (ref 0.0–3.2)
Triglycerides: 131 mg/dL (ref 0–149)
VLDL Cholesterol Cal: 23 mg/dL (ref 5–40)

## 2022-04-12 LAB — VITAMIN D 25 HYDROXY (VIT D DEFICIENCY, FRACTURES): Vit D, 25-Hydroxy: 22.9 ng/mL — ABNORMAL LOW (ref 30.0–100.0)

## 2022-04-12 LAB — INSULIN, RANDOM: INSULIN: 16 u[IU]/mL (ref 2.6–24.9)

## 2022-04-17 ENCOUNTER — Other Ambulatory Visit: Payer: Self-pay | Admitting: Family Medicine

## 2022-04-17 DIAGNOSIS — R600 Localized edema: Secondary | ICD-10-CM

## 2022-04-17 DIAGNOSIS — G5793 Unspecified mononeuropathy of bilateral lower limbs: Secondary | ICD-10-CM

## 2022-04-18 ENCOUNTER — Other Ambulatory Visit (HOSPITAL_COMMUNITY): Payer: Self-pay

## 2022-04-18 ENCOUNTER — Other Ambulatory Visit: Payer: Self-pay

## 2022-04-18 MED ORDER — FUROSEMIDE 40 MG PO TABS
40.0000 mg | ORAL_TABLET | Freq: Two times a day (BID) | ORAL | 0 refills | Status: DC
  Filled 2022-04-18: qty 180, 90d supply, fill #0

## 2022-04-18 MED ORDER — GABAPENTIN 300 MG PO CAPS
300.0000 mg | ORAL_CAPSULE | Freq: Two times a day (BID) | ORAL | 0 refills | Status: DC
  Filled 2022-04-18: qty 180, 90d supply, fill #0

## 2022-04-18 NOTE — Telephone Encounter (Signed)
Requested medication (s) are due for refill today: yes  Requested medication (s) are on the active medication list: yes  Last refill:  01/16/22 #180 0 refills  Future visit scheduled: yes in 5 months  Notes to clinic:  no refills remain. Do you want to refill Rx?     Requested Prescriptions  Pending Prescriptions Disp Refills   gabapentin (NEURONTIN) 300 MG capsule 180 capsule 0    Sig: Take 1 capsule (300 mg total) by mouth 2 (two) times daily.     Neurology: Anticonvulsants - gabapentin Passed - 04/17/2022  6:50 PM      Passed - Cr in normal range and within 360 days    Creatinine  Date Value Ref Range Status  05/24/2017 0.84 0.60 - 1.10 mg/dL Final   Creat  Date Value Ref Range Status  01/28/2016 0.90 0.50 - 1.10 mg/dL Final   Creatinine, Ser  Date Value Ref Range Status  04/11/2022 0.76 0.57 - 1.00 mg/dL Final   Creatinine, Urine  Date Value Ref Range Status  05/14/2016 144.43 mg/dL Final         Passed - Completed PHQ-2 or PHQ-9 in the last 360 days      Passed - Valid encounter within last 12 months    Recent Outpatient Visits           2 weeks ago Pre-op evaluation   [place] [facility], MD   5 months ago Annual physical exam   [place] [facility], MD   1 year ago Essential hypertension   [place] [facility], MD   1 year ago Generalized anxiety disorder   [place] [facility] at [facility], Clyde Canterbury, MD   1 year ago Type 2 diabetes mellitus with other specified complication, without long-term current use of insulin [facility])   [place] [facility] at [facility], Clyde Canterbury, MD       Future Appointments             In 5 months Dorna Mai, MD [facility]             furosemide (LASIX) 40 MG tablet 180 tablet 0    Sig: Take 1 tablet (40 mg total) by mouth 2 (two) times daily.      Cardiovascular:  Diuretics - Loop Failed - 04/17/2022  6:50 PM      Failed - Mg Level in normal range and within 180 days    Magnesium  Date Value Ref Range Status  05/15/2016 2.0 1.7 - 2.4 mg/dL Final         Passed - K in normal range and within 180 days    Potassium  Date Value Ref Range Status  04/11/2022 4.5 3.5 - 5.2 mmol/L Final         Passed - Ca in normal range and within 180 days    Calcium  Date Value Ref Range Status  04/11/2022 8.7 8.7 - 10.2 mg/dL Final         Passed - Na in normal range and within 180 days    Sodium  Date Value Ref Range Status  04/11/2022 139 134 - 144 mmol/L Final         Passed - Cr in normal range and within 180 days    Creatinine  Date Value Ref Range Status  05/24/2017 0.84 0.60 -  1.10 mg/dL Final   Creat  Date Value Ref Range Status  01/28/2016 0.90 0.50 - 1.10 mg/dL Final   Creatinine, Ser  Date Value Ref Range Status  04/11/2022 0.76 0.57 - 1.00 mg/dL Final   Creatinine, Urine  Date Value Ref Range Status  05/14/2016 144.43 mg/dL Final         Passed - Cl in normal range and within 180 days    Chloride  Date Value Ref Range Status  04/11/2022 104 96 - 106 mmol/L Final         Passed - Last BP in normal range    BP Readings from Last 1 Encounters:  04/11/22 134/84         Passed - Valid encounter within last 6 months    Recent Outpatient Visits           2 weeks ago Pre-op evaluation   [place] [facility], MD   5 months ago Annual physical exam   [place] [facility], MD   1 year ago Essential hypertension   [place] [facility], MD   1 year ago Generalized anxiety disorder   [place] [facility] at [facility], Clyde Canterbury, MD   1 year ago Type 2 diabetes mellitus with other specified complication, without long-term current use of insulin [facility])   [place] [facility] at [facility], MD       Future Appointments             In 5 months Dorna Mai, MD [facility]

## 2022-04-18 NOTE — Progress Notes (Unsigned)
Chief Complaint:   OBESITY Alexa [name] is here to discuss her progress with her obesity treatment plan along with follow-up of her obesity related diagnoses. Alexa [name] is on the Category 3 Plan and states she is following her eating plan approximately 90% of the time. Alexa [name] states she is walking 10-15 minutes 4 times per week.  Today's visit was #: 80 Starting weight: 434 lbs Starting date: 06/22/2020 Today's weight: 379 lbs Today's date: 04/11/2022 Total lbs lost to date: 55 lbs Total lbs lost since last in-office visit: 0  Interim History: Alexa [name] has done well with weight loss overall, and has been maintaining. She reports increased hunger over the past few months as she has been off Ozempic as her insurance is not covering.  She reports her hunger was not always controlled even with Ozempic. Took Trulicity in past, but did not help much with weight loss.  Reports increased cravings at night and eating sweets more frequently.  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   Subjective:   1. Type 2 diabetes mellitus with other specified complication, unspecified whether long term insulin use (HCC) A1c at 5.8- at goal, insulin at 17.2-not at goal.  On Metformin 500 mg BID with no side effects.  No longer on Ozempic for past 3-4 months .  She is working on decreasing simple carbs, increasing lean proteins and exercise to promote weight loss and improve glycemic control. She would benefit from GLP-1/GIP medication and we discussed starting Mounjaro to help with management of diabetes and to promote weight loss and the patient is agreeable.  Patient denies a personal or family history of pancreatitis, medullary thyroid carcinoma or multiple endocrine neoplasia type II.   2. B12 deficiency Alexa [name] notes fatigue.  B12 Level of 107-not at goal.  3. Vitamin D deficiency Alexa [name] notes fatigue.  Vitamin D Level of 23.5 on 10/11/21-not at goal.  On  ergocalciferol once weekly-Denies any side effects.  4. Fatigue, unspecified type Alexa [name] endorses fatigue. Low B12 and vitamin D likely contributing factors in fatigue.   5. Bilateral lower extremity edema Alexa [name] is taking Lasix regularly per PCP.  Feels she is retaining fluid and also urinating less.  6. Emotional Eating Behavior Alexa [name] is on Wellbutrin SR 150 mg daily.  Helps to decrease cravings.  She is using adult coloring books to decrease emotional eating behavior.  Assessment/Plan:   1. Type 2 diabetes mellitus with other specified complication, unspecified whether long term insulin use (HCC) We will obtain labs today. Continue/Refill Metformin 500 mg twice a day for 1 month with 0 refills.  Start Mounjaro 2.5 mg SQ once weekly for 1 month with 0 refills.  Continue to nutrition plan and exercise to promote weight loss and improve glycemic control.   -Refill metFORMIN (GLUCOPHAGE) 500 MG tablet; Take 1 tablet (500 mg total) by mouth 2 (two) times daily with a meal.  Dispense: 60 tablet; Refill: 0  -Start tirzepatide [facility]) 2.5 MG/0.5ML Pen; Inject 2.5 mg into the skin once a week.  Dispense: 2 mL; Refill: 0  - Hemoglobin A1c - Insulin, random - Lipid Panel With LDL/HDL Ratio  2. B12 deficiency We will obtain labs today. Continue/Refill Vitamin B12 daily for 1 month with 0 refills.    -Refill cyanocobalamin (VITAMIN B12) 500 MCG tablet; [phone] mcg once daily  - CBC with Differential/Platelet - Vitamin B12  3. Vitamin D deficiency We will obtain labs today.  Continue/Refill ergocalciferol  once a week for 1 month with 0 refills.  -Refill Vitamin D, Ergocalciferol, (DRISDOL) 1.25 MG (50000 UNIT) CAPS capsule; Take 1 capsule (50,000 Units total) by mouth every 7 (seven) days.  Dispense: 4 capsule; Refill: 0  - VITAMIN D 25 Hydroxy (Vit-D Deficiency, Fractures)  4. Fatigue, unspecified type We will obtain labs today.  Continue Prescribed Nutrition Plan and exercise to  promote weight loss and improve fatigue.  - TSH  5. Bilateral lower extremity edema We will obtain labs today.  Continue Prescribed Nutrition Plan and exercise to improve weight loss.  Follow up with PCP.  - CMP14+EGFR  6. Emotional Eating Behavior Continue/Refill Wellbutrin SR 150 mg daily for 1 month with 0 refills.  Continue Wellbutrin and strategies to decrease emotional eating behavior.  -Refill buPROPion (WELLBUTRIN SR) 150 MG 12 hr tablet; Take 1 tablet (150 mg total) by mouth daily.  Dispense: 30 tablet; Refill: 0  7. Obesity with current BMI of 65.1 Alexa [name] is currently in the action stage of change. As such, her goal is to continue with weight loss efforts. She has agreed to the Category 3 Plan.   Exercise goals: As is.  Behavioral modification strategies: increasing lean protein intake, decreasing simple carbohydrates, meal planning and cooking strategies, keeping healthy foods in the home, and emotional eating strategies.  Alexa [name] has agreed to follow-up with our clinic in 4 weeks. She was informed of the importance of frequent follow-up visits to maximize her success with intensive lifestyle modifications for her multiple health conditions.   Alexa [name] was informed we would discuss her lab results at her next visit unless there is a critical issue that needs to be addressed sooner. Alexa [name] agreed to keep her next visit at the agreed upon time to discuss these results.  Objective:   Blood pressure 134/84, pulse 77, temperature 98 F (36.7 C), height '5\' 4"'$  (1.626 m), weight (!) 379 lb (171.9 kg), SpO2 100 %. Body mass index is 65.06 kg/m.  General: Cooperative, alert, well developed, in no acute distress. HEENT: Conjunctivae and lids unremarkable. Cardiovascular: Regular rhythm.  Lungs: Normal work of breathing. Neurologic: No focal deficits.   Lab Results  Component Value Date   CREATININE 0.76 04/11/2022   BUN 9 04/11/2022   NA 139 04/11/2022   K 4.5 04/11/2022   CL  104 04/11/2022   CO2 22 04/11/2022   Lab Results  Component Value Date   ALT 9 04/11/2022   AST 15 04/11/2022   ALKPHOS 55 04/11/2022   BILITOT 0.2 04/11/2022   Lab Results  Component Value Date   HGBA1C 5.6 04/11/2022   HGBA1C 5.8 (H) 10/11/2021   HGBA1C 5.6 02/08/2021   HGBA1C 6.2 (H) 11/08/2020   HGBA1C 6.0 (H) 06/22/2020   Lab Results  Component Value Date   INSULIN 16.0 04/11/2022   INSULIN 17.2 10/11/2021   INSULIN 17.6 02/08/2021   INSULIN 3.9 11/08/2020   INSULIN 20.3 06/22/2020   Lab Results  Component Value Date   TSH 1.490 04/11/2022   Lab Results  Component Value Date   CHOL 168 04/11/2022   HDL 43 04/11/2022   LDLCALC 102 (H) 04/11/2022   LDLDIRECT 100 10/08/2015   TRIG 131 04/11/2022   CHOLHDL 3.8 11/06/2021   Lab Results  Component Value Date   VD25OH 22.9 (L) 04/11/2022   VD25OH 23.5 (L) 10/11/2021   VD25OH 42.6 02/08/2021   Lab Results  Component Value Date   WBC 4.9 04/11/2022   HGB 11.8 04/11/2022   HCT  32.9 (L) 04/11/2022   MCV 93 04/11/2022   PLT 414 04/11/2022   Lab Results  Component Value Date   IRON 38 (L) 10/08/2015   TIBC 416 10/08/2015   FERRITIN 27 10/08/2015   Attestation Statements:   Reviewed by clinician on day of visit: allergies, medications, problem list, medical history, surgical history, family history, social history, and previous encounter notes.  I, Brendell Tyus, am acting as transcriptionist for [employer], PA.  I have reviewed the above documentation for accuracy and completeness, and I agree with the above. -  Aadhira Heffernan,PA-C

## 2022-04-19 ENCOUNTER — Other Ambulatory Visit: Payer: Self-pay

## 2022-04-19 ENCOUNTER — Other Ambulatory Visit (HOSPITAL_COMMUNITY): Payer: Self-pay

## 2022-04-20 ENCOUNTER — Other Ambulatory Visit: Payer: Self-pay

## 2022-04-20 ENCOUNTER — Ambulatory Visit: Payer: Commercial Managed Care - HMO | Attending: Cardiovascular Disease

## 2022-04-20 DIAGNOSIS — I2699 Other pulmonary embolism without acute cor pulmonale: Secondary | ICD-10-CM | POA: Diagnosis not present

## 2022-04-20 DIAGNOSIS — Z7901 Long term (current) use of anticoagulants: Secondary | ICD-10-CM

## 2022-04-20 LAB — POCT INR: INR: 2.3 (ref 2.0–3.0)

## 2022-04-20 MED ORDER — WARFARIN SODIUM 7.5 MG PO TABS
7.5000 mg | ORAL_TABLET | Freq: Every day | ORAL | 0 refills | Status: DC
  Filled 2022-04-20: qty 135, 90d supply, fill #0
  Filled 2022-06-05 – 2022-07-21 (×2): qty 135, 90d supply, fill #1

## 2022-04-20 NOTE — Patient Instructions (Signed)
Description   Continue taking 1.5 tablets daily.  Recheck INR in 5 weeks.  Coumadin Clinic [phone]

## 2022-04-24 NOTE — Telephone Encounter (Signed)
error 

## 2022-04-26 ENCOUNTER — Ambulatory Visit: Payer: 59 | Admitting: Family Medicine

## 2022-04-26 ENCOUNTER — Other Ambulatory Visit: Payer: Self-pay | Admitting: Family Medicine

## 2022-04-26 DIAGNOSIS — G5793 Unspecified mononeuropathy of bilateral lower limbs: Secondary | ICD-10-CM

## 2022-04-27 ENCOUNTER — Other Ambulatory Visit: Payer: Self-pay

## 2022-05-09 ENCOUNTER — Encounter (INDEPENDENT_AMBULATORY_CARE_PROVIDER_SITE_OTHER): Payer: Self-pay | Admitting: Family Medicine

## 2022-05-09 ENCOUNTER — Ambulatory Visit (INDEPENDENT_AMBULATORY_CARE_PROVIDER_SITE_OTHER): Payer: Commercial Managed Care - HMO | Admitting: Family Medicine

## 2022-05-09 ENCOUNTER — Other Ambulatory Visit: Payer: Self-pay

## 2022-05-09 VITALS — BP 152/77 | HR 65 | Temp 98.4°F | Ht 64.0 in | Wt 378.0 lb

## 2022-05-09 DIAGNOSIS — E559 Vitamin D deficiency, unspecified: Secondary | ICD-10-CM

## 2022-05-09 DIAGNOSIS — F3289 Other specified depressive episodes: Secondary | ICD-10-CM | POA: Diagnosis not present

## 2022-05-09 DIAGNOSIS — Z7984 Long term (current) use of oral hypoglycemic drugs: Secondary | ICD-10-CM

## 2022-05-09 DIAGNOSIS — E538 Deficiency of other specified B group vitamins: Secondary | ICD-10-CM

## 2022-05-09 DIAGNOSIS — E1169 Type 2 diabetes mellitus with other specified complication: Secondary | ICD-10-CM | POA: Diagnosis not present

## 2022-05-09 DIAGNOSIS — E669 Obesity, unspecified: Secondary | ICD-10-CM

## 2022-05-09 DIAGNOSIS — Z6841 Body Mass Index (BMI) 40.0 and over, adult: Secondary | ICD-10-CM | POA: Insufficient documentation

## 2022-05-09 MED ORDER — METFORMIN HCL 500 MG PO TABS
500.0000 mg | ORAL_TABLET | Freq: Two times a day (BID) | ORAL | 0 refills | Status: DC
  Filled 2022-05-09 – 2022-06-05 (×2): qty 60, 30d supply, fill #0

## 2022-05-09 MED ORDER — CYANOCOBALAMIN 500 MCG PO TABS: ORAL_TABLET | ORAL | Status: DC

## 2022-05-09 MED ORDER — BUPROPION HCL ER (SR) 150 MG PO TB12
150.0000 mg | ORAL_TABLET | Freq: Every day | ORAL | 0 refills | Status: DC
  Filled 2022-05-09 – 2022-06-07 (×3): qty 30, 30d supply, fill #0

## 2022-05-09 MED ORDER — SEMAGLUTIDE(0.25 OR 0.5MG/DOS) 2 MG/3ML ~~LOC~~ SOPN
0.5000 mg | PEN_INJECTOR | SUBCUTANEOUS | 0 refills | Status: DC
  Filled 2022-05-09: qty 3, 28d supply, fill #0
  Filled 2022-06-05: qty 3, fill #0

## 2022-05-09 MED ORDER — VITAMIN D (ERGOCALCIFEROL) 1.25 MG (50000 UNIT) PO CAPS
50000.0000 [IU] | ORAL_CAPSULE | ORAL | 0 refills | Status: DC
  Filled 2022-05-09 – 2022-06-05 (×2): qty 4, 28d supply, fill #0

## 2022-05-10 ENCOUNTER — Other Ambulatory Visit (HOSPITAL_COMMUNITY): Payer: Self-pay

## 2022-05-15 ENCOUNTER — Other Ambulatory Visit (HOSPITAL_COMMUNITY): Payer: Self-pay

## 2022-05-23 NOTE — Progress Notes (Signed)
Chief Complaint:   OBESITY Alexa [name] is here to discuss her progress with her obesity treatment plan along with follow-up of her obesity related diagnoses. Alexa [name] is on the Category 3 Plan and states she is following her eating plan approximately 80-85% of the time. Alexa [name] states she is walking for 15 minutes 3 times per week.  Today's visit was #: 61 Starting weight: 434 lbs Starting date: 06/22/2020 Today's weight: 378 lbs Today's date: 05/09/2022 Total lbs lost to date: 56 Total lbs lost since last in-office visit: 1  Interim History: Alexa [name] has been working on her weight loss.  She is open to looking at other eating plans.  Subjective:   1. Type 2 diabetes mellitus with other specified complication, unspecified whether long term insulin use ([place]) [place] change in insurance recently and will likely not have Mounjaro covered.  2. Vitamin D deficiency Alexa [name] is on vitamin D prescription, and requests a refill today.  3. B12 deficiency Alexa [name] is on B12, and requests a refill today.  She notes her fatigue is improving.  4. Emotional Eating Behavior Alexa [name] is working on decreasing emotional eating behaviors and she feels she is doing better.  No side effects were noted with Wellbutrin.  Assessment/Plan:   1. Type 2 diabetes mellitus with other specified complication, unspecified whether long term insulin use ([place]) Alexa [name] agreed to discontinue Mounjaro, and start Ozempic 0.5 mg once weekly with no refills.  We will refill metformin for 1 month.  - metFORMIN (GLUCOPHAGE) 500 MG tablet; Take 1 tablet (500 mg total) by mouth 2 (two) times daily with a meal.  Dispense: 60 tablet; Refill: 0 - Semaglutide,0.25 or 0.'5MG'$ /DOS, 2 MG/3ML SOPN; Inject 0.5 mg into the skin once a week.  Dispense: 3 mL; Refill: 0  2. Vitamin D deficiency Alexa [name] will continue prescription vitamin D, and we will refill for 1 month.  - Vitamin D, Ergocalciferol, (DRISDOL) 1.25 MG (50000 UNIT) CAPS capsule;  Take 1 capsule (50,000 Units total) by mouth every 7 (seven) days.  Dispense: 4 capsule; Refill: 0  3. B12 deficiency Alexa [name] will continue B12 supplementation, and we will refill for 1 month.  - cyanocobalamin (VITAMIN B12) 500 MCG tablet; [phone] mcg once daily  4. Emotional Eating Behavior Alexa [name] will continue Wellbutrin SR, and we will refill for 1 month.  - buPROPion (WELLBUTRIN SR) 150 MG 12 hr tablet; Take 1 tablet (150 mg total) by mouth daily.  Dispense: 30 tablet; Refill: 0  5. BMI 60.0-69.9, adult ([place])  6. Obesity, Beginning BMI 74.50 Alexa [name] is currently in the action stage of change. As such, her goal is to continue with weight loss efforts. She has agreed to change to following a lower carbohydrate, vegetable and lean protein rich diet plan.   Exercise goals: As is.   Behavioral modification strategies: increasing lean protein intake, decreasing simple carbohydrates, and increasing vegetables.  Alexa [name] has agreed to follow-up with our clinic in 4 weeks. She was informed of the importance of frequent follow-up visits to maximize her success with intensive lifestyle modifications for her multiple health conditions.   Objective:   Blood pressure (!) 152/77, pulse 65, temperature 98.4 F (36.9 C), height '5\' 4"'$  (1.626 m), weight (!) 378 lb (171.5 kg), SpO2 98 %. Body mass index is 64.88 kg/m.  General: Cooperative, alert, well developed, in no acute distress. HEENT: Conjunctivae and lids unremarkable. Cardiovascular: Regular rhythm.  Lungs: Normal work of breathing. Neurologic: No focal deficits.   Lab Results  Component Value Date  CREATININE 0.76 04/11/2022   BUN 9 04/11/2022   NA 139 04/11/2022   K 4.5 04/11/2022   CL 104 04/11/2022   CO2 22 04/11/2022   Lab Results  Component Value Date   ALT 9 04/11/2022   AST 15 04/11/2022   ALKPHOS 55 04/11/2022   BILITOT 0.2 04/11/2022   Lab Results  Component Value Date   HGBA1C 5.6 04/11/2022   HGBA1C 5.8 (H)  10/11/2021   HGBA1C 5.6 02/08/2021   HGBA1C 6.2 (H) 11/08/2020   HGBA1C 6.0 (H) 06/22/2020   Lab Results  Component Value Date   INSULIN 16.0 04/11/2022   INSULIN 17.2 10/11/2021   INSULIN 17.6 02/08/2021   INSULIN 3.9 11/08/2020   INSULIN 20.3 06/22/2020   Lab Results  Component Value Date   TSH 1.490 04/11/2022   Lab Results  Component Value Date   CHOL 168 04/11/2022   HDL 43 04/11/2022   LDLCALC 102 (H) 04/11/2022   LDLDIRECT 100 10/08/2015   TRIG 131 04/11/2022   CHOLHDL 3.8 11/06/2021   Lab Results  Component Value Date   VD25OH 22.9 (L) 04/11/2022   VD25OH 23.5 (L) 10/11/2021   VD25OH 42.6 02/08/2021   Lab Results  Component Value Date   WBC 4.9 04/11/2022   HGB 11.8 04/11/2022   HCT 32.9 (L) 04/11/2022   MCV 93 04/11/2022   PLT 414 04/11/2022   Lab Results  Component Value Date   IRON 38 (L) 10/08/2015   TIBC 416 10/08/2015   FERRITIN 27 10/08/2015   Attestation Statements:   Reviewed by clinician on day of visit: allergies, medications, problem list, medical history, surgical history, family history, social history, and previous encounter notes.  I have personally spent 44 minutes total time today in preparation, patient care, and documentation for this visit, including the following: review of clinical lab tests; review of medical tests/procedures/services.   I, Trixie Dredge, am acting as transcriptionist for Dennard Nip, MD.  I have reviewed the above documentation for accuracy and completeness, and I agree with the above. -  Dennard Nip, MD

## 2022-05-25 ENCOUNTER — Ambulatory Visit: Payer: Commercial Managed Care - HMO

## 2022-05-29 ENCOUNTER — Telehealth: Payer: Self-pay | Admitting: *Deleted

## 2022-05-29 ENCOUNTER — Ambulatory Visit: Payer: Medicaid Other | Attending: Cardiology | Admitting: *Deleted

## 2022-05-29 DIAGNOSIS — Z5181 Encounter for therapeutic drug level monitoring: Secondary | ICD-10-CM

## 2022-05-29 DIAGNOSIS — Z7901 Long term (current) use of anticoagulants: Secondary | ICD-10-CM

## 2022-05-29 LAB — POCT INR: POC INR: 2.5

## 2022-05-29 NOTE — Patient Instructions (Signed)
Description   Continue taking 1.5 tablets daily.  [facility] [phone] Recheck INR 1 week post procedure.

## 2022-05-29 NOTE — Telephone Encounter (Signed)
[place] and the office was closed. Lvm for someone to call office back to obtain information about clearance

## 2022-05-29 NOTE — Telephone Encounter (Signed)
Preoperative team, please contact requesting office and l inquire about details surrounding procedure.  Once we know details surrounding upcoming dental procedure we will be able to provide appropriate recommendations.  Thank you for your help.  Jossie Ng. Rhema Boyett NP-C     05/29/2022, [facility] [address] Office [phone] Fax [phone]

## 2022-05-29 NOTE — Telephone Encounter (Signed)
Pt is scheduled to have 3-4 teeth removed on 3/20, she is under the impression that she is suppose to hold her warfarin 4 days prior to procedure.   [facility] in [place] is to do the procedure.  Telephone number# [phone]

## 2022-05-30 ENCOUNTER — Ambulatory Visit: Payer: Commercial Managed Care - HMO | Admitting: Podiatry

## 2022-05-31 NOTE — Telephone Encounter (Signed)
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    Procedure: 3 extractions, 3 fillings, deep cleaning Date of procedure: TBD   CrCl 151 (with adjusted body weight) Platelet count 414  Patient does not require pre-op antibiotics for dental procedure.  Per office protocol, patient can hold warfarin for 4 days prior to procedure.   Patient will not need bridging with Lovenox (enoxaparin) around procedure.  **This guidance is not considered finalized until pre-operative APP has relayed final recommendations.**

## 2022-05-31 NOTE — Telephone Encounter (Signed)
   Pre-operative Risk Assessment    Patient Name: Alexa [name]  DOB: 06/13/1975 MRN: [id number]     Request for Surgical Clearance    Procedure:  3 extractions and 3 fillings / deep cleanings   Date of Surgery:  Clearance TBD                                 Surgeon:  Dr. Mable Paris  Surgeon's Group or Practice Name:  [facility]  Phone number:  [phone] Fax number:  [phone]   Type of Clearance Requested:   - Medical    Type of Anesthesia:  IV sedation   Additional requests/questions:    Oneal Grout   05/31/2022, 1:41 PM

## 2022-05-31 NOTE — Telephone Encounter (Signed)
   Primary Cardiologist: Quay Burow, MD  Chart reviewed as part of pre-operative protocol coverage. Given past medical history and time since last visit, based on ACC/AHA guidelines, GAO BLAUER would be at acceptable risk for the planned procedure without further cardiovascular testing.   Patient was advised that if she develops new symptoms prior to surgery to contact our office to arrange a follow-up appointment. She verbalized understanding.  Per office protocol, patient can hold warfarin for 4 days prior to procedure.   Patient will not need bridging with Lovenox (enoxaparin) around procedure.  I will route this recommendation to the requesting party via Epic fax function and remove from pre-op pool.  Please call with questions.  Emmaline Life, NP-C  05/31/2022, 3:02 PM [address] Office [phone] Fax [phone]

## 2022-06-05 ENCOUNTER — Other Ambulatory Visit (INDEPENDENT_AMBULATORY_CARE_PROVIDER_SITE_OTHER): Payer: Self-pay | Admitting: Physician Assistant

## 2022-06-05 ENCOUNTER — Other Ambulatory Visit: Payer: Self-pay | Admitting: Family Medicine

## 2022-06-05 DIAGNOSIS — R600 Localized edema: Secondary | ICD-10-CM

## 2022-06-05 DIAGNOSIS — E876 Hypokalemia: Secondary | ICD-10-CM

## 2022-06-05 DIAGNOSIS — G5793 Unspecified mononeuropathy of bilateral lower limbs: Secondary | ICD-10-CM

## 2022-06-06 ENCOUNTER — Other Ambulatory Visit (HOSPITAL_COMMUNITY): Payer: Self-pay

## 2022-06-06 ENCOUNTER — Telehealth: Payer: Self-pay | Admitting: Cardiovascular Disease

## 2022-06-06 ENCOUNTER — Encounter (HOSPITAL_COMMUNITY): Payer: Self-pay

## 2022-06-06 NOTE — Telephone Encounter (Signed)
LVM to call office.  Instructions regarding holding medication are in 3/5 note,  Will discuss when returns call.

## 2022-06-06 NOTE — Telephone Encounter (Signed)
Pt would like a callback in regards to when she'll need to stop coumadin for upcoming dentist appt on 3/20 since she hasn't received a call about it yet. Please advise.

## 2022-06-07 ENCOUNTER — Ambulatory Visit (INDEPENDENT_AMBULATORY_CARE_PROVIDER_SITE_OTHER): Payer: Commercial Managed Care - HMO | Admitting: Family Medicine

## 2022-06-07 ENCOUNTER — Other Ambulatory Visit (HOSPITAL_COMMUNITY): Payer: Self-pay

## 2022-06-07 VITALS — BP 126/74 | HR 89 | Temp 98.2°F | Ht 64.0 in | Wt 380.0 lb

## 2022-06-07 DIAGNOSIS — F3289 Other specified depressive episodes: Secondary | ICD-10-CM

## 2022-06-07 DIAGNOSIS — Z7985 Long-term (current) use of injectable non-insulin antidiabetic drugs: Secondary | ICD-10-CM

## 2022-06-07 DIAGNOSIS — E559 Vitamin D deficiency, unspecified: Secondary | ICD-10-CM

## 2022-06-07 DIAGNOSIS — E1169 Type 2 diabetes mellitus with other specified complication: Secondary | ICD-10-CM | POA: Diagnosis not present

## 2022-06-07 DIAGNOSIS — Z6841 Body Mass Index (BMI) 40.0 and over, adult: Secondary | ICD-10-CM

## 2022-06-07 DIAGNOSIS — E669 Obesity, unspecified: Secondary | ICD-10-CM

## 2022-06-07 DIAGNOSIS — E538 Deficiency of other specified B group vitamins: Secondary | ICD-10-CM | POA: Diagnosis not present

## 2022-06-07 DIAGNOSIS — Z7984 Long term (current) use of oral hypoglycemic drugs: Secondary | ICD-10-CM

## 2022-06-07 MED ORDER — VITAMIN D (ERGOCALCIFEROL) 1.25 MG (50000 UNIT) PO CAPS
50000.0000 [IU] | ORAL_CAPSULE | ORAL | 0 refills | Status: DC
  Filled 2022-06-07 – 2022-07-03 (×2): qty 4, 28d supply, fill #0

## 2022-06-07 MED ORDER — SEMAGLUTIDE (2 MG/DOSE) 8 MG/3ML ~~LOC~~ SOPN
2.0000 mg | PEN_INJECTOR | SUBCUTANEOUS | 0 refills | Status: DC
  Filled 2022-06-07: qty 3, fill #0
  Filled 2022-07-03: qty 3, 28d supply, fill #0

## 2022-06-07 MED ORDER — BUPROPION HCL ER (SR) 150 MG PO TB12
150.0000 mg | ORAL_TABLET | Freq: Every day | ORAL | 0 refills | Status: DC
  Filled 2022-06-07: qty 30, 30d supply, fill #0

## 2022-06-07 MED ORDER — METFORMIN HCL 500 MG PO TABS
500.0000 mg | ORAL_TABLET | Freq: Two times a day (BID) | ORAL | 0 refills | Status: DC
  Filled 2022-06-07 – 2022-06-08 (×2): qty 60, 30d supply, fill #0

## 2022-06-07 MED ORDER — CYANOCOBALAMIN 500 MCG PO TABS: ORAL_TABLET | ORAL | Status: DC

## 2022-06-07 NOTE — Telephone Encounter (Signed)
Called and spoke with pt.  Went over instructions for holding warfarin 4 days prior to dental procedure and restarting night of procedure taking 1 tablet x 2 days the resume normal dose..  Dental procedure is 06/13/22.  Last dose will be 06/08/22. She verbalized understanding.

## 2022-06-08 ENCOUNTER — Other Ambulatory Visit (INDEPENDENT_AMBULATORY_CARE_PROVIDER_SITE_OTHER): Payer: Self-pay | Admitting: Physician Assistant

## 2022-06-08 ENCOUNTER — Other Ambulatory Visit (HOSPITAL_COMMUNITY): Payer: Self-pay

## 2022-06-08 ENCOUNTER — Other Ambulatory Visit (HOSPITAL_BASED_OUTPATIENT_CLINIC_OR_DEPARTMENT_OTHER): Payer: Self-pay

## 2022-06-08 ENCOUNTER — Other Ambulatory Visit: Payer: Self-pay

## 2022-06-08 DIAGNOSIS — E876 Hypokalemia: Secondary | ICD-10-CM

## 2022-06-09 ENCOUNTER — Other Ambulatory Visit (HOSPITAL_COMMUNITY): Payer: Self-pay

## 2022-06-11 ENCOUNTER — Encounter (HOSPITAL_COMMUNITY): Payer: Self-pay

## 2022-06-11 ENCOUNTER — Other Ambulatory Visit (HOSPITAL_COMMUNITY): Payer: Self-pay

## 2022-06-11 NOTE — Telephone Encounter (Signed)
Patient was called and has decide to ley her weight doctor refill most her medication to prevent confusing.

## 2022-06-11 NOTE — Telephone Encounter (Signed)
Copied from [place] [phone]. Topic: General - Other >> Jun 08, 2022  4:15 PM [place] B wrote: Reason for CRM: Patient called in doesn't have [employer] she has [employer], not sure if she pays a premium for them. Please call back if talking about [employer] or [employer].

## 2022-06-13 NOTE — Progress Notes (Signed)
Chief Complaint:   OBESITY Alexa [name] is here to discuss her progress with her obesity treatment plan along with follow-up of her obesity related diagnoses. Alexa [name] is on following a lower carbohydrate, vegetable and lean protein rich diet plan and states she is following her eating plan approximately 80% of the time. Alexa [name] states she is walking 3 times per week.  Today's visit was #: 28 Starting weight: 434 lbs Starting date: 06/22/2020 Today's weight: 380 lbs Today's date: 06/07/2022 Total lbs lost to date: 59 Total lbs lost since last in-office visit: 0  Interim History: Alexa [name] notes life has been more hectic lately.  She is hoping things are settling down now and she will be able to meal plan and prep.  Subjective:   1. Type 2 diabetes mellitus with other specified complication, unspecified whether long term insulin use (HCC) [place]'s recent A1c was well-controlled at 5.6.  2. Vitamin D deficiency Alexa [name] is on vitamin D, and her last vitamin D level was very low.  3. B12 deficiency Alexa [name]'s B12 was low, and she has no history of pernicious anemia or gastric surgery.  4. Emotional Eating Behavior Alexa [name] is doing well with decreasing emotional eating behaviors despite extra challenges.  She went a little off track, but she is working on this now.  Assessment/Plan:   1. Type 2 diabetes mellitus with other specified complication, unspecified whether long term insulin use (HCC) Alexa [name] will continue her medications, and we will refill Ozempic and metformin for 1 month.  We will recheck labs in 1 month.  - Semaglutide, 2 MG/DOSE, 8 MG/3ML SOPN; Inject 2 mg as directed once a week.  Dispense: 3 mL; Refill: 0 - metFORMIN (GLUCOPHAGE) 500 MG tablet; Take 1 tablet (500 mg total) by mouth 2 (two) times daily with a meal.  Dispense: 60 tablet; Refill: 0  2. Vitamin D deficiency Alexa [name] will continue prescription vitamin D, and we will refill for 1 month.  We will recheck labs in 1  month.  - Vitamin D, Ergocalciferol, (DRISDOL) 1.25 MG (50000 UNIT) CAPS capsule; Take 1 capsule (50,000 Units total) by mouth every 7 (seven) days.  Dispense: 4 capsule; Refill: 0  3. B12 deficiency Alexa [name] will continue B12 supplementation, and we will refill for 1 month.  - cyanocobalamin (VITAMIN B12) 500 MCG tablet; [phone] mcg once daily  4. Emotional Eating Behavior Alexa [name] will continue Wellbutrin SR, and we will refill for 1 month.  - buPROPion (WELLBUTRIN SR) 150 MG 12 hr tablet; Take 1 tablet (150 mg total) by mouth daily.  Dispense: 30 tablet; Refill: 0  5. BMI 60.0-69.9, adult ([place])  6. Obesity, Beginning BMI 74.50 Alexa [name] is currently in the action stage of change. As such, her goal is to continue with weight loss efforts. She has agreed to the Category 3 Plan.   Exercise goals: As is.   Behavioral modification strategies: increasing lean protein intake.  Alexa [name] has agreed to follow-up with our clinic in 3 to 4 weeks. She was informed of the importance of frequent follow-up visits to maximize her success with intensive lifestyle modifications for her multiple health conditions.   Objective:   Blood pressure 126/74, pulse 89, temperature 98.2 F (36.8 C), height 5\' 4"  (1.626 m), weight (!) 380 lb (172.4 kg), SpO2 98 %. Body mass index is 65.23 kg/m.  Lab Results  Component Value Date   CREATININE 0.76 04/11/2022   BUN 9 04/11/2022   NA 139 04/11/2022   K 4.5 04/11/2022   CL  104 04/11/2022   CO2 22 04/11/2022   Lab Results  Component Value Date   ALT 9 04/11/2022   AST 15 04/11/2022   ALKPHOS 55 04/11/2022   BILITOT 0.2 04/11/2022   Lab Results  Component Value Date   HGBA1C 5.6 04/11/2022   HGBA1C 5.8 (H) 10/11/2021   HGBA1C 5.6 02/08/2021   HGBA1C 6.2 (H) 11/08/2020   HGBA1C 6.0 (H) 06/22/2020   Lab Results  Component Value Date   INSULIN 16.0 04/11/2022   INSULIN 17.2 10/11/2021   INSULIN 17.6 02/08/2021   INSULIN 3.9 11/08/2020   INSULIN  20.3 06/22/2020   Lab Results  Component Value Date   TSH 1.490 04/11/2022   Lab Results  Component Value Date   CHOL 168 04/11/2022   HDL 43 04/11/2022   LDLCALC 102 (H) 04/11/2022   LDLDIRECT 100 10/08/2015   TRIG 131 04/11/2022   CHOLHDL 3.8 11/06/2021   Lab Results  Component Value Date   VD25OH 22.9 (L) 04/11/2022   VD25OH 23.5 (L) 10/11/2021   VD25OH 42.6 02/08/2021   Lab Results  Component Value Date   WBC 4.9 04/11/2022   HGB 11.8 04/11/2022   HCT 32.9 (L) 04/11/2022   MCV 93 04/11/2022   PLT 414 04/11/2022   Lab Results  Component Value Date   IRON 38 (L) 10/08/2015   TIBC 416 10/08/2015   FERRITIN 27 10/08/2015   Attestation Statements:   Reviewed by clinician on day of visit: allergies, medications, problem list, medical history, surgical history, family history, social history, and previous encounter notes.  I have personally spent 40 minutes total time today in preparation, patient care, and documentation for this visit, including the following: review of clinical lab tests; review of medical tests/procedures/services.  I, Trixie Dredge, am acting as transcriptionist for Dennard Nip, MD.  I have reviewed the above documentation for accuracy and completeness, and I agree with the above. -  Dennard Nip, MD

## 2022-06-14 ENCOUNTER — Other Ambulatory Visit (HOSPITAL_COMMUNITY): Payer: Self-pay

## 2022-06-14 ENCOUNTER — Other Ambulatory Visit (INDEPENDENT_AMBULATORY_CARE_PROVIDER_SITE_OTHER): Payer: Self-pay | Admitting: Family Medicine

## 2022-06-14 DIAGNOSIS — E876 Hypokalemia: Secondary | ICD-10-CM

## 2022-06-14 MED ORDER — POTASSIUM CHLORIDE CRYS ER 10 MEQ PO TBCR
10.0000 meq | EXTENDED_RELEASE_TABLET | Freq: Two times a day (BID) | ORAL | 0 refills | Status: DC
  Filled 2022-06-14 – 2022-07-03 (×2): qty 60, 30d supply, fill #0

## 2022-06-20 ENCOUNTER — Ambulatory Visit: Payer: Medicaid Other | Attending: Internal Medicine

## 2022-06-20 DIAGNOSIS — I824Y9 Acute embolism and thrombosis of unspecified deep veins of unspecified proximal lower extremity: Secondary | ICD-10-CM

## 2022-06-20 DIAGNOSIS — Z7901 Long term (current) use of anticoagulants: Secondary | ICD-10-CM | POA: Diagnosis not present

## 2022-06-20 DIAGNOSIS — I4891 Unspecified atrial fibrillation: Secondary | ICD-10-CM

## 2022-06-20 LAB — POCT INR: INR: 1.8 — AB (ref 2.0–3.0)

## 2022-06-20 NOTE — Patient Instructions (Signed)
Description   Take 2 tablets today, then resume same dosage of Warfarin 1.5 tablets daily.  [facility] [phone] If you have not heard from our office by 06/06/2022 please give [place] a call.  Recheck INR 4 weeks. [facility] [phone]

## 2022-07-03 ENCOUNTER — Other Ambulatory Visit: Payer: Self-pay | Admitting: Family Medicine

## 2022-07-03 ENCOUNTER — Other Ambulatory Visit (INDEPENDENT_AMBULATORY_CARE_PROVIDER_SITE_OTHER): Payer: Self-pay | Admitting: Family Medicine

## 2022-07-03 ENCOUNTER — Other Ambulatory Visit: Payer: Self-pay

## 2022-07-03 ENCOUNTER — Other Ambulatory Visit (HOSPITAL_COMMUNITY): Payer: Self-pay

## 2022-07-03 DIAGNOSIS — G5793 Unspecified mononeuropathy of bilateral lower limbs: Secondary | ICD-10-CM

## 2022-07-03 DIAGNOSIS — R600 Localized edema: Secondary | ICD-10-CM

## 2022-07-03 DIAGNOSIS — E538 Deficiency of other specified B group vitamins: Secondary | ICD-10-CM

## 2022-07-03 MED ORDER — GABAPENTIN 300 MG PO CAPS
300.0000 mg | ORAL_CAPSULE | Freq: Two times a day (BID) | ORAL | 0 refills | Status: DC
Start: 2022-07-03 — End: 2022-07-11
  Filled 2022-07-03: qty 180, 90d supply, fill #0

## 2022-07-03 MED ORDER — FUROSEMIDE 40 MG PO TABS
40.0000 mg | ORAL_TABLET | Freq: Two times a day (BID) | ORAL | 0 refills | Status: DC
Start: 2022-07-03 — End: 2022-10-09
  Filled 2022-07-03 – 2022-07-21 (×2): qty 180, 90d supply, fill #0

## 2022-07-04 ENCOUNTER — Other Ambulatory Visit (HOSPITAL_COMMUNITY): Payer: Self-pay

## 2022-07-10 ENCOUNTER — Encounter (HOSPITAL_COMMUNITY): Payer: Self-pay

## 2022-07-10 ENCOUNTER — Other Ambulatory Visit (HOSPITAL_COMMUNITY): Payer: Self-pay

## 2022-07-10 ENCOUNTER — Encounter (INDEPENDENT_AMBULATORY_CARE_PROVIDER_SITE_OTHER): Payer: Self-pay | Admitting: Family Medicine

## 2022-07-10 ENCOUNTER — Telehealth (INDEPENDENT_AMBULATORY_CARE_PROVIDER_SITE_OTHER): Payer: Self-pay | Admitting: *Deleted

## 2022-07-10 ENCOUNTER — Ambulatory Visit (INDEPENDENT_AMBULATORY_CARE_PROVIDER_SITE_OTHER): Payer: Medicaid Other | Admitting: Family Medicine

## 2022-07-10 VITALS — BP 132/78 | HR 73 | Temp 97.7°F | Ht 64.0 in | Wt 381.0 lb

## 2022-07-10 DIAGNOSIS — E1169 Type 2 diabetes mellitus with other specified complication: Secondary | ICD-10-CM

## 2022-07-10 DIAGNOSIS — G5793 Unspecified mononeuropathy of bilateral lower limbs: Secondary | ICD-10-CM | POA: Diagnosis not present

## 2022-07-10 DIAGNOSIS — E559 Vitamin D deficiency, unspecified: Secondary | ICD-10-CM | POA: Diagnosis not present

## 2022-07-10 DIAGNOSIS — E669 Obesity, unspecified: Secondary | ICD-10-CM

## 2022-07-10 DIAGNOSIS — F3289 Other specified depressive episodes: Secondary | ICD-10-CM | POA: Diagnosis not present

## 2022-07-10 DIAGNOSIS — Z7985 Long-term (current) use of injectable non-insulin antidiabetic drugs: Secondary | ICD-10-CM

## 2022-07-10 DIAGNOSIS — Z7984 Long term (current) use of oral hypoglycemic drugs: Secondary | ICD-10-CM

## 2022-07-10 DIAGNOSIS — Z6841 Body Mass Index (BMI) 40.0 and over, adult: Secondary | ICD-10-CM

## 2022-07-10 MED ORDER — SEMAGLUTIDE (2 MG/DOSE) 8 MG/3ML ~~LOC~~ SOPN
2.0000 mg | PEN_INJECTOR | SUBCUTANEOUS | 0 refills | Status: DC
Start: 2022-07-10 — End: 2022-08-08
  Filled 2022-07-10: qty 3, fill #0
  Filled 2022-07-21: qty 3, 28d supply, fill #0

## 2022-07-10 MED ORDER — BUPROPION HCL ER (SR) 150 MG PO TB12
150.0000 mg | ORAL_TABLET | Freq: Every day | ORAL | 0 refills | Status: DC
Start: 2022-07-10 — End: 2022-08-08
  Filled 2022-07-10 – 2022-07-21 (×2): qty 30, 30d supply, fill #0

## 2022-07-10 MED ORDER — VITAMIN D (ERGOCALCIFEROL) 1.25 MG (50000 UNIT) PO CAPS
50000.0000 [IU] | ORAL_CAPSULE | ORAL | 0 refills | Status: DC
Start: 2022-07-10 — End: 2022-08-08
  Filled 2022-07-10 – 2022-07-24 (×3): qty 4, 28d supply, fill #0

## 2022-07-10 MED ORDER — METFORMIN HCL 500 MG PO TABS
500.0000 mg | ORAL_TABLET | Freq: Two times a day (BID) | ORAL | 0 refills | Status: DC
Start: 2022-07-10 — End: 2022-08-08
  Filled 2022-07-10 – 2022-07-21 (×2): qty 60, 30d supply, fill #0

## 2022-07-10 NOTE — Telephone Encounter (Signed)
PA submitted via Cover[employer]eds for Ozempic.

## 2022-07-11 NOTE — Progress Notes (Unsigned)
Chief Complaint:   OBESITY Alexa [name] is here to discuss her progress with her obesity treatment plan along with follow-up of her obesity related diagnoses. Alexa [name] is on the Category 3 Plan and states she is following her eating plan approximately 90% of the time. Alexa [name] states she is walking for 15-20 minutes 3 times per week.  Today's visit was #: 29 Starting weight: 434 lbs Starting date: 06/22/2020 Today's weight: 381 lbs Today's date: 07/10/2022 Total lbs lost to date: 53 Total lbs lost since last in-office visit: 0  Interim History: Alexa [name] is doing well mostly maintaining her weight.  She is trying to portion control and make smarter choices.  She is working on staying more active.  Subjective:   1. Vitamin D deficiency Alexa [name] is on vitamin D, and her last level was not yet at goal.  2. Type 2 diabetes mellitus with other specified complication, unspecified whether long term insulin use Alexa [name] is working on her diet and doing well on her medications.  No nausea or vomiting was noted.  3. Neuropathy involving both lower extremities Alexa [name] needs another prescription for gabapentin and she is waiting to see her PCP for a refill.  4. Emotional Eating Behavior Alexa [name] is stable on her medications with no insomnia noted, and her blood pressure is stable.  Assessment/Plan:   1. Vitamin D deficiency Alexa [name] will continue prescription vitamin D 50,000 IU once weekly, and we will refill for 1 month.  - Vitamin D, Ergocalciferol, (DRISDOL) 1.25 MG (50000 UNIT) CAPS capsule; Take 1 capsule (50,000 Units total) by mouth every 7 (seven) days.  Dispense: 4 capsule; Refill: 0  2. Type 2 diabetes mellitus with other specified complication, unspecified whether long term insulin use Alexa [name] will continue her medications, and we will refill metformin and Ozempic for 1 month.  - metFORMIN (GLUCOPHAGE) 500 MG tablet; Take 1 tablet (500 mg total) by mouth 2 (two) times daily with a meal.   Dispense: 60 tablet; Refill: 0 - Semaglutide, 2 MG/DOSE, 8 MG/3ML SOPN; Inject 2 mg as directed once a week.  Dispense: 3 mL; Refill: 0  3. Neuropathy involving both lower extremities We will refill gabapentin for 1 month. Alexa [name] is to get future refills from her PCP.  4. Emotional Eating Behavior Alexa [name] will continue Wellbutrin SR 150 mg once daily, and we will refill for 1 month.  - buPROPion (WELLBUTRIN SR) 150 MG 12 hr tablet; Take 1 tablet (150 mg total) by mouth daily.  Dispense: 30 tablet; Refill: 0  5. BMI 60.0-69.9, adult  6. Obesity, Beginning BMI 74.50 Alexa [name] is currently in the action stage of change. As such, her goal is to continue with weight loss efforts. She has agreed to the Category 3 Plan.   Exercise goals: As is.   Behavioral modification strategies: increasing lean protein intake.  Alexa [name] has agreed to follow-up with our clinic in 4 weeks. She was informed of the importance of frequent follow-up visits to maximize her success with intensive lifestyle modifications for her multiple health conditions.   Objective:   Blood pressure 132/78, pulse 73, temperature 97.7 F (36.5 C), height  (1.626 m), weight (!) 381 lb (172.8 kg), SpO2 97 %. Body mass index is 65.4 kg/m.  Lab Results  Component Value Date   CREATININE 0.76 04/11/2022   BUN 9 04/11/2022   NA 139 04/11/2022   K 4.5 04/11/2022   CL 104 04/11/2022   CO2 22 04/11/2022   Lab Results  Component Value Date  ALT 9 04/11/2022   AST 15 04/11/2022   ALKPHOS 55 04/11/2022   BILITOT 0.2 04/11/2022   Lab Results  Component Value Date   HGBA1C 5.6 04/11/2022   HGBA1C 5.8 (H) 10/11/2021   HGBA1C 5.6 02/08/2021   HGBA1C 6.2 (H) 11/08/2020   HGBA1C 6.0 (H) 06/22/2020   Lab Results  Component Value Date   INSULIN 16.0 04/11/2022   INSULIN 17.2 10/11/2021   INSULIN 17.6 02/08/2021   INSULIN 3.9 11/08/2020   INSULIN 20.3 06/22/2020   Lab Results  Component Value Date   TSH 1.490  04/11/2022   Lab Results  Component Value Date   CHOL 168 04/11/2022   HDL 43 04/11/2022   LDLCALC 102 (H) 04/11/2022   LDLDIRECT 100 10/08/2015   TRIG 131 04/11/2022   CHOLHDL 3.8 11/06/2021   Lab Results  Component Value Date   VD25OH 22.9 (L) 04/11/2022   VD25OH 23.5 (L) 10/11/2021   VD25OH 42.6 02/08/2021   Lab Results  Component Value Date   WBC 4.9 04/11/2022   HGB 11.8 04/11/2022   HCT 32.9 (L) 04/11/2022   MCV 93 04/11/2022   PLT 414 04/11/2022   Lab Results  Component Value Date   IRON 38 (L) 10/08/2015   TIBC 416 10/08/2015   FERRITIN 27 10/08/2015   Attestation Statements:   Reviewed by clinician on day of visit: allergies, medications, problem list, medical history, surgical history, family history, social history, and previous encounter notes.   I, Burt Knack, am acting as transcriptionist for Quillian Quince, MD.  I have reviewed the above documentation for accuracy and completeness, and I agree with the above. -  Quillian Quince, MD

## 2022-07-12 ENCOUNTER — Other Ambulatory Visit: Payer: Self-pay

## 2022-07-12 ENCOUNTER — Other Ambulatory Visit (HOSPITAL_COMMUNITY): Payer: Self-pay

## 2022-07-12 MED ORDER — GABAPENTIN 300 MG PO CAPS
300.0000 mg | ORAL_CAPSULE | Freq: Two times a day (BID) | ORAL | 0 refills | Status: DC
Start: 2022-07-12 — End: 2022-11-20
  Filled 2022-07-12: qty 60, 30d supply, fill #0

## 2022-07-16 ENCOUNTER — Other Ambulatory Visit (HOSPITAL_COMMUNITY): Payer: Self-pay

## 2022-07-17 ENCOUNTER — Ambulatory Visit (INDEPENDENT_AMBULATORY_CARE_PROVIDER_SITE_OTHER): Payer: Medicaid Other | Admitting: Podiatry

## 2022-07-17 ENCOUNTER — Encounter: Payer: Self-pay | Admitting: Podiatry

## 2022-07-17 VITALS — BP 162/84

## 2022-07-17 DIAGNOSIS — E1142 Type 2 diabetes mellitus with diabetic polyneuropathy: Secondary | ICD-10-CM | POA: Diagnosis not present

## 2022-07-17 DIAGNOSIS — M79674 Pain in right toe(s): Secondary | ICD-10-CM | POA: Diagnosis not present

## 2022-07-17 DIAGNOSIS — B351 Tinea unguium: Secondary | ICD-10-CM

## 2022-07-17 DIAGNOSIS — M79675 Pain in left toe(s): Secondary | ICD-10-CM | POA: Diagnosis not present

## 2022-07-18 ENCOUNTER — Ambulatory Visit: Payer: Medicaid Other | Attending: Internal Medicine | Admitting: *Deleted

## 2022-07-18 ENCOUNTER — Other Ambulatory Visit (HOSPITAL_COMMUNITY): Payer: Self-pay

## 2022-07-18 DIAGNOSIS — Z7901 Long term (current) use of anticoagulants: Secondary | ICD-10-CM

## 2022-07-18 DIAGNOSIS — I824Y9 Acute embolism and thrombosis of unspecified deep veins of unspecified proximal lower extremity: Secondary | ICD-10-CM

## 2022-07-18 DIAGNOSIS — I4891 Unspecified atrial fibrillation: Secondary | ICD-10-CM | POA: Diagnosis not present

## 2022-07-18 LAB — POCT INR: INR: 2.3 (ref 2.0–3.0)

## 2022-07-18 NOTE — Patient Instructions (Signed)
Description   Continue taking Warfarin 1.5 tablets daily. [facility] [phone] If you have not heard from our office by 06/06/2022 please give [place] a call.  Recheck INR 5 weeks. [facility] [phone]

## 2022-07-19 ENCOUNTER — Other Ambulatory Visit (HOSPITAL_COMMUNITY): Payer: Self-pay

## 2022-07-19 ENCOUNTER — Other Ambulatory Visit: Payer: Self-pay

## 2022-07-20 NOTE — Progress Notes (Signed)
  Subjective:  Patient ID: Alexa [name], female    DOB: 1975-06-30,  MRN: [id number]  Alexa [name] presents to clinic today for {jgcomplaint:23593}  Chief Complaint  Patient presents with   Nail Problem    Rm 17 RFC bilateral nail trim 1-5.    New problem(s): None. {jgcomplaint:23593}  PCP is Georganna Skeans, MD.  Allergies  Allergen Reactions   Penicillins Hives    Has patient had a PCN reaction causing immediate rash, facial/tongue/throat swelling, SOB or lightheadedness with hypotension: No Has patient had a PCN reaction causing severe rash involving mucus membranes or skin necrosis: No Has patient had a PCN reaction that required hospitalization No Has patient had a PCN reaction occurring within the last 10 years: No If all of the above answers are "NO", then may proceed with Cephalosporin use.    Review of Systems: Negative except as noted in the HPI.  Objective:  Vitals:   07/17/22 0952 07/17/22 1008  BP: (!) 167/86 (!) 162/84   Alexa [name] is a pleasant 47 y.o. female {jgbodyhabitus:24098} AAO x 3.   Vascular Examination: CFT <3 seconds b/l LE. Faintly palpable DP pulses b/l LE. Diminished PT pulse(s) b/l LE. Pedal hair absent. No pain with calf compression b/l. Lower extremity skin temperature gradient within normal limits. Nonpitting edema noted BLE. Evidence of chronic venous insufficiency b/l LE. No ischemia or gangrene noted b/l LE. No cyanosis or clubbing noted b/l LE.  Dermatological Examination: No open wounds b/l LE. No interdigital macerations noted b/l LE. Toenails 1-5 b/l elongated, discolored, dystrophic, thickened, crumbly with subungual debris and tenderness to dorsal palpation.   Evidence of chronic venous insufficiency b/l lower extremities. Hyperpigmentation consistent with findings of chronic venous insufficiency is present b/l lower extremities.  Neurological Examination: Pt has subjective symptoms of neuropathy. Protective sensation  intact 5/5 intact bilaterally with 10g monofilament b/l. Vibratory sensation decreased b/l.  Musculoskeletal Examination: Muscle strength 5/5 to all lower extremity muscle groups bilaterally. Pes planus deformity noted bilateral LE. Utilizes cane for ambulation assistance.  Assessment/Plan: 1. Pain due to onychomycosis of toenails of both feet   2. Diabetic peripheral neuropathy associated with type 2 diabetes mellitus (HCC)     {Jgplan:23602::"-Patient/POA to call should there be question/concern in the interim."}   Return in about 3 months (around 10/16/2022).  Freddie Breech, DPM

## 2022-07-21 ENCOUNTER — Other Ambulatory Visit (HOSPITAL_COMMUNITY): Payer: Self-pay

## 2022-07-21 ENCOUNTER — Other Ambulatory Visit (INDEPENDENT_AMBULATORY_CARE_PROVIDER_SITE_OTHER): Payer: Self-pay | Admitting: Family Medicine

## 2022-07-21 ENCOUNTER — Other Ambulatory Visit: Payer: Self-pay | Admitting: Internal Medicine

## 2022-07-21 DIAGNOSIS — E876 Hypokalemia: Secondary | ICD-10-CM

## 2022-07-21 DIAGNOSIS — Z7901 Long term (current) use of anticoagulants: Secondary | ICD-10-CM

## 2022-07-21 DIAGNOSIS — E538 Deficiency of other specified B group vitamins: Secondary | ICD-10-CM

## 2022-07-21 DIAGNOSIS — I2699 Other pulmonary embolism without acute cor pulmonale: Secondary | ICD-10-CM

## 2022-07-22 MED ORDER — WARFARIN SODIUM 7.5 MG PO TABS
ORAL_TABLET | ORAL | 0 refills | Status: DC
Start: 2022-07-22 — End: 2022-10-09
  Filled 2022-07-22: qty 200, 90d supply, fill #0

## 2022-07-23 ENCOUNTER — Other Ambulatory Visit (HOSPITAL_COMMUNITY): Payer: Self-pay

## 2022-07-23 ENCOUNTER — Other Ambulatory Visit: Payer: Self-pay

## 2022-07-23 MED ORDER — POTASSIUM CHLORIDE CRYS ER 10 MEQ PO TBCR
10.0000 meq | EXTENDED_RELEASE_TABLET | Freq: Two times a day (BID) | ORAL | 0 refills | Status: DC
Start: 2022-07-23 — End: 2022-08-08
  Filled 2022-07-23: qty 60, 30d supply, fill #0

## 2022-07-24 ENCOUNTER — Other Ambulatory Visit (HOSPITAL_COMMUNITY): Payer: Self-pay

## 2022-07-24 ENCOUNTER — Other Ambulatory Visit: Payer: Self-pay

## 2022-07-26 NOTE — Telephone Encounter (Signed)
Your PA has been faxed to the plan as a paper copy. Please contact the plan directly if you haven't received a determination in a typical timeframe.  You will be notified of the determination via fax.   Contact plan to follow up on [facility]

## 2022-08-08 ENCOUNTER — Ambulatory Visit (INDEPENDENT_AMBULATORY_CARE_PROVIDER_SITE_OTHER): Payer: Medicaid Other | Admitting: Family Medicine

## 2022-08-08 ENCOUNTER — Other Ambulatory Visit: Payer: Self-pay

## 2022-08-08 ENCOUNTER — Encounter (INDEPENDENT_AMBULATORY_CARE_PROVIDER_SITE_OTHER): Payer: Self-pay | Admitting: Family Medicine

## 2022-08-08 ENCOUNTER — Ambulatory Visit: Payer: Self-pay | Admitting: *Deleted

## 2022-08-08 VITALS — BP 128/80 | HR 58 | Temp 98.1°F | Ht 64.0 in | Wt 380.0 lb

## 2022-08-08 DIAGNOSIS — E876 Hypokalemia: Secondary | ICD-10-CM

## 2022-08-08 DIAGNOSIS — R4689 Other symptoms and signs involving appearance and behavior: Secondary | ICD-10-CM | POA: Diagnosis not present

## 2022-08-08 DIAGNOSIS — F3289 Other specified depressive episodes: Secondary | ICD-10-CM

## 2022-08-08 DIAGNOSIS — Z6841 Body Mass Index (BMI) 40.0 and over, adult: Secondary | ICD-10-CM

## 2022-08-08 DIAGNOSIS — E1169 Type 2 diabetes mellitus with other specified complication: Secondary | ICD-10-CM

## 2022-08-08 DIAGNOSIS — E559 Vitamin D deficiency, unspecified: Secondary | ICD-10-CM

## 2022-08-08 DIAGNOSIS — Z7984 Long term (current) use of oral hypoglycemic drugs: Secondary | ICD-10-CM

## 2022-08-08 DIAGNOSIS — E669 Obesity, unspecified: Secondary | ICD-10-CM

## 2022-08-08 MED ORDER — VITAMIN D (ERGOCALCIFEROL) 1.25 MG (50000 UNIT) PO CAPS
50000.0000 [IU] | ORAL_CAPSULE | ORAL | 0 refills | Status: DC
Start: 2022-08-08 — End: 2022-09-06
  Filled 2022-08-08 – 2022-08-22 (×2): qty 4, 28d supply, fill #0

## 2022-08-08 MED ORDER — METFORMIN HCL 500 MG PO TABS
500.0000 mg | ORAL_TABLET | Freq: Two times a day (BID) | ORAL | 0 refills | Status: DC
Start: 2022-08-08 — End: 2022-09-06
  Filled 2022-08-08 – 2022-08-22 (×2): qty 60, 30d supply, fill #0

## 2022-08-08 MED ORDER — POTASSIUM CHLORIDE CRYS ER 10 MEQ PO TBCR
10.0000 meq | EXTENDED_RELEASE_TABLET | Freq: Two times a day (BID) | ORAL | 0 refills | Status: DC
Start: 2022-08-08 — End: 2022-09-06
  Filled 2022-08-08: qty 60, 30d supply, fill #0

## 2022-08-08 MED ORDER — BUPROPION HCL ER (SR) 150 MG PO TB12
150.0000 mg | ORAL_TABLET | Freq: Every day | ORAL | 0 refills | Status: DC
Start: 2022-08-08 — End: 2022-09-06
  Filled 2022-08-08 – 2022-08-22 (×2): qty 30, 30d supply, fill #0

## 2022-08-08 MED ORDER — RYBELSUS 3 MG PO TABS
3.0000 mg | ORAL_TABLET | Freq: Every day | ORAL | 0 refills | Status: DC
Start: 2022-08-08 — End: 2022-09-19
  Filled 2022-08-08 – 2022-09-18 (×3): qty 30, 30d supply, fill #0

## 2022-08-08 NOTE — Telephone Encounter (Signed)
  Chief Complaint: Earache Symptoms: Left earache 4-5/10. Has "Bad" tooth on that side that needs to come out. Frequency: 2 weeks Pertinent Negatives: Patient denies fever, swelling, drainage Disposition: [] ED /[] Urgent Care (no appt availability in office) / [x] Appointment(In office/virtual)/ []  [facility]/ [] Home Care/ [] Refused Recommended Disposition /[] [facility]/ []  Follow-up with PCP Additional Notes: Appt secured for tomorrow. Care advise provided, pt verbalizes understanding.  Reason for Disposition  Earache  (Exceptions: brief ear pain of < 60 minutes duration, earache occurring during air travel  Answer Assessment - Initial Assessment Questions 1. LOCATION: "Which ear is involved?"     Left 2. ONSET: "When did the ear start hurting"      2 weeks ago 3. SEVERITY: "How bad is the pain?"  (Scale 1-10; mild, moderate or severe)   - MILD (1-3): doesn't interfere with normal activities    - MODERATE (4-7): interferes with normal activities or awakens from sleep    - SEVERE (8-10): excruciating pain, unable to do any normal activities      4-5/10 4. URI SYMPTOMS: "Do you have a runny nose or cough?"     no 5. FEVER: "Do you have a fever?" If Yes, ask: "What is your temperature, how was it measured, and when did it start?"     no 6. CAUSE: "Have you been swimming recently?", "How often do you use Q-TIPS?", "Have you had any recent air travel or scuba diving?"      7. OTHER SYMPTOMS: "Do you have any other symptoms?" (e.g., headache, stiff neck, dizziness, vomiting, runny nose, decreased hearing)     Sinus meds helped. Tooth on that side that needs to come out.  Protocols used: Davina Poke

## 2022-08-09 ENCOUNTER — Encounter: Payer: Self-pay | Admitting: Family Medicine

## 2022-08-09 ENCOUNTER — Ambulatory Visit: Payer: Medicaid Other | Admitting: Family Medicine

## 2022-08-09 VITALS — BP 127/76 | HR 85 | Temp 98.1°F | Resp 16 | Wt 386.0 lb

## 2022-08-09 DIAGNOSIS — H9202 Otalgia, left ear: Secondary | ICD-10-CM | POA: Diagnosis not present

## 2022-08-09 DIAGNOSIS — G8929 Other chronic pain: Secondary | ICD-10-CM | POA: Diagnosis not present

## 2022-08-09 DIAGNOSIS — K089 Disorder of teeth and supporting structures, unspecified: Secondary | ICD-10-CM

## 2022-08-09 NOTE — Progress Notes (Signed)
Patient c/o left ear pain x 7 days. Patient said that she has a tooth that need to come out and not sure if pain is coming from that.

## 2022-08-13 NOTE — Progress Notes (Unsigned)
Chief Complaint:   OBESITY Alexa [name] is here to discuss her progress with her obesity treatment plan along with follow-up of her obesity related diagnoses. Alexa [name] is on the Category 3 Plan and states she is following her eating plan approximately 90% of the time. Alexa [name] states she is walking for 15-20 minutes 4-7 times per week.  Today's visit was #: 30 Starting weight: 434 lbs Starting date: 06/22/2020 Today's weight: 380 lbs Today's date: 08/08/2022 Total lbs lost to date: 54 Total lbs lost since last in-office visit: 1  Interim History: Alexa [name] continues to work on her weight loss, but she is struggling with hunger and she is very busy which interferes with meal planning and prepping.  Subjective:   1. Type 2 diabetes mellitus with other specified complication, unspecified whether long term insulin use (HCC) Alexa [name] was unable to get Ozempic from her insurance.  She is open to looking at other options.  She is not checking her blood sugars at home.  2. Vitamin D deficiency Alexa [name] is on vitamin D, and she is due for labs soon.  3. Decreased potassium in the blood Alexa [name] is doing well on KCI supplement, and she is due for labs soon.  4. Emotional Eating Behavior Alexa [name] is working on decreasing emotional eating behavior.  She has no problems with Wellbutrin and her blood pressure is stable.  Assessment/Plan:   1. Type 2 diabetes mellitus with other specified complication, unspecified whether long term insulin use (HCC) Alexa [name] agreed to start Rybelsus at 3 mg every morning with no refills, and we will refill metformin for 1 month.  She is to check her blood sugars daily, and we will recheck labs next month.  - metFORMIN (GLUCOPHAGE) 500 MG tablet; Take 1 tablet (500 mg total) by mouth 2 (two) times daily with a meal.  Dispense: 60 tablet; Refill: 0 - Semaglutide (RYBELSUS) 3 MG TABS; Take 1 tablet (3 mg total) by mouth daily with breakfast.  Dispense: 30 tablet; Refill: 0  2.  Vitamin D deficiency We will refill prescription vitamin D for 1 month, and we will recheck labs next month.  - Vitamin D, Ergocalciferol, (DRISDOL) 1.25 MG (50000 UNIT) CAPS capsule; Take 1 capsule (50,000 Units total) by mouth every 7 (seven) days.  Dispense: 4 capsule; Refill: 0  3. Decreased potassium in the blood We will refill KCI for 1 month, and we will recheck labs next month.  - potassium chloride (KLOR-CON M) 10 MEQ tablet; Take 1 tablet (10 mEq total) by mouth 2 (two) times daily.  Dispense: 60 tablet; Refill: 0  4. Emotional Eating Behavior We will refill Wellbutrin SR for 1 month, and emotional eating behavior strategies were discussed.  - buPROPion (WELLBUTRIN SR) 150 MG 12 hr tablet; Take 1 tablet (150 mg total) by mouth daily.  Dispense: 30 tablet; Refill: 0  5. Obesity, Beginning BMI 74.50  6. Obesity with Current BMI of 65.3 Alexa [name] is currently in the action stage of change. As such, her goal is to continue with weight loss efforts. She has agreed to the Category 3 Plan.   Exercise goals: All adults should avoid inactivity. Some physical activity is better than none, and adults who participate in any amount of physical activity gain some health benefits.  Behavioral modification strategies: increasing lean protein intake and no skipping meals.  Alexa [name] has agreed to follow-up with our clinic in 4 weeks. She was informed of the importance of frequent follow-up visits to maximize her success with intensive  lifestyle modifications for her multiple health conditions.   Objective:   Blood pressure 128/80, pulse (!) 58, temperature 98.1 F (36.7 C), height 5\' 4"  (1.626 m), weight (!) 380 lb (172.4 kg), SpO2 100 %. Body mass index is 65.23 kg/m.  Lab Results  Component Value Date   CREATININE 0.76 04/11/2022   BUN 9 04/11/2022   NA 139 04/11/2022   K 4.5 04/11/2022   CL 104 04/11/2022   CO2 22 04/11/2022   Lab Results  Component Value Date   ALT 9 04/11/2022    AST 15 04/11/2022   ALKPHOS 55 04/11/2022   BILITOT 0.2 04/11/2022   Lab Results  Component Value Date   HGBA1C 5.6 04/11/2022   HGBA1C 5.8 (H) 10/11/2021   HGBA1C 5.6 02/08/2021   HGBA1C 6.2 (H) 11/08/2020   HGBA1C 6.0 (H) 06/22/2020   Lab Results  Component Value Date   INSULIN 16.0 04/11/2022   INSULIN 17.2 10/11/2021   INSULIN 17.6 02/08/2021   INSULIN 3.9 11/08/2020   INSULIN 20.3 06/22/2020   Lab Results  Component Value Date   TSH 1.490 04/11/2022   Lab Results  Component Value Date   CHOL 168 04/11/2022   HDL 43 04/11/2022   LDLCALC 102 (H) 04/11/2022   LDLDIRECT 100 10/08/2015   TRIG 131 04/11/2022   CHOLHDL 3.8 11/06/2021   Lab Results  Component Value Date   VD25OH 22.9 (L) 04/11/2022   VD25OH 23.5 (L) 10/11/2021   VD25OH 42.6 02/08/2021   Lab Results  Component Value Date   WBC 4.9 04/11/2022   HGB 11.8 04/11/2022   HCT 32.9 (L) 04/11/2022   MCV 93 04/11/2022   PLT 414 04/11/2022   Lab Results  Component Value Date   IRON 38 (L) 10/08/2015   TIBC 416 10/08/2015   FERRITIN 27 10/08/2015   Attestation Statements:   Reviewed by clinician on day of visit: allergies, medications, problem list, medical history, surgical history, family history, social history, and previous encounter notes.  I have personally spent 42 minutes total time today in preparation, patient care, and documentation for this visit, including the following: review of clinical lab tests; review of medical tests/procedures/services.   I, Burt Knack, am acting as transcriptionist for Quillian Quince, MD.  I have reviewed the above documentation for accuracy and completeness, and I agree with the above. -  Quillian Quince, MD

## 2022-08-14 ENCOUNTER — Encounter: Payer: Self-pay | Admitting: Family Medicine

## 2022-08-14 NOTE — Progress Notes (Signed)
Established Patient Office Visit  Subjective    Patient ID: Alexa [name], female    DOB: 26-Nov-1975  Age: 47 y.o. MRN: [id number]  CC:  Chief Complaint  Patient presents with   Ear Pain    HPI Alexa [name] presents for complaint of tooth pain as well as left ear pain. Patient denies fever/chills or viral sx. Patient reports that she will be having a visit with her dentist for pain on the left side of her mouth.    Outpatient Encounter Medications as of 08/09/2022  Medication Sig   acetaminophen (TYLENOL) 500 MG tablet Take 1,000 mg by mouth daily as needed for mild pain.   blood glucose meter kit and supplies KIT Use up to four times daily as directed.   buPROPion (WELLBUTRIN SR) 150 MG 12 hr tablet Take 1 tablet (150 mg total) by mouth daily.   cetirizine (ZYRTEC) 10 MG tablet Take 1 tablet (10 mg total) by mouth daily.   cyanocobalamin (VITAMIN B12) 500 MCG tablet [phone] mcg once daily   diazepam (VALIUM) 10 MG tablet Take 1 by mouth 1/2-1 hour before procedure. May repeat in 2-4 hours if necessary.   furosemide (LASIX) 40 MG tablet Take 1 tablet (40 mg total) by mouth 2 (two) times daily.   gabapentin (NEURONTIN) 300 MG capsule Take 1 capsule (300 mg total) by mouth 2 (two) times daily.   glucose blood test strip ascensia  Meter/strips preferred Test 3x daily Use as instructed Dx.dm   Lancets (ONETOUCH ULTRASOFT) lancets Test daily Use as instructed dm2   metFORMIN (GLUCOPHAGE) 500 MG tablet Take 1 tablet (500 mg total) by mouth 2 (two) times daily with a meal.   Misc. Devices MISC Bariatric size shower chair with handle 423 lbs, BMI 72  Dx. Pulmonary HTN, DM2 with neuropathy, morbid obesity   potassium chloride (KLOR-CON M) 10 MEQ tablet Take 1 tablet (10 mEq total) by mouth 2 (two) times daily.   Semaglutide (RYBELSUS) 3 MG TABS Take 1 tablet (3 mg total) by mouth daily with breakfast.   Vitamin D, Ergocalciferol, (DRISDOL) 1.25 MG (50000 UNIT) CAPS capsule Take 1  capsule (50,000 Units total) by mouth every 7 (seven) days.   warfarin (COUMADIN) 7.5 MG tablet Take 1 to 1.5 tablets by mouth daily or as directed by Coumadin clinic   No facility-administered encounter medications on file as of 08/09/2022.    Past Medical History:  Diagnosis Date   Anemia    Back pain    Bilateral swelling of feet    CHF (congestive heart failure) (HCC)    Diabetes mellitus without complication (HCC)    Phreesia 03/02/2020   DM2 (diabetes mellitus, type 2) (HCC)    Hypertension    Joint pain    Nocturnal hypoxia    Obstructive sleep apnea syndrome, moderate    Pulmonary embolism (HCC) 05/14/2016   submassive, on chronic anticoagulation   Pulmonary hypertension (HCC)    Vitamin D deficiency     Past Surgical History:  Procedure Laterality Date   CESAREAN SECTION      Family History  Problem Relation Age of Onset   Diabetes Maternal Grandmother    Breast cancer Maternal Grandmother        breast cancer    Brain cancer Maternal Grandmother    Hypertension Mother    Obesity Mother    Heart disease Father    Obesity Father    Breast cancer Maternal Aunt 21   Breast cancer Maternal  Aunt 9    Social History   Socioeconomic History   Marital status: Divorced    Spouse name: n/a   Number of children: 2   Years of education: Not on file   Highest education level: Not on file  Occupational History   Occupation: [employer]  Tobacco Use   Smoking status: Former    Types: Cigarettes   Smokeless tobacco: Never   Tobacco comments:    only smokes when she drinks alcohol - 1 pack per week  Vaping Use   Vaping Use: Never used  Substance and Sexual Activity   Alcohol use: Yes    Alcohol/week: 4.0 - 6.0 standard drinks of alcohol    Types: 4 - 6 Standard drinks or equivalent per week    Comment: mixed drinks 3-4 times/weeks   Drug use: No   Sexual activity: Not Currently  Other Topics Concern   Not on file  Social History Narrative   Her  children live with her.   Ex-husband lives locally.   Social Determinants of Health   Financial Resource Strain: Not on file  Food Insecurity: Not on file  Transportation Needs: Not on file  Physical Activity: Not on file  Stress: Not on file  Social Connections: Not on file  Intimate Partner Violence: Not on file    Review of Systems  Constitutional:  Negative for chills and fever.  HENT:  Positive for ear pain. Negative for ear discharge.   All other systems reviewed and are negative.       Objective    BP 127/76   Pulse 85   Temp 98.1 F (36.7 C) (Oral)   Resp 16   Wt (!) 386 lb (175.1 kg)   SpO2 97%   BMI 66.26 kg/m   Physical Exam Vitals and nursing note reviewed.  Constitutional:      General: She is not in acute distress. HENT:     Right Ear: Tympanic membrane, ear canal and external ear normal.     Left Ear: Tympanic membrane, ear canal and external ear normal.     Mouth/Throat:     Dentition: Dental tenderness present.  Cardiovascular:     Rate and Rhythm: Normal rate and regular rhythm.  Pulmonary:     Effort: Pulmonary effort is normal.     Breath sounds: Normal breath sounds.  Abdominal:     Palpations: Abdomen is soft.  Neurological:     General: No focal deficit present.     Mental Status: She is alert and oriented to person, place, and time.         Assessment & Plan:   1. Chronic dental pain Patient to keep scheduled appt with dental professional  2. Left ear pain Most likely referred 2/2 above. Tylenol/nsaids prn    Return if symptoms worsen or fail to improve.   Tommie [place], MD

## 2022-08-17 ENCOUNTER — Telehealth: Payer: Self-pay | Admitting: *Deleted

## 2022-08-17 NOTE — Telephone Encounter (Signed)
Dental office is closed until Tuesday 08/21/22.

## 2022-08-17 NOTE — Telephone Encounter (Signed)
   Pre-operative Risk Assessment    Patient Name: Alexa [name]  DOB: July 02, 1975 MRN: [id number]     Request for Surgical Clearance    Procedure:   WILL NEED TO CONFIRM PROCEDURE TO BE DONE; MULTIPLE BOXES CHECKED OFF ON CLEARANCE FORM. WE CANNOT PROVIDE A BLANKET TYPE CLEARANCE;   Date of Surgery:  Clearance TBD                                 Surgeon:  NOT LISTED Surgeon's Group or Practice Name:  [facility] Phone number:  [phone] Fax number:  [phone]   Type of Clearance Requested:   - Medical  - Pharmacy:  Hold Warfarin (Coumadin)     Type of Anesthesia:  Local    Additional requests/questions:    Elpidio Anis   08/17/2022, 12:13 PM

## 2022-08-21 NOTE — Telephone Encounter (Signed)
I s/w the [facility] office and clarified procedure to be done:   PROCEDURE: 3 TEETH EXTRACTED; SURGICAL EXTRACTION  DR. Luciana Axe

## 2022-08-21 NOTE — Telephone Encounter (Signed)
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    Procedure:  3 TEETH EXTRACTED; SURGICAL EXTRACTION   Date of procedure: TBD   CHA2DS2-VASc Score = 4   This indicates a 4.8% annual risk of stroke. The patient's score is based upon: CHF History: 0 HTN History: 1 Diabetes History: 0 Stroke History: 2 (hx of DVT and PE) Vascular Disease History: 0 Age Score: 0 Gender Score: 1     CrCl >100 mL/min Platelet count 414 K  Given Afib with hx of DVT and PE  recommend to  hold warfarin for 5 days prior to procedure.   Patient WILL need bridging with Lovenox (enoxaparin) around procedure.   Patient warfarin followed by the [facility].  We will reach out to patient to set up bridging information and schedule follow up INR.     **This guidance is not considered finalized until pre-operative APP has relayed final recommendations.**

## 2022-08-21 NOTE — Telephone Encounter (Signed)
Left message to call back to set up tele pre op appt.  

## 2022-08-21 NOTE — Telephone Encounter (Signed)
Pharmacy please advise on holding Coumadin prior to surgical extraction of 3 teeth scheduled for TBD. Thank you.

## 2022-08-21 NOTE — Telephone Encounter (Signed)
   Name: Alexa [name]  DOB: 12/22/1975  MRN: [id number]  Primary Cardiologist: Nanetta Batty, MD   Preoperative team, please contact this patient and set up a phone call appointment for further preoperative risk assessment. Please obtain consent and complete medication review. Thank you for your help.  I confirm that guidance regarding antiplatelet and oral anticoagulation therapy has been completed and, if necessary, noted below.  Given Afib with hx of DVT and PE  recommend to  hold warfarin for 5 days prior to procedure.   Patient WILL need bridging with Lovenox (enoxaparin) around procedure.   Patient warfarin followed by [facility].  We will reach out to patient to set up bridging information and schedule follow up INR.   Napoleon Form, Leodis Rains, NP 08/21/2022, 1:23 PM [facility]

## 2022-08-22 ENCOUNTER — Other Ambulatory Visit (HOSPITAL_COMMUNITY): Payer: Self-pay

## 2022-08-22 ENCOUNTER — Ambulatory Visit: Payer: Medicaid Other | Attending: Internal Medicine

## 2022-08-22 ENCOUNTER — Other Ambulatory Visit: Payer: Self-pay

## 2022-08-22 ENCOUNTER — Other Ambulatory Visit (INDEPENDENT_AMBULATORY_CARE_PROVIDER_SITE_OTHER): Payer: Self-pay | Admitting: Family Medicine

## 2022-08-22 DIAGNOSIS — Z7901 Long term (current) use of anticoagulants: Secondary | ICD-10-CM

## 2022-08-22 DIAGNOSIS — Z5181 Encounter for therapeutic drug level monitoring: Secondary | ICD-10-CM | POA: Diagnosis not present

## 2022-08-22 DIAGNOSIS — G5793 Unspecified mononeuropathy of bilateral lower limbs: Secondary | ICD-10-CM

## 2022-08-22 LAB — POCT INR: INR: 2 (ref 2.0–3.0)

## 2022-08-22 NOTE — Telephone Encounter (Signed)
LAST APPOINTMENT DATE: 08/08/2022 NEXT APPOINTMENT DATE: 09/06/2022   [place] [facility] [address] [place] [address] Phone: [phone] Fax: [phone]  [id number] [place], [place] - [address] [place] [address] Phone: [phone] Fax: [phone]  Patient is requesting a refill of the following medications: Requested Prescriptions   Pending Prescriptions Disp Refills   gabapentin (NEURONTIN) 300 MG capsule 60 capsule 0    Sig: Take 1 capsule (300 mg total) by mouth 2 (two) times daily.    Date last filled: 07/12/2022 Previously prescribed by DR Dalbert Garnet  Lab Results  Component Value Date   HGBA1C 5.6 04/11/2022   HGBA1C 5.8 (H) 10/11/2021   HGBA1C 5.6 02/08/2021   Lab Results  Component Value Date   MICROALBUR 1.4 01/28/2016   LDLCALC 102 (H) 04/11/2022   CREATININE 0.76 04/11/2022   Lab Results  Component Value Date   VD25OH 22.9 (L) 04/11/2022   VD25OH 23.5 (L) 10/11/2021   VD25OH 42.6 02/08/2021    BP Readings from Last 3 Encounters:  08/09/22 127/76  08/08/22 128/80  07/17/22 (!) 162/84

## 2022-08-22 NOTE — Telephone Encounter (Signed)
I just s/w the pt and she tells me that she saw the coumadin clinic today and they d/w her about needing Lovenox while off Warfarin for dental procedure. I did also states she will need a tele appt as well. Pt states she has appt 7 am with DDS tomorrow. I asked her if she will please call our pre op line [phone] and let us know when they have a date for the procedure. Once we have the date of procedure we can then set up tele appt and Lovenox bridge.

## 2022-08-22 NOTE — Telephone Encounter (Signed)
Alexa [name], please let me know what the patient said when you asked her if her PCP will refill this?

## 2022-08-22 NOTE — Telephone Encounter (Signed)
Alexa [name],  I filled this for her as a 1 time refill until her PCP could refill it for her. Can you ask her to please get the next refill from her PCP?

## 2022-08-22 NOTE — Patient Instructions (Addendum)
Description   Take 2 tablets today and then continue taking Warfarin 1.5 tablets daily.  Coumadin Clinic [phone] Recheck INR 5 weeks - Call Coumadin Clinic when you have a procedure date to schedule Coumadin Clinic appt to have INR checked and receive Lovenox Bridge instructions.  [facility] Clinic [phone]

## 2022-08-23 NOTE — Telephone Encounter (Signed)
Call to patient, left message:  were you able to get your refill from your PCP or not, either way please let us know.  Asked for patient to either my chart message [place], or call us back.

## 2022-08-23 NOTE — Telephone Encounter (Signed)
Spoke with patient who states that she spoke with the dental office they were not able to schedule a surgery date yet. Patient informed me that the dental office wants her to send in some x-rays before proceeding forward and she is also having issue with her insurance. Patient will call our office back when she is ready to schedule a tele visit to be cleared for surgery.

## 2022-08-23 NOTE — Telephone Encounter (Signed)
Okey Regal, can you please check on this for me?

## 2022-08-24 ENCOUNTER — Other Ambulatory Visit: Payer: Self-pay

## 2022-08-24 ENCOUNTER — Other Ambulatory Visit (HOSPITAL_COMMUNITY): Payer: Self-pay

## 2022-08-28 ENCOUNTER — Other Ambulatory Visit (HOSPITAL_COMMUNITY): Payer: Self-pay

## 2022-09-06 ENCOUNTER — Other Ambulatory Visit: Payer: Self-pay

## 2022-09-06 ENCOUNTER — Encounter: Payer: Self-pay | Admitting: Pharmacist

## 2022-09-06 ENCOUNTER — Telehealth (INDEPENDENT_AMBULATORY_CARE_PROVIDER_SITE_OTHER): Payer: Self-pay | Admitting: *Deleted

## 2022-09-06 ENCOUNTER — Ambulatory Visit (INDEPENDENT_AMBULATORY_CARE_PROVIDER_SITE_OTHER): Payer: Medicaid Other | Admitting: Family Medicine

## 2022-09-06 ENCOUNTER — Encounter (INDEPENDENT_AMBULATORY_CARE_PROVIDER_SITE_OTHER): Payer: Self-pay | Admitting: Family Medicine

## 2022-09-06 ENCOUNTER — Other Ambulatory Visit (HOSPITAL_COMMUNITY): Payer: Self-pay

## 2022-09-06 VITALS — BP 119/75 | HR 71 | Temp 98.1°F | Ht 64.0 in | Wt 389.0 lb

## 2022-09-06 DIAGNOSIS — E538 Deficiency of other specified B group vitamins: Secondary | ICD-10-CM

## 2022-09-06 DIAGNOSIS — E1169 Type 2 diabetes mellitus with other specified complication: Secondary | ICD-10-CM

## 2022-09-06 DIAGNOSIS — E559 Vitamin D deficiency, unspecified: Secondary | ICD-10-CM

## 2022-09-06 DIAGNOSIS — F3289 Other specified depressive episodes: Secondary | ICD-10-CM

## 2022-09-06 DIAGNOSIS — E669 Obesity, unspecified: Secondary | ICD-10-CM

## 2022-09-06 DIAGNOSIS — R0602 Shortness of breath: Secondary | ICD-10-CM | POA: Diagnosis not present

## 2022-09-06 DIAGNOSIS — Z7984 Long term (current) use of oral hypoglycemic drugs: Secondary | ICD-10-CM

## 2022-09-06 DIAGNOSIS — E876 Hypokalemia: Secondary | ICD-10-CM

## 2022-09-06 DIAGNOSIS — Z6841 Body Mass Index (BMI) 40.0 and over, adult: Secondary | ICD-10-CM

## 2022-09-06 MED ORDER — METFORMIN HCL 500 MG PO TABS
500.0000 mg | ORAL_TABLET | Freq: Two times a day (BID) | ORAL | 0 refills | Status: DC
Start: 2022-09-06 — End: 2022-10-22
  Filled 2022-09-06 – 2022-09-14 (×2): qty 60, 30d supply, fill #0

## 2022-09-06 MED ORDER — BUPROPION HCL ER (SR) 150 MG PO TB12
150.0000 mg | ORAL_TABLET | Freq: Every day | ORAL | 0 refills | Status: DC
Start: 2022-09-06 — End: 2022-10-22
  Filled 2022-09-06 – 2022-09-14 (×2): qty 30, 30d supply, fill #0

## 2022-09-06 MED ORDER — VITAMIN D (ERGOCALCIFEROL) 1.25 MG (50000 UNIT) PO CAPS
50000.0000 [IU] | ORAL_CAPSULE | ORAL | 0 refills | Status: DC
Start: 2022-09-06 — End: 2022-10-22
  Filled 2022-09-06 – 2022-09-18 (×2): qty 4, 28d supply, fill #0

## 2022-09-06 MED ORDER — POTASSIUM CHLORIDE CRYS ER 10 MEQ PO TBCR
10.0000 meq | EXTENDED_RELEASE_TABLET | Freq: Two times a day (BID) | ORAL | 0 refills | Status: DC
Start: 2022-09-06 — End: 2022-10-23
  Filled 2022-09-06 – 2022-09-14 (×2): qty 60, 30d supply, fill #0

## 2022-09-06 NOTE — Telephone Encounter (Signed)
PA for Rybelsus sent via CoverMyMeds, awaiting response.

## 2022-09-07 LAB — CMP14+EGFR
Albumin/Globulin Ratio: 1.4
Alkaline Phosphatase: 46 IU/L (ref 44–121)
BUN/Creatinine Ratio: 15 (ref 9–23)
BUN: 11 mg/dL (ref 6–24)
CO2: 24 mmol/L (ref 20–29)
Creatinine, Ser: 0.75 mg/dL (ref 0.57–1.00)
Glucose: 92 mg/dL (ref 70–99)

## 2022-09-07 LAB — INSULIN, RANDOM: INSULIN: 20.2 u[IU]/mL (ref 2.6–24.9)

## 2022-09-07 LAB — LIPID PANEL WITH LDL/HDL RATIO
HDL: 41 mg/dL (ref >39)
Triglycerides: 95 mg/dL (ref 0–149)

## 2022-09-07 LAB — HEMOGLOBIN A1C: Est. average glucose Bld gHb Est-mCnc: 117 mg/dL

## 2022-09-07 LAB — BRAIN NATRIURETIC PEPTIDE

## 2022-09-08 LAB — CMP14+EGFR
ALT: 16 IU/L (ref 0–32)
AST: 19 IU/L (ref 0–40)
Albumin: 3.9 g/dL (ref 3.9–4.9)
Bilirubin Total: 0.4 mg/dL (ref 0.0–1.2)
Calcium: 8.4 mg/dL — ABNORMAL LOW (ref 8.7–10.2)
Chloride: 103 mmol/L (ref 96–106)
Globulin, Total: 2.8 g/dL (ref 1.5–4.5)
Potassium: 4.1 mmol/L (ref 3.5–5.2)
Sodium: 139 mmol/L (ref 134–144)
Total Protein: 6.7 g/dL (ref 6.0–8.5)
eGFR: 101 mL/min/{1.73_m2} (ref >59)

## 2022-09-08 LAB — HEMOGLOBIN A1C: Hgb A1c MFr Bld: 5.7 % — ABNORMAL HIGH (ref 4.8–5.6)

## 2022-09-08 LAB — LIPID PANEL WITH LDL/HDL RATIO
Cholesterol, Total: 139 mg/dL (ref 100–199)
LDL Chol Calc (NIH): 80 mg/dL (ref 0–99)
LDL/HDL Ratio: 2 ratio (ref 0.0–3.2)
VLDL Cholesterol Cal: 18 mg/dL (ref 5–40)

## 2022-09-08 LAB — VITAMIN B12: Vitamin B-12: 113 pg/mL — ABNORMAL LOW (ref 232–1245)

## 2022-09-08 LAB — VITAMIN D 25 HYDROXY (VIT D DEFICIENCY, FRACTURES): Vit D, 25-Hydroxy: 33.8 ng/mL (ref 30.0–100.0)

## 2022-09-11 ENCOUNTER — Other Ambulatory Visit: Payer: Self-pay

## 2022-09-14 ENCOUNTER — Other Ambulatory Visit (HOSPITAL_COMMUNITY): Payer: Self-pay

## 2022-09-14 ENCOUNTER — Other Ambulatory Visit: Payer: Self-pay | Admitting: Family Medicine

## 2022-09-14 ENCOUNTER — Other Ambulatory Visit (INDEPENDENT_AMBULATORY_CARE_PROVIDER_SITE_OTHER): Payer: Self-pay | Admitting: Family Medicine

## 2022-09-14 DIAGNOSIS — E538 Deficiency of other specified B group vitamins: Secondary | ICD-10-CM

## 2022-09-14 DIAGNOSIS — G5793 Unspecified mononeuropathy of bilateral lower limbs: Secondary | ICD-10-CM

## 2022-09-14 DIAGNOSIS — R600 Localized edema: Secondary | ICD-10-CM

## 2022-09-17 ENCOUNTER — Encounter (INDEPENDENT_AMBULATORY_CARE_PROVIDER_SITE_OTHER): Payer: Self-pay

## 2022-09-17 ENCOUNTER — Other Ambulatory Visit: Payer: Self-pay

## 2022-09-17 ENCOUNTER — Other Ambulatory Visit (HOSPITAL_COMMUNITY): Payer: Self-pay

## 2022-09-18 ENCOUNTER — Other Ambulatory Visit (INDEPENDENT_AMBULATORY_CARE_PROVIDER_SITE_OTHER): Payer: Self-pay

## 2022-09-18 ENCOUNTER — Other Ambulatory Visit (INDEPENDENT_AMBULATORY_CARE_PROVIDER_SITE_OTHER): Payer: Self-pay | Admitting: Family Medicine

## 2022-09-18 ENCOUNTER — Other Ambulatory Visit: Payer: Self-pay

## 2022-09-18 ENCOUNTER — Other Ambulatory Visit (HOSPITAL_COMMUNITY): Payer: Self-pay

## 2022-09-18 DIAGNOSIS — E1169 Type 2 diabetes mellitus with other specified complication: Secondary | ICD-10-CM

## 2022-09-18 DIAGNOSIS — G5793 Unspecified mononeuropathy of bilateral lower limbs: Secondary | ICD-10-CM

## 2022-09-18 NOTE — Telephone Encounter (Signed)
Prior Auth sent to plan via cover my meds.  Patient has tried and failed metformin, Trulicity, Ozempic.  metformin, 500 mg 2 times daily, 05/12/2015 until current, ozempic, 2 mg once weekly, 08/08/2020 until 07/10/2022, trulicity 0.75 once weekly 05/01/2018-08/08/2020  These are plan alternatives:   Metformin (Glucophage) tried and failed, but currently taking.   Glipizide (Glucotrol)  Glimepiride (Amaryl)  Glyburide (Glynase / Diabeta)  Pioglitazone (Actos)  Byetta (exenatide IR)  Bydureon (exenatide ER)  Ozempic (semaglutide SQ) tried and failed  Trulicity (dulaglutide) tried and failed  Victoza (liraglutide)  Januvia (sitagliptin)  Tradjenta (linagliptin)  Invokana (canagliflozin)  Jardiance (empagliflozin)  Farxiga (dapagliflozin)

## 2022-09-19 ENCOUNTER — Other Ambulatory Visit: Payer: Self-pay

## 2022-09-19 NOTE — Telephone Encounter (Signed)
Rybelsus has been approved.  09/18/2022 until 09/18/2023.  Medication, per the insurance,  must be written for a 90 day supply and all strengths are approved.

## 2022-09-20 ENCOUNTER — Other Ambulatory Visit (HOSPITAL_COMMUNITY): Payer: Self-pay

## 2022-09-20 ENCOUNTER — Other Ambulatory Visit: Payer: Self-pay

## 2022-09-20 ENCOUNTER — Other Ambulatory Visit: Payer: Self-pay | Admitting: Family Medicine

## 2022-09-20 ENCOUNTER — Other Ambulatory Visit (INDEPENDENT_AMBULATORY_CARE_PROVIDER_SITE_OTHER): Payer: Self-pay | Admitting: Family Medicine

## 2022-09-20 DIAGNOSIS — E876 Hypokalemia: Secondary | ICD-10-CM | POA: Insufficient documentation

## 2022-09-20 DIAGNOSIS — G5793 Unspecified mononeuropathy of bilateral lower limbs: Secondary | ICD-10-CM

## 2022-09-20 DIAGNOSIS — R600 Localized edema: Secondary | ICD-10-CM

## 2022-09-20 MED ORDER — RYBELSUS 3 MG PO TABS
3.0000 mg | ORAL_TABLET | Freq: Every day | ORAL | 0 refills | Status: DC
Start: 2022-09-20 — End: 2022-10-22
  Filled 2022-09-20: qty 30, 30d supply, fill #0
  Filled 2022-10-09: qty 30, 30d supply, fill #1

## 2022-09-20 NOTE — Telephone Encounter (Signed)
RECEIVED FAX STATING PATIENT HAS BEEN APPROVED FOR RYBELSUS 3 MG. FROM 09/18/2022-09/18/2023. PATIENT SENT MYCHART MESSAGE

## 2022-09-24 ENCOUNTER — Telehealth (INDEPENDENT_AMBULATORY_CARE_PROVIDER_SITE_OTHER): Payer: Self-pay | Admitting: *Deleted

## 2022-09-24 NOTE — Telephone Encounter (Signed)
Tried to complete PA for echo via website [email or web address] but it would not allow me to proceed. Printed off papercopy and faxed to [employer] at [phone].

## 2022-09-24 NOTE — Telephone Encounter (Signed)
Medication was approved, patient sent message via Mychart.

## 2022-09-25 ENCOUNTER — Other Ambulatory Visit (HOSPITAL_COMMUNITY): Payer: Self-pay

## 2022-09-26 ENCOUNTER — Ambulatory Visit: Payer: Medicaid Other | Attending: Cardiovascular Disease | Admitting: *Deleted

## 2022-09-26 DIAGNOSIS — Z7901 Long term (current) use of anticoagulants: Secondary | ICD-10-CM

## 2022-09-26 DIAGNOSIS — Z5181 Encounter for therapeutic drug level monitoring: Secondary | ICD-10-CM | POA: Diagnosis not present

## 2022-09-26 LAB — POCT INR: POC INR: 3.7

## 2022-09-26 NOTE — Patient Instructions (Signed)
Description   Hold warfarin today and then continue taking Warfarin 1.5 tablets daily.  C[facility]in Clinic [phone] Recheck INR 3 weeks - Call Coumadin Clinic when you have a procedure date to schedule Coumadin Clinic appt to have INR checked and receive Lovenox Bridge instructions.  [facility] Clinic [phone]

## 2022-09-26 NOTE — Progress Notes (Signed)
Chief Complaint:   OBESITY Alexa [name] is here to discuss her progress with her obesity treatment plan along with follow-up of her obesity related diagnoses. Alexa [name] is on the Category 3 Plan and states she is following her eating plan approximately 90% of the time. Alexa [name] states she is walking and dancing for 15-20 minutes 3-4 times per week.  Today's visit was #: 31 Starting weight: 434 lbs Starting date: 06/22/2020 Today's weight: 389 lbs Today's date: 09/06/2022 Total lbs lost to date: 45 Total lbs lost since last in-office visit: 0  Interim History: Patient has gained 9 pounds in the last month, despite following her plan reasonably well.  She is unsure if this is water weight.  Subjective:   1. SOBOE (shortness of breath on exertion) Patient has a history of left ventricular hypertrophy and grade 1 diastolic dysfunction she notes increased shortness of breath with exercise and possibly some mild orthopnea.    2. Type 2 diabetes mellitus with other specified complication, unspecified whether long term insulin use (HCC) Patient was prescribed Rybelsus but the pharmacy did not give it to her.  3. Vitamin D deficiency Patient is on vitamin D, and she is due for labs.  4. B12 deficiency Patient has increased B12 rich foods, and she is due for labs.  5. Decreased potassium in the blood  6. Emotional Eating Behavior Patient notes some increase in emotional eating behavior.  No side effects were noted on low-dose Wellbutrin.  Assessment/Plan:   1. SOBOE (shortness of breath on exertion) We will schedule an echocardiogram. Patient will continue Lasix.   - ECHOCARDIOGRAM COMPLETE; Future  2. Type 2 diabetes mellitus with other specified complication, unspecified whether long term insulin use (HCC) We will check labs today, and we will refill metformin for 1 month. We will follow-up on Rybelsus prior authorization today.   - metFORMIN (GLUCOPHAGE) 500 MG tablet; Take 1 tablet  (500 mg total) by mouth 2 (two) times daily with a meal.  Dispense: 60 tablet; Refill: 0 - CMP14+EGFR - Lipid Panel With LDL/HDL Ratio - Insulin, random - Hemoglobin A1c  3. Vitamin D deficiency We will check labs today, and we will refill prescription Vitamin D once weekly for 1 month.   - Vitamin D, Ergocalciferol, (DRISDOL) 1.25 MG (50000 UNIT) CAPS capsule; Take 1 capsule (50,000 Units total) by mouth every 7 (seven) days.  Dispense: 4 capsule; Refill: 0 - VITAMIN D 25 Hydroxy (Vit-D Deficiency, Fractures)  4. B12 deficiency We will check labs today, and we will follow-up at patient's next visit.   - Vitamin B12  5. Decreased potassium in the blood We will refill KCI 10 meq BID for 1 month.   - potassium chloride (KLOR-CON M) 10 MEQ tablet; Take 1 tablet (10 mEq total) by mouth 2 (two) times daily.  Dispense: 60 tablet; Refill: 0  6. Emotional Eating Behavior We will refill Wellbutrin SR 150 mg daily for 1 month.   - buPROPion (WELLBUTRIN SR) 150 MG 12 hr tablet; Take 1 tablet (150 mg total) by mouth daily.  Dispense: 30 tablet; Refill: 0  7. BMI 60.0-69.9, adult (HCC)  8. Obesity, Beginning BMI 74.50 Alexa [name] is currently in the action stage of change. As such, her goal is to continue with weight loss efforts. She has agreed to the Category 3 Plan.   Patient was informed that much of her weight gain appears to be due to fluid retention per our bioimpedance scale.  Exercise goals: As is.  Behavioral modification strategies: increasing water intake and decreasing sodium intake.  Alexa [name] has agreed to follow-up with our clinic in 4 weeks. She was informed of the importance of frequent follow-up visits to maximize her success with intensive lifestyle modifications for her multiple health conditions.   Alexa [name] was informed we would discuss her lab results at her next visit unless there is a critical issue that needs to be addressed sooner. Alexa [name] agreed to keep her next visit at  the agreed upon time to discuss these results.  Objective:   Blood pressure 119/75, pulse 71, temperature 98.1 F (36.7 C), height 5\' 4"  (1.626 m), weight (!) 389 lb (176.4 kg), SpO2 97 %. Body mass index is 66.77 kg/m.  Lab Results  Component Value Date   CREATININE 0.75 09/06/2022   BUN 11 09/06/2022   NA 139 09/06/2022   K 4.1 09/06/2022   CL 103 09/06/2022   CO2 24 09/06/2022   Lab Results  Component Value Date   ALT 16 09/06/2022   AST 19 09/06/2022   ALKPHOS 46 09/06/2022   BILITOT 0.4 09/06/2022   Lab Results  Component Value Date   HGBA1C 5.7 (H) 09/06/2022   HGBA1C 5.6 04/11/2022   HGBA1C 5.8 (H) 10/11/2021   HGBA1C 5.6 02/08/2021   HGBA1C 6.2 (H) 11/08/2020   Lab Results  Component Value Date   INSULIN 20.2 09/06/2022   INSULIN 16.0 04/11/2022   INSULIN 17.2 10/11/2021   INSULIN 17.6 02/08/2021   INSULIN 3.9 11/08/2020   Lab Results  Component Value Date   TSH 1.490 04/11/2022   Lab Results  Component Value Date   CHOL 139 09/06/2022   HDL 41 09/06/2022   LDLCALC 80 09/06/2022   LDLDIRECT 100 10/08/2015   TRIG 95 09/06/2022   CHOLHDL 3.8 11/06/2021   Lab Results  Component Value Date   VD25OH 33.8 09/06/2022   VD25OH 22.9 (L) 04/11/2022   VD25OH 23.5 (L) 10/11/2021   Lab Results  Component Value Date   WBC 4.9 04/11/2022   HGB 11.8 04/11/2022   HCT 32.9 (L) 04/11/2022   MCV 93 04/11/2022   PLT 414 04/11/2022   Lab Results  Component Value Date   IRON 38 (L) 10/08/2015   TIBC 416 10/08/2015   FERRITIN 27 10/08/2015   Attestation Statements:   Reviewed by clinician on day of visit: allergies, medications, problem list, medical history, surgical history, family history, social history, and previous encounter notes.  I have personally spent 42 minutes total time today in preparation, patient care, and documentation for this visit, including the following: review of clinical lab tests; review of medical  tests/procedures/services.   I, Burt Knack, am acting as transcriptionist for Quillian Quince, MD.  I have reviewed the above documentation for accuracy and completeness, and I agree with the above. -  Quillian Quince, MD

## 2022-10-02 ENCOUNTER — Other Ambulatory Visit: Payer: Self-pay | Admitting: Family Medicine

## 2022-10-02 ENCOUNTER — Ambulatory Visit: Payer: Medicaid Other | Admitting: Family Medicine

## 2022-10-02 DIAGNOSIS — G5793 Unspecified mononeuropathy of bilateral lower limbs: Secondary | ICD-10-CM

## 2022-10-02 NOTE — Telephone Encounter (Signed)
Medication Refill - Medication:  gabapentin (NEURONTIN) 300 MG capsule  BID  Has the patient contacted their pharmacy? Yes.   Was told to call Dr Andrey Campanile  Preferred Pharmacy (with phone number or street name): [place] [facility]  Has the patient been seen for an appointment in the last year OR does the patient have an upcoming appointment? Yes.    Agent: Please be advised that RX refills may take up to 3 business days. We ask that you follow-up with your pharmacy.

## 2022-10-02 NOTE — Telephone Encounter (Signed)
Requested medication (s) are due for refill today - unsure  Requested medication (s) are on the active medication list -yes  Future visit scheduled -yes  Last refill: 07/12/22 #60  Notes to clinic: patient advised to contact office regarding outside Rx- patient does have appointment scheduled in 3 weeks  Requested Prescriptions  Pending Prescriptions Disp Refills   gabapentin (NEURONTIN) 300 MG capsule 60 capsule 0    Sig: Take 1 capsule (300 mg total) by mouth 2 (two) times daily.     Neurology: Anticonvulsants - gabapentin Passed - 10/02/2022 10:25 AM      Passed - Cr in normal range and within 360 days    Creatinine  Date Value Ref Range Status  05/24/2017 0.84 0.60 - 1.10 mg/dL Final   Creat  Date Value Ref Range Status  01/28/2016 0.90 0.50 - 1.10 mg/dL Final   Creatinine, Ser  Date Value Ref Range Status  09/06/2022 0.75 0.57 - 1.00 mg/dL Final   Creatinine, Urine  Date Value Ref Range Status  05/14/2016 144.43 mg/dL Final         Passed - Completed PHQ-2 or PHQ-9 in the last 360 days      Passed - Valid encounter within last 12 months    Recent Outpatient Visits           1 month ago Chronic dental pain   [place] [facility], MD   6 months ago Pre-op evaluation   [place] [facility], MD   11 months ago Annual physical exam   [place] [facility], MD   1 year ago Essential hypertension   [place] [facility], MD   1 year ago Generalized anxiety disorder   [place] [facility], MD       Future Appointments             In 3 weeks Georganna Skeans, MD [facility]               Requested Prescriptions  Pending Prescriptions Disp Refills   gabapentin (NEURONTIN) 300 MG capsule 60 capsule 0    Sig: Take 1 capsule (300 mg total) by mouth 2  (two) times daily.     Neurology: Anticonvulsants - gabapentin Passed - 10/02/2022 10:25 AM      Passed - Cr in normal range and within 360 days    Creatinine  Date Value Ref Range Status  05/24/2017 0.84 0.60 - 1.10 mg/dL Final   Creat  Date Value Ref Range Status  01/28/2016 0.90 0.50 - 1.10 mg/dL Final   Creatinine, Ser  Date Value Ref Range Status  09/06/2022 0.75 0.57 - 1.00 mg/dL Final   Creatinine, Urine  Date Value Ref Range Status  05/14/2016 144.43 mg/dL Final         Passed - Completed PHQ-2 or PHQ-9 in the last 360 days      Passed - Valid encounter within last 12 months    Recent Outpatient Visits           1 month ago Chronic dental pain   [place] [facility], MD   6 months ago Pre-op evaluation   [place] [facility], MD   11 months ago Annual physical exam   [facility], MD   1 year ago Essential hypertension   [place] [facility], MD   1 year ago Generalized anxiety disorder   [place] [facility], MD       Future Appointments             In 3 weeks Georganna Skeans, MD [facility]

## 2022-10-04 ENCOUNTER — Ambulatory Visit (INDEPENDENT_AMBULATORY_CARE_PROVIDER_SITE_OTHER): Payer: Medicaid Other | Admitting: Family Medicine

## 2022-10-09 ENCOUNTER — Other Ambulatory Visit (INDEPENDENT_AMBULATORY_CARE_PROVIDER_SITE_OTHER): Payer: Self-pay | Admitting: Family Medicine

## 2022-10-09 ENCOUNTER — Other Ambulatory Visit (HOSPITAL_COMMUNITY): Payer: Self-pay

## 2022-10-09 ENCOUNTER — Other Ambulatory Visit: Payer: Self-pay | Admitting: Family Medicine

## 2022-10-09 ENCOUNTER — Other Ambulatory Visit: Payer: Self-pay

## 2022-10-09 ENCOUNTER — Other Ambulatory Visit: Payer: Self-pay | Admitting: Internal Medicine

## 2022-10-09 DIAGNOSIS — E538 Deficiency of other specified B group vitamins: Secondary | ICD-10-CM

## 2022-10-09 DIAGNOSIS — F3289 Other specified depressive episodes: Secondary | ICD-10-CM

## 2022-10-09 DIAGNOSIS — R600 Localized edema: Secondary | ICD-10-CM

## 2022-10-09 DIAGNOSIS — Z7901 Long term (current) use of anticoagulants: Secondary | ICD-10-CM

## 2022-10-09 DIAGNOSIS — E876 Hypokalemia: Secondary | ICD-10-CM

## 2022-10-09 DIAGNOSIS — E1169 Type 2 diabetes mellitus with other specified complication: Secondary | ICD-10-CM

## 2022-10-09 DIAGNOSIS — G5793 Unspecified mononeuropathy of bilateral lower limbs: Secondary | ICD-10-CM

## 2022-10-09 DIAGNOSIS — E559 Vitamin D deficiency, unspecified: Secondary | ICD-10-CM

## 2022-10-09 DIAGNOSIS — I2699 Other pulmonary embolism without acute cor pulmonale: Secondary | ICD-10-CM

## 2022-10-09 MED ORDER — WARFARIN SODIUM 7.5 MG PO TABS
ORAL_TABLET | ORAL | 0 refills | Status: DC
Start: 2022-10-09 — End: 2023-01-12
  Filled 2022-10-09: qty 200, 90d supply, fill #0

## 2022-10-09 MED ORDER — FUROSEMIDE 40 MG PO TABS
40.0000 mg | ORAL_TABLET | Freq: Two times a day (BID) | ORAL | 0 refills | Status: DC
Start: 2022-10-09 — End: 2022-10-23
  Filled 2022-10-09: qty 180, 90d supply, fill #0

## 2022-10-10 ENCOUNTER — Telehealth (HOSPITAL_BASED_OUTPATIENT_CLINIC_OR_DEPARTMENT_OTHER): Payer: Self-pay | Admitting: *Deleted

## 2022-10-10 ENCOUNTER — Other Ambulatory Visit: Payer: Self-pay

## 2022-10-10 ENCOUNTER — Other Ambulatory Visit (HOSPITAL_COMMUNITY): Payer: Self-pay

## 2022-10-10 NOTE — Telephone Encounter (Signed)
On 09/07/22 an in basket message was sent to Dr. Francena Hanly office for an insurance prior authorization for the Echocardiogram that was ordered.  A follow up call was made on 09/13/22 to check on the status of the authorization--I was informed the no one in their office does insurance prior authorizations.  On 09/24/22 an in basket message was sent to Dr. Dalbert Garnet informing her of the situation.  I called the office again on 10/10/22 requesting an authorization and was informed it was being addressed now.  I requested the order note be updated  with the information.

## 2022-10-15 ENCOUNTER — Telehealth: Payer: Self-pay | Admitting: Emergency Medicine

## 2022-10-15 NOTE — Telephone Encounter (Signed)
Returned a call to the pt and there was no answer, therefore left her a message to call back regarding her upcoming procedure  Pt was due to have a telephone preop follow up before dental extractions & partial clearance for dental procedure is on 08/17/22 encounter notes.

## 2022-10-15 NOTE — Telephone Encounter (Signed)
Patient called to get directions for dental procedures.  She states  she received appointment on Thursday or Friday of last week.   Patient states has not stopped warfarin   She ha an appointment on 7/24 to coumadin clinic.  Patient is aware will route this message to  anticoag clinic. Patient also was given the clinic phone number.

## 2022-10-15 NOTE — Telephone Encounter (Signed)
Called patient .  Phone called dropped

## 2022-10-15 NOTE — Telephone Encounter (Signed)
Patient states that she received call in regards to medication management. Requesting return call.

## 2022-10-15 NOTE — Telephone Encounter (Signed)
Patient is calling back to state that appt for dental is set for 07/25. She would like a call back check on medication management for this. Please advise.

## 2022-10-15 NOTE — Telephone Encounter (Signed)
I called the pt back and left message to call back to discuss further. I then called the dental office we have request for Dr. Luciana Axe. During my conversation with DDS office it was made to my attention that the pt is having her dental procedure with another office and that she had requested her dental x-rays from Dr. Tania Ade office.   If pt has chosen to go to another dental office, we will need a clearance request from that dental office. As well as see previous notes, where the pt is going to need a tele pre op appt as well as she is going to need to hold her coumadin x 5 days and will need Lovenox bridging if still having the same procedure, however this cannot be determined until we receive clearance request for the other dental office.

## 2022-10-16 ENCOUNTER — Ambulatory Visit: Payer: Medicaid Other | Admitting: Podiatry

## 2022-10-16 NOTE — Telephone Encounter (Signed)
Spoke with pt and she states she has no idea what she should be doing but has not stopped her warfarin since she has not been told to do so. Advised per the last preop note she should have called preop to update them about an appointment for her dental extractions since she needs a preop appt. Also, once that is done we will need to be notified for detailed lovenox instructions. She states no one has ever told her any of this so referred back to the calls on 5/29 and 5/30.  Also, she states she has a new dentist and we will need a new clearance as well from the new dentist. Gave the pt the main number to ask for preop as she needs to update them per previous notes on all of the above.

## 2022-10-16 NOTE — Telephone Encounter (Signed)
See all previous notes. I called the pt again today and left message to call to discuss further.

## 2022-10-16 NOTE — Telephone Encounter (Signed)
Pt called back to discuss pre op, though I was unavailable. I called the pt back and left her a vm again.

## 2022-10-17 ENCOUNTER — Ambulatory Visit: Payer: Medicaid Other | Attending: Internal Medicine

## 2022-10-17 DIAGNOSIS — Z7901 Long term (current) use of anticoagulants: Secondary | ICD-10-CM | POA: Diagnosis not present

## 2022-10-17 LAB — POCT INR: INR: 4.2 — AB (ref 2.0–3.0)

## 2022-10-17 NOTE — Telephone Encounter (Signed)
I spoke with pt. She has our fax number and will be speaking with dentist office to get the new clearance faxed to [place].

## 2022-10-17 NOTE — Patient Instructions (Signed)
Description   Hold warfarin today and then START taking Warfarin 1.5 tablets daily EXCEPT 1 tablet on Sundays.  Coumadin Clinic [phone] Recheck INR 2 weeks - Call Coumadin Clinic when you have a procedure date to schedule Coumadin Clinic appt to have INR checked and receive Lovenox Bridge instructions.  Cardiac Clearance Fax # [phone] Coumadin Clinic [phone]

## 2022-10-18 ENCOUNTER — Other Ambulatory Visit (HOSPITAL_COMMUNITY): Payer: Self-pay

## 2022-10-18 MED ORDER — AZITHROMYCIN 250 MG PO TABS
ORAL_TABLET | ORAL | 0 refills | Status: AC
  Filled 2022-10-18: qty 6, 5d supply, fill #0

## 2022-10-19 ENCOUNTER — Other Ambulatory Visit: Payer: Self-pay

## 2022-10-19 NOTE — Telephone Encounter (Signed)
I s/w the

## 2022-10-19 NOTE — Telephone Encounter (Signed)
I s/w the pt and informed her that we still have yet to receive a clearance request from the new dental office that she states she is going to. I asked the pt what dental office is she going to now and I can try to get the request myself. She tells me it is [facility] on [address] in [place]. I googled the dental office and found a phone # [phone], Dr. Shellia Carwin, DDS.   I then called DDS and left message that we will need a clearance request to be faxed to our office before we can clear the pt. Pt is on Warfarin and may need Lovenox bridging, see previous notes where procedure was going to be done with another DDS office, though pt said they were too expensive. I left fax number [phone] attn: pre op team to fax a request to our office.

## 2022-10-22 ENCOUNTER — Other Ambulatory Visit (HOSPITAL_COMMUNITY): Payer: Self-pay

## 2022-10-22 ENCOUNTER — Telehealth: Payer: Self-pay | Admitting: *Deleted

## 2022-10-22 ENCOUNTER — Ambulatory Visit (INDEPENDENT_AMBULATORY_CARE_PROVIDER_SITE_OTHER): Payer: Medicaid Other | Admitting: Family Medicine

## 2022-10-22 ENCOUNTER — Encounter (INDEPENDENT_AMBULATORY_CARE_PROVIDER_SITE_OTHER): Payer: Self-pay | Admitting: Family Medicine

## 2022-10-22 VITALS — BP 123/79 | HR 69 | Temp 98.1°F | Ht 64.0 in | Wt 383.0 lb

## 2022-10-22 DIAGNOSIS — E559 Vitamin D deficiency, unspecified: Secondary | ICD-10-CM

## 2022-10-22 DIAGNOSIS — Z7901 Long term (current) use of anticoagulants: Secondary | ICD-10-CM

## 2022-10-22 DIAGNOSIS — I824Y9 Acute embolism and thrombosis of unspecified deep veins of unspecified proximal lower extremity: Secondary | ICD-10-CM

## 2022-10-22 DIAGNOSIS — R0602 Shortness of breath: Secondary | ICD-10-CM | POA: Diagnosis not present

## 2022-10-22 DIAGNOSIS — Z7984 Long term (current) use of oral hypoglycemic drugs: Secondary | ICD-10-CM

## 2022-10-22 DIAGNOSIS — E538 Deficiency of other specified B group vitamins: Secondary | ICD-10-CM | POA: Diagnosis not present

## 2022-10-22 DIAGNOSIS — E1169 Type 2 diabetes mellitus with other specified complication: Secondary | ICD-10-CM | POA: Diagnosis not present

## 2022-10-22 DIAGNOSIS — Z6841 Body Mass Index (BMI) 40.0 and over, adult: Secondary | ICD-10-CM

## 2022-10-22 DIAGNOSIS — F3289 Other specified depressive episodes: Secondary | ICD-10-CM

## 2022-10-22 DIAGNOSIS — E669 Obesity, unspecified: Secondary | ICD-10-CM

## 2022-10-22 DIAGNOSIS — Z7985 Long-term (current) use of injectable non-insulin antidiabetic drugs: Secondary | ICD-10-CM

## 2022-10-22 MED ORDER — RYBELSUS 3 MG PO TABS
3.0000 mg | ORAL_TABLET | Freq: Every day | ORAL | 0 refills | Status: DC
Start: 2022-10-22 — End: 2023-04-08
  Filled 2022-10-22: qty 90, 90d supply, fill #0

## 2022-10-22 MED ORDER — METFORMIN HCL 500 MG PO TABS
500.0000 mg | ORAL_TABLET | Freq: Two times a day (BID) | ORAL | 0 refills | Status: DC
Start: 2022-10-22 — End: 2022-11-20
  Filled 2022-10-22: qty 60, 30d supply, fill #0

## 2022-10-22 MED ORDER — VITAMIN D (ERGOCALCIFEROL) 1.25 MG (50000 UNIT) PO CAPS
50000.0000 [IU] | ORAL_CAPSULE | ORAL | 0 refills | Status: DC
Start: 2022-10-22 — End: 2022-11-20
  Filled 2022-10-22: qty 4, 28d supply, fill #0

## 2022-10-22 MED ORDER — CYANOCOBALAMIN 500 MCG PO TABS
ORAL_TABLET | ORAL | Status: DC
Start: 2022-10-22 — End: 2022-12-20

## 2022-10-22 MED ORDER — BUPROPION HCL ER (SR) 150 MG PO TB12
150.0000 mg | ORAL_TABLET | Freq: Every day | ORAL | 0 refills | Status: DC
Start: 2022-10-22 — End: 2022-11-20
  Filled 2022-10-22: qty 30, 30d supply, fill #0

## 2022-10-22 NOTE — Progress Notes (Signed)
.smr  Office: [phone]  /  Fax: [phone]  WEIGHT SUMMARY AND BIOMETRICS  Anthropometric Measurements Height: 5\' 4"  (1.626 m) Weight: (!) 383 lb (173.7 kg) BMI (Calculated): 65.71 Weight at Last Visit: 389 lb Weight Lost Since Last Visit: 6 Weight Gained Since Last Visit: 0 Starting Weight: 434 lb Total Weight Loss (lbs): 51 lb (23.1 kg)   Body Composition  Body Fat %: 69.5 % Fat Mass (lbs): 266.4 lbs Muscle Mass (lbs): 110.8 lbs Visceral Fat Rating : 32   Other Clinical Data Fasting: yes Labs: no Today's Visit #: 32 Starting Date: 06/22/20    Chief Complaint: OBESITY    History of Present Illness   The patient is a 47 year old individual with a history of obesity, type 2 diabetes, vitamin B12 deficiency, vitamin D deficiency, and depression with emotional eating behaviors. She report adherence to her category 3 plan approximately 85% of the time and have been engaging in regular exercise, walking for about 20 minutes five times per week. This has resulted in a weight loss of six pounds over the past six weeks.  The patient has been experiencing dental issues, specifically a problematic tooth that needs extraction. This has affected her ability to chew, but she has managed to maintain protein intake by cooking food thoroughly and chopping it into small pieces. She has also noticed that eating slower due to the dental issue has inadvertently helped with portion control.  The patient has been managing her diabetes with Rybelsus and metformin, and she report no significant issues with these medications, although she has occasionally missed doses. She has been taking vitamin D supplements weekly without any problems and are due to start on a regimen of 1000 mcg of B12 for her deficiency. She is also on Wellbutrin for her depression and emotional eating behaviors.  The patient has had some issues with fluid retention and shortness of breath, which was managed with Lasix.  She reports feeling comfortable at present, with no current shortness of breath.          PHYSICAL EXAM:  Blood pressure 123/79, pulse 69, temperature 98.1 F (36.7 C), height 5\' 4"  (1.626 m), weight (!) 383 lb (173.7 kg), SpO2 99%. Body mass index is 65.74 kg/m.  DIAGNOSTIC DATA REVIEWED:  BMET    Component Value Date/Time   NA 139 09/06/2022 1018   K 4.1 09/06/2022 1018   CL 103 09/06/2022 1018   CO2 24 09/06/2022 1018   GLUCOSE 92 09/06/2022 1018   GLUCOSE 102 (H) 06/29/2017 0515   BUN 11 09/06/2022 1018   CREATININE 0.75 09/06/2022 1018   CREATININE 0.84 05/24/2017 1117   CREATININE 0.90 01/28/2016 1626   CALCIUM 8.4 (L) 09/06/2022 1018   GFRNONAA 80 03/02/2020 1502   GFRNONAA >60 05/24/2017 1117   GFRNONAA 81 01/28/2016 1626   GFRAA 92 03/02/2020 1502   GFRAA >60 05/24/2017 1117   GFRAA >89 01/28/2016 1626   Lab Results  Component Value Date   HGBA1C 5.7 (H) 09/06/2022   HGBA1C 6.4 08/25/2014   Lab Results  Component Value Date   INSULIN 20.2 09/06/2022   INSULIN 20.3 06/22/2020   Lab Results  Component Value Date   TSH 1.490 04/11/2022   CBC    Component Value Date/Time   WBC 4.9 04/11/2022 1043   WBC 7.3 06/27/2017 0519   RBC 3.55 (L) 04/11/2022 1043   RBC 5.21 (H) 06/27/2017 0519   HGB 11.8 04/11/2022 1043   HCT 32.9 (L) 04/11/2022 1043  PLT 414 04/11/2022 1043   MCV 93 04/11/2022 1043   MCH 33.2 (H) 04/11/2022 1043   MCH 25.9 (L) 06/27/2017 0519   MCHC 35.9 (H) 04/11/2022 1043   MCHC 28.4 (L) 06/27/2017 0519   RDW 14.2 04/11/2022 1043   Iron Studies    Component Value Date/Time   IRON 38 (L) 10/08/2015 1505   TIBC 416 10/08/2015 1505   FERRITIN 27 10/08/2015 1505   IRONPCTSAT 9 (L) 10/08/2015 1505   Lipid Panel     Component Value Date/Time   CHOL 139 09/06/2022 1018   TRIG 95 09/06/2022 1018   HDL 41 09/06/2022 1018   CHOLHDL 3.8 11/06/2021 1055   CHOLHDL 4.6 01/28/2016 1626   VLDL 17 01/28/2016 1626   LDLCALC 80 09/06/2022  1018   LDLDIRECT 100 10/08/2015 1505   Hepatic Function Panel     Component Value Date/Time   PROT 6.7 09/06/2022 1018   ALBUMIN 3.9 09/06/2022 1018   AST 19 09/06/2022 1018   AST 24 05/24/2017 1117   ALT 16 09/06/2022 1018   ALT 21 05/24/2017 1117   ALKPHOS 46 09/06/2022 1018   BILITOT 0.4 09/06/2022 1018   BILITOT 0.5 05/24/2017 1117      Component Value Date/Time   TSH 1.490 04/11/2022 1043   Nutritional Lab Results  Component Value Date   VD25OH 33.8 09/06/2022   VD25OH 22.9 (L) 04/11/2022   VD25OH 23.5 (L) 10/11/2021     Assessment and Plan    Obesity: Patient has lost six pounds in the last six weeks, adhering to her category 3 plan approximately 85% of the time and walking for exercise 20 minutes five times per week. -Continue category 3 plan and walking regimen.  Type 2 Diabetes: Patient is on Rybelsus 3mg  daily and Metformin. No reported issues with medication. -Refill Rybelsus 3mg  daily and Metformin prescriptions. -Continue current medication regimen.  Vitamin B12 Deficiency: Patient has not been taking B12 supplements. -Start B12 daily. -Refill B12 prescription.  Vitamin D Deficiency: Patient is on Vitamin D 50,000 international units per week. -Refill Vitamin D prescription. -Continue current medication regimen.  Depression with Emotional Eating Behaviors: Patient is on Wellbutrin. No reported issues with medication. -Refill Wellbutrin prescription. -Continue current medication regimen.  Shortness of Breath on Exertion: Previous echocardiogram order was not covered by insurance. Patient is on Lasix and symptoms have improved. -Reorder echocardiogram with a  -Continue Lasix as prescribed.         I have personally spent 55 minutes total time today in preparation, patient care, and documentation for this visit, including the following: review of clinical lab tests; review of medical tests/procedures/services.    She was informed of the  importance of frequent follow up visits to maximize her success with intensive lifestyle modifications for her multiple health conditions.    Quillian Quince, MD

## 2022-10-22 NOTE — Telephone Encounter (Signed)
Left message to call back to set up tele pre op appt.  

## 2022-10-22 NOTE — Telephone Encounter (Signed)
   Name: Alexa [name]  DOB: Feb 03, 1976  MRN: [id number]  Primary Cardiologist: Nanetta Batty, MD   Preoperative team, please contact this patient and set up a phone call appointment for further preoperative risk assessment. Please obtain consent and complete medication review. Thank you for your help.  I confirm that guidance regarding antiplatelet and oral anticoagulation therapy has been completed and, if necessary, noted below.  Patient does NOT require pre-op antibiotics for dental procedure.   Per office protocol, patient can hold warfarin for 5 days prior to procedure.   Patient WILL need bridging with Lovenox (enoxaparin) around procedure.   Napoleon Form, Leodis Rains, NP 10/22/2022, 2:28 PM [facility]

## 2022-10-22 NOTE — Telephone Encounter (Signed)
Pharmacy please advise on holding Coumadin  prior to surgical extraction of two teeth  scheduled for TBD. Thank you.

## 2022-10-22 NOTE — Telephone Encounter (Signed)
   Pre-operative Risk Assessment    Patient Name: Alexa [name]  DOB: 02/25/1976 MRN: [id number]     Request for Surgical Clearance    Procedure:  Dental Extraction - Amount of Teeth to be Pulled:  2 TEETH TO BE SURGICALLY EXTRACTED   Date of Surgery:  Clearance TBD                                 Surgeon:  DR. Arnetha Courser Surgeon's Group or Practice Name:  [facility] Phone number:  [phone] Fax number:  [phone]   Type of Clearance Requested:   - Medical  - Pharmacy:  Hold Warfarin (Coumadin)     Type of Anesthesia:   NITROUS WITH LIDOCAINE   Additional requests/questions:    Elpidio Anis   10/22/2022, 1:07 PM

## 2022-10-22 NOTE — Telephone Encounter (Signed)
Patient with diagnosis of afib and PE on warfarin for anticoagulation.    Procedure: 2 TEETH TO BE SURGICALLY EXTRACTED  Date of procedure: TBD   CHA2DS2-VASc Score = 4   This indicates a 4.8% annual risk of stroke. The patient's score is based upon: CHF History: 0 HTN History: 1 Diabetes History: 0 Stroke History: 2 (hx of DVT and PE) Vascular Disease History: 0 Age Score: 0 Gender Score: 1      CrCl >100 ml/min Platelet count ?  Patient does NOT require pre-op antibiotics for dental procedure.  Per office protocol, patient can hold warfarin for 5 days prior to procedure.   Patient WILL need bridging with Lovenox (enoxaparin) around procedure.  Patient will need CBC. She can get when she goes to Coumadin clinic for bridge. Order placed.  **This guidance is not considered finalized until pre-operative APP has relayed final recommendations.**

## 2022-10-22 NOTE — Addendum Note (Signed)
Addended by: Malena Peer D on: 10/22/2022 02:16 PM   Modules accepted: Orders

## 2022-10-23 ENCOUNTER — Other Ambulatory Visit (HOSPITAL_COMMUNITY): Payer: Self-pay

## 2022-10-23 ENCOUNTER — Ambulatory Visit: Payer: Medicaid Other | Admitting: Family Medicine

## 2022-10-23 ENCOUNTER — Encounter: Payer: Self-pay | Admitting: Family Medicine

## 2022-10-23 VITALS — BP 108/72 | HR 82 | Temp 97.7°F | Resp 16 | Wt 392.6 lb

## 2022-10-23 DIAGNOSIS — I10 Essential (primary) hypertension: Secondary | ICD-10-CM | POA: Diagnosis not present

## 2022-10-23 DIAGNOSIS — R600 Localized edema: Secondary | ICD-10-CM

## 2022-10-23 DIAGNOSIS — E6609 Other obesity due to excess calories: Secondary | ICD-10-CM

## 2022-10-23 DIAGNOSIS — E876 Hypokalemia: Secondary | ICD-10-CM

## 2022-10-23 DIAGNOSIS — Z6841 Body Mass Index (BMI) 40.0 and over, adult: Secondary | ICD-10-CM

## 2022-10-23 DIAGNOSIS — E1169 Type 2 diabetes mellitus with other specified complication: Secondary | ICD-10-CM

## 2022-10-23 DIAGNOSIS — Z7984 Long term (current) use of oral hypoglycemic drugs: Secondary | ICD-10-CM

## 2022-10-23 MED ORDER — POTASSIUM CHLORIDE CRYS ER 10 MEQ PO TBCR
10.0000 meq | EXTENDED_RELEASE_TABLET | Freq: Two times a day (BID) | ORAL | 5 refills | Status: DC
Start: 2022-10-23 — End: 2023-05-06
  Filled 2022-10-23: qty 60, 30d supply, fill #0
  Filled 2022-12-04: qty 60, 30d supply, fill #1
  Filled 2022-12-31 – 2023-01-01 (×2): qty 60, 30d supply, fill #2
  Filled 2023-02-05 – 2023-02-07 (×2): qty 60, 30d supply, fill #3
  Filled 2023-03-29: qty 60, 30d supply, fill #4

## 2022-10-23 MED ORDER — FUROSEMIDE 40 MG PO TABS
40.0000 mg | ORAL_TABLET | Freq: Two times a day (BID) | ORAL | 1 refills | Status: DC
  Filled 2022-10-23 – 2023-02-14 (×6): qty 180, 90d supply, fill #0

## 2022-10-23 NOTE — Telephone Encounter (Signed)
2nd attempt to reach pt regarding surgical clearance and the need for a tele-visit.  Left pt a message to call back and ask for the preop team.

## 2022-10-24 ENCOUNTER — Other Ambulatory Visit (HOSPITAL_COMMUNITY): Payer: Self-pay

## 2022-10-24 ENCOUNTER — Other Ambulatory Visit: Payer: Self-pay

## 2022-10-24 NOTE — Telephone Encounter (Signed)
Our office has tried x 3 to reach the pt to set up tele pre op appt. I had received a message from the coumadin nurse Cammy Copa today who said that the pt asked for me to call her in the afternoon. I will update the requesting office pt needs to set up tele pre op appt.

## 2022-10-25 ENCOUNTER — Encounter: Payer: Self-pay | Admitting: Family Medicine

## 2022-10-25 ENCOUNTER — Telehealth: Payer: Self-pay | Admitting: *Deleted

## 2022-10-25 ENCOUNTER — Encounter (INDEPENDENT_AMBULATORY_CARE_PROVIDER_SITE_OTHER): Payer: Self-pay | Admitting: Family Medicine

## 2022-10-25 ENCOUNTER — Telehealth (INDEPENDENT_AMBULATORY_CARE_PROVIDER_SITE_OTHER): Payer: Self-pay | Admitting: Family Medicine

## 2022-10-25 NOTE — Telephone Encounter (Signed)
Returned the call and was placed on hold. Stayed on hold for about 5-10 minutes then I disconnected the call.

## 2022-10-25 NOTE — Telephone Encounter (Signed)
Spoke with office again and a note was sent back to Dr. Francena Hanly nurse regarding the prior authorization-----informed the office they can update the order note with the authorization information

## 2022-10-25 NOTE — Telephone Encounter (Signed)
Pt called back and has been scheduled for tele pre op appt 10/30/22 @ 10:40. Ok per Carlos Levering, NP to add pt on to 10/30/22 over book slot.   Med rec and consent are done.      Patient Consent for Virtual Visit        Alexa [name] has provided verbal consent on 10/25/2022 for a virtual visit (video or telephone).   CONSENT FOR VIRTUAL VISIT FOR:  Alexa [name]  By participating in this virtual visit I agree to the following:  I hereby voluntarily request, consent and authorize [facility] and its employed or contracted physicians, physician assistants, nurse practitioners or other licensed health care professionals (the Practitioner), to provide me with telemedicine health care services (the "Services") as deemed necessary by the treating Practitioner. I acknowledge and consent to receive the Services by the Practitioner via telemedicine. I understand that the telemedicine visit will involve communicating with the Practitioner through live audiovisual communication technology and the disclosure of certain medical information by electronic transmission. I acknowledge that I have been given the opportunity to request an in-person assessment or other available alternative prior to the telemedicine visit and am voluntarily participating in the telemedicine visit.  I understand that I have the right to withhold or withdraw my consent to the use of telemedicine in the course of my care at any time, without affecting my right to future care or treatment, and that the Practitioner or I may terminate the telemedicine visit at any time. I understand that I have the right to inspect all information obtained and/or recorded in the course of the telemedicine visit and may receive copies of available information for a reasonable fee.  I understand that some of the potential risks of receiving the Services via telemedicine include:  Delay or interruption in medical evaluation due to  technological equipment failure or disruption; Information transmitted may not be sufficient (e.g. poor resolution of images) to allow for appropriate medical decision making by the Practitioner; and/or  In rare instances, security protocols could fail, causing a breach of personal health information.  Furthermore, I acknowledge that it is my responsibility to provide information about my medical history, conditions and care that is complete and accurate to the best of my ability. I acknowledge that Practitioner's advice, recommendations, and/or decision may be based on factors not within their control, such as incomplete or inaccurate data provided by me or distortions of diagnostic images or specimens that may result from electronic transmissions. I understand that the practice of medicine is not an exact science and that Practitioner makes no warranties or guarantees regarding treatment outcomes. I acknowledge that a copy of this consent can be made available to me via my patient portal [facility] MyChart), or I can request a printed copy by calling the office of [facility].    I understand that my insurance will be billed for this visit.   I have read or had this consent read to me. I understand the contents of this consent, which adequately explains the benefits and risks of the Services being provided via telemedicine.  I have been provided ample opportunity to ask questions regarding this consent and the Services and have had my questions answered to my satisfaction. I give my informed consent for the services to be provided through the use of telemedicine in my medical care

## 2022-10-25 NOTE — Progress Notes (Signed)
Established Patient Office Visit  Subjective    Patient ID: Alexa [name], female    DOB: 12/04/1975  Age: 47 y.o. MRN: [id number]  CC: No chief complaint on file.   HPI ONG BAES presents for follow up of chronic med issues.    Outpatient Encounter Medications as of 10/23/2022  Medication Sig   acetaminophen (TYLENOL) 500 MG tablet Take 1,000 mg by mouth daily as needed for mild pain.   [EXPIRED] azithromycin (ZITHROMAX) 250 MG tablet Take 2 tablets (500 mg total) by mouth daily for 1 day, THEN 1 tablet (250 mg total) daily for 4 days.   blood glucose meter kit and supplies KIT Use up to four times daily as directed.   buPROPion (WELLBUTRIN SR) 150 MG 12 hr tablet Take 1 tablet (150 mg total) by mouth daily.   cetirizine (ZYRTEC) 10 MG tablet Take 1 tablet (10 mg total) by mouth daily.   cyanocobalamin (VITAMIN B12) 500 MCG tablet [phone] mcg once daily   diazepam (VALIUM) 10 MG tablet Take 1 by mouth 1/2-1 hour before procedure. May repeat in 2-4 hours if necessary.   gabapentin (NEURONTIN) 300 MG capsule Take 1 capsule (300 mg total) by mouth 2 (two) times daily.   glucose blood test strip ascensia  Meter/strips preferred Test 3x daily Use as instructed Dx.dm   Lancets (ONETOUCH ULTRASOFT) lancets Test daily Use as instructed dm2   metFORMIN (GLUCOPHAGE) 500 MG tablet Take 1 tablet (500 mg total) by mouth 2 (two) times daily with a meal.   Misc. Devices MISC Bariatric size shower chair with handle 423 lbs, BMI 72  Dx. Pulmonary HTN, DM2 with neuropathy, morbid obesity   Semaglutide (RYBELSUS) 3 MG TABS Take 1 tablet (3 mg total) by mouth daily with breakfast.   Vitamin D, Ergocalciferol, (DRISDOL) 1.25 MG (50000 UNIT) CAPS capsule Take 1 capsule (50,000 Units total) by mouth every 7 (seven) days.   warfarin (COUMADIN) 7.5 MG tablet Take 1 to 1.5 tablets by mouth daily or as directed by Coumadin clinic   [DISCONTINUED] furosemide (LASIX) 40 MG tablet Take 1 tablet (40  mg total) by mouth 2 (two) times daily.   [DISCONTINUED] potassium chloride (KLOR-CON M) 10 MEQ tablet Take 1 tablet (10 mEq total) by mouth 2 (two) times daily.   furosemide (LASIX) 40 MG tablet Take 1 tablet (40 mg total) by mouth 2 (two) times daily.   potassium chloride (KLOR-CON M) 10 MEQ tablet Take 1 tablet (10 mEq total) by mouth 2 (two) times daily.   No facility-administered encounter medications on file as of 10/23/2022.    Past Medical History:  Diagnosis Date   Anemia    Back pain    Bilateral swelling of feet    CHF (congestive heart failure) (HCC)    Diabetes mellitus without complication (HCC)    Phreesia 03/02/2020   DM2 (diabetes mellitus, type 2) (HCC)    Hypertension    Joint pain    Nocturnal hypoxia    Obstructive sleep apnea syndrome, moderate    Pulmonary embolism (HCC) 05/14/2016   submassive, on chronic anticoagulation   Pulmonary hypertension (HCC)    Vitamin D deficiency     Past Surgical History:  Procedure Laterality Date   CESAREAN SECTION      Family History  Problem Relation Age of Onset   Diabetes Maternal Grandmother    Breast cancer Maternal Grandmother        breast cancer    Brain cancer Maternal Grandmother  Hypertension Mother    Obesity Mother    Heart disease Father    Obesity Father    Breast cancer Maternal Aunt 41   Breast cancer Maternal Aunt 29    Social History   Socioeconomic History   Marital status: Divorced    Spouse name: n/a   Number of children: 2   Years of education: Not on file   Highest education level: Not on file  Occupational History   Occupation: [employer]  Tobacco Use   Smoking status: Former    Types: Cigarettes   Smokeless tobacco: Never   Tobacco comments:    only smokes when she drinks alcohol - 1 pack per week  Vaping Use   Vaping status: Never Used  Substance and Sexual Activity   Alcohol use: Yes    Alcohol/week: 4.0 - 6.0 standard drinks of alcohol    Types: 4 - 6  Standard drinks or equivalent per week    Comment: mixed drinks 3-4 times/weeks   Drug use: No   Sexual activity: Not Currently  Other Topics Concern   Not on file  Social History Narrative   Her children live with her.   Ex-husband lives locally.   Social Determinants of Health   Financial Resource Strain: Not on file  Food Insecurity: Not on file  Transportation Needs: Not on file  Physical Activity: Not on file  Stress: Not on file  Social Connections: Not on file  Intimate Partner Violence: Not on file    Review of Systems  All other systems reviewed and are negative.       Objective    BP 108/72   Pulse 82   Temp 97.7 F (36.5 C) (Oral)   Resp 16   Wt (!) 392 lb 9.6 oz (178.1 kg)   SpO2 93%   BMI 67.39 kg/m   Physical Exam Vitals and nursing note reviewed.  Constitutional:      General: She is not in acute distress.    Appearance: She is obese.  Cardiovascular:     Rate and Rhythm: Normal rate and regular rhythm.  Pulmonary:     Effort: Pulmonary effort is normal.     Breath sounds: Normal breath sounds.  Neurological:     General: No focal deficit present.     Mental Status: She is alert and oriented to person, place, and time.         Assessment & Plan:   1. Essential hypertension Appears stable. Continue   2. Type 2 diabetes mellitus with other specified complication, unspecified whether long term insulin use (HCC) Continue   3. BMI 60.0-69.9, adult (HCC)   4. Bilateral lower extremity edema  - furosemide (LASIX) 40 MG tablet; Take 1 tablet (40 mg total) by mouth 2 (two) times daily.  Dispense: 180 tablet; Refill: 1  5. Decreased potassium in the blood  - potassium chloride (KLOR-CON M) 10 MEQ tablet; Take 1 tablet (10 mEq total) by mouth 2 (two) times daily.  Dispense: 60 tablet; Refill: 5  Return in about 6 months (around 04/25/2023) for follow up.   Tommie Raymond, MD

## 2022-10-25 NOTE — Telephone Encounter (Signed)
8/1 [facility] is calling to inquire about the status of an Echo Cardio Prior Authorization. Please advise. JE

## 2022-10-25 NOTE — Telephone Encounter (Signed)
Pt called back and has been scheduled for tele pre op appt 10/30/22 @ 10:40. Ok per Alexa Levering, NP to add pt on to 10/30/22 over book slot.   Med rec and consent are done.

## 2022-10-30 ENCOUNTER — Ambulatory Visit: Payer: Medicaid Other | Attending: Internal Medicine | Admitting: Student

## 2022-10-30 DIAGNOSIS — Z01810 Encounter for preprocedural cardiovascular examination: Secondary | ICD-10-CM

## 2022-10-30 NOTE — Progress Notes (Signed)
Virtual Visit via Telephone Note   Because of Alexa [name]'s co-morbid illnesses, she is at least at moderate risk for complications without adequate follow up.  This format is felt to be most appropriate for this patient at this time.  The patient did not have access to video technology/had technical difficulties with video requiring transitioning to audio format only (telephone).  All issues noted in this document were discussed and addressed.  No physical exam could be performed with this format.  Please refer to the patient's chart for her consent to telehealth for [facility].  Evaluation Performed:  Preoperative cardiovascular risk assessment _____________   Date:  10/30/2022   Patient ID:  Alexa [name], DOB 1975-07-10, MRN [id number] Patient Location:  Home Provider location:   Office  Primary Care Provider:  Georganna Skeans, MD Primary Cardiologist:  Nanetta Batty, MD  Chief Complaint / Patient Profile   47 y.o. y/o female with a h/o chronic diastolic heart failure/pulmonary hypertension, aortic thrombus, PE/DVT on anticoagulation, hypertension, hyperlipidemia, T2DM, OSA who is pending surgical extraction of 2 teeth by Dr. Shellia Carwin and presents today for telephonic preoperative cardiovascular risk assessment.  History of Present Illness    Alexa [name] is a 47 y.o. female who presents via audio/video conferencing for a telehealth visit today.  Pt was last seen in cardiology clinic on 04/03/2022 by Dr. Allyson Sabal.  At that time Alexa [name] was stable from a cardiac standpoint.  The patient is now pending procedure as outlined above. Since her last visit, she is doing well. Patient denies shortness of breath or dyspnea on exertion. No chest pain, pressure, or tightness. Denies orthopnea or PND. Patient reports chronic lower extremity edema that is at its best in the morning and progresses throughout the day. No palpitations.  She walks a minimum of 15 minutes most  days of the week and performs stretches at home.   Past Medical History    Past Medical History:  Diagnosis Date   Anemia    Back pain    Bilateral swelling of feet    CHF (congestive heart failure) (HCC)    Diabetes mellitus without complication (HCC)    Phreesia 03/02/2020   DM2 (diabetes mellitus, type 2) (HCC)    Hypertension    Joint pain    Nocturnal hypoxia    Obstructive sleep apnea syndrome, moderate    Pulmonary embolism (HCC) 05/14/2016   submassive, on chronic anticoagulation   Pulmonary hypertension (HCC)    Vitamin D deficiency    Past Surgical History:  Procedure Laterality Date   CESAREAN SECTION      Allergies  Allergies  Allergen Reactions   Penicillins Hives    Has patient had a PCN reaction causing immediate rash, facial/tongue/throat swelling, SOB or lightheadedness with hypotension: No Has patient had a PCN reaction causing severe rash involving mucus membranes or skin necrosis: No Has patient had a PCN reaction that required hospitalization No Has patient had a PCN reaction occurring within the last 10 years: No If all of the above answers are "NO", then may proceed with Cephalosporin use.    Home Medications    Prior to Admission medications   Medication Sig Start Date End Date Taking? Authorizing Provider  acetaminophen (TYLENOL) 500 MG tablet Take 1,000 mg by mouth daily as needed for mild pain.    [provider]  blood glucose meter kit and supplies KIT Use up to four times daily as directed. 06/14/20   Kateri Plummer,  Richard, NP  buPROPion (WELLBUTRIN SR) 150 MG 12 hr tablet Take 1 tablet (150 mg total) by mouth daily. 10/22/22   Quillian Quince D, MD  cetirizine (ZYRTEC) 10 MG tablet Take 1 tablet (10 mg total) by mouth daily. Patient not taking: Reported on 10/25/2022 12/11/16   Trena Platt D, PA  cyanocobalamin (VITAMIN B12) 500 MCG tablet [phone] mcg once daily 10/22/22   Quillian Quince D, MD  diazepam (VALIUM) 10 MG tablet Take 1  by mouth 1/2-1 hour before procedure. May repeat in 2-4 hours if necessary. Patient not taking: Reported on 10/25/2022 04/03/22   Georganna Skeans, MD  furosemide (LASIX) 40 MG tablet Take 1 tablet (40 mg total) by mouth 2 (two) times daily. 10/23/22   Georganna Skeans, MD  gabapentin (NEURONTIN) 300 MG capsule Take 1 capsule (300 mg total) by mouth 2 (two) times daily. Patient not taking: Reported on 10/25/2022 07/12/22   Quillian Quince D, MD  glucose blood test strip ascensia  Meter/strips preferred Test 3x daily Use as instructed Dx.dm 08/25/20   Quillian Quince D, MD  Lancets [facility] ULTRASOFT) lancets Test daily Use as instructed dm2 06/17/20   Janeece Agee, NP  metFORMIN (GLUCOPHAGE) 500 MG tablet Take 1 tablet (500 mg total) by mouth 2 (two) times daily with a meal. 10/22/22   Wilder Glade, MD  Misc. Devices MISC Bariatric size shower chair with handle 423 lbs, BMI 72  Dx. Pulmonary HTN, DM2 with neuropathy, morbid obesity 11/13/17   Lezlie Lye, Meda Coffee, MD  potassium chloride (KLOR-CON M) 10 MEQ tablet Take 1 tablet (10 mEq total) by mouth 2 (two) times daily. 10/23/22   Georganna Skeans, MD  Semaglutide (RYBELSUS) 3 MG TABS Take 1 tablet (3 mg total) by mouth daily with breakfast. 10/22/22   Quillian Quince D, MD  Vitamin D, Ergocalciferol, (DRISDOL) 1.25 MG (50000 UNIT) CAPS capsule Take 1 capsule (50,000 Units total) by mouth every 7 (seven) days. 10/22/22   Quillian Quince D, MD  warfarin (COUMADIN) 7.5 MG tablet Take 1 to 1.5 tablets by mouth daily or as directed by Coumadin clinic 10/09/22   Runell Gess, MD    Physical Exam    Vital Signs:  Alexa [name] does not have vital signs available for review today.  Given telephonic nature of communication, physical exam is limited. AAOx3. NAD. Normal affect.  Speech and respirations are unlabored.  Accessory Clinical Findings    None  Assessment & Plan    Primary Cardiologist: Nanetta Batty, MD  Preoperative cardiovascular risk  assessment.  Surgical extraction of 2 teeth by Dr. Shellia Carwin.  Chart reviewed as part of pre-operative protocol coverage. According to the RCRI, patient has a 0.4% risk of MACE. Patient reports activity equivalent to 4.0 METS (walks a minimum of 15 minutes daily, performs stretching at home).   Given past medical history and time since last visit, based on ACC/AHA guidelines, NIOBE FRIIS would be at acceptable risk for the planned procedure without further cardiovascular testing.   Patient was advised that if she develops new symptoms prior to surgery to contact our office to arrange a follow-up appointment.  she verbalized understanding.  Per Pharm.D., "Patient does NOT require pre-op antibiotics for dental procedure.  Per office protocol, patient can hold warfarin for 5 days prior to procedure.   Patient WILL need bridging with Lovenox (enoxaparin) around procedure." Patient has a visit with Coumadin clinic tomorrow, 8/7.  I will route this recommendation to the requesting party via Epic  fax function.  Please call with questions.  Time:   Today, I have spent 6 minutes with the patient with telehealth technology discussing medical history, symptoms, and management plan.     Carlos Levering, NP  10/30/2022, 8:00 AM

## 2022-10-31 ENCOUNTER — Other Ambulatory Visit: Payer: Self-pay

## 2022-10-31 ENCOUNTER — Other Ambulatory Visit (HOSPITAL_COMMUNITY): Payer: Self-pay

## 2022-10-31 ENCOUNTER — Ambulatory Visit (INDEPENDENT_AMBULATORY_CARE_PROVIDER_SITE_OTHER): Payer: Medicaid Other

## 2022-10-31 DIAGNOSIS — Z7901 Long term (current) use of anticoagulants: Secondary | ICD-10-CM

## 2022-10-31 DIAGNOSIS — I4891 Unspecified atrial fibrillation: Secondary | ICD-10-CM | POA: Diagnosis not present

## 2022-10-31 LAB — POCT INR: INR: 2.5 (ref 2.0–3.0)

## 2022-10-31 MED ORDER — ENOXAPARIN SODIUM 80 MG/0.8ML IJ SOSY
160.0000 mg | PREFILLED_SYRINGE | Freq: Two times a day (BID) | INTRAMUSCULAR | 1 refills | Status: DC
Start: 2022-10-31 — End: 2023-02-06
  Filled 2022-10-31: qty 32, 10d supply, fill #0
  Filled 2023-01-12: qty 32, 10d supply, fill #1

## 2022-10-31 NOTE — Telephone Encounter (Signed)
Left mother a message stating that patient can come in to get labs drawn at anytime before his appointment.

## 2022-10-31 NOTE — Patient Instructions (Addendum)
Description   Continue taking Warfarin 1.5 tablets daily EXCEPT 1 tablet on Sundays. On 8/9 follow pre/post procedure instructions.  Coumadin Clinic [phone] Recheck INR 1 week post procedure  [facility] Fax # [phone] Coumadin Clinic [phone]      8/8: Last dose of warfarin.  8/9: No warfarin or enoxaparin (Lovenox).  8/10: Inject enoxaparin 160mg  (2 injections)  in the fatty abdominal tissue at least 2 inches from the belly button twice a day about 12 hours apart, 8am and 8pm rotate sites. No warfarin.  8/11: Inject enoxaparin in the fatty tissue every 12 hours, 8am and 8pm. No warfarin.  8/12: Inject enoxaparin in the fatty tissue every 12 hours, 8am and 8pm. No warfarin.  8/13: Inject enoxaparin in the fatty tissue in the morning at 8 am (No PM dose). No warfarin.  8/14: Procedure Day - No enoxaparin - Resume warfarin in the evening or as directed by doctor (take an extra half tablet with usual dose for 2 days then resume normal dose).  8/15: Resume enoxaparin inject in the fatty tissue every 12 hours and take warfarin  8/16: Inject enoxaparin in the fatty tissue every 12 hours and take warfarin  8/17: Inject enoxaparin in the fatty tissue every 12 hours and take warfarin  8/18: Inject enoxaparin in the fatty tissue every 12 hours and take warfarin  8/19: Inject enoxaparin in the fatty tissue every 12 hours and take warfarin  8/20: Inject enoxaparin in the fatty tissue every 12 hours and take warfarin  8/21: Injection enoxaparin and warfarin appt to check INR.

## 2022-10-31 NOTE — Telephone Encounter (Signed)
This encounter was created in error - please disregard.

## 2022-11-01 ENCOUNTER — Ambulatory Visit (INDEPENDENT_AMBULATORY_CARE_PROVIDER_SITE_OTHER): Payer: Medicaid Other | Admitting: Family Medicine

## 2022-11-07 ENCOUNTER — Other Ambulatory Visit (HOSPITAL_COMMUNITY): Payer: Self-pay

## 2022-11-07 MED ORDER — HYDROCODONE-ACETAMINOPHEN 5-325 MG PO TABS
1.0000 | ORAL_TABLET | Freq: Three times a day (TID) | ORAL | 0 refills | Status: DC | PRN
  Filled 2022-11-07 – 2022-11-08 (×2): qty 8, 3d supply, fill #0

## 2022-11-07 MED ORDER — CHLORHEXIDINE GLUCONATE 0.12 % MT SOLN
Freq: Two times a day (BID) | OROMUCOSAL | 0 refills | Status: AC
  Filled 2022-11-07 – 2022-11-08 (×2): qty 473, 10d supply, fill #0

## 2022-11-07 MED ORDER — AZITHROMYCIN 250 MG PO TABS
ORAL_TABLET | ORAL | 0 refills | Status: DC
  Filled 2022-11-07 – 2022-11-08 (×2): qty 6, 5d supply, fill #0

## 2022-11-08 ENCOUNTER — Other Ambulatory Visit (HOSPITAL_COMMUNITY): Payer: Self-pay

## 2022-11-08 ENCOUNTER — Other Ambulatory Visit: Payer: Self-pay

## 2022-11-14 ENCOUNTER — Ambulatory Visit: Payer: Medicaid Other | Attending: Cardiology | Admitting: *Deleted

## 2022-11-14 DIAGNOSIS — I4891 Unspecified atrial fibrillation: Secondary | ICD-10-CM

## 2022-11-14 DIAGNOSIS — Z5181 Encounter for therapeutic drug level monitoring: Secondary | ICD-10-CM

## 2022-11-14 DIAGNOSIS — Z7901 Long term (current) use of anticoagulants: Secondary | ICD-10-CM

## 2022-11-14 LAB — POCT INR: INR: 1.8 — AB (ref 2.0–3.0)

## 2022-11-14 NOTE — Patient Instructions (Signed)
Description   Continue lovenox until finish today's doses. Today take 2 tablets of warfarin then continue taking Warfarin 1.5 tablets daily EXCEPT 1 tablet on Sundays. Coumadin Clinic [phone] Recheck INR 1 week.  [facility] Fax # [phone] Coumadin Clinic [phone]

## 2022-11-20 ENCOUNTER — Encounter (INDEPENDENT_AMBULATORY_CARE_PROVIDER_SITE_OTHER): Payer: Self-pay | Admitting: Family Medicine

## 2022-11-20 ENCOUNTER — Other Ambulatory Visit: Payer: Self-pay

## 2022-11-20 ENCOUNTER — Other Ambulatory Visit (HOSPITAL_COMMUNITY): Payer: Self-pay

## 2022-11-20 ENCOUNTER — Ambulatory Visit (INDEPENDENT_AMBULATORY_CARE_PROVIDER_SITE_OTHER): Payer: Medicaid Other | Admitting: Family Medicine

## 2022-11-20 VITALS — BP 132/83 | HR 82 | Temp 98.0°F | Ht 64.0 in | Wt 377.0 lb

## 2022-11-20 DIAGNOSIS — F3289 Other specified depressive episodes: Secondary | ICD-10-CM | POA: Diagnosis not present

## 2022-11-20 DIAGNOSIS — Z6841 Body Mass Index (BMI) 40.0 and over, adult: Secondary | ICD-10-CM

## 2022-11-20 DIAGNOSIS — E1169 Type 2 diabetes mellitus with other specified complication: Secondary | ICD-10-CM | POA: Diagnosis not present

## 2022-11-20 DIAGNOSIS — E669 Obesity, unspecified: Secondary | ICD-10-CM | POA: Diagnosis not present

## 2022-11-20 DIAGNOSIS — E559 Vitamin D deficiency, unspecified: Secondary | ICD-10-CM

## 2022-11-20 DIAGNOSIS — Z7985 Long-term (current) use of injectable non-insulin antidiabetic drugs: Secondary | ICD-10-CM

## 2022-11-20 MED ORDER — VITAMIN D (ERGOCALCIFEROL) 1.25 MG (50000 UNIT) PO CAPS
50000.0000 [IU] | ORAL_CAPSULE | ORAL | 0 refills | Status: DC
Start: 2022-11-20 — End: 2023-01-23
  Filled 2022-11-20: qty 4, 28d supply, fill #0

## 2022-11-20 MED ORDER — METFORMIN HCL 500 MG PO TABS
500.0000 mg | ORAL_TABLET | Freq: Two times a day (BID) | ORAL | 0 refills | Status: DC
  Filled 2022-11-20: qty 60, 30d supply, fill #0

## 2022-11-20 MED ORDER — BUPROPION HCL ER (SR) 150 MG PO TB12
150.0000 mg | ORAL_TABLET | Freq: Every day | ORAL | 0 refills | Status: DC
Start: 2022-11-20 — End: 2022-12-20
  Filled 2022-11-20: qty 30, 30d supply, fill #0

## 2022-11-21 ENCOUNTER — Ambulatory Visit: Payer: Medicaid Other | Attending: Cardiology

## 2022-11-21 DIAGNOSIS — Z5181 Encounter for therapeutic drug level monitoring: Secondary | ICD-10-CM | POA: Diagnosis not present

## 2022-11-21 DIAGNOSIS — Z7901 Long term (current) use of anticoagulants: Secondary | ICD-10-CM | POA: Diagnosis not present

## 2022-11-21 LAB — POCT INR: INR: 2 (ref 2.0–3.0)

## 2022-11-21 NOTE — Progress Notes (Signed)
Chief Complaint:   OBESITY Alexa [name] is here to discuss her progress with her obesity treatment plan along with follow-up of her obesity related diagnoses. Alexa [name] is on the Category 3 Plan and states she is following her eating plan approximately 90% of the time. Alexa [name] states she is walking and dancing for 15 minutes 4 times per week.  Today's visit was #: 33 Starting weight: 434 lbs Starting date: 06/22/2020 Today's weight: 377 lbs Today's date: 11/20/2022 Total lbs lost to date: 57 Total lbs lost since last in-office visit: 6  Interim History: Patient has done especially well with her weight loss.  She is working on meal planning and eating at home.  She is open to looking at other meal planning options for dinner.  Subjective:   1. Vitamin D deficiency Patient's vitamin D level is not yet at goal.  She is on vitamin D prescription.  2. Type 2 diabetes mellitus with other specified complication, unspecified whether long term insulin use (HCC) Patient is working on her diet and exercise. No side effects were noted, but she notes decreased polyphagia.   3. Emotional Eating Behavior Patient has done better with decreasing emotional eating behavior.  She has no increase in blood pressure or insomnia.  Assessment/Plan:   1. Vitamin D deficiency Patient will continue prescription vitamin D, and we will refill for 1 month.  - Vitamin D, Ergocalciferol, (DRISDOL) 1.25 MG (50000 UNIT) CAPS capsule; Take 1 capsule (50,000 Units total) by mouth every 7 (seven) days.  Dispense: 4 capsule; Refill: 0  2. Type 2 diabetes mellitus with other specified complication, unspecified whether long term insulin use (HCC) Patient will continue Rybelsus and metformin, and we will refill metformin for 1 month.  - metFORMIN (GLUCOPHAGE) 500 MG tablet; Take 1 tablet (500 mg total) by mouth 2 (two) times daily with a meal.  Dispense: 60 tablet; Refill: 0  3. Emotional Eating Behavior Patient will  continue Wellbutrin SR 150 mg once daily, and we will refill for 1 month.  - buPROPion (WELLBUTRIN SR) 150 MG 12 hr tablet; Take 1 tablet (150 mg total) by mouth daily.  Dispense: 30 tablet; Refill: 0  4. BMI 60.0-69.9, adult (HCC)  5. Obesity with starting BMI of 74.5 Alexa [name] is currently in the action stage of change. As such, her goal is to continue with weight loss efforts. She has agreed to the Category 3 Plan and keeping a food journal and adhering to recommended goals of 400-600 calories and 40+ grams of protein at supper daily.   Patient was shown how to use Chat GPT to meal plan.  Exercise goals: As is.   Behavioral modification strategies: increasing lean protein intake and no skipping meals.  Alexa [name] has agreed to follow-up with our clinic in 4 weeks. She was informed of the importance of frequent follow-up visits to maximize her success with intensive lifestyle modifications for her multiple health conditions.   Objective:   Blood pressure 132/83, pulse 82, temperature 98 F (36.7 C), height 5\' 4"  (1.626 m), weight (!) 377 lb (171 kg), SpO2 96%. Body mass index is 64.71 kg/m.  Lab Results  Component Value Date   CREATININE 0.75 09/06/2022   BUN 11 09/06/2022   NA 139 09/06/2022   K 4.1 09/06/2022   CL 103 09/06/2022   CO2 24 09/06/2022   Lab Results  Component Value Date   ALT 16 09/06/2022   AST 19 09/06/2022   ALKPHOS 46 09/06/2022   BILITOT 0.4  09/06/2022   Lab Results  Component Value Date   HGBA1C 5.7 (H) 09/06/2022   HGBA1C 5.6 04/11/2022   HGBA1C 5.8 (H) 10/11/2021   HGBA1C 5.6 02/08/2021   HGBA1C 6.2 (H) 11/08/2020   Lab Results  Component Value Date   INSULIN 20.2 09/06/2022   INSULIN 16.0 04/11/2022   INSULIN 17.2 10/11/2021   INSULIN 17.6 02/08/2021   INSULIN 3.9 11/08/2020   Lab Results  Component Value Date   TSH 1.490 04/11/2022   Lab Results  Component Value Date   CHOL 139 09/06/2022   HDL 41 09/06/2022   LDLCALC 80 09/06/2022    LDLDIRECT 100 10/08/2015   TRIG 95 09/06/2022   CHOLHDL 3.8 11/06/2021   Lab Results  Component Value Date   VD25OH 33.8 09/06/2022   VD25OH 22.9 (L) 04/11/2022   VD25OH 23.5 (L) 10/11/2021   Lab Results  Component Value Date   WBC 4.9 04/11/2022   HGB 11.8 04/11/2022   HCT 32.9 (L) 04/11/2022   MCV 93 04/11/2022   PLT 414 04/11/2022   Lab Results  Component Value Date   IRON 38 (L) 10/08/2015   TIBC 416 10/08/2015   FERRITIN 27 10/08/2015   Attestation Statements:   Reviewed by clinician on day of visit: allergies, medications, problem list, medical history, surgical history, family history, social history, and previous encounter notes.  I have personally spent 44 minutes total time today in preparation, patient care, and documentation for this visit, including the following: review of clinical lab tests; review of medical tests/procedures/services.   I, Burt Knack, am acting as transcriptionist for Quillian Quince, MD.  I have reviewed the above documentation for accuracy and completeness, and I agree with the above. -  Quillian Quince, MD

## 2022-11-21 NOTE — Patient Instructions (Signed)
Description   Today take 2 tablets of warfarin then continue taking Warfarin 1.5 tablets daily EXCEPT 1 tablet on Sundays. [facility] [phone] Recheck INR 3 weeks.  [facility] Fax # [phone] [facility] [phone]

## 2022-12-04 ENCOUNTER — Other Ambulatory Visit (HOSPITAL_COMMUNITY): Payer: Self-pay

## 2022-12-12 ENCOUNTER — Ambulatory Visit: Payer: Medicaid Other | Attending: Cardiovascular Disease | Admitting: *Deleted

## 2022-12-12 DIAGNOSIS — I824Y9 Acute embolism and thrombosis of unspecified deep veins of unspecified proximal lower extremity: Secondary | ICD-10-CM | POA: Diagnosis not present

## 2022-12-12 DIAGNOSIS — I4891 Unspecified atrial fibrillation: Secondary | ICD-10-CM | POA: Diagnosis not present

## 2022-12-12 DIAGNOSIS — Z7901 Long term (current) use of anticoagulants: Secondary | ICD-10-CM

## 2022-12-12 LAB — POCT INR: INR: 1.7 — AB (ref 2.0–3.0)

## 2022-12-12 NOTE — Patient Instructions (Signed)
Description   Today take 2 tablets of warfarin then START taking Warfarin 1.5 tablets daily. [facility] [phone] Recheck INR 3 weeks.  [facility] Fax # [phone] [facility] [phone]

## 2022-12-13 NOTE — Telephone Encounter (Signed)
error 

## 2022-12-17 ENCOUNTER — Encounter: Payer: Medicaid Other | Admitting: Podiatry

## 2022-12-18 ENCOUNTER — Ambulatory Visit (INDEPENDENT_AMBULATORY_CARE_PROVIDER_SITE_OTHER): Payer: Medicaid Other | Admitting: Family Medicine

## 2022-12-19 NOTE — Progress Notes (Signed)
No show for appointment on 12/17/22.

## 2022-12-20 ENCOUNTER — Telehealth: Payer: Self-pay | Admitting: Podiatry

## 2022-12-20 ENCOUNTER — Ambulatory Visit (INDEPENDENT_AMBULATORY_CARE_PROVIDER_SITE_OTHER): Payer: Medicaid Other | Admitting: Family Medicine

## 2022-12-20 ENCOUNTER — Encounter (INDEPENDENT_AMBULATORY_CARE_PROVIDER_SITE_OTHER): Payer: Self-pay | Admitting: Family Medicine

## 2022-12-20 ENCOUNTER — Other Ambulatory Visit: Payer: Self-pay

## 2022-12-20 DIAGNOSIS — E538 Deficiency of other specified B group vitamins: Secondary | ICD-10-CM

## 2022-12-20 DIAGNOSIS — E1169 Type 2 diabetes mellitus with other specified complication: Secondary | ICD-10-CM | POA: Diagnosis not present

## 2022-12-20 DIAGNOSIS — F5089 Other specified eating disorder: Secondary | ICD-10-CM

## 2022-12-20 DIAGNOSIS — E669 Obesity, unspecified: Secondary | ICD-10-CM

## 2022-12-20 DIAGNOSIS — F3289 Other specified depressive episodes: Secondary | ICD-10-CM

## 2022-12-20 DIAGNOSIS — M25512 Pain in left shoulder: Secondary | ICD-10-CM

## 2022-12-20 DIAGNOSIS — M25562 Pain in left knee: Secondary | ICD-10-CM | POA: Diagnosis not present

## 2022-12-20 DIAGNOSIS — Z6841 Body Mass Index (BMI) 40.0 and over, adult: Secondary | ICD-10-CM

## 2022-12-20 DIAGNOSIS — Z7984 Long term (current) use of oral hypoglycemic drugs: Secondary | ICD-10-CM

## 2022-12-20 MED ORDER — BUPROPION HCL ER (SR) 150 MG PO TB12
150.0000 mg | ORAL_TABLET | Freq: Every day | ORAL | 0 refills | Status: DC
Start: 2022-12-20 — End: 2023-01-23
  Filled 2022-12-20: qty 30, 30d supply, fill #0

## 2022-12-20 MED ORDER — GLUCOSE BLOOD VI STRP
ORAL_STRIP | 0 refills | Status: AC
Start: 2022-12-20 — End: ?
  Filled 2022-12-20: qty 100, 33d supply, fill #0

## 2022-12-20 MED ORDER — CYANOCOBALAMIN 500 MCG PO TABS
ORAL_TABLET | ORAL | Status: DC
Start: 2022-12-20 — End: 2023-01-23

## 2022-12-20 NOTE — Telephone Encounter (Signed)
Pt left message 9/21 at 517pm to [facility] appt on 9/23 as she cannot get off of work

## 2022-12-20 NOTE — Progress Notes (Signed)
.smr  Office: [phone]  /  Fax: [phone]  WEIGHT SUMMARY AND BIOMETRICS  Anthropometric Measurements Height: 5\' 4"  (1.626 m) Weight: (!) 373 lb (169.2 kg) BMI (Calculated): 63.99 Weight Lost Since Last Visit: 4 lb   Body Composition  Body Fat %: 69.7 % Fat Mass (lbs): 260.2 lbs Muscle Mass (lbs): 107.2 lbs Total Body Water (lbs): 0 lbs Visceral Fat Rating : 32   Other Clinical Data Fasting: no Labs: no Today's Visit #: 34 Starting Date: 06/22/20    Chief Complaint: OBESITY   History of Present Illness   The patient, with a history of obesity, vitamin B12 deficiency, depression, and diabetes, presents for a follow-up visit. She reports a weight loss of 4 pounds over the past month, attributing this to adherence to a category 3 diet plan with journaling for dinner, which she follows approximately 85% of the time. The dinner meal consists of 400 to 600 calories and 40 or more grams of protein. She also engages in walking for exercise about 15 minutes three times per week.  The patient also reports a low B12 level at 113 and requests a refill for her B12 prescription. She is currently on Wellbutrin for depression and emotional eating behaviors, and she requests a refill for this medication as well. She reports no noticeable side effects from the Wellbutrin.  The patient also has a history of diabetes, which she is managing with diet, exercise, Rybelsus and metformin. She reports no issues with her blood sugar control, with her last A1c being well-controlled at 5.7.  The patient also reports left knee and left shoulder pain, which has been ongoing for about two weeks. She has been managing the pain with stretching exercises, which she reports provides some relief. She has not had any injections in her knee and has not consulted with a primary care provider or a specialist about her knee or shoulder pain. She expresses interest in water aerobics and physical therapy as  potential treatment options.          PHYSICAL EXAM:  Blood pressure 112/75, pulse 80, temperature 99 F (37.2 C), height 5\' 4"  (1.626 m), weight (!) 373 lb (169.2 kg), SpO2 97%. Body mass index is 64.03 kg/m.  DIAGNOSTIC DATA REVIEWED:  BMET    Component Value Date/Time   NA 139 09/06/2022 1018   K 4.1 09/06/2022 1018   CL 103 09/06/2022 1018   CO2 24 09/06/2022 1018   GLUCOSE 92 09/06/2022 1018   GLUCOSE 102 (H) 06/29/2017 0515   BUN 11 09/06/2022 1018   CREATININE 0.75 09/06/2022 1018   CREATININE 0.84 05/24/2017 1117   CREATININE 0.90 01/28/2016 1626   CALCIUM 8.4 (L) 09/06/2022 1018   GFRNONAA 80 03/02/2020 1502   GFRNONAA >60 05/24/2017 1117   GFRNONAA 81 01/28/2016 1626   GFRAA 92 03/02/2020 1502   GFRAA >60 05/24/2017 1117   GFRAA >89 01/28/2016 1626   Lab Results  Component Value Date   HGBA1C 5.7 (H) 09/06/2022   HGBA1C 6.4 08/25/2014   Lab Results  Component Value Date   INSULIN 20.2 09/06/2022   INSULIN 20.3 06/22/2020   Lab Results  Component Value Date   TSH 1.490 04/11/2022   CBC    Component Value Date/Time   WBC 4.9 04/11/2022 1043   WBC 7.3 06/27/2017 0519   RBC 3.55 (L) 04/11/2022 1043   RBC 5.21 (H) 06/27/2017 0519   HGB 11.8 04/11/2022 1043   HCT 32.9 (L) 04/11/2022 1043   PLT 414  04/11/2022 1043   MCV 93 04/11/2022 1043   MCH 33.2 (H) 04/11/2022 1043   MCH 25.9 (L) 06/27/2017 0519   MCHC 35.9 (H) 04/11/2022 1043   MCHC 28.4 (L) 06/27/2017 0519   RDW 14.2 04/11/2022 1043   Iron Studies    Component Value Date/Time   IRON 38 (L) 10/08/2015 1505   TIBC 416 10/08/2015 1505   FERRITIN 27 10/08/2015 1505   IRONPCTSAT 9 (L) 10/08/2015 1505   Lipid Panel     Component Value Date/Time   CHOL 139 09/06/2022 1018   TRIG 95 09/06/2022 1018   HDL 41 09/06/2022 1018   CHOLHDL 3.8 11/06/2021 1055   CHOLHDL 4.6 01/28/2016 1626   VLDL 17 01/28/2016 1626   LDLCALC 80 09/06/2022 1018   LDLDIRECT 100 10/08/2015 1505   Hepatic  Function Panel     Component Value Date/Time   PROT 6.7 09/06/2022 1018   ALBUMIN 3.9 09/06/2022 1018   AST 19 09/06/2022 1018   AST 24 05/24/2017 1117   ALT 16 09/06/2022 1018   ALT 21 05/24/2017 1117   ALKPHOS 46 09/06/2022 1018   BILITOT 0.4 09/06/2022 1018   BILITOT 0.5 05/24/2017 1117      Component Value Date/Time   TSH 1.490 04/11/2022 1043   Nutritional Lab Results  Component Value Date   VD25OH 33.8 09/06/2022   VD25OH 22.9 (L) 04/11/2022   VD25OH 23.5 (L) 10/11/2021     Assessment and Plan    Obesity 4 pound weight loss in the past month. Adherence to category 3 plan with journaling for dinner 85% of the time. Walking for exercise 15 minutes 3 times per week. -Continue current diet and exercise regimen.  Vitamin B12 deficiency Last B12 level was low at 113. -Refill B12 prescription.  Depression with emotional eating behaviors Currently on Wellbutrin. -Refill Wellbutrin prescription.  Type 2 Diabetes Mellitus Controlled with diet, exercise, Metformin, and Rybelsus. -Continue current regimen. -Refill Metformin and Rybelsus prescriptions. -Order new test strips for blood glucose monitoring.  Left knee and shoulder pain Pain management with stretching exercises. No prior injections or specialist consultation. -Refer to Dr. Clementeen Graham for further evaluation and management.  Follow-up in 4 weeks.        She was informed of the importance of frequent follow up visits to maximize her success with intensive lifestyle modifications for her multiple health conditions.    Quillian Quince, MD

## 2022-12-21 ENCOUNTER — Other Ambulatory Visit: Payer: Self-pay

## 2022-12-21 ENCOUNTER — Other Ambulatory Visit (HOSPITAL_COMMUNITY): Payer: Self-pay

## 2022-12-31 ENCOUNTER — Other Ambulatory Visit (INDEPENDENT_AMBULATORY_CARE_PROVIDER_SITE_OTHER): Payer: Self-pay | Admitting: Family Medicine

## 2022-12-31 ENCOUNTER — Other Ambulatory Visit: Payer: Self-pay

## 2022-12-31 ENCOUNTER — Other Ambulatory Visit (HOSPITAL_COMMUNITY): Payer: Self-pay

## 2022-12-31 DIAGNOSIS — E1169 Type 2 diabetes mellitus with other specified complication: Secondary | ICD-10-CM

## 2023-01-01 ENCOUNTER — Other Ambulatory Visit (INDEPENDENT_AMBULATORY_CARE_PROVIDER_SITE_OTHER): Payer: Self-pay | Admitting: Family Medicine

## 2023-01-01 ENCOUNTER — Other Ambulatory Visit (HOSPITAL_COMMUNITY): Payer: Self-pay

## 2023-01-01 ENCOUNTER — Encounter (HOSPITAL_COMMUNITY): Payer: Self-pay

## 2023-01-01 DIAGNOSIS — E1169 Type 2 diabetes mellitus with other specified complication: Secondary | ICD-10-CM

## 2023-01-02 ENCOUNTER — Ambulatory Visit: Payer: Medicaid Other | Attending: Internal Medicine | Admitting: *Deleted

## 2023-01-02 DIAGNOSIS — I4891 Unspecified atrial fibrillation: Secondary | ICD-10-CM | POA: Diagnosis not present

## 2023-01-02 DIAGNOSIS — Z7901 Long term (current) use of anticoagulants: Secondary | ICD-10-CM

## 2023-01-02 DIAGNOSIS — I824Y9 Acute embolism and thrombosis of unspecified deep veins of unspecified proximal lower extremity: Secondary | ICD-10-CM | POA: Diagnosis not present

## 2023-01-02 LAB — POCT INR: INR: 1.9 — AB (ref 2.0–3.0)

## 2023-01-02 NOTE — Patient Instructions (Signed)
Description   Today take 2 tablets of warfarin then START taking Warfarin 1.5 tablets daily except 2 tablets on Sundays. [facility] [phone] Recheck INR 4 weeks.  [facility] Fax # [phone] [facility] [phone]

## 2023-01-12 ENCOUNTER — Other Ambulatory Visit: Payer: Self-pay | Admitting: Cardiovascular Disease

## 2023-01-12 ENCOUNTER — Other Ambulatory Visit (INDEPENDENT_AMBULATORY_CARE_PROVIDER_SITE_OTHER): Payer: Self-pay | Admitting: Family Medicine

## 2023-01-12 ENCOUNTER — Other Ambulatory Visit (HOSPITAL_COMMUNITY): Payer: Self-pay

## 2023-01-12 DIAGNOSIS — F3289 Other specified depressive episodes: Secondary | ICD-10-CM

## 2023-01-12 DIAGNOSIS — E1169 Type 2 diabetes mellitus with other specified complication: Secondary | ICD-10-CM

## 2023-01-12 DIAGNOSIS — Z7901 Long term (current) use of anticoagulants: Secondary | ICD-10-CM

## 2023-01-12 DIAGNOSIS — I2699 Other pulmonary embolism without acute cor pulmonale: Secondary | ICD-10-CM

## 2023-01-12 DIAGNOSIS — E559 Vitamin D deficiency, unspecified: Secondary | ICD-10-CM

## 2023-01-12 DIAGNOSIS — E538 Deficiency of other specified B group vitamins: Secondary | ICD-10-CM

## 2023-01-14 ENCOUNTER — Other Ambulatory Visit: Payer: Self-pay

## 2023-01-14 ENCOUNTER — Other Ambulatory Visit (HOSPITAL_COMMUNITY): Payer: Self-pay

## 2023-01-14 MED ORDER — WARFARIN SODIUM 7.5 MG PO TABS
ORAL_TABLET | ORAL | 0 refills | Status: DC
Start: 2023-01-14 — End: 2023-03-29
  Filled 2023-01-14: qty 100, 67d supply, fill #0

## 2023-01-17 ENCOUNTER — Ambulatory Visit (INDEPENDENT_AMBULATORY_CARE_PROVIDER_SITE_OTHER): Payer: Medicaid Other | Admitting: Family Medicine

## 2023-01-23 ENCOUNTER — Encounter (INDEPENDENT_AMBULATORY_CARE_PROVIDER_SITE_OTHER): Payer: Self-pay | Admitting: Family Medicine

## 2023-01-23 ENCOUNTER — Ambulatory Visit (INDEPENDENT_AMBULATORY_CARE_PROVIDER_SITE_OTHER): Payer: Medicaid Other | Admitting: Family Medicine

## 2023-01-23 ENCOUNTER — Other Ambulatory Visit: Payer: Self-pay

## 2023-01-23 ENCOUNTER — Other Ambulatory Visit (HOSPITAL_COMMUNITY): Payer: Self-pay

## 2023-01-23 VITALS — BP 104/68 | HR 93 | Temp 98.5°F | Ht 64.0 in | Wt 379.0 lb

## 2023-01-23 DIAGNOSIS — Z6841 Body Mass Index (BMI) 40.0 and over, adult: Secondary | ICD-10-CM

## 2023-01-23 DIAGNOSIS — E669 Obesity, unspecified: Secondary | ICD-10-CM

## 2023-01-23 DIAGNOSIS — F5089 Other specified eating disorder: Secondary | ICD-10-CM

## 2023-01-23 DIAGNOSIS — E538 Deficiency of other specified B group vitamins: Secondary | ICD-10-CM | POA: Diagnosis not present

## 2023-01-23 DIAGNOSIS — Z7984 Long term (current) use of oral hypoglycemic drugs: Secondary | ICD-10-CM

## 2023-01-23 DIAGNOSIS — E559 Vitamin D deficiency, unspecified: Secondary | ICD-10-CM | POA: Diagnosis not present

## 2023-01-23 DIAGNOSIS — E1169 Type 2 diabetes mellitus with other specified complication: Secondary | ICD-10-CM

## 2023-01-23 DIAGNOSIS — F3289 Other specified depressive episodes: Secondary | ICD-10-CM

## 2023-01-23 MED ORDER — VITAMIN D (ERGOCALCIFEROL) 1.25 MG (50000 UNIT) PO CAPS
50000.0000 [IU] | ORAL_CAPSULE | ORAL | 0 refills | Status: DC
  Filled 2023-01-23: qty 4, 28d supply, fill #0

## 2023-01-23 MED ORDER — METFORMIN HCL 500 MG PO TABS
500.0000 mg | ORAL_TABLET | Freq: Two times a day (BID) | ORAL | 0 refills | Status: DC
  Filled 2023-01-23: qty 60, 30d supply, fill #0

## 2023-01-23 MED ORDER — CYANOCOBALAMIN 500 MCG PO TABS: ORAL_TABLET | ORAL | Status: DC

## 2023-01-23 MED ORDER — BUPROPION HCL ER (SR) 150 MG PO TB12
150.0000 mg | ORAL_TABLET | Freq: Every day | ORAL | 0 refills | Status: DC
  Filled 2023-01-23: qty 30, 30d supply, fill #0

## 2023-01-23 MED ORDER — RYBELSUS 7 MG PO TABS
1.0000 | ORAL_TABLET | Freq: Every day | ORAL | 0 refills | Status: DC
  Filled 2023-01-23: qty 90, 90d supply, fill #0

## 2023-01-23 MED ORDER — BUPROPION HCL ER (SR) 150 MG PO TB12
150.0000 mg | ORAL_TABLET | Freq: Every day | ORAL | 0 refills | Status: DC
  Filled 2023-01-23 – 2023-02-07 (×3): qty 90, 90d supply, fill #0

## 2023-01-23 MED ORDER — VITAMIN D (ERGOCALCIFEROL) 1.25 MG (50000 UNIT) PO CAPS
50000.0000 [IU] | ORAL_CAPSULE | ORAL | 0 refills | Status: DC
  Filled 2023-01-23 – 2023-02-05 (×2): qty 12, 84d supply, fill #0
  Filled 2023-02-07: qty 13, 90d supply, fill #0
  Filled 2023-02-07: qty 12, 84d supply, fill #0
  Filled 2023-02-14: qty 13, 90d supply, fill #0

## 2023-01-23 NOTE — Progress Notes (Signed)
Chief Complaint:   OBESITY Alexa [name] is here to discuss her progress with her obesity treatment plan along with follow-up of her obesity related diagnoses. Alexa [name] is on the Category 3 Plan and states she is following her eating plan approximately 85-90% of the time. Alexa [name] states she is walking for 15 minutes 3 times per week.  Today's visit was #: 34 Starting weight: 434 lbs Starting date: 06/22/2020 Today's weight: 379 lbs Today's date: 01/23/2023 Total lbs lost to date: 55 Total lbs lost since last in-office visit: 0  Interim History: Patient has had increased stress and she is not able to concentrate on following a structured eating plan.   Subjective:   1. Type 2 diabetes mellitus with other specified complication, unspecified whether long term insulin use (HCC) Patient is working on her diet and low dose Rybelsus with no GI upset. She has been out of metformin for a few days.   2. Vitamin D deficiency Patient is on Vitamin, with no change in fatigue. She requests a refill.   3. B12 deficiency Patient is stable on B12 supplementations, and she is trying to increase B12 in her diet as well.   4. Emotional Eating Behavior Patient notes increased stress and increased stress eating. She is working on this though. No side effects were noted on Wellbutrin.   Assessment/Plan:   1. Type 2 diabetes mellitus with other specified complication, unspecified whether long term insulin use (HCC) Patient will continue with her diet and exercise. Patient agreed to increase Rybelsus to 7 mg once daily with a 90 day supply. We will refill metformin for 1 month.   - metFORMIN (GLUCOPHAGE) 500 MG tablet; Take 1 tablet (500 mg total) by mouth 2 (two) times daily with a meal.  Dispense: 60 tablet; Refill: 0 - Semaglutide (RYBELSUS) 7 MG TABS; Take 1 tablet (7 mg total) by mouth daily.  Dispense: 90 tablet; Refill: 0  2. Vitamin D deficiency We will refill prescription Vitamin D 50,000 IU once  weekly for 90 days.  - Vitamin D, Ergocalciferol, (DRISDOL) 1.25 MG (50000 UNIT) CAPS capsule; Take 1 capsule (50,000 Units total) by mouth every 7 (seven) days.  Dispense: 13 capsule; Refill: 0  3. B12 deficiency Patient will continue B12 supplementation 500 mcg, and we will refill for 90 days.  - cyanocobalamin (VITAMIN B12) 500 MCG tablet; [phone] mcg once daily  4. Emotional Eating Behavior Patient will continue Wellbutrin SR 150 mg daily, and we will refill for 90 days - buPROPion (WELLBUTRIN SR) 150 MG 12 hr tablet; Take 1 tablet (150 mg total) by mouth daily.  Dispense: 90 tablet; Refill: 0  5. BMI 60.0-69.9, adult (HCC)  6. Obesity, Beginning BMI 74.50 Alexa [name] is currently in the action stage of change. As such, her goal is to maintain weight for now. She has agreed to the Category 3 Plan and practicing portion control and making smarter food choices, such as increasing vegetables and decreasing simple carbohydrates.   Patient's goal is to maintain her weight until she is able to start back to a structured eating plan.   Exercise goals: As is.   Behavioral modification strategies: increasing lean protein intake and meal planning and cooking strategies.  Alexa [name] has agreed to follow-up with our clinic in 6 weeks. She was informed of the importance of frequent follow-up visits to maximize her success with intensive lifestyle modifications for her multiple health conditions.   Objective:   Blood pressure 104/68, pulse 93, temperature 98.5 F (36.9  C), height 5\' 4"  (1.626 m), weight (!) 379 lb (171.9 kg), SpO2 96%. Body mass index is 65.06 kg/m.  Lab Results  Component Value Date   CREATININE 0.75 09/06/2022   BUN 11 09/06/2022   NA 139 09/06/2022   K 4.1 09/06/2022   CL 103 09/06/2022   CO2 24 09/06/2022   Lab Results  Component Value Date   ALT 16 09/06/2022   AST 19 09/06/2022   ALKPHOS 46 09/06/2022   BILITOT 0.4 09/06/2022   Lab Results  Component Value Date    HGBA1C 5.7 (H) 09/06/2022   HGBA1C 5.6 04/11/2022   HGBA1C 5.8 (H) 10/11/2021   HGBA1C 5.6 02/08/2021   HGBA1C 6.2 (H) 11/08/2020   Lab Results  Component Value Date   INSULIN 20.2 09/06/2022   INSULIN 16.0 04/11/2022   INSULIN 17.2 10/11/2021   INSULIN 17.6 02/08/2021   INSULIN 3.9 11/08/2020   Lab Results  Component Value Date   TSH 1.490 04/11/2022   Lab Results  Component Value Date   CHOL 139 09/06/2022   HDL 41 09/06/2022   LDLCALC 80 09/06/2022   LDLDIRECT 100 10/08/2015   TRIG 95 09/06/2022   CHOLHDL 3.8 11/06/2021   Lab Results  Component Value Date   VD25OH 33.8 09/06/2022   VD25OH 22.9 (L) 04/11/2022   VD25OH 23.5 (L) 10/11/2021   Lab Results  Component Value Date   WBC 4.9 04/11/2022   HGB 11.8 04/11/2022   HCT 32.9 (L) 04/11/2022   MCV 93 04/11/2022   PLT 414 04/11/2022   Lab Results  Component Value Date   IRON 38 (L) 10/08/2015   TIBC 416 10/08/2015   FERRITIN 27 10/08/2015   Attestation Statements:   Reviewed by clinician on day of visit: allergies, medications, problem list, medical history, surgical history, family history, social history, and previous encounter notes.   I, Burt Knack, am acting as transcriptionist for Quillian Quince, MD.  I have reviewed the above documentation for accuracy and completeness, and I agree with the above. -  Quillian Quince, MD

## 2023-01-24 ENCOUNTER — Encounter: Payer: Self-pay | Admitting: Pharmacist

## 2023-01-24 ENCOUNTER — Other Ambulatory Visit: Payer: Self-pay

## 2023-01-29 ENCOUNTER — Other Ambulatory Visit: Payer: Self-pay

## 2023-01-30 ENCOUNTER — Ambulatory Visit: Payer: Medicaid Other | Attending: Internal Medicine

## 2023-01-30 DIAGNOSIS — Z5181 Encounter for therapeutic drug level monitoring: Secondary | ICD-10-CM | POA: Diagnosis not present

## 2023-01-30 DIAGNOSIS — Z7901 Long term (current) use of anticoagulants: Secondary | ICD-10-CM

## 2023-01-30 LAB — POCT INR: INR: 1.9 — AB (ref 2.0–3.0)

## 2023-01-30 NOTE — Patient Instructions (Signed)
Description   Today take 2 tablets of warfarin then START taking Warfarin 1.5 tablets daily except 2 tablets on Sundays. [facility] [phone] Recheck INR 4 weeks.  [facility] Fax # [phone] [facility] [phone]

## 2023-02-05 ENCOUNTER — Other Ambulatory Visit (HOSPITAL_COMMUNITY): Payer: Self-pay

## 2023-02-05 ENCOUNTER — Other Ambulatory Visit: Payer: Self-pay

## 2023-02-05 ENCOUNTER — Other Ambulatory Visit (INDEPENDENT_AMBULATORY_CARE_PROVIDER_SITE_OTHER): Payer: Self-pay | Admitting: Family Medicine

## 2023-02-05 DIAGNOSIS — E538 Deficiency of other specified B group vitamins: Secondary | ICD-10-CM

## 2023-02-06 ENCOUNTER — Other Ambulatory Visit: Payer: Self-pay

## 2023-02-06 ENCOUNTER — Other Ambulatory Visit (HOSPITAL_COMMUNITY): Payer: Self-pay

## 2023-02-06 ENCOUNTER — Encounter: Payer: Self-pay | Admitting: Pharmacist

## 2023-02-07 ENCOUNTER — Other Ambulatory Visit (HOSPITAL_COMMUNITY): Payer: Self-pay

## 2023-02-07 ENCOUNTER — Other Ambulatory Visit: Payer: Self-pay

## 2023-02-08 ENCOUNTER — Other Ambulatory Visit: Payer: Self-pay

## 2023-02-08 ENCOUNTER — Encounter: Payer: Self-pay | Admitting: Pharmacist

## 2023-02-13 ENCOUNTER — Other Ambulatory Visit: Payer: Self-pay

## 2023-02-14 ENCOUNTER — Other Ambulatory Visit (HOSPITAL_COMMUNITY): Payer: Self-pay

## 2023-02-27 ENCOUNTER — Ambulatory Visit: Payer: Medicaid Other | Attending: Cardiovascular Disease | Admitting: *Deleted

## 2023-02-27 DIAGNOSIS — I4891 Unspecified atrial fibrillation: Secondary | ICD-10-CM | POA: Diagnosis not present

## 2023-02-27 DIAGNOSIS — Z7901 Long term (current) use of anticoagulants: Secondary | ICD-10-CM

## 2023-02-27 DIAGNOSIS — I824Y9 Acute embolism and thrombosis of unspecified deep veins of unspecified proximal lower extremity: Secondary | ICD-10-CM

## 2023-02-27 DIAGNOSIS — Z5181 Encounter for therapeutic drug level monitoring: Secondary | ICD-10-CM | POA: Diagnosis not present

## 2023-02-27 LAB — POCT INR: INR: 2.6 (ref 2.0–3.0)

## 2023-02-27 NOTE — Patient Instructions (Addendum)
Description   Continue taking Warfarin 1.5 tablets daily except 2 tablets on Sundays. Recheck INR 5 weeks.  [facility] Fax # [phone] [facility] [phone]

## 2023-03-07 ENCOUNTER — Other Ambulatory Visit (HOSPITAL_COMMUNITY): Payer: Self-pay

## 2023-03-07 ENCOUNTER — Ambulatory Visit (INDEPENDENT_AMBULATORY_CARE_PROVIDER_SITE_OTHER): Payer: Medicaid Other | Admitting: Family Medicine

## 2023-03-07 ENCOUNTER — Other Ambulatory Visit: Payer: Self-pay

## 2023-03-07 ENCOUNTER — Encounter (INDEPENDENT_AMBULATORY_CARE_PROVIDER_SITE_OTHER): Payer: Self-pay | Admitting: Family Medicine

## 2023-03-07 DIAGNOSIS — F5089 Other specified eating disorder: Secondary | ICD-10-CM

## 2023-03-07 DIAGNOSIS — Z6841 Body Mass Index (BMI) 40.0 and over, adult: Secondary | ICD-10-CM

## 2023-03-07 DIAGNOSIS — E538 Deficiency of other specified B group vitamins: Secondary | ICD-10-CM | POA: Diagnosis not present

## 2023-03-07 DIAGNOSIS — F3289 Other specified depressive episodes: Secondary | ICD-10-CM

## 2023-03-07 DIAGNOSIS — E559 Vitamin D deficiency, unspecified: Secondary | ICD-10-CM | POA: Diagnosis not present

## 2023-03-07 DIAGNOSIS — E1169 Type 2 diabetes mellitus with other specified complication: Secondary | ICD-10-CM

## 2023-03-07 DIAGNOSIS — Z7984 Long term (current) use of oral hypoglycemic drugs: Secondary | ICD-10-CM

## 2023-03-07 DIAGNOSIS — F32A Depression, unspecified: Secondary | ICD-10-CM | POA: Diagnosis not present

## 2023-03-07 MED ORDER — CYANOCOBALAMIN 500 MCG PO TABS: ORAL_TABLET | ORAL | Status: DC

## 2023-03-07 MED ORDER — METFORMIN HCL 500 MG PO TABS
500.0000 mg | ORAL_TABLET | Freq: Two times a day (BID) | ORAL | 0 refills | Status: DC
  Filled 2023-03-07: qty 60, 30d supply, fill #0

## 2023-03-07 MED ORDER — VITAMIN D (ERGOCALCIFEROL) 1.25 MG (50000 UNIT) PO CAPS
50000.0000 [IU] | ORAL_CAPSULE | ORAL | 0 refills | Status: DC
  Filled 2023-03-07: qty 13, 91d supply, fill #0

## 2023-03-07 MED ORDER — BUPROPION HCL ER (SR) 150 MG PO TB12
150.0000 mg | ORAL_TABLET | Freq: Every day | ORAL | 0 refills | Status: DC
  Filled 2023-03-07: qty 90, 90d supply, fill #0

## 2023-03-07 MED ORDER — RYBELSUS 7 MG PO TABS
1.0000 | ORAL_TABLET | Freq: Every day | ORAL | 0 refills | Status: DC
  Filled 2023-03-07: qty 90, 90d supply, fill #0

## 2023-03-07 NOTE — Progress Notes (Signed)
.smr  Office: [phone]  /  Fax: [phone]  WEIGHT SUMMARY AND BIOMETRICS  Anthropometric Measurements Height: 5\' 4"  (1.626 m) Weight: (!) 381 lb (172.8 kg) BMI (Calculated): 65.37 Weight at Last Visit: 379 lb Weight Lost Since Last Visit: 0 Weight Gained Since Last Visit: 3 lb Starting Weight: 434 lb Total Weight Loss (lbs): 49 lb (22.2 kg)   Body Composition  Body Fat %: 70.9 % Fat Mass (lbs): 270.6 lbs Muscle Mass (lbs): 105.4 lbs Visceral Fat Rating : 33   Other Clinical Data Fasting: No Labs: No Today's Visit #: 36 Starting Date: 06/22/20    Chief Complaint: OBESITY    History of Present Illness   The patient, with a history of type 2 diabetes, vitamin D deficiency, B12 deficiency, and depression with emotional eating behaviors, presents for a routine follow-up. She is currently on Bupropion SR 150 mg daily, Metformin 500 mg BID, prescription Vitamin D 50,000 international units weekly, Rybelsus 7 mg daily, and B12 1000 mcg daily. She reports no issues with these medications and requests refills.  Despite efforts to follow a category three eating plan and engage in regular physical activity, including walking and stretching, the patient has gained three pounds over the past six weeks, which included the Thanksgiving holiday. She estimates adherence to the eating plan at approximately 70% of the time. She reports good portion control and protein intake over the holiday, with limited consumption of sweets.  Her most recent hemoglobin A1c was well controlled at 5.7, but her vitamin D and B12 levels have not yet reached the goal. The patient also reports engaging in indoor walking and stretching exercises 2-3 times per week due to concerns about loose dogs in her neighborhood. She also engages in knitting and coloring as strategies to prevent nighttime snacking.          PHYSICAL EXAM:  Blood pressure 128/81, pulse 76, temperature 98.2 F (36.8 C), height 5\' 4"   (1.626 m), weight (!) 381 lb (172.8 kg), SpO2 98%. Body mass index is 65.4 kg/m.  DIAGNOSTIC DATA REVIEWED:  BMET    Component Value Date/Time   NA 139 09/06/2022 1018   K 4.1 09/06/2022 1018   CL 103 09/06/2022 1018   CO2 24 09/06/2022 1018   GLUCOSE 92 09/06/2022 1018   GLUCOSE 102 (H) 06/29/2017 0515   BUN 11 09/06/2022 1018   CREATININE 0.75 09/06/2022 1018   CREATININE 0.84 05/24/2017 1117   CREATININE 0.90 01/28/2016 1626   CALCIUM 8.4 (L) 09/06/2022 1018   GFRNONAA 80 03/02/2020 1502   GFRNONAA >60 05/24/2017 1117   GFRNONAA 81 01/28/2016 1626   GFRAA 92 03/02/2020 1502   GFRAA >60 05/24/2017 1117   GFRAA >89 01/28/2016 1626   Lab Results  Component Value Date   HGBA1C 5.7 (H) 09/06/2022   HGBA1C 6.4 08/25/2014   Lab Results  Component Value Date   INSULIN 20.2 09/06/2022   INSULIN 20.3 06/22/2020   Lab Results  Component Value Date   TSH 1.490 04/11/2022   CBC    Component Value Date/Time   WBC 4.9 04/11/2022 1043   WBC 7.3 06/27/2017 0519   RBC 3.55 (L) 04/11/2022 1043   RBC 5.21 (H) 06/27/2017 0519   HGB 11.8 04/11/2022 1043   HCT 32.9 (L) 04/11/2022 1043   PLT 414 04/11/2022 1043   MCV 93 04/11/2022 1043   MCH 33.2 (H) 04/11/2022 1043   MCH 25.9 (L) 06/27/2017 0519   MCHC 35.9 (H) 04/11/2022 1043   MCHC  28.4 (L) 06/27/2017 0519   RDW 14.2 04/11/2022 1043   Iron Studies    Component Value Date/Time   IRON 38 (L) 10/08/2015 1505   TIBC 416 10/08/2015 1505   FERRITIN 27 10/08/2015 1505   IRONPCTSAT 9 (L) 10/08/2015 1505   Lipid Panel     Component Value Date/Time   CHOL 139 09/06/2022 1018   TRIG 95 09/06/2022 1018   HDL 41 09/06/2022 1018   CHOLHDL 3.8 11/06/2021 1055   CHOLHDL 4.6 01/28/2016 1626   VLDL 17 01/28/2016 1626   LDLCALC 80 09/06/2022 1018   LDLDIRECT 100 10/08/2015 1505   Hepatic Function Panel     Component Value Date/Time   PROT 6.7 09/06/2022 1018   ALBUMIN 3.9 09/06/2022 1018   AST 19 09/06/2022 1018   AST  24 05/24/2017 1117   ALT 16 09/06/2022 1018   ALT 21 05/24/2017 1117   ALKPHOS 46 09/06/2022 1018   BILITOT 0.4 09/06/2022 1018   BILITOT 0.5 05/24/2017 1117      Component Value Date/Time   TSH 1.490 04/11/2022 1043   Nutritional Lab Results  Component Value Date   VD25OH 33.8 09/06/2022   VD25OH 22.9 (L) 04/11/2022   VD25OH 23.5 (L) 10/11/2021     Assessment and Plan    Type 2 Diabetes Mellitus Well-controlled with recent hemoglobin A1c of 5.7. On metformin 500 mg BID and Rybelsus 7 mg daily. No issues with current medications. Gained three pounds in the last six weeks. Following category three eating plan 70% of the time and engaging in walking and stretching exercises. - Refill metformin 500 mg BID - Refill Rybelsus 7 mg daily - Fasting labs at next visit  Depression with Emotional Eating On Bupropion SR 150 mg in the morning. No issues with current medication. Gained three pounds in the last six weeks. Following category three eating plan 70% of the time and engaging in walking and stretching exercises. - Refill Bupropion SR 150 mg in the morning - Encourage continuation of walking and stretching exercises - Follow-up visit in four weeks  Vitamin D Deficiency Not yet at goal based on last labs. On 50,000 IU per week. - Refill vitamin D 50,000 IU per week - Fasting labs at next visit  Vitamin B12 Deficiency Not yet at goal based on last labs. On B12 1000 mcg daily. - Refill B12 1000 mcg daily - Fasting labs at next visit  General Health Maintenance Engaging in physical activity and managing diet. Discussed the importance of continuing these lifestyle modifications. - Encourage continuation of walking and stretching exercises - Fasting labs at next visit  Follow-up - Schedule follow-up visit in four weeks - Fasting labs at next visit.       She was informed of the importance of frequent follow up visits to maximize her success with intensive lifestyle  modifications for her multiple health conditions.    Quillian Quince, MD

## 2023-03-29 ENCOUNTER — Other Ambulatory Visit: Payer: Self-pay

## 2023-03-29 ENCOUNTER — Other Ambulatory Visit: Payer: Self-pay | Admitting: Cardiovascular Disease

## 2023-03-29 DIAGNOSIS — I2699 Other pulmonary embolism without acute cor pulmonale: Secondary | ICD-10-CM

## 2023-03-29 DIAGNOSIS — Z7901 Long term (current) use of anticoagulants: Secondary | ICD-10-CM

## 2023-03-29 MED ORDER — WARFARIN SODIUM 7.5 MG PO TABS
ORAL_TABLET | ORAL | 0 refills | Status: DC
  Filled 2023-03-29: qty 100, 66d supply, fill #0

## 2023-04-03 ENCOUNTER — Ambulatory Visit: Payer: Medicaid Other | Attending: Internal Medicine | Admitting: *Deleted

## 2023-04-03 DIAGNOSIS — Z7901 Long term (current) use of anticoagulants: Secondary | ICD-10-CM

## 2023-04-03 DIAGNOSIS — I4891 Unspecified atrial fibrillation: Secondary | ICD-10-CM

## 2023-04-03 DIAGNOSIS — Z5181 Encounter for therapeutic drug level monitoring: Secondary | ICD-10-CM

## 2023-04-03 DIAGNOSIS — I824Y9 Acute embolism and thrombosis of unspecified deep veins of unspecified proximal lower extremity: Secondary | ICD-10-CM

## 2023-04-03 LAB — POCT INR: INR: 2 (ref 2.0–3.0)

## 2023-04-03 NOTE — Patient Instructions (Addendum)
 Description   Today take 2 tablets of warfarin then continue taking Warfarin 1.5 tablets daily except 2 tablets on Sundays. Recheck INR 5 weeks.  [facility] Fax # [phone] [facility] [phone]

## 2023-04-07 NOTE — Progress Notes (Signed)
 SUBJECTIVE:  Chief Complaint: Obesity  Interim History: She Alexa [name] up 3 lbs since her last visit.  Down 46 lbs overall TBW loss of 10.6% Alexa Alexa [name] Alexa [name] a 48 year old individual with a history of type 2 diabetes, vitamin D  and B12 deficiencies, and emotional eating behavior, presents for a follow-up visit regarding her obesity treatment plan. She Alexa [name] been managed on metformin  500mg  twice daily and Rybelsus  7mg  daily for diabetes, along with bupropion  SR 150mg  daily, vitamin D  50,000 international units weekly, and B12 1000mcg daily.  The patient reports a change in bowel habits, with stools becoming more solid and a shift in bowel movement timing to the morning. She also notes an increase in sweet cravings, which she manages by consuming fruits and other healthier options. She Alexa [name] been making efforts to increase physical activity, including walking and participating in church activities.  The patient Alexa [name] previously been on Ozempic , which she found helpful for weight loss and overall metabolic improvement. However, due to insurance issues, she switched to Rybelsus . She expresses a desire to return to Ozempic  if possible.  In addition to her own health, the patient Alexa [name] also caring for her mother, who Alexa [name] significant health problems, including lymphedema. This Alexa [name] been a source of stress and Alexa [name] required a significant amount of her time and energy. Despite these challenges, the patient Alexa [name] managed to lose 46 pounds and Alexa [name] seen an increase in muscle mass.  The patient's medication adherence Alexa [name] good, and she reports no issues with metformin . She Alexa [name] also fasting in preparation for upcoming labs. She Alexa [name] been experiencing regular menstrual cycles, which are not heavy but tend to last a long time. She denies any iron  supplementation.   Fasting labs were obtained at today's visit.  She was informed we would discuss her lab results at her next visit unless there Alexa [name] a critical issue that needs to be  addressed sooner. She agreed to keep her next visit at the agreed upon time to discuss these results.   Alexa Alexa [name] here to discuss her progress with her obesity treatment plan. She Alexa [name] on the Category 3 Plan and states she Alexa [name] following her eating plan approximately 80 % of the time. She states she Alexa [name] exercising walking 15-20 minutes 2 times per week.   OBJECTIVE: Visit Diagnoses: Problem List Items Addressed This Visit     Vitamin D  deficiency   Relevant Orders   VITAMIN D  25 Hydroxy (Vit-D Deficiency, Fractures)   Type 2 diabetes mellitus with other specified complication (HCC) - Primary   Relevant Medications   Semaglutide ,0.25 or 0.5MG /DOS, (OZEMPIC , 0.25 OR 0.5 MG/DOSE,) 2 MG/1.5ML SOPN   metFORMIN  (GLUCOPHAGE ) 500 MG tablet   Other Relevant Orders   CMP14+EGFR   Hemoglobin A1c   Insulin , random   Lipid Panel With LDL/HDL Ratio   B12 deficiency   Relevant Orders   Vitamin B12   CBC with Differential/Platelet   Depression   Obesity, Beginning BMI 74.50   Relevant Medications   Semaglutide ,0.25 or 0.5MG /DOS, (OZEMPIC , 0.25 OR 0.5 MG/DOSE,) 2 MG/1.5ML SOPN   metFORMIN  (GLUCOPHAGE ) 500 MG tablet   Other Visit Diagnoses       Fatigue, unspecified type       Relevant Orders   CBC with Differential/Platelet   VITAMIN D  25 Hydroxy (Vit-D Deficiency, Fractures)   Folate   Iron  and TIBC   Ferritin     Obesity 48 year old with obesity, plateaued after 46-pound weight loss. Prefers Ozempic  over Rybelsus  due to better outcomes. Discussed  starting Ozempic  at 0.25 mg for four weeks, then increasing to 0.5 mg. Informed about potential increase in cravings when switching medications. - Submit prior authorization for Ozempic  - Discontinue Rybelsus  if Ozempic  Alexa [name] approved - Educate on potential increase in cravings - Encourage protein-based snacks and portion control, especially for nuts  Type 2 Diabetes Mellitus Lab Results  Component Value Date   HGBA1C 5.7 (H) 09/06/2022    HGBA1C 5.6 04/11/2022   HGBA1C 5.8 (H) 10/11/2021   Lab Results  Component Value Date   MICROALBUR 1.4 01/28/2016   LDLCALC 80 09/06/2022   CREATININE 0.75 09/06/2022    Managed with metformin  500 mg twice daily and Rybelsus  7 mg daily. Considering switching to Ozempic  for better metabolic control and to encourage further weight loss. A1c up to 6.7 in the past, but improved overall with treatment and weight loss. Discussed benefits of Ozempic  in improving metabolic state and weight loss. - Refill/continue metformin  500 mg twice daily Start Ozempic  if approved by Medicaid.  Discontinue Rybelsus  if Ozempic  approved.  - Check A1c, fasting insulin , and lipid panel and CMET today  Emotional Eating Managed with bupropion  SR 150 mg daily. Reports occasional cravings for sweets related to emotional stress and previous alcohol consumption. Discussed healthy snack alternatives and importance of protein-based snacks. - Continue bupropion  SR 150 mg daily. No refill needed today.  - Encourage healthy snack alternatives like fruits and protein-based snacks  Possible Iron  Deficiency Anemia/fatigue Reports prolonged menstrual cycles and occasional fatigue. No current iron  supplementation. Discussed checking anemia panel and CBC to assess for iron  deficiency. - Check anemia panel and CBC  Vitamin D  Deficiency Managed with Ergocalciferol  50,000 IU weekly.No N/V or muscle weakness with Ergocalciferol .  Low vitamin D  levels can be associated with adiposity and may result in leptin resistance and weight gain. Also associated with fatigue. Currently on vitamin D  supplementation without any adverse effects.   - ReCheck vitamin D  levels today to optimize treatment.   Vitamin B12 Deficiency Managed with 1000 mcg daily. No side effects.  - Check B12 levels to optimize treatment.   General Health Maintenance None - Encourage walking and physical activity - Encourage adequate fluid intake (80-100 ounces  daily)  Follow-up - Schedule follow-up appointment in one month - Review lab results and discuss any abnormalities at the next visit - Call patient if any lab results are significantly abnormal.  Vitals Temp: 98.2 F (36.8 C) BP: 129/79 Pulse Rate: 93 SpO2: 97 %   Anthropometric Measurements Height: 5' 4 (1.626 m) Weight: (!) 384 lb (174.2 kg) BMI (Calculated): 65.88 Weight at Last Visit: 381 lb Weight Lost Since Last Visit: 0 Weight Gained Since Last Visit: 3 lb Starting Weight: 434 lb Total Weight Loss (lbs): 46 lb (20.9 kg)   Body Composition  Body Fat %: 69.2 % Fat Mass (lbs): 265.8 lbs Muscle Mass (lbs): 112.4 lbs Visceral Fat Rating : 32   Other Clinical Data Fasting: yes Labs: yes Today's Visit #: 37 Starting Date: 06/22/20     ASSESSMENT AND PLAN:  Diet: Alexa Alexa [name] Alexa [name] currently in the action stage of change. As such, her goal Alexa [name] to continue with weight loss efforts. She Alexa [name] agreed to Category 3 Plan.  Exercise: Alexa Alexa [name] been instructed to try a geriatric exercise plan and that some exercise Alexa [name] better than none for weight loss and overall health benefits.   Behavior Modification:  We discussed the following Behavioral Modification Strategies today: increasing lean protein intake, decreasing simple carbohydrates, increasing vegetables, increase  H2O intake, increase high fiber foods, no skipping meals, better snacking choices, emotional eating strategies , and planning for success. We discussed various medication options to help Alexa Alexa [name] with her weight loss efforts and we both agreed to switch to Ozempic  if covered by insurance at this point.  Return in about 4 weeks (around 05/06/2023).[name] She was informed of the importance of frequent follow up visits to maximize her success with intensive lifestyle modifications for her multiple health conditions.  Attestation Statements:   Reviewed by clinician on day of visit: allergies, medications, problem list,  medical history, surgical history, family history, social history, and previous encounter notes.   Time spent on visit including pre-visit chart review and post-visit care and charting was 35 minutes.    Chin Wachter, PA-C

## 2023-04-08 ENCOUNTER — Other Ambulatory Visit: Payer: Self-pay

## 2023-04-08 ENCOUNTER — Other Ambulatory Visit (HOSPITAL_COMMUNITY): Payer: Self-pay

## 2023-04-08 ENCOUNTER — Encounter (INDEPENDENT_AMBULATORY_CARE_PROVIDER_SITE_OTHER): Payer: Self-pay | Admitting: Physician Assistant

## 2023-04-08 ENCOUNTER — Ambulatory Visit (INDEPENDENT_AMBULATORY_CARE_PROVIDER_SITE_OTHER): Payer: Medicaid Other | Admitting: Physician Assistant

## 2023-04-08 VITALS — BP 129/79 | HR 93 | Temp 98.2°F | Ht 64.0 in | Wt 384.0 lb

## 2023-04-08 DIAGNOSIS — E538 Deficiency of other specified B group vitamins: Secondary | ICD-10-CM

## 2023-04-08 DIAGNOSIS — R5383 Other fatigue: Secondary | ICD-10-CM

## 2023-04-08 DIAGNOSIS — Z7984 Long term (current) use of oral hypoglycemic drugs: Secondary | ICD-10-CM

## 2023-04-08 DIAGNOSIS — E669 Obesity, unspecified: Secondary | ICD-10-CM

## 2023-04-08 DIAGNOSIS — E559 Vitamin D deficiency, unspecified: Secondary | ICD-10-CM | POA: Diagnosis not present

## 2023-04-08 DIAGNOSIS — Z6841 Body Mass Index (BMI) 40.0 and over, adult: Secondary | ICD-10-CM

## 2023-04-08 DIAGNOSIS — Z7985 Long-term (current) use of injectable non-insulin antidiabetic drugs: Secondary | ICD-10-CM

## 2023-04-08 DIAGNOSIS — E1169 Type 2 diabetes mellitus with other specified complication: Secondary | ICD-10-CM | POA: Diagnosis not present

## 2023-04-08 DIAGNOSIS — F3289 Other specified depressive episodes: Secondary | ICD-10-CM

## 2023-04-08 MED ORDER — METFORMIN HCL 500 MG PO TABS
500.0000 mg | ORAL_TABLET | Freq: Two times a day (BID) | ORAL | 0 refills | Status: DC
  Filled 2023-04-08: qty 60, 30d supply, fill #0

## 2023-04-08 MED ORDER — OZEMPIC (0.25 OR 0.5 MG/DOSE) 2 MG/3ML ~~LOC~~ SOPN
0.2500 mg | PEN_INJECTOR | SUBCUTANEOUS | 0 refills | Status: DC
  Filled 2023-04-08: qty 3, 56d supply, fill #0

## 2023-04-09 ENCOUNTER — Other Ambulatory Visit: Payer: Self-pay

## 2023-04-09 LAB — VITAMIN B12: Vitamin B-12: 177 pg/mL — ABNORMAL LOW (ref 232–1245)

## 2023-04-09 LAB — CMP14+EGFR
ALT: 11 [IU]/L (ref 0–32)
AST: 14 [IU]/L (ref 0–40)
Albumin: 4.2 g/dL (ref 3.9–4.9)
Alkaline Phosphatase: 64 [IU]/L (ref 44–121)
BUN/Creatinine Ratio: 12 (ref 9–23)
BUN: 10 mg/dL (ref 6–24)
Bilirubin Total: 0.3 mg/dL (ref 0.0–1.2)
CO2: 24 mmol/L (ref 20–29)
Calcium: 8.8 mg/dL (ref 8.7–10.2)
Chloride: 98 mmol/L (ref 96–106)
Creatinine, Ser: 0.82 mg/dL (ref 0.57–1.00)
Globulin, Total: 3 g/dL (ref 1.5–4.5)
Glucose: 77 mg/dL (ref 70–99)
Potassium: 4.3 mmol/L (ref 3.5–5.2)
Sodium: 137 mmol/L (ref 134–144)
Total Protein: 7.2 g/dL (ref 6.0–8.5)
eGFR: 90 mL/min/{1.73_m2} (ref >59)

## 2023-04-09 LAB — LIPID PANEL WITH LDL/HDL RATIO
Cholesterol, Total: 174 mg/dL (ref 100–199)
HDL: 48 mg/dL (ref >39)
LDL Chol Calc (NIH): 104 mg/dL — ABNORMAL HIGH (ref 0–99)
LDL/HDL Ratio: 2.2 {ratio} (ref 0.0–3.2)
Triglycerides: 124 mg/dL (ref 0–149)
VLDL Cholesterol Cal: 22 mg/dL (ref 5–40)

## 2023-04-09 LAB — CBC WITH DIFFERENTIAL/PLATELET
Basophils Absolute: 0 10*3/uL (ref 0.0–0.2)
Basos: 0 %
EOS (ABSOLUTE): 0 10*3/uL (ref 0.0–0.4)
Eos: 0 %
Hematocrit: 36.2 % (ref 34.0–46.6)
Hemoglobin: 11.8 g/dL (ref 11.1–15.9)
Immature Grans (Abs): 0 10*3/uL (ref 0.0–0.1)
Immature Granulocytes: 0 %
Lymphocytes Absolute: 2 10*3/uL (ref 0.7–3.1)
Lymphs: 33 %
MCH: 29.6 pg (ref 26.6–33.0)
MCHC: 32.6 g/dL (ref 31.5–35.7)
MCV: 91 fL (ref 79–97)
Monocytes Absolute: 0.6 10*3/uL (ref 0.1–0.9)
Monocytes: 9 %
Neutrophils Absolute: 3.5 10*3/uL (ref 1.4–7.0)
Neutrophils: 58 %
Platelets: 424 10*3/uL (ref 150–450)
RBC: 3.98 x10E6/uL (ref 3.77–5.28)
RDW: 15.9 % — ABNORMAL HIGH (ref 11.7–15.4)
WBC: 6.1 10*3/uL (ref 3.4–10.8)

## 2023-04-09 LAB — HEMOGLOBIN A1C
Est. average glucose Bld gHb Est-mCnc: 120 mg/dL
Hgb A1c MFr Bld: 5.8 % — ABNORMAL HIGH (ref 4.8–5.6)

## 2023-04-09 LAB — IRON AND TIBC
Iron Saturation: 13 % — ABNORMAL LOW (ref 15–55)
Iron: 41 ug/dL (ref 27–159)
Total Iron Binding Capacity: 313 ug/dL (ref 250–450)
UIBC: 272 ug/dL (ref 131–425)

## 2023-04-09 LAB — FERRITIN: Ferritin: 59 ng/mL (ref 15–150)

## 2023-04-09 LAB — INSULIN, RANDOM: INSULIN: 17.8 u[IU]/mL (ref 2.6–24.9)

## 2023-04-09 LAB — VITAMIN D 25 HYDROXY (VIT D DEFICIENCY, FRACTURES): Vit D, 25-Hydroxy: 36.1 ng/mL (ref 30.0–100.0)

## 2023-04-09 LAB — FOLATE: Folate: 5.5 ng/mL (ref >3.0)

## 2023-04-25 ENCOUNTER — Encounter: Payer: Self-pay | Admitting: Family Medicine

## 2023-04-25 ENCOUNTER — Ambulatory Visit: Payer: Medicaid Other | Admitting: Family Medicine

## 2023-04-25 VITALS — BP 114/80 | HR 67 | Temp 98.5°F | Resp 18 | Ht 64.0 in | Wt 384.6 lb

## 2023-04-25 DIAGNOSIS — Z6841 Body Mass Index (BMI) 40.0 and over, adult: Secondary | ICD-10-CM | POA: Diagnosis not present

## 2023-04-25 DIAGNOSIS — Z7984 Long term (current) use of oral hypoglycemic drugs: Secondary | ICD-10-CM

## 2023-04-25 DIAGNOSIS — E1169 Type 2 diabetes mellitus with other specified complication: Secondary | ICD-10-CM | POA: Diagnosis not present

## 2023-04-25 DIAGNOSIS — E119 Type 2 diabetes mellitus without complications: Secondary | ICD-10-CM

## 2023-04-25 DIAGNOSIS — I10 Essential (primary) hypertension: Secondary | ICD-10-CM | POA: Diagnosis not present

## 2023-04-25 DIAGNOSIS — Z7985 Long-term (current) use of injectable non-insulin antidiabetic drugs: Secondary | ICD-10-CM

## 2023-04-26 ENCOUNTER — Encounter: Payer: Self-pay | Admitting: Family Medicine

## 2023-04-26 LAB — MICROALBUMIN / CREATININE URINE RATIO
Creatinine, Urine: 153 mg/dL
Microalb/Creat Ratio: 6 mg/g{creat} (ref 0–29)
Microalbumin, Urine: 9.8 ug/mL

## 2023-04-26 NOTE — Progress Notes (Signed)
Established Patient Office Visit  Subjective    Patient ID: Alexa [name], female    DOB: 1976/02/14  Age: 48 y.o. MRN: [id number]  CC:  Chief Complaint  Patient presents with   Follow-up    6 month    HPI Alexa [name] presents for routine follow up of chronic med issues including diabetes and hypertension. Patient denies acute complaints.  Outpatient Encounter Medications as of 04/25/2023  Medication Sig   acetaminophen (TYLENOL) 500 MG tablet Take 1,000 mg by mouth daily as needed for mild pain.   azithromycin (ZITHROMAX) 250 MG tablet Take by mouth as directed per package instructions   blood glucose meter kit and supplies KIT Use up to four times daily as directed.   buPROPion (WELLBUTRIN SR) 150 MG 12 hr tablet Take 1 tablet (150 mg total) by mouth daily.   cetirizine (ZYRTEC) 10 MG tablet Take 1 tablet (10 mg total) by mouth daily.   cyanocobalamin (VITAMIN B12) 500 MCG tablet [id number] mcg once daily   furosemide (LASIX) 40 MG tablet Take 1 tablet (40 mg total) by mouth 2 (two) times daily.   glucose blood test strip Use to check blood sugar three times daily as instructed   HYDROcodone-acetaminophen (NORCO/VICODIN) 5-325 MG tablet Take 1 tablet by mouth every 8 (eight) hours as needed for pain.   Lancets (ONETOUCH ULTRASOFT) lancets Test daily Use as instructed dm2   metFORMIN (GLUCOPHAGE) 500 MG tablet Take 1 tablet (500 mg total) by mouth 2 (two) times daily with a meal.   Misc. Devices MISC Bariatric size shower chair with handle 423 lbs, BMI 72  Dx. Pulmonary HTN, DM2 with neuropathy, morbid obesity   potassium chloride (KLOR-CON M) 10 MEQ tablet Take 1 tablet (10 mEq total) by mouth 2 (two) times daily.   Semaglutide,0.25 or 0.5MG /DOS, (OZEMPIC, 0.25 OR 0.5 MG/DOSE,) 2 MG/3ML SOPN Inject 0.25 mg into the skin once a week.   Vitamin D, Ergocalciferol, (DRISDOL) 1.25 MG (50000 UNIT) CAPS capsule Take 1 capsule (50,000 Units total) by mouth every 7 (seven) days.    warfarin (COUMADIN) 7.5 MG tablet Take 1 to 1.5 tablets by mouth daily or as directed by Coumadin clinic   No facility-administered encounter medications on file as of 04/25/2023.    Past Medical History:  Diagnosis Date   Anemia    Back pain    Bilateral swelling of feet    CHF (congestive heart failure) (HCC)    Diabetes mellitus without complication (HCC)    [employer] 03/02/2020   DM2 (diabetes mellitus, type 2) (HCC)    Hypertension    Joint pain    Nocturnal hypoxia    Obstructive sleep apnea syndrome, moderate    Pulmonary embolism (HCC) 05/14/2016   submassive, on chronic anticoagulation   Pulmonary hypertension (HCC)    Vitamin D deficiency     Past Surgical History:  Procedure Laterality Date   CESAREAN SECTION      Family History  Problem Relation Age of Onset   Diabetes Maternal Grandmother    Breast cancer Maternal Grandmother        breast cancer    Brain cancer Maternal Grandmother    Hypertension Mother    Obesity Mother    Heart disease Father    Obesity Father    Breast cancer Maternal Aunt 35   Breast cancer Maternal Aunt 39    Social History   Socioeconomic History   Marital status: Divorced    Spouse name: n/a  Number of children: 2   Years of education: Not on file   Highest education level: Not on file  Occupational History   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   Ex-husband lives locally.    Social Drivers of Corporate investment banker Strain: Low Risk  (04/25/2023)   Overall Financial Resource Strain (CARDIA)    Difficulty of Paying Living Expenses: Not hard at all  Food Insecurity: No Food Insecurity (04/25/2023)   Hunger Vital Sign    Worried About Running Out of Food in the Last Year: Never true    Ran Out of Food in the Last Year: Never true  Transportation Needs: No Transportation Needs (04/25/2023)   PRAPARE - Administrator, Civil Service (Medical): No    Lack of Transportation (Non-Medical): No  Physical Activity: Insufficiently Active (04/25/2023)   Exercise Vital Sign    Days of Exercise per Week: 3 days    Minutes of Exercise per Session: 30 min  Stress: No Stress Concern Present (04/25/2023)   [employer] of Occupational Health - Occupational Stress Questionnaire    Feeling of Stress : Only a little  Social Connections: Moderately Integrated (04/25/2023)   Social Connection and Isolation Panel [NHANES]    Frequency of Communication with Friends and Family: More than three times a week    Frequency of Social Gatherings with Friends and Family: More than three times a week    Attends Religious Services: More than 4 times per year    Active Member of [employer] or Organizations: Yes    Attends Banker Meetings: More than 4 times per year    Marital Status: Divorced  Intimate Partner Violence: Not At Risk (04/25/2023)   Humiliation, Afraid, Rape, and Kick questionnaire    Fear of Current or Ex-Partner: No    Emotionally Abused: No    Physically Abused: No    Sexually Abused: No    Review of Systems  All other systems reviewed and are negative.       Objective    BP 114/80 (BP Location: Right Arm, Patient Position: Sitting, Cuff Size: Large)   Pulse 67   Temp 98.5 F (36.9 C) (Oral)   Resp 18   Ht 5\' 4"  (1.626 m)   Wt (!) 384 lb 9.6 oz (174.5 kg)   SpO2 96%   BMI 66.02 kg/m   Physical Exam Vitals and nursing note  reviewed.  Constitutional:      General: She is not in acute distress.    Appearance: She is obese.  Cardiovascular:     Rate and Rhythm: Normal rate and regular rhythm.  Pulmonary:     Effort: Pulmonary effort is normal.     Breath sounds: Normal breath sounds.  Neurological:     General: No focal deficit present.  Mental Status: She is alert and oriented to person, place, and time.         Assessment & Plan:   Type 2 diabetes mellitus with other specified complication, unspecified whether long term insulin use (HCC) -     Microalbumin / creatinine urine ratio  Essential hypertension  BMI 60.0-69.9, adult (HCC)  Long-term current use of injectable noninsulin antidiabetic medication  Diabetes mellitus treated with oral medication (HCC)     Return in about 3 months (around 07/24/2023) for follow up.   Tommie Raymond, MD

## 2023-04-30 ENCOUNTER — Encounter: Payer: Self-pay | Admitting: Family Medicine

## 2023-05-06 ENCOUNTER — Ambulatory Visit (INDEPENDENT_AMBULATORY_CARE_PROVIDER_SITE_OTHER): Payer: Medicaid Other | Admitting: Physician Assistant

## 2023-05-06 ENCOUNTER — Other Ambulatory Visit: Payer: Self-pay

## 2023-05-06 ENCOUNTER — Encounter (INDEPENDENT_AMBULATORY_CARE_PROVIDER_SITE_OTHER): Payer: Self-pay

## 2023-05-06 ENCOUNTER — Other Ambulatory Visit (HOSPITAL_COMMUNITY): Payer: Self-pay

## 2023-05-06 ENCOUNTER — Telehealth (INDEPENDENT_AMBULATORY_CARE_PROVIDER_SITE_OTHER): Payer: Self-pay | Admitting: *Deleted

## 2023-05-06 ENCOUNTER — Encounter (INDEPENDENT_AMBULATORY_CARE_PROVIDER_SITE_OTHER): Payer: Self-pay | Admitting: Physician Assistant

## 2023-05-06 VITALS — BP 115/65 | HR 109 | Temp 98.3°F | Ht 64.0 in | Wt 376.0 lb

## 2023-05-06 DIAGNOSIS — F5089 Other specified eating disorder: Secondary | ICD-10-CM

## 2023-05-06 DIAGNOSIS — E1169 Type 2 diabetes mellitus with other specified complication: Secondary | ICD-10-CM

## 2023-05-06 DIAGNOSIS — E669 Obesity, unspecified: Secondary | ICD-10-CM

## 2023-05-06 DIAGNOSIS — R600 Localized edema: Secondary | ICD-10-CM | POA: Diagnosis not present

## 2023-05-06 DIAGNOSIS — E538 Deficiency of other specified B group vitamins: Secondary | ICD-10-CM | POA: Diagnosis not present

## 2023-05-06 DIAGNOSIS — E559 Vitamin D deficiency, unspecified: Secondary | ICD-10-CM | POA: Diagnosis not present

## 2023-05-06 DIAGNOSIS — E876 Hypokalemia: Secondary | ICD-10-CM

## 2023-05-06 DIAGNOSIS — F3289 Other specified depressive episodes: Secondary | ICD-10-CM

## 2023-05-06 DIAGNOSIS — Z7984 Long term (current) use of oral hypoglycemic drugs: Secondary | ICD-10-CM

## 2023-05-06 DIAGNOSIS — Z6841 Body Mass Index (BMI) 40.0 and over, adult: Secondary | ICD-10-CM

## 2023-05-06 DIAGNOSIS — Z7985 Long-term (current) use of injectable non-insulin antidiabetic drugs: Secondary | ICD-10-CM

## 2023-05-06 MED ORDER — POTASSIUM CHLORIDE CRYS ER 10 MEQ PO TBCR
10.0000 meq | EXTENDED_RELEASE_TABLET | Freq: Two times a day (BID) | ORAL | 5 refills | Status: DC
  Filled 2023-05-06: qty 60, 30d supply, fill #0

## 2023-05-06 MED ORDER — VITAMIN D (ERGOCALCIFEROL) 1.25 MG (50000 UNIT) PO CAPS
50000.0000 [IU] | ORAL_CAPSULE | ORAL | 0 refills | Status: DC
  Filled 2023-05-06: qty 4, 28d supply, fill #0

## 2023-05-06 MED ORDER — OZEMPIC (0.25 OR 0.5 MG/DOSE) 2 MG/3ML ~~LOC~~ SOPN
0.2500 mg | PEN_INJECTOR | SUBCUTANEOUS | 0 refills | Status: DC
  Filled 2023-05-06: qty 3, 56d supply, fill #0

## 2023-05-06 MED ORDER — FUROSEMIDE 40 MG PO TABS
40.0000 mg | ORAL_TABLET | Freq: Two times a day (BID) | ORAL | 1 refills | Status: DC
  Filled 2023-05-06: qty 180, 90d supply, fill #0

## 2023-05-06 MED ORDER — BUPROPION HCL ER (SR) 150 MG PO TB12
150.0000 mg | ORAL_TABLET | Freq: Every day | ORAL | 0 refills | Status: DC
  Filled 2023-05-06: qty 90, 90d supply, fill #0

## 2023-05-06 MED ORDER — METFORMIN HCL 500 MG PO TABS
500.0000 mg | ORAL_TABLET | Freq: Two times a day (BID) | ORAL | 0 refills | Status: DC
Start: 2023-05-06 — End: 2023-06-10
  Filled 2023-05-06: qty 60, 30d supply, fill #0

## 2023-05-06 MED ORDER — CYANOCOBALAMIN 500 MCG PO TABS: ORAL_TABLET | ORAL | Status: DC

## 2023-05-06 NOTE — Progress Notes (Signed)
 [address] [place], [place], [place] [address] Office: [phone]  /  Fax: [phone]   WEIGHT SUMMARY AND BIOMETRICS  Vitals Temp: 98.3 F (36.8 C) BP: 115/65 Pulse Rate: (!) 109 SpO2: 98 %   Anthropometric Measurements Height: 5\' 4"  (1.626 m) Weight: (!) 376 lb (170.6 kg) BMI (Calculated): 64.51 Weight at Last Visit: 384 lb Weight Lost Since Last Visit: 8 lb Weight Gained Since Last Visit: 0 Starting Weight: 434 lb Total Weight Loss (lbs): 54 lb (24.5 kg)   Body Composition  Body Fat %: 66.9 % Fat Mass (lbs): 251.6 lbs Muscle Mass (lbs): 118.4 lbs Visceral Fat Rating : 31     No data recorded Today's Visit #: 38   Starting Date: 06/22/20    Chief Complaint: OBESITY  HPI  Since last office visit she has lost 8 pounds. She reports good adherence to reduced calorie nutritional plan. She has been working on reading food labels, not skipping meals, increasing protein intake at every meal, eating more vegetables, drinking more water, avoiding or reducing simple and processed carbohydrates, making healthier choices, working on meal prepping, reducing portion sizes, mindfulness around eating, and incorporating more whole foods   Discussed the use of AI scribe software for clinical note transcription with the patient, who gave verbal consent to proceed.  History of Present Illness         Alexa [name] is a 48 year old female with obesity, type 2 diabetes, and vitamin deficiencies who presents for follow-up on her obesity treatment plan.  She has been actively working on weight reduction and has successfully lost 54 pounds. She is currently taking Rybelsus  . She is adhering to her nutrition plan but believes there is room for further improvement and is working on resuming meal preparation. She is interested in adopting a pescatarian diet and is making efforts to increase her consumption of vegetables and fruits.  Her type 2 diabetes was diagnosed in 2018 with  A1c of 6.7, but and her recent A1c is 5.8. She is on Rybelsus  and metformin . Her insulin  level has decreased from 20.2 to 17.8. She reports no tingling sensations and has discontinued gabapentin  for neuropathic pain as it did not provide noticeable relief.  She has vitamin D  and B12 deficiencies. Her vitamin D  level has improved to 36.1 but remains below the target range of 50-70, and she is taking vitamin D  supplements. She has not been taking over-the-counter vitamin B12 supplements but plans to resume.  Her B12 level is 177, which is low, and she is aware that metformin  can affect B12 absorption. She is on chronic anticoagulation with warfarin and also takes furosemide , Lasix , potassium, and bupropion .  She has not had a colonoscopy yet. She has had a mammogram, but none since 2023.    Barriers identified: none.   Pharmacotherapy for weight loss: She is currently taking  Rybelsus  for primary indication of Type 2 diabetes .    ASSESSMENT AND PLAN  TREATMENT PLAN FOR OBESITY:  Recommended Dietary Goals  Alexa [name] is currently in the action stage of change. As such, her goal is to continue weight management plan. She has agreed to: follow the Category 3 plan - 1500 kcal per day, continue current plan, and Pescatarian plan also discussed  Behavioral Intervention  We discussed the following Behavioral Modification Strategies today: increasing lean protein intake to established goals, decreasing simple carbohydrates , increasing vegetables, increasing fiber rich foods, work on meal planning and preparation, keeping healthy foods at home, practice  mindfulness eating and understand the difference between hunger signals and cravings, planning for success, and continue to work on maintaining a reduced calorie state, getting the recommended amount of protein, incorporating whole foods, making healthy choices, staying well hydrated and practicing mindfulness when eating..  Additional resources provided  today: Cat. 3 plan and pescatarian plan  Recommended Physical Activity Goals  Sania has been advised to work up to 150 minutes of moderate intensity aerobic activity a week and strengthening exercises 2-3 times per week for cardiovascular health, weight loss maintenance and preservation of muscle mass.   She has agreed to :  Continue current level of physical activity , Think about enjoyable ways to increase daily physical activity and overcoming barriers to exercise, and Increase physical activity in their day and reduce sedentary time (increase NEAT).  Pharmacotherapy We discussed various medication options to help Alexa [name] with her weight loss efforts and we both agreed to : Continue Rybelsus  for now for primary indication of Type 2 diabetes, but we are trying to see if she can obtain Ozempic  again which was very successful in the past .   ASSOCIATED CONDITIONS ADDRESSED TODAY  Type 2 diabetes mellitus with other specified complication, unspecified whether long term insulin  use (HCC) -     Ozempic  (0.25 or 0.5 MG/DOSE); Inject 0.25 mg into the skin once a week.  Dispense: 3 mL; Refill: 0 -     metFORMIN  HCl; Take 1 tablet (500 mg total) by mouth 2 (two) times daily with a meal.  Dispense: 60 tablet; Refill: 0  Vitamin D  deficiency -     Vitamin D  (Ergocalciferol ); Take 1 capsule (50,000 Units total) by mouth every 7 (seven) days.  Dispense: 13 capsule; Refill: 0  B12 deficiency -     Cyanocobalamin ; [phone] mcg once daily  Bilateral lower extremity edema -     Furosemide ; Take 1 tablet (40 mg total) by mouth 2 (two) times daily.  Dispense: 180 tablet; Refill: 1  Decreased potassium in the blood -     Potassium Chloride  Crys ER; Take 1 tablet (10 mEq total) by mouth 2 (two) times daily.  Dispense: 60 tablet; Refill: 5  Emotional Eating Behavior -     buPROPion  HCl ER (SR); Take 1 tablet (150 mg total) by mouth daily.  Dispense: 90 tablet; Refill: 0  Obesity, Beginning BMI  74.50  BMI 60.0-69.9, adult (HCC) Current BMI 64.6    Assessment and Plan         Obesity Alexa [name] is seen for follow-up on her obesity management. She has lost 54 pounds. The goal is continued weight reduction to mitigate risks of cardiovascular disease, cancer, and joint issues. Discussed benefits of weight loss, including reduced risk of myocardial infarction, cerebrovascular accident, cancer, and arthritis. Encouraged meal planning and prepping to sustain progress. - Resubmit prior authorization for Ozempic  - Encourage meal planning and prepping - Provide pescatarian meal plan  Type 2 Diabetes Mellitus Diagnosed in 2018. Current A1c is 5.8%. She is on Rybelsus  and metformin . The goal is to improve glycemic control and reduce insulin  levels. Discussed benefits of Ozempic  in lowering insulin  levels and improving glycemic control. Encouraged dietary modifications to reduce saturated fat intake to help lower LDL cholesterol. - Continue Rybelsus  Start Ozempic  and submit for prior approval and stop Rybelsus  once Ozempic  begun  She is working  on nutrition plan to decrease simple carbohydrates, increase lean proteins and exercise to promote weight loss and improve glycemic control . - Monitor blood glucose  levels - Encourage dietary modifications to reduce saturated fat intake  Hyperlipidemia Cholesterol levels: Total 174, HDL 48, LDL 104, triglycerides 124. LDL is slightly elevated for a patient with type 2 diabetes; the goal is to reduce LDL to 70 or less. Discussed benefits of exercise and dietary modifications in reducing LDL levels. Encouraged avoiding fried foods and red meat to help lower LDL cholesterol. - Encourage exercise - Advise on dietary modifications to reduce saturated fat intake Continue to work on nutrition plan -decreasing simple carbohydrates, increasing lean proteins, decreasing saturated fats and cholesterol , avoiding trans fats and exercise as able to promote  weight loss, improve lipids and decrease cardiovascular risks.  Vitamin D  Deficiency Vitamin D  level is 36.1, improved but still below the target range of 50-80. Discussed goal of reaching a vitamin D  level of 50-80 to enhance overall health and energy levels. - Refill vitamin D  prescription  Vitamin B12 Deficiency Vitamin B12 level is 177, which is low. She has not been taking over-the-counter B12 supplements. B12 deficiency can contribute to peripheral neuropathy and low energy levels. Discussed importance of B12 supplementation to prevent neuropathy and improve energy levels. Recommended 500 mcg sublingual B12 tablets for better absorption. - Prescribe 500 mcg sublingual B12 tablets - Encourage consistent use of B12 supplements  Iron  Deficiency Iron  saturation is slightly low, with hemoglobin and hematocrit on the lower side. Discussed increasing iron  intake through diet, including consuming green leafy vegetables and cooking with an iron  skillet to improve iron  levels. - Encourage consumption of green leafy vegetables - Advise cooking with an iron  skillet  General Health Maintenance She is 48 years old and has not had a colonoscopy or recent mammogram. She is at higher risk for breast cancer due to her weight and being a black female. Discussed importance of colonoscopy screening starting at age 57 and annual mammograms to monitor for breast cancer. - Recommend colonoscopy screening - Recommend annual mammogram - Discuss with primary care physician for referrals  Lower extremity edema Responds well to Lasix  and taking Kcl for potassium replacement. No side effects.  Continue /Refill Lasix  and KCL  Emotional eating On Wellbutrin  for emotional eating behaviors and feels helpful. No side effects.  Continue and refill Wellbutrin     PHYSICAL EXAM:  Blood pressure 115/65, pulse (!) 109, temperature 98.3 F (36.8 C), height 5\' 4"  (1.626 m), weight (!) 376 lb (170.6 kg), SpO2 98%. Body  mass index is 64.54 kg/m.  General: She is overweight, cooperative, alert, well developed, and in no acute distress. PSYCH: Has normal mood, affect and thought process.   HEENT: EOMI, sclerae are anicteric. Lungs: Normal breathing effort, no conversational dyspnea. Extremities: No edema.  Neurologic: No gross sensory or motor deficits. No tremors or fasciculations noted.    DIAGNOSTIC DATA REVIEWED:  BMET    Component Value Date/Time   NA 137 04/08/2023 1039   K 4.3 04/08/2023 1039   CL 98 04/08/2023 1039   CO2 24 04/08/2023 1039   GLUCOSE 77 04/08/2023 1039   GLUCOSE 102 (H) 06/29/2017 0515   BUN 10 04/08/2023 1039   CREATININE 0.82 04/08/2023 1039   CREATININE 0.84 05/24/2017 1117   CREATININE 0.90 01/28/2016 1626   CALCIUM  8.8 04/08/2023 1039   GFRNONAA 80 03/02/2020 1502   GFRNONAA >60 05/24/2017 1117   GFRNONAA 81 01/28/2016 1626   GFRAA 92 03/02/2020 1502   GFRAA >60 05/24/2017 1117   GFRAA >89 01/28/2016 1626   Lab Results  Component Value Date   HGBA1C  5.8 (H) 04/08/2023   HGBA1C 5.7 (H) 09/06/2022   HGBA1C 5.6 04/11/2022   HGBA1C 5.8 (H) 10/11/2021   HGBA1C 5.6 02/08/2021   Lab Results  Component Value Date   INSULIN  17.8 04/08/2023   INSULIN  20.2 09/06/2022   INSULIN  16.0 04/11/2022   INSULIN  17.2 10/11/2021   INSULIN  17.6 02/08/2021   Lab Results  Component Value Date   TSH 1.490 04/11/2022   CBC    Component Value Date/Time   WBC 6.1 04/08/2023 1039   WBC 7.3 06/27/2017 0519   RBC 3.98 04/08/2023 1039   RBC 5.21 (H) 06/27/2017 0519   HGB 11.8 04/08/2023 1039   HCT 36.2 04/08/2023 1039   PLT 424 04/08/2023 1039   MCV 91 04/08/2023 1039   MCH 29.6 04/08/2023 1039   MCH 25.9 (L) 06/27/2017 0519   MCHC 32.6 04/08/2023 1039   MCHC 28.4 (L) 06/27/2017 0519   RDW 15.9 (H) 04/08/2023 1039   Iron  Studies    Component Value Date/Time   IRON  41 04/08/2023 1039   TIBC 313 04/08/2023 1039   FERRITIN 59 04/08/2023 1039   IRONPCTSAT 13 (L)  04/08/2023 1039   IRONPCTSAT 9 (L) 10/08/2015 1505   Lipid Panel     Component Value Date/Time   CHOL 174 04/08/2023 1039   TRIG 124 04/08/2023 1039   HDL 48 04/08/2023 1039   CHOLHDL 3.8 11/06/2021 1055   CHOLHDL 4.6 01/28/2016 1626   VLDL 17 01/28/2016 1626   LDLCALC 104 (H) 04/08/2023 1039   LDLDIRECT 100 10/08/2015 1505   Lab Results  Component Value Date   CHOL 174 04/08/2023   HDL 48 04/08/2023   LDLCALC 104 (H) 04/08/2023   LDLDIRECT 100 10/08/2015   TRIG 124 04/08/2023   Hepatic Function Panel     Component Value Date/Time   PROT 7.2 04/08/2023 1039   ALBUMIN 4.2 04/08/2023 1039   AST 14 04/08/2023 1039   AST 24 05/24/2017 1117   ALT 11 04/08/2023 1039   ALT 21 05/24/2017 1117   ALKPHOS 64 04/08/2023 1039   BILITOT 0.3 04/08/2023 1039   BILITOT 0.5 05/24/2017 1117      Component Value Date/Time   TSH 1.490 04/11/2022 1043   Nutritional Lab Results  Component Value Date   VD25OH 36.1 04/08/2023   VD25OH 33.8 09/06/2022   VD25OH 22.9 (L) 04/11/2022     Return in about 4 weeks (around 06/03/2023).Aaron Aas She was informed of the importance of frequent follow up visits to maximize her success with intensive lifestyle modifications for her multiple health conditions.   ATTESTASTION STATEMENTS:  Reviewed by clinician on day of visit: allergies, medications, problem list, medical history, surgical history, family history, social history, and previous encounter notes.     Perri Aragones,PA-C

## 2023-05-06 NOTE — Telephone Encounter (Signed)
 Prior authorization for Ozempic , 0.25 or 0.5mg /dose has been sent to insurance thru cover my meds, waiting for approval or denial.

## 2023-05-08 ENCOUNTER — Ambulatory Visit: Payer: Medicaid Other | Attending: Cardiovascular Disease

## 2023-05-08 DIAGNOSIS — Z7901 Long term (current) use of anticoagulants: Secondary | ICD-10-CM

## 2023-05-08 LAB — POCT INR: INR: 2.7 (ref 2.0–3.0)

## 2023-05-08 NOTE — Patient Instructions (Signed)
Description   Continue taking Warfarin 1.5 tablets daily except 2 tablets on Sundays.  Recheck INR 6 weeks.  Cardiac Clearance Fax # [phone] [facility] [phone]

## 2023-05-08 NOTE — Telephone Encounter (Signed)
Received through Cover My Meds:  Approved. OZEMPIC (0.25 OR 0.5 MG/DOSE) 2MG /3ML Soln Pen-inj is approved from 05/06/2023 to 05/05/2024.Marland Kitchen Authorization Expiration Date: May 05, 2024.

## 2023-05-09 ENCOUNTER — Other Ambulatory Visit: Payer: Self-pay | Admitting: Family Medicine

## 2023-05-09 DIAGNOSIS — Z1231 Encounter for screening mammogram for malignant neoplasm of breast: Secondary | ICD-10-CM

## 2023-05-09 DIAGNOSIS — Z1211 Encounter for screening for malignant neoplasm of colon: Secondary | ICD-10-CM

## 2023-05-15 ENCOUNTER — Other Ambulatory Visit: Payer: Self-pay

## 2023-06-10 ENCOUNTER — Ambulatory Visit (INDEPENDENT_AMBULATORY_CARE_PROVIDER_SITE_OTHER): Payer: Medicaid Other | Admitting: Physician Assistant

## 2023-06-10 ENCOUNTER — Other Ambulatory Visit (HOSPITAL_COMMUNITY): Payer: Self-pay

## 2023-06-10 ENCOUNTER — Other Ambulatory Visit: Payer: Self-pay

## 2023-06-10 ENCOUNTER — Encounter (INDEPENDENT_AMBULATORY_CARE_PROVIDER_SITE_OTHER): Payer: Self-pay | Admitting: Physician Assistant

## 2023-06-10 VITALS — BP 102/59 | HR 69 | Temp 98.6°F | Ht 64.0 in | Wt 382.0 lb

## 2023-06-10 DIAGNOSIS — E559 Vitamin D deficiency, unspecified: Secondary | ICD-10-CM | POA: Diagnosis not present

## 2023-06-10 DIAGNOSIS — E1169 Type 2 diabetes mellitus with other specified complication: Secondary | ICD-10-CM

## 2023-06-10 DIAGNOSIS — Z7985 Long-term (current) use of injectable non-insulin antidiabetic drugs: Secondary | ICD-10-CM

## 2023-06-10 DIAGNOSIS — Z7984 Long term (current) use of oral hypoglycemic drugs: Secondary | ICD-10-CM

## 2023-06-10 DIAGNOSIS — F3289 Other specified depressive episodes: Secondary | ICD-10-CM

## 2023-06-10 DIAGNOSIS — F32A Depression, unspecified: Secondary | ICD-10-CM

## 2023-06-10 DIAGNOSIS — Z6841 Body Mass Index (BMI) 40.0 and over, adult: Secondary | ICD-10-CM

## 2023-06-10 DIAGNOSIS — E538 Deficiency of other specified B group vitamins: Secondary | ICD-10-CM

## 2023-06-10 DIAGNOSIS — R600 Localized edema: Secondary | ICD-10-CM

## 2023-06-10 MED ORDER — METFORMIN HCL 500 MG PO TABS
500.0000 mg | ORAL_TABLET | Freq: Two times a day (BID) | ORAL | 0 refills | Status: DC
  Filled 2023-06-10: qty 60, 30d supply, fill #0

## 2023-06-10 MED ORDER — SEMAGLUTIDE (1 MG/DOSE) 4 MG/3ML ~~LOC~~ SOPN
1.0000 mg | PEN_INJECTOR | SUBCUTANEOUS | 0 refills | Status: DC
  Filled 2023-06-10 – 2023-06-18 (×5): qty 3, 28d supply, fill #0

## 2023-06-10 MED ORDER — VITAMIN D (ERGOCALCIFEROL) 1.25 MG (50000 UNIT) PO CAPS
50000.0000 [IU] | ORAL_CAPSULE | ORAL | 0 refills | Status: DC
  Filled 2023-06-10: qty 4, 28d supply, fill #0

## 2023-06-10 NOTE — Progress Notes (Signed)
 SUBJECTIVE: Discussed the use of AI scribe software for clinical note transcription with the patient, who gave verbal consent to proceed.  Chief Complaint: Obesity  Interim History: She is up 6 lbs since last visit.   Alexa [name] is here to discuss her progress with her obesity treatment plan. She is on the Category 3 Plan and states she is following her eating plan approximately 85 % of the time. She states she is exercising walking 10 minutes 4 times per week.  Alexa [name] is a 48 year old female with obesity who presents for follow-up of her obesity treatment plan.  She is currently on Ozempic, having recently increased her dose from 0.25 mg to 0.5 mg weekly without adverse effects. She previously used Rybelsus but does not recall the dosage. She is not currently monitoring her blood sugars but plans to start checking them a few times a week, focusing on fasting sugars.  She is also taking bupropion 150 mg daily and has been abstaining from sweets, substituting them with fruits like apples and unsweetened applesauce.  She has a history of type 2 diabetes and is also taking metformin 500 mg twice daily without issues in addition to Ozempic.  She is managing vitamin D and B12 deficiencies with ergocalciferol 50,000 units once weekly and vitamin B12 500 mcg daily. She needs a refill for her vitamin D. Her energy levels have improved significantly.  She is taking Lasix 40 mg twice daily and potassium chloride 10 mcg twice daily for bilateral lower extremity edema. She is also on warfarin 7.5 mg, taking one to one and a half tablets daily as directed by the warfarin clinic.  She is unable to walk outside due to roaming dogs in her neighborhood but engages in indoor walking and exercises while waiting for things like the microwave. She is interested in strengthening exercises to help with body fat and muscle tone. OBJECTIVE: Visit Diagnoses: Problem List Items Addressed This Visit      Vitamin D deficiency   Relevant Medications   Vitamin D, Ergocalciferol, (DRISDOL) 1.25 MG (50000 UNIT) CAPS capsule   Bilateral lower extremity edema   Type 2 diabetes mellitus with other specified complication (HCC) - Primary   Relevant Medications   Semaglutide, 1 MG/DOSE, 4 MG/3ML SOPN (Start on 06/21/2023)   metFORMIN (GLUCOPHAGE) 500 MG tablet   B12 deficiency   Depression   Obesity, Beginning BMI 74.50   Relevant Medications   Semaglutide, 1 MG/DOSE, 4 MG/3ML SOPN (Start on 06/21/2023)   metFORMIN (GLUCOPHAGE) 500 MG tablet  Obesity Currently on Ozempic 0.25 mg weekly, she has taken one dose of 0.5 mg without adverse effects. Plan to increase the dose to 1 mg after another dose of 0.5 mg. She is also on bupropion, which may help with cravings, and is abstaining from sweets. Engaging in indoor physical activity due to neighborhood safety concerns, she is interested in strengthening exercises to improve muscle mass and metabolism. The decision to increase Ozempic is based on her previous tolerance to Rybelsus and absence of side effects, aiming to reach 2 mg in a stepwise fashion. - Administer one more dose of Ozempic 0.5 mg, then increase to 1 mg weekly - Encourage continued abstinence from sweets and consumption of fruits - Recommend indoor physical activities and strengthening exercises - Provide information on free exercise classes through [place] [employer]  Type 2 Diabetes Mellitus On metformin 500 mg twice daily and Ozempic 0.5 mg weekly. Not currently checking blood sugars but  agrees to start monitoring fasting blood sugars a few times a week to assess the need for further adjustment of her Ozempic dose. The decision to monitor blood sugars is to ensure safe titration of Ozempic and evaluate the potential increase to 2 mg. - Refill metformin 500 mg twice daily Continue Ozempic 0.5 mg weekly x 1 more week, then increase to 1 mg weekly if tolerating well.  - Instruct to  check fasting blood sugars a few times a week and keep a log - Review blood sugar log at next visit to assess for potential increase in Ozempic dose   Hypertension On Lasix 40 mg twice daily and potassium chloride 10 mcg twice daily. No issues with current management.  Bilateral Lower Extremity Edema On Lasix 40 mg twice daily. No issues with current management.  Vitamin D Deficiency On ergocalciferol 50,000 units once weekly. Needs a refill for her vitamin D medication. - Refill ergocalciferol 50,000 units once weekly  Vitamin B12 Deficiency On vitamin B12 500 mcg once daily. No issues with current management.  General Health Maintenance Engaging in lifestyle modifications, including abstaining from sweets and increasing physical activity. Interested in participating in free exercise classes and swimming activities for herself and her children. - Encourage continued lifestyle modifications, including healthy eating and physical activity - Provide information on free exercise classes and swimming opportunities  Follow-up Scheduled for a follow-up visit in one month to assess progress with the adjusted Ozempic dose and review her blood sugar log. - Schedule follow-up visit on Wednesday, July 10, 2023, at 10:30 AM  Vitals Temp: 98.6 F (37 C) BP: (!) 102/59 Pulse Rate: 69 SpO2: 98 %   Anthropometric Measurements Height: 5\' 4"  (1.626 m) Weight: (!) 382 lb (173.3 kg) BMI (Calculated): 65.54 Weight at Last Visit: 376 lb Weight Lost Since Last Visit: 0 Weight Gained Since Last Visit: 6 lb Starting Weight: 434 lb Total Weight Loss (lbs): 48 lb (21.8 kg)   Body Composition  Body Fat %: 68.9 % Fat Mass (lbs): 263.4 lbs Muscle Mass (lbs): 113 lbs Visceral Fat Rating : 32   Other Clinical Data Fasting: no Labs: no Today's Visit #: 39 Starting Date: 06/22/20     ASSESSMENT AND PLAN:  Diet: Alexa [name] is currently in the action stage of change. As such, her goal is to  continue with weight loss efforts and has agreed to the Category 3 Plan.   Exercise:  For substantial health benefits, adults should do at least 150 minutes (2 hours and 30 minutes) a week of moderate-intensity, or 75 minutes (1 hour and 15 minutes) a week of vigorous-intensity aerobic physical activity, or an equivalent combination of moderate- and vigorous-intensity aerobic activity. Aerobic activity should be performed in episodes of at least 10 minutes, and preferably, it should be spread throughout the week. and Adults should also include muscle-strengthening activities that involve all major muscle groups on 2 or more days a week.  Behavior Modification:  We discussed the following Behavioral Modification Strategies today: increasing lean protein intake, decreasing simple carbohydrates, increasing vegetables, increase H2O intake, increase high fiber foods, meal planning and cooking strategies, better snacking choices, emotional eating strategies , avoiding temptations, and planning for success. We discussed various medication options to help Alexa [name] with her weight loss efforts and we both agreed to continue Ozempic 0.5 mg weekly x 1 more dose, then increase to 1 mg weekly.  Return in about 4 weeks (around 07/08/2023).Marland Kitchen She was informed of the importance of frequent follow up  visits to maximize her success with intensive lifestyle modifications for her multiple health conditions.  Attestation Statements:   Reviewed by clinician on day of visit: allergies, medications, problem list, medical history, surgical history, family history, social history, and previous encounter notes.   Time spent on visit including pre-visit chart review and post-visit care and charting was 28 minutes  Cyani Kallstrom,PA-C

## 2023-06-11 ENCOUNTER — Other Ambulatory Visit (HOSPITAL_COMMUNITY): Payer: Self-pay

## 2023-06-11 ENCOUNTER — Other Ambulatory Visit: Payer: Self-pay

## 2023-06-12 ENCOUNTER — Other Ambulatory Visit (HOSPITAL_COMMUNITY): Payer: Self-pay

## 2023-06-13 ENCOUNTER — Other Ambulatory Visit (HOSPITAL_COMMUNITY): Payer: Self-pay

## 2023-06-14 ENCOUNTER — Other Ambulatory Visit (HOSPITAL_COMMUNITY): Payer: Self-pay

## 2023-06-17 ENCOUNTER — Telehealth: Payer: Self-pay | Admitting: *Deleted

## 2023-06-17 NOTE — Telephone Encounter (Signed)
Prior authorization done via cover my meds for patients Ozempic. Waiting on determination.  

## 2023-06-18 ENCOUNTER — Other Ambulatory Visit (HOSPITAL_COMMUNITY): Payer: Self-pay

## 2023-06-18 ENCOUNTER — Telehealth (INDEPENDENT_AMBULATORY_CARE_PROVIDER_SITE_OTHER): Payer: Self-pay | Admitting: *Deleted

## 2023-06-18 NOTE — Telephone Encounter (Signed)
 [employer] has approved the medication Ozempic(1 mg /Dose),  effective 02/10/.2025. The patient will need to contact her pharmacy if having issues [phone].  Patient has been updated concerning this thru Mychart.

## 2023-06-19 ENCOUNTER — Ambulatory Visit: Payer: Medicaid Other | Attending: Internal Medicine

## 2023-06-19 DIAGNOSIS — Z7901 Long term (current) use of anticoagulants: Secondary | ICD-10-CM

## 2023-06-19 LAB — POCT INR: INR: 2 (ref 2.0–3.0)

## 2023-06-19 NOTE — Patient Instructions (Signed)
 Description   Take 2 tablets today and then continue taking Warfarin 1.5 tablets daily except 2 tablets on Sundays.  Recheck INR 6 weeks.  Cardiac Clearance Fax # [phone] [facility] [phone]

## 2023-06-27 NOTE — Telephone Encounter (Signed)
 Copied from CRM [id number]. Topic: Referral - Question >> Jun 27, 2023  9:14 AM Tiffany S wrote: Reason for CRM: Patient is requesting a referral to ophthalmologist patient is requesting call back Please follow up with patient

## 2023-07-09 NOTE — Progress Notes (Unsigned)
 SUBJECTIVE: Discussed the use of AI scribe software for clinical note transcription with the patient, who gave verbal consent to proceed.  Chief Complaint: Obesity  Interim History: She is down 7 lbs since her last visit.   Alexa [name] is here to discuss her progress with her obesity treatment plan. She is on the Category 3 Plan and states she is following her eating plan approximately 80 % of the time. She states she is exercising walking for 10-15 minutes 3-4 times per week.  Alexa [name] is a 48 year old female who presents for follow-up of her obesity treatment plan.  She has lost seven pounds over the past month, attributing her success to dietary changes, including intermittent fasting and reducing sweets, opting for more natural sugars from fruits and vegetables. She has been making homemade frozen yogurt using [place] yogurt and fruit.  Her current medications include Ozempic 1 mg weekly, metformin 500 mg twice daily, bupropion SR 150 mg daily, vitamin B12 [phone] mcg daily, and ergocalciferol 50,000 units weekly for vitamin D deficiency. She also takes furosemide and potassium supplements, although she misplaced her potassium medication recently.  She monitors her blood sugar levels regularly, with the highest reading being 112 mg/dL and most readings around 90-99 mg/dL. She checks her blood sugar approximately six times every other day.  No swelling in her legs and she maintains good hydration, although she has noticed a decrease in water intake recently. She is mindful of her fluid balance due to her use of furosemide.  No side effects from Ozempic, such as nausea, vomiting, diarrhea, constipation, vision changes, or mood changes.  She is actively managing her diet to avoid waste and is conscious of portion control, especially during family gatherings. She ensures she consumes at least 100 grams of protein daily and continues to engage in regular physical activity, including  walking. OBJECTIVE: Visit Diagnoses: Problem List Items Addressed This Visit     Vitamin D deficiency   Relevant Medications   Vitamin D, Ergocalciferol, (DRISDOL) 1.25 MG (50000 UNIT) CAPS capsule   Bilateral lower extremity edema   Relevant Medications   furosemide (LASIX) 40 MG tablet   Type 2 diabetes mellitus with other specified complication (HCC) - Primary   Relevant Medications   metFORMIN (GLUCOPHAGE) 500 MG tablet   Semaglutide, 1 MG/DOSE, 4 MG/3ML SOPN   B12 deficiency   Depression   Relevant Medications   buPROPion (WELLBUTRIN SR) 150 MG 12 hr tablet   Obesity, Beginning BMI 74.50   Relevant Medications   metFORMIN (GLUCOPHAGE) 500 MG tablet   Semaglutide, 1 MG/DOSE, 4 MG/3ML SOPN   Decreased potassium in the blood   Relevant Medications   potassium chloride (KLOR-CON M) 10 MEQ tablet  Obesity She has lost seven pounds over the past month while on Ozempic 1 mg weekly and metformin 500 mg twice daily. She is incorporating dietary changes, such as reducing sweets and increasing natural sugars from fruits and vegetables, and engaging in intermittent fasting. She is maintaining muscle mass and reducing adipose tissue. Blood sugar levels are well-controlled, with the highest reading at 112 mg/dL and others around 70-35 mg/dL. No adverse effects from Ozempic have been reported. Future options include increasing the Ozempic dosage or switching to Mounjaro if weight loss plateaus. - Continue Ozempic 1 mg weekly. - Continue metformin 500 mg twice daily. - Encourage continuation of dietary changes and portion control. - Encourage regular physical activity, including walking. - Consider increasing Ozempic dosage if needed in the future. -  Consider switching to [facility] if weight loss plateaus. - Schedule follow-up appointment for May 21st with potential for fasting or non-fasting labs.  Type 2 Diabetes Mellitus with other specified complication, without long-term current use of  insulin HgbA1c is at goal. Last A1c was 5.8 CBGs: Fasting 90's to max of 110 Episodes of hypoglycemia: no Medication(s): Ozempic 1 mg SQ weekly  Lab Results  Component Value Date   HGBA1C 5.8 (H) 04/08/2023   HGBA1C 5.7 (H) 09/06/2022   HGBA1C 5.6 04/11/2022   Lab Results  Component Value Date   MICROALBUR 1.4 01/28/2016   LDLCALC 104 (H) 04/08/2023   CREATININE 0.82 04/08/2023   No results found for: "GFR"  Plan: Continue and refill Ozempic 1 mg SQ weekly She is working  on nutrition plan to decrease simple carbohydrates, increase lean proteins and exercise to promote weight loss and improve glycemic control . Meds ordered this encounter  Medications   buPROPion (WELLBUTRIN SR) 150 MG 12 hr tablet    Sig: Take 1 tablet (150 mg total) by mouth daily.    Dispense:  90 tablet    Refill:  0    PLEASE DISREGARD PREVIOUS PRESCRIPTION FOR 30 DAYS   furosemide (LASIX) 40 MG tablet    Sig: Take 1 tablet (40 mg total) by mouth 2 (two) times daily.    Dispense:  180 tablet    Refill:  1   metFORMIN (GLUCOPHAGE) 500 MG tablet    Sig: Take 1 tablet (500 mg total) by mouth 2 (two) times daily with a meal.    Dispense:  60 tablet    Refill:  0   potassium chloride (KLOR-CON M) 10 MEQ tablet    Sig: Take 1 tablet (10 mEq total) by mouth 2 (two) times daily.    Dispense:  60 tablet    Refill:  5   Semaglutide, 1 MG/DOSE, 4 MG/3ML SOPN    Sig: Inject 1 mg as directed once a week.    Dispense:  3 mL    Refill:  1   Vitamin D, Ergocalciferol, (DRISDOL) 1.25 MG (50000 UNIT) CAPS capsule    Sig: Take 1 capsule (50,000 Units total) by mouth every 7 (seven) days.    Dispense:  13 capsule    Refill:  0    PLEASE DISREGARD PREVIOUS PRESCRIPTION FOR 30 DAYS    Vitamin D Deficiency/ B 12 deficiency She is on ergocalciferol 50,000 units once weekly. She is also taking vitamin B12 [phone] mcg daily. No N/V or muscle weakness with Ergocalciferol. No side effects with B 12 supplement.  Last  vitamin D Lab Results  Component Value Date   VD25OH 36.1 04/08/2023   Lab Results  Component Value Date   VITAMINB12 177 (L) 04/08/2023  Low vitamin D levels can be associated with adiposity and may result in leptin resistance and weight gain. Also associated with fatigue.  Currently on vitamin D supplementation without any adverse effects such as nausea, vomiting or muscle weakness.  - Continue ergocalciferol 50,000 units once weekly. - Continue vitamin B12 [phone] mcg daily.  Bilateral lower extremity edema/Potassium Supplementation She is on furosemide and requires potassium supplementation. Her last potassium level was 4.3 mmol/L. She has not been taking potassium recently due to misplacing her medication but will resume. Adequate water intake is encouraged to support overall health and medication efficacy. - Refill lasix and potassium prescription. - Encourage adequate water intake. - Recheck CMET/potassium levels at next visit.  General Health Maintenance She is maintaining  a healthy lifestyle by cooking at home, avoiding food waste, and ensuring adequate protein intake. She is also monitoring her blood sugar levels regularly. - Encourage continued home cooking and portion control. - Encourage adequate protein intake of at least 100 grams per day. - Encourage regular monitoring of blood sugar levels.  Follow-up She has a follow-up appointment scheduled for May 21st. She also has a Coumadin clinic appointment on May 7th and a primary care appointment in August. - Ensure medication refills are sent to [facility] for delivery.  Vitals Temp: 98.1 F (36.7 C) BP: 123/80 Pulse Rate: 83 SpO2: 98 %   Anthropometric Measurements Height: 5\' 4"  (1.626 m) Weight: (!) 375 lb (170.1 kg) BMI (Calculated): 64.34 Weight at Last Visit: 382 lb Weight Lost Since Last Visit: 7 lb Weight Gained Since Last Visit: 0 Starting Weight: 434 lb Total Weight Loss (lbs): 59 lb (26.8 kg) Peak  Weight: 480 lb   Body Composition  Body Fat %: 69.1 % Fat Mass (lbs): 259.2 lbs Muscle Mass (lbs): 110.2 lbs Visceral Fat Rating : 32   Other Clinical Data Fasting: yes Labs: no Today's Visit #: 40 Starting Date: 06/22/20     ASSESSMENT AND PLAN:  Diet: Alexa [name] is currently in the action stage of change. As such, her goal is to continue with weight loss efforts. She has agreed to Category 3 Plan.  Exercise: Alexa [name] has been instructed to try a geriatric exercise plan and that some exercise is better than none for weight loss and overall health benefits.   Behavior Modification:  We discussed the following Behavioral Modification Strategies today: increasing lean protein intake, decreasing simple carbohydrates, increasing vegetables, increase H2O intake, increase high fiber foods, no skipping meals, meal planning and cooking strategies, emotional eating strategies , avoiding temptations, and planning for success. We discussed various medication options to help Alexa [name] with her weight loss efforts and we both agreed to continue to work on nutritional and behavioral strategies to promote weight loss.  .  Return in about 5 weeks (around 08/14/2023).Alexa [name] She was informed of the importance of frequent follow up visits to maximize her success with intensive lifestyle modifications for her multiple health conditions.  Attestation Statements:   Reviewed by clinician on day of visit: allergies, medications, problem list, medical history, surgical history, family history, social history, and previous encounter notes.   Time spent on visit including pre-visit chart review and post-visit care and charting was 25 minutes.    Steffani Dionisio, PA-C

## 2023-07-10 ENCOUNTER — Ambulatory Visit (INDEPENDENT_AMBULATORY_CARE_PROVIDER_SITE_OTHER): Admitting: Physician Assistant

## 2023-07-10 ENCOUNTER — Other Ambulatory Visit: Payer: Self-pay | Admitting: Family Medicine

## 2023-07-10 ENCOUNTER — Other Ambulatory Visit: Payer: Self-pay

## 2023-07-10 ENCOUNTER — Other Ambulatory Visit (HOSPITAL_COMMUNITY): Payer: Self-pay

## 2023-07-10 ENCOUNTER — Encounter (INDEPENDENT_AMBULATORY_CARE_PROVIDER_SITE_OTHER): Payer: Self-pay | Admitting: Physician Assistant

## 2023-07-10 VITALS — BP 123/80 | HR 83 | Temp 98.1°F | Ht 64.0 in | Wt 375.0 lb

## 2023-07-10 DIAGNOSIS — F3289 Other specified depressive episodes: Secondary | ICD-10-CM

## 2023-07-10 DIAGNOSIS — R600 Localized edema: Secondary | ICD-10-CM

## 2023-07-10 DIAGNOSIS — Z7985 Long-term (current) use of injectable non-insulin antidiabetic drugs: Secondary | ICD-10-CM

## 2023-07-10 DIAGNOSIS — E1169 Type 2 diabetes mellitus with other specified complication: Secondary | ICD-10-CM | POA: Diagnosis not present

## 2023-07-10 DIAGNOSIS — Z7984 Long term (current) use of oral hypoglycemic drugs: Secondary | ICD-10-CM

## 2023-07-10 DIAGNOSIS — E538 Deficiency of other specified B group vitamins: Secondary | ICD-10-CM

## 2023-07-10 DIAGNOSIS — E876 Hypokalemia: Secondary | ICD-10-CM

## 2023-07-10 DIAGNOSIS — E669 Obesity, unspecified: Secondary | ICD-10-CM

## 2023-07-10 DIAGNOSIS — E559 Vitamin D deficiency, unspecified: Secondary | ICD-10-CM

## 2023-07-10 DIAGNOSIS — F32A Depression, unspecified: Secondary | ICD-10-CM

## 2023-07-10 MED ORDER — VITAMIN D (ERGOCALCIFEROL) 1.25 MG (50000 UNIT) PO CAPS
50000.0000 [IU] | ORAL_CAPSULE | ORAL | 0 refills | Status: DC
  Filled 2023-07-10: qty 12, 84d supply, fill #0
  Filled 2023-09-25: qty 1, 7d supply, fill #1
  Filled 2023-10-12: qty 12, 84d supply, fill #1

## 2023-07-10 MED ORDER — FUROSEMIDE 40 MG PO TABS
40.0000 mg | ORAL_TABLET | Freq: Two times a day (BID) | ORAL | 1 refills | Status: DC
  Filled 2023-07-10: qty 180, 90d supply, fill #0

## 2023-07-10 MED ORDER — POTASSIUM CHLORIDE CRYS ER 10 MEQ PO TBCR
10.0000 meq | EXTENDED_RELEASE_TABLET | Freq: Two times a day (BID) | ORAL | 5 refills | Status: DC
  Filled 2023-07-10: qty 60, 30d supply, fill #0

## 2023-07-10 MED ORDER — BUPROPION HCL ER (SR) 150 MG PO TB12
150.0000 mg | ORAL_TABLET | Freq: Every day | ORAL | 0 refills | Status: DC
  Filled 2023-07-10 – 2023-09-06 (×2): qty 90, 90d supply, fill #0

## 2023-07-10 MED ORDER — METFORMIN HCL 500 MG PO TABS
500.0000 mg | ORAL_TABLET | Freq: Two times a day (BID) | ORAL | 0 refills | Status: DC
  Filled 2023-07-10: qty 60, 30d supply, fill #0

## 2023-07-10 MED ORDER — SEMAGLUTIDE (1 MG/DOSE) 4 MG/3ML ~~LOC~~ SOPN
1.0000 mg | PEN_INJECTOR | SUBCUTANEOUS | 1 refills | Status: DC
  Filled 2023-07-10: qty 3, 28d supply, fill #0

## 2023-07-31 ENCOUNTER — Ambulatory Visit: Attending: Internal Medicine

## 2023-07-31 DIAGNOSIS — Z7901 Long term (current) use of anticoagulants: Secondary | ICD-10-CM | POA: Diagnosis present

## 2023-07-31 LAB — POCT INR: INR: 2.6 (ref 2.0–3.0)

## 2023-07-31 NOTE — Patient Instructions (Signed)
 Description   Continue taking Warfarin 1.5 tablets daily except 2 tablets on Sundays.  Recheck INR 6 weeks.  Cardiac Clearance Fax # [phone] Coumadin Clinic [phone]

## 2023-08-13 NOTE — Progress Notes (Signed)
 SUBJECTIVE: Discussed the use of AI scribe software for clinical note transcription with the patient, who gave verbal consent to proceed.  Chief Complaint: Obesity  Interim History: She is up 4 lbs since her last visit.   Alexa [name] is here to discuss her progress with her obesity treatment plan. She is on the Category 3 Plan and states she is following her eating plan approximately 90 % of the time. She states she is exercising Dancing 60-90 minutes 3-4 times per week.  Alexa [name] is a 48 year old female with obesity who presents for follow-up of her obesity treatment plan.  She has gained four pounds since her last visit and adheres to her category three obesity treatment plan about ninety percent of the time. She attributes the weight gain to increased water retention, possibly from consuming salty foods like chips. Her involvement in a play has altered her schedule, leading to later meal times. Despite the busy schedule, she is making efforts to prepare meals in advance, such as salads with [place], to avoid fast food.  She has a history of type 2 diabetes and is currently taking metformin  500 mg twice daily and Ozempic  1 mg weekly. She missed a dose of Ozempic  last week but is otherwise compliant with her medication regimen. No nausea, vomiting, diarrhea, or constipation from the medication. Her blood sugar levels have been mostly in the 90s, with the highest being 102. She has been on the 1 mg dose of Ozempic  since March and is monitoring her blood sugar levels periodically.  She is managing vitamin D  and B12 deficiencies with ergocalciferol  50,000 units once weekly and vitamin B12 500 to 1,000 mcg daily. She continues to take bupropion , which she believes helps with energy and controlling emotional eating. She is also taking Lasix  and potassium, and there is a mention of gabapentin  for pain, which was initially prescribed by her primary doctor for tender feet. She describes her feet as  sensitive, feeling everything she steps on.  In terms of social history, she is currently involved in a [employer] musical, which she finds to be a positive outlet despite being time-consuming. She is also responsible for taking care of her mother, which adds to her busy schedule. She is trying to manage her time effectively to maintain her health and dietary goals.  Fasting labs were obtained The patient was informed we would discuss the lab results at the next visit unless there is a critical issue that needs to be addressed sooner. The patient agreed to keep the next visit at the agreed upon time to discuss these results.   OBJECTIVE: Visit Diagnoses: Problem List Items Addressed This Visit     Vitamin D  deficiency   Relevant Orders   VITAMIN D  25 Hydroxy (Vit-D Deficiency, Fractures)   Bilateral lower extremity edema   Relevant Medications   furosemide  (LASIX ) 40 MG tablet   Type 2 diabetes mellitus with other specified complication (HCC) - Primary   Relevant Medications   metFORMIN  (GLUCOPHAGE ) 500 MG tablet   Semaglutide , 1 MG/DOSE, 4 MG/3ML SOPN   gabapentin  (NEURONTIN ) 300 MG capsule   Other Relevant Orders   CMP14+EGFR   Hemoglobin A1c   Insulin , random   Lipid Panel With LDL/HDL Ratio   B12 deficiency   Relevant Medications   cyanocobalamin  (VITAMIN B12) 500 MCG tablet   Other Relevant Orders   Vitamin B12   CBC with Differential/Platelet   Depression   BMI 60.0-69.9, adult (HCC)   Relevant Medications  metFORMIN  (GLUCOPHAGE ) 500 MG tablet   Semaglutide , 1 MG/DOSE, 4 MG/3ML SOPN   Obesity, Beginning BMI 74.50   Relevant Medications   metFORMIN  (GLUCOPHAGE ) 500 MG tablet   Semaglutide , 1 MG/DOSE, 4 MG/3ML SOPN   Neuropathy involving both lower extremities   Relevant Medications   gabapentin  (NEURONTIN ) 300 MG capsule   Decreased potassium in the blood   Relevant Medications   potassium chloride  (KLOR-CON  M) 10 MEQ tablet   Other Visit Diagnoses        Essential hypertension       Relevant Medications   furosemide  (LASIX ) 40 MG tablet     Obesity Weight increased by 4 pounds since last visit. Adheres to a category three plan 90% of the time. Participation in a play may affect eating schedule and stress levels. Discussed the importance of meal prepping and healthy eating habits. Encouraged to consider [employer] meal prep service for convenience. - Encourage meal prepping and healthy eating habits. - Discuss [employer] meal prep service as an option for convenience. - Encourage continued participation in physical activities such as dancing in the play.  Type 2 diabetes mellitus On metformin  500 mg BID and Ozempic  1 mg weekly. Blood glucose levels are stable, with occasional fasting levels in the 90s and a high of 102. Missed one dose of Ozempic  last week. Discussed the importance of consistent dosing to prevent increased cravings and higher insulin  requirements. Denies mass in neck, dysphagia, dyspepsia, persistent hoarseness, abdominal pain, or N/V/Constipation or diarrhea. Has annual eye exam. Mood is stable.  No GI or other side effects with metformin .  Lab Results  Component Value Date   HGBA1C 5.8 (H) 04/08/2023   HGBA1C 5.7 (H) 09/06/2022   HGBA1C 5.6 04/11/2022   Lab Results  Component Value Date   MICROALBUR 1.4 01/28/2016   LDLCALC 104 (H) 04/08/2023   CREATININE 0.82 04/08/2023   INSULIN   Date Value Ref Range Status  04/08/2023 17.8 2.6 - 24.9 uIU/mL Final  ] - Continue metformin  500 mg BID. - Continue Ozempic  1 mg weekly with emphasis on consistent dosing. Consider increasing dose at next visit.   - Check blood glucose at least three times a week to monitor fasting levels. - Order A1c, insulin  level, and fasting lipids. She is working  on nutrition plan to decrease simple carbohydrates, increase lean proteins and exercise to promote weight loss and improve glycemic control .  Neuropathy in diabetes Reports  tender feet and sensitivity to walking barefoot. Previously prescribed gabapentin  for pain management but has not been taking it recently. Discussed the option to refill gabapentin  prescription. - Refill gabapentin  prescription for neuropathic pain management. Meds ordered this encounter  Medications   metFORMIN  (GLUCOPHAGE ) 500 MG tablet    Sig: Take 1 tablet (500 mg total) by mouth 2 (two) times daily with a meal.    Dispense:  60 tablet    Refill:  0   Semaglutide , 1 MG/DOSE, 4 MG/3ML SOPN    Sig: Inject 1 mg as directed once a week.    Dispense:  3 mL    Refill:  1   potassium chloride  (KLOR-CON  M) 10 MEQ tablet    Sig: Take 1 tablet (10 mEq total) by mouth 2 (two) times daily.    Dispense:  60 tablet    Refill:  5   furosemide  (LASIX ) 40 MG tablet    Sig: Take 1 tablet (40 mg total) by mouth 2 (two) times daily.    Dispense:  180 tablet    Refill:  1   cyanocobalamin  (VITAMIN B12) 500 MCG tablet    Sig: 500 mcg once daily under the tongue   gabapentin  (NEURONTIN ) 300 MG capsule    Sig: Take 1 capsule (300 mg total) by mouth 2 (two) times daily.    Dispense:  60 capsule    Refill:  1    Hypertension Hypertension asymptomatic, reasonably well controlled, and no significant medication side effects noted.  Medication(s): Lasix  40 mg BID   KCL 10 mEq BID For edema and BP control.    BP Readings from Last 3 Encounters:  08/14/23 122/81  07/10/23 123/80  06/10/23 (!) 102/59   Lab Results  Component Value Date   CREATININE 0.82 04/08/2023   CREATININE 0.75 09/06/2022   CREATININE 0.76 04/11/2022   No results found for: "GFR"  Plan: Continue all antihypertensives at current dosages. Continue to work on nutrition plan to promote weight loss and improve BP control.  Recheck labs today.   Emotional eating On bupropion , which may help with energy levels and controlling cravings. Discussed the impact of stress and schedule on eating habits. Encouraged regular meal times  and healthy snack options. - Continue bupropion  as prescribed. - Encourage regular meal times and healthy snack options.  Bilateral lower extremity edema Reports increased water weight, possibly due to recent salty food intake. No significant swelling in rings or ankles. Endorses recent potato chips intake.  - Ensure Lasix  and potassium prescriptions are up to date. Continue to work on nutrition plan to promote weight loss and improve BP control and control edema.   Vitamin D  deficiency Managed with ergocalciferol  50,000 units once weekly. No new issues reported. - Continue ergocalciferol  50,000 units once weekly. - Order vitamin D  level.  Vitamin B12 deficiency Managed with vitamin B12 [phone] mcg daily. No new issues reported. - Continue vitamin B12 [phone] mcg daily. - Order B12 level.  Vitals Temp: 98.5 F (36.9 C) BP: 122/81 Pulse Rate: 77 SpO2: 97 %   Anthropometric Measurements Height: 5\' 4"  (1.626 m) Weight: (!) 379 lb (171.9 kg) BMI (Calculated): 65.02 Weight at Last Visit: 375 lb Weight Lost Since Last Visit: 0 Weight Gained Since Last Visit: 4 lb Starting Weight: 434 lb Total Weight Loss (lbs): 55 lb (24.9 kg) Peak Weight: 480 lb   Body Composition  Body Fat %: 69.8 % Fat Mass (lbs): 264.8 lbs Muscle Mass (lbs): 108.8 lbs Visceral Fat Rating : 32   Other Clinical Data Fasting: Yes Labs: Yes Today's Visit #: 41 Starting Date: 06/22/20     ASSESSMENT AND PLAN:  Diet: Alexa [name] is currently in the action stage of change. As such, her goal is to continue with weight loss efforts. She has agreed to Category 3 Plan.  Exercise: Alexa [name] has been instructed to work up to a goal of 150 minutes of combined cardio and strengthening exercise per week and to try a geriatric exercise plan for weight loss and overall health benefits.   Behavior Modification:  We discussed the following Behavioral Modification Strategies today: increasing lean protein intake,  decreasing simple carbohydrates, increasing vegetables, increase H2O intake, increase high fiber foods, meal planning and cooking strategies, better snacking choices, emotional eating strategies , avoiding temptations, and planning for success. We discussed various medication options to help Alexa [name] with her weight loss efforts and we both agreed to continue current treatment plan and continue to work on nutritional and behavioral strategies to promote weight loss.  .  Return in about  6 weeks (around 09/25/2023).Alexa [name] She was informed of the importance of frequent follow up visits to maximize her success with intensive lifestyle modifications for her multiple health conditions.  Attestation Statements:   Reviewed by clinician on day of visit: allergies, medications, problem list, medical history, surgical history, family history, social history, and previous encounter notes.   Time spent on visit including pre-visit chart review and post-visit care and charting was 32 minutes.    Alexa Combes, PA-C

## 2023-08-14 ENCOUNTER — Ambulatory Visit (INDEPENDENT_AMBULATORY_CARE_PROVIDER_SITE_OTHER): Admitting: Physician Assistant

## 2023-08-14 ENCOUNTER — Other Ambulatory Visit: Payer: Self-pay

## 2023-08-14 ENCOUNTER — Other Ambulatory Visit (INDEPENDENT_AMBULATORY_CARE_PROVIDER_SITE_OTHER): Payer: Self-pay | Admitting: Physician Assistant

## 2023-08-14 ENCOUNTER — Other Ambulatory Visit (HOSPITAL_COMMUNITY): Payer: Self-pay

## 2023-08-14 ENCOUNTER — Encounter (INDEPENDENT_AMBULATORY_CARE_PROVIDER_SITE_OTHER): Payer: Self-pay | Admitting: Physician Assistant

## 2023-08-14 VITALS — BP 122/81 | HR 77 | Temp 98.5°F | Ht 64.0 in | Wt 379.0 lb

## 2023-08-14 DIAGNOSIS — R600 Localized edema: Secondary | ICD-10-CM | POA: Diagnosis not present

## 2023-08-14 DIAGNOSIS — Z6841 Body Mass Index (BMI) 40.0 and over, adult: Secondary | ICD-10-CM

## 2023-08-14 DIAGNOSIS — E876 Hypokalemia: Secondary | ICD-10-CM

## 2023-08-14 DIAGNOSIS — E538 Deficiency of other specified B group vitamins: Secondary | ICD-10-CM | POA: Diagnosis not present

## 2023-08-14 DIAGNOSIS — G5793 Unspecified mononeuropathy of bilateral lower limbs: Secondary | ICD-10-CM

## 2023-08-14 DIAGNOSIS — Z7984 Long term (current) use of oral hypoglycemic drugs: Secondary | ICD-10-CM

## 2023-08-14 DIAGNOSIS — E669 Obesity, unspecified: Secondary | ICD-10-CM

## 2023-08-14 DIAGNOSIS — I10 Essential (primary) hypertension: Secondary | ICD-10-CM

## 2023-08-14 DIAGNOSIS — E1121 Type 2 diabetes mellitus with diabetic nephropathy: Secondary | ICD-10-CM

## 2023-08-14 DIAGNOSIS — F3289 Other specified depressive episodes: Secondary | ICD-10-CM

## 2023-08-14 DIAGNOSIS — Z7985 Long-term (current) use of injectable non-insulin antidiabetic drugs: Secondary | ICD-10-CM

## 2023-08-14 DIAGNOSIS — E559 Vitamin D deficiency, unspecified: Secondary | ICD-10-CM

## 2023-08-14 DIAGNOSIS — E1169 Type 2 diabetes mellitus with other specified complication: Secondary | ICD-10-CM

## 2023-08-14 MED ORDER — SEMAGLUTIDE (1 MG/DOSE) 4 MG/3ML ~~LOC~~ SOPN
1.0000 mg | PEN_INJECTOR | SUBCUTANEOUS | 1 refills | Status: DC
  Filled 2023-08-14: qty 3, 28d supply, fill #0
  Filled 2023-09-06: qty 3, 28d supply, fill #1

## 2023-08-14 MED ORDER — FUROSEMIDE 40 MG PO TABS
40.0000 mg | ORAL_TABLET | Freq: Two times a day (BID) | ORAL | 1 refills | Status: DC
  Filled 2023-08-14: qty 180, 90d supply, fill #0
  Filled 2023-11-12: qty 180, 90d supply, fill #1

## 2023-08-14 MED ORDER — CYANOCOBALAMIN 500 MCG PO TABS: ORAL_TABLET | ORAL | Status: DC

## 2023-08-14 MED ORDER — POTASSIUM CHLORIDE CRYS ER 10 MEQ PO TBCR
10.0000 meq | EXTENDED_RELEASE_TABLET | Freq: Two times a day (BID) | ORAL | 5 refills | Status: DC
  Filled 2023-08-14: qty 60, 30d supply, fill #0
  Filled 2023-09-25: qty 60, 30d supply, fill #1
  Filled 2023-10-22: qty 60, 30d supply, fill #2

## 2023-08-14 MED ORDER — METFORMIN HCL 500 MG PO TABS
500.0000 mg | ORAL_TABLET | Freq: Two times a day (BID) | ORAL | 0 refills | Status: DC
  Filled 2023-08-14: qty 60, 30d supply, fill #0

## 2023-08-14 MED ORDER — GABAPENTIN 300 MG PO CAPS
300.0000 mg | ORAL_CAPSULE | Freq: Two times a day (BID) | ORAL | 1 refills | Status: DC
  Filled 2023-08-14: qty 60, 30d supply, fill #0
  Filled 2023-09-25: qty 60, 30d supply, fill #1

## 2023-08-15 LAB — CMP14+EGFR
ALT: 13 IU/L (ref 0–32)
AST: 20 IU/L (ref 0–40)
Albumin: 4.1 g/dL (ref 3.9–4.9)
Alkaline Phosphatase: 61 IU/L (ref 44–121)
BUN/Creatinine Ratio: 12 (ref 9–23)
BUN: 9 mg/dL (ref 6–24)
Bilirubin Total: 0.4 mg/dL (ref 0.0–1.2)
CO2: 22 mmol/L (ref 20–29)
Calcium: 8.7 mg/dL (ref 8.7–10.2)
Chloride: 101 mmol/L (ref 96–106)
Creatinine, Ser: 0.76 mg/dL (ref 0.57–1.00)
Globulin, Total: 2.9 g/dL (ref 1.5–4.5)
Glucose: 80 mg/dL (ref 70–99)
Potassium: 4.3 mmol/L (ref 3.5–5.2)
Sodium: 138 mmol/L (ref 134–144)
Total Protein: 7 g/dL (ref 6.0–8.5)
eGFR: 98 mL/min/{1.73_m2} (ref >59)

## 2023-08-15 LAB — CBC WITH DIFFERENTIAL/PLATELET
Basophils Absolute: 0 10*3/uL (ref 0.0–0.2)
Basos: 0 %
EOS (ABSOLUTE): 0 10*3/uL (ref 0.0–0.4)
Eos: 0 %
Hematocrit: 33.7 % — ABNORMAL LOW (ref 34.0–46.6)
Hemoglobin: 11.3 g/dL (ref 11.1–15.9)
Immature Grans (Abs): 0 10*3/uL (ref 0.0–0.1)
Immature Granulocytes: 0 %
Lymphocytes Absolute: 1.9 10*3/uL (ref 0.7–3.1)
Lymphs: 34 %
MCH: 30.7 pg (ref 26.6–33.0)
MCHC: 33.5 g/dL (ref 31.5–35.7)
MCV: 92 fL (ref 79–97)
Monocytes Absolute: 0.6 10*3/uL (ref 0.1–0.9)
Monocytes: 10 %
Neutrophils Absolute: 3 10*3/uL (ref 1.4–7.0)
Neutrophils: 56 %
Platelets: 384 10*3/uL (ref 150–450)
RBC: 3.68 x10E6/uL — ABNORMAL LOW (ref 3.77–5.28)
RDW: 17.6 % — ABNORMAL HIGH (ref 11.7–15.4)
WBC: 5.4 10*3/uL (ref 3.4–10.8)

## 2023-08-15 LAB — LIPID PANEL WITH LDL/HDL RATIO
Cholesterol, Total: 157 mg/dL (ref 100–199)
HDL: 47 mg/dL
LDL Chol Calc (NIH): 92 mg/dL (ref 0–99)
LDL/HDL Ratio: 2 ratio (ref 0.0–3.2)
Triglycerides: 99 mg/dL (ref 0–149)
VLDL Cholesterol Cal: 18 mg/dL (ref 5–40)

## 2023-08-15 LAB — HEMOGLOBIN A1C
Est. average glucose Bld gHb Est-mCnc: 120 mg/dL
Hgb A1c MFr Bld: 5.8 % — ABNORMAL HIGH (ref 4.8–5.6)

## 2023-08-15 LAB — VITAMIN B12: Vitamin B-12: 102 pg/mL — ABNORMAL LOW (ref 232–1245)

## 2023-08-15 LAB — INSULIN, RANDOM: INSULIN: 19.8 u[IU]/mL (ref 2.6–24.9)

## 2023-08-15 LAB — VITAMIN D 25 HYDROXY (VIT D DEFICIENCY, FRACTURES): Vit D, 25-Hydroxy: 40.5 ng/mL (ref 30.0–100.0)

## 2023-08-22 LAB — COLOGUARD: COLOGUARD: NEGATIVE

## 2023-09-06 ENCOUNTER — Other Ambulatory Visit (INDEPENDENT_AMBULATORY_CARE_PROVIDER_SITE_OTHER): Payer: Self-pay | Admitting: Physician Assistant

## 2023-09-06 ENCOUNTER — Other Ambulatory Visit: Payer: Self-pay | Admitting: Cardiovascular Disease

## 2023-09-06 DIAGNOSIS — E1169 Type 2 diabetes mellitus with other specified complication: Secondary | ICD-10-CM

## 2023-09-06 DIAGNOSIS — Z7901 Long term (current) use of anticoagulants: Secondary | ICD-10-CM

## 2023-09-06 DIAGNOSIS — I2699 Other pulmonary embolism without acute cor pulmonale: Secondary | ICD-10-CM

## 2023-09-07 ENCOUNTER — Other Ambulatory Visit (HOSPITAL_COMMUNITY): Payer: Self-pay

## 2023-09-09 ENCOUNTER — Other Ambulatory Visit (HOSPITAL_COMMUNITY): Payer: Self-pay

## 2023-09-09 ENCOUNTER — Other Ambulatory Visit: Payer: Self-pay

## 2023-09-09 MED ORDER — WARFARIN SODIUM 7.5 MG PO TABS
ORAL_TABLET | ORAL | 0 refills | Status: DC
  Filled 2023-09-09: qty 100, 90d supply, fill #0

## 2023-09-11 ENCOUNTER — Ambulatory Visit: Attending: Internal Medicine | Admitting: *Deleted

## 2023-09-11 DIAGNOSIS — Z7901 Long term (current) use of anticoagulants: Secondary | ICD-10-CM | POA: Insufficient documentation

## 2023-09-11 DIAGNOSIS — I4891 Unspecified atrial fibrillation: Secondary | ICD-10-CM

## 2023-09-11 DIAGNOSIS — I824Y9 Acute embolism and thrombosis of unspecified deep veins of unspecified proximal lower extremity: Secondary | ICD-10-CM | POA: Insufficient documentation

## 2023-09-11 LAB — POCT INR: INR: 1.5 — AB (ref 2.0–3.0)

## 2023-09-11 NOTE — Patient Instructions (Addendum)
 Description   Since you took extra warfarin last night then tonight take an extra 1/2 tablet of warfarin then continue taking Warfarin 1.5 tablets daily except 2 tablets on Sundays.  Recheck INR 3 weeks (6 weeks).  Cardiac Clearance Fax # [phone] Coumadin  Clinic [phone]

## 2023-09-12 ENCOUNTER — Other Ambulatory Visit (HOSPITAL_COMMUNITY): Payer: Self-pay

## 2023-09-25 ENCOUNTER — Other Ambulatory Visit (INDEPENDENT_AMBULATORY_CARE_PROVIDER_SITE_OTHER): Payer: Self-pay | Admitting: Physician Assistant

## 2023-09-25 ENCOUNTER — Ambulatory Visit: Attending: Cardiovascular Disease | Admitting: Cardiovascular Disease

## 2023-09-25 ENCOUNTER — Other Ambulatory Visit (HOSPITAL_COMMUNITY): Payer: Self-pay

## 2023-09-25 ENCOUNTER — Other Ambulatory Visit: Payer: Self-pay

## 2023-09-25 ENCOUNTER — Encounter: Payer: Self-pay | Admitting: Cardiovascular Disease

## 2023-09-25 VITALS — BP 151/86 | HR 80 | Resp 16 | Ht 64.0 in | Wt 383.6 lb

## 2023-09-25 DIAGNOSIS — E559 Vitamin D deficiency, unspecified: Secondary | ICD-10-CM

## 2023-09-25 DIAGNOSIS — I5189 Other ill-defined heart diseases: Secondary | ICD-10-CM | POA: Insufficient documentation

## 2023-09-25 DIAGNOSIS — Z86711 Personal history of pulmonary embolism: Secondary | ICD-10-CM | POA: Insufficient documentation

## 2023-09-25 DIAGNOSIS — Z6841 Body Mass Index (BMI) 40.0 and over, adult: Secondary | ICD-10-CM | POA: Diagnosis present

## 2023-09-25 DIAGNOSIS — I10 Essential (primary) hypertension: Secondary | ICD-10-CM | POA: Insufficient documentation

## 2023-09-25 DIAGNOSIS — F3289 Other specified depressive episodes: Secondary | ICD-10-CM

## 2023-09-25 DIAGNOSIS — I4891 Unspecified atrial fibrillation: Secondary | ICD-10-CM | POA: Diagnosis not present

## 2023-09-25 DIAGNOSIS — G4733 Obstructive sleep apnea (adult) (pediatric): Secondary | ICD-10-CM | POA: Insufficient documentation

## 2023-09-25 DIAGNOSIS — E1169 Type 2 diabetes mellitus with other specified complication: Secondary | ICD-10-CM

## 2023-09-25 NOTE — Progress Notes (Signed)
 09/25/2023 Alexa [name]   1975/12/11  [id number]  Primary Physician Tanda Bleacher, MD Primary Cardiologist: Dorn JINNY Lesches MD GENI CODY MADEIRA, FSCAI  HPI:  Alexa [name] is a 48 y.o.  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  I last saw her in the office 04/03/2022.[name]  She was hospitalized for acute on chronic right-sided heart failure with volume overload and was diuresed.  She did have a history of submassive pulmonary embolus February 2018 currently on Coumadin  anticoagulation.  Other problems include diabetes and hypertension.  She walks with a cane.  Her edema is about at baseline.   Since I saw her a year and a half ago she continues to do well.  She is still going to the [facility] and her weight is stabilized in the 380 range.  She remains on warfarin for her remote submassive pulmonary embolism.  She denies chest pain or shortness of breath.   Current Meds  Medication Sig   acetaminophen  (TYLENOL ) 500 MG tablet Take 1,000 mg by mouth daily as needed for mild pain.   blood glucose meter kit and supplies KIT Use up to four times daily as directed.   buPROPion  (WELLBUTRIN  SR) 150 MG 12 hr tablet Take 1 tablet (150 mg total) by mouth daily.   cetirizine  (ZYRTEC ) 10 MG tablet Take 1 tablet (10 mg total) by mouth daily. (Patient taking differently: Take 10 mg by mouth as needed.)   cyanocobalamin  (VITAMIN B12) 500 MCG tablet 500 mcg once daily under the tongue   furosemide  (LASIX ) 40 MG tablet Take 1 tablet (40 mg total) by mouth 2 (two) times daily.   gabapentin  (NEURONTIN ) 300 MG capsule Take 1 capsule (300 mg total) by mouth 2 (two) times daily.   glucose blood test strip Use to check blood sugar three times daily as instructed   Lancets (ONETOUCH ULTRASOFT) lancets Test daily Use as instructed dm2   metFORMIN  (GLUCOPHAGE ) 500 MG tablet Take 1 tablet (500 mg total) by mouth 2 (two) times  daily with a meal.   Misc. Devices MISC Bariatric size shower chair with handle 423 lbs, BMI 72  Dx. Pulmonary HTN, DM2 with neuropathy, morbid obesity   potassium chloride  (KLOR-CON  M) 10 MEQ tablet Take 1 tablet (10 mEq total) by mouth 2 (two) times daily.   Semaglutide , 1 MG/DOSE, 4 MG/3ML SOPN Inject 1 mg as directed once a week.   Vitamin D , Ergocalciferol , (DRISDOL ) 1.25 MG (50000 UNIT) CAPS capsule Take 1 capsule (50,000 Units total) by mouth every 7 (seven) days.   warfarin (COUMADIN ) 7.5 MG tablet Take 1 to 1.5 tablets by mouth daily or as directed by Coumadin  clinic     Allergies  Allergen Reactions   Penicillins Hives    Has patient had a PCN reaction causing immediate rash, facial/tongue/throat swelling, SOB or lightheadedness with hypotension: No Has patient had a PCN reaction causing severe rash involving mucus membranes or skin necrosis: No Has patient had a PCN reaction that required hospitalization No Has patient had a PCN reaction occurring within the last 10 years: No If all of the above answers are NO, then may proceed with Cephalosporin use.    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   Ex-husband lives locally.    Social Drivers of Corporate investment banker Strain: Low Risk  (04/25/2023)   Overall Financial Resource Strain (CARDIA)    Difficulty of Paying Living Expenses: Not hard at all  Food Insecurity: No Food Insecurity (04/25/2023)   Hunger Vital Sign    Worried About Running Out of Food in the Last Year: Never true    Ran Out of Food in the Last Year: Never true  Transportation Needs: No Transportation Needs (04/25/2023)   PRAPARE - Administrator, Civil Service (Medical): No    Lack of Transportation (Non-Medical): No  Physical Activity: Insufficiently Active (04/25/2023)   Exercise Vital Sign    Days of Exercise per Week: 3 days    Minutes of Exercise per Session: 30 min  Stress: No Stress Concern Present (04/25/2023)   [employer] of Occupational Health - Occupational Stress Questionnaire    Feeling of Stress : Only a little  Social Connections: Moderately Integrated (04/25/2023)   Social Connection and Isolation Panel    Frequency of Communication with Friends and Family: More than three times a week    Frequency of Social Gatherings with Friends and Family: More than three times a week    Attends Religious Services: More than 4 times per year    Active Member of [employer] or Organizations: Yes    Attends Engineer, structural: More than 4 times per year    Marital Status: Divorced  Intimate Partner Violence: Not At Risk (04/25/2023)   Humiliation, Afraid, Rape, and Kick questionnaire    Fear of Current or Ex-Partner: No    Emotionally Abused: No    Physically Abused: No    Sexually Abused: No     Review of Systems: General: negative for chills, fever, night sweats or weight changes.  Cardiovascular: negative for chest pain, dyspnea on exertion, edema, orthopnea, palpitations, paroxysmal nocturnal dyspnea or shortness of breath Dermatological: negative for rash Respiratory: negative for cough or wheezing Urologic: negative for hematuria Abdominal:  negative for nausea, vomiting, diarrhea, bright red blood per rectum, melena, or hematemesis Neurologic: negative for visual changes, syncope, or dizziness All other systems reviewed and are otherwise negative except as noted above.    Blood pressure (!) 151/86, pulse 80, resp. rate 16, height 5' 4 (1.626 m), weight (!) 383 lb 9.6 oz (174 kg), SpO2 95%.  General appearance: alert and no distress Neck: no adenopathy, no carotid bruit, no JVD, supple, symmetrical, trachea midline, and thyroid  not enlarged, symmetric, no tenderness/mass/nodules Lungs: clear to auscultation bilaterally Heart: regular rate and rhythm, S1, S2 normal, no murmur, click, rub or gallop Extremities: extremities normal, atraumatic, no cyanosis or edema Pulses: 2+ and symmetric Skin: Skin color, texture, turgor normal. No rashes or  lesions Neurologic: Grossly normal  EKG EKG Interpretation Date/Time:  Wednesday September 25 2023 13:55:09 EDT Ventricular Rate:  86 PR Interval:  148 QRS Duration:  84 QT Interval:  398 QTC Calculation: 476 R Axis:   83  Text Interpretation: Normal sinus rhythm Normal ECG When compared with ECG of 26-Jun-2017 15:08, PREVIOUS ECG IS PRESENT Confirmed by Court Carrier [phone]) on 09/25/2023 2:33:47 PM    ASSESSMENT AND PLAN:   OSA (obstructive sleep apnea) History of obstructive sleep apnea by symptoms although she was never able to get an adequate sleep study performed.  Benign essential HTN History of essential hypertension with blood pressure measured today at 151/86.  She says she was under a lot of stress at home today but normally her blood pressure is much better controlled.  She is not on antihypertensive medications.  History of pulmonary embolism History of submassive pulmonary embolism February 2018 on warfarin anticoagulation.  She is followed in our Coumadin  clinic.  Diastolic dysfunction History of normal LV systolic function with diastolic dysfunction on oral  diuretics.  BMI 60.0-69.9, adult (HCC) [facility]  Her weight is been fluctuating around the 380 pound range.     Carrier DOROTHA Court MD FACP,FACC,FAHA, [facility] 09/25/2023 2:41 PM

## 2023-09-25 NOTE — Assessment & Plan Note (Signed)
 History of essential hypertension with blood pressure measured today at 151/86.  She says she was under a lot of stress at home today but normally her blood pressure is much better controlled.  She is not on antihypertensive medications.

## 2023-09-25 NOTE — Assessment & Plan Note (Signed)
 History of normal LV systolic function with diastolic dysfunction on oral diuretics.

## 2023-09-25 NOTE — Assessment & Plan Note (Signed)
 History of submassive pulmonary embolism February 2018 on warfarin anticoagulation.  She is followed in our Coumadin  clinic.

## 2023-09-25 NOTE — Assessment & Plan Note (Signed)
 History of obstructive sleep apnea by symptoms although she was never able to get an adequate sleep study performed.

## 2023-09-25 NOTE — Patient Instructions (Signed)
 Medication Instructions:  The current medical regimen is effective;  continue present plan and medications.  *If you need a refill on your cardiac medications before your next appointment, please call your pharmacy*  Follow-Up: At [facility], you and your health needs are our priority.  As part of our continuing mission to provide you with exceptional heart care, our providers are all part of one team.  This team includes your primary Cardiologist (physician) and Advanced Practice Providers or APPs (Physician Assistants and Nurse Practitioners) who all work together to provide you with the care you need, when you need it.  Your next appointment:   1 year(s)  Provider:   One of our Advanced Practice Providers (APPs): Morse Clause, PA-C  Lamarr Satterfield, NP Miriam Shams, NP  Olivia Pavy, PA-C Josefa Beauvais, NP  Leontine Salen, PA-C Orren Fabry, PA-C  [place], PA-C Ernest Dick, NP  Damien Braver, NP Jon Hails, PA-C  Waddell Donath, PA-C    Dayna Dunn, PA-C  Scott Weaver, PA-C Lum Louis, NP Katlyn West, NP Callie Goodrich, PA-C  Evan Williams, PA-C Sheng Haley, PA-C  Xika Zhao, NP Kathleen Johnson, PA-C   Then, Dorn Lesches, MD will plan to see you again in 2 year(s).    We recommend signing up for the patient portal called MyChart.  Sign up information is provided on this After Visit Summary.  MyChart is used to connect with patients for Virtual Visits (Telemedicine).  Patients are able to view lab/test results, encounter notes, upcoming appointments, etc.  Non-urgent messages can be sent to your provider as well.   To learn more about what you can do with MyChart, go to [email or web address].

## 2023-09-25 NOTE — Assessment & Plan Note (Signed)
 History of morbid [facility].  Her weight is been fluctuating around the 380 pound range.

## 2023-09-30 NOTE — Progress Notes (Unsigned)
 SUBJECTIVE: Discussed the use of AI scribe software for clinical note transcription with the patient, who gave verbal consent to proceed.  Chief Complaint: Obesity  Interim History: She is down 1 lb since last visit Down 56 lbs overall TBW loss of 12.9%  Alexa [name] is here to discuss her progress with her obesity treatment plan. She is on the Category 3 Plan and states she is following her eating plan approximately 80 % of the time. She states she is exercising walking/dancing 120 minutes 4 times per week. Alexa [name] is a 48 year old female with obesity and type 2 diabetes who presents for follow-up of her obesity treatment plan.  She has been on a weight loss journey since June 22, 2020, starting at 434 pounds and is now down to 378 pounds, marking a total weight loss of 56 pounds . She did not pursue weight loss surgery and is proud of her progress through lifestyle changes. She engages in walking and dancing for 120 minutes, four times a week, and focuses on meal planning and preparation, including making smoothies and portioning snacks.  She has a history of type 2 diabetes, with her A1c currently at 5.8. She is on Ozempic  1 mg weekly and metformin  500 mg twice a day, with no side effects such as nausea, vomiting, constipation, diarrhea, or changes in vision or mood. Her insulin  level is 15.8, which is slightly elevated but improved.  She experiences lower extremity neuropathy and is on gabapentin  for management. She also has bilateral lower extremity edema. Her vitamin D  and B12 levels have been low, and she is on B12 500 mcg daily. No heavy periods currently, but in the past, she experienced prolonged heavy bleeding when she weighed almost 500 pounds.  Her current medications include bupropion  SR 150 mg daily and warfarin, which she continues to take under the guidance of her heart care provider. She reports no issues with her heart care and INR is at 1.5.  She is actively involved  in cultural activities, recently participating in a musical and preparing for a Son[employer] Fest.   LABS 08/14/23- REVIEWED today  Pharmacotherapy: Ozempic  1 mg weekly    Metformin  500 mg BID OBJECTIVE: Visit Diagnoses: Problem List Items Addressed This Visit     Vitamin D  deficiency   Bilateral lower extremity edema   Type 2 diabetes mellitus with other specified complication (HCC) - Primary   Relevant Medications   Semaglutide , 1 MG/DOSE, 4 MG/3ML SOPN   B12 deficiency   Relevant Medications   Cyanocobalamin  (VITAMIN B 12) 500 MCG TABS   Depression   BMI 60.0-69.9, adult (HCC)   Relevant Medications   Semaglutide , 1 MG/DOSE, 4 MG/3ML SOPN   Obesity, Beginning BMI 74.50   Relevant Medications   Semaglutide , 1 MG/DOSE, 4 MG/3ML SOPN   Neuropathy involving both lower extremities  Obesity She has lost 56 pounds, totaling nearly 100 pounds from her peak weight of 470 lbs.  She is on Ozempic  1 mg weekly, metformin  500 mg twice daily, and bupropion  SR 150 mg daily. She engages in regular physical activity, including walking and dancing for 120 minutes four times a week. Emphasis is placed on slow and steady weight loss and her preference to avoid bariatric surgery. She is encouraged to continue meal planning and preparation to support her weight loss goals. - Continue Ozempic  1 mg weekly - Continue metformin  500 mg twice daily - Continue bupropion  SR 150 mg daily - Encourage regular physical activity - Encourage  meal planning and preparation- Discussed [email or web address].com as resource for recipes with calorie goal of 1500-1600 and protein goal of 100 grams or more daily.   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 Discussion included the potential for diabetes remission with further A1c reduction, but emphasized the importance of continuing medication to maintain coverage. She reports no side effects from Ozempic .Denies  mass in neck, dysphagia, dyspepsia, persistent hoarseness, abdominal pain, or N/V/Constipation or diarrhea. Has annual eye exam. Mood is stable.  Lab Results  Component Value Date   HGBA1C 5.8 (H) 08/14/2023   HGBA1C 5.8 (H) 04/08/2023   HGBA1C 5.7 (H) 09/06/2022   Lab Results  Component Value Date   MICROALBUR 1.4 01/28/2016   LDLCALC 92 08/14/2023   CREATININE 0.76 08/14/2023   Lab Results  Component Value Date   NA 138 08/14/2023   CL 101 08/14/2023   K 4.3 08/14/2023   CO2 22 08/14/2023   BUN 9 08/14/2023   CREATININE 0.76 08/14/2023   EGFR 98 08/14/2023   CALCIUM  8.7 08/14/2023   PHOS 4.3 06/14/2017   ALBUMIN 4.1 08/14/2023   GLUCOSE 80 08/14/2023   She is working  on nutrition plan to decrease simple carbohydrates, increase lean proteins and exercise to promote weight loss and improve glycemic control . - Continue /refill ozempic  and continue metformin  Meds ordered this encounter  Medications   Cyanocobalamin  (VITAMIN B 12) 500 MCG TABS    Sig: Take 2 tablets (1,000 mcg) by mouth daily.    Dispense:  30 tablet    Refill:  0   Semaglutide , 1 MG/DOSE, 4 MG/3ML SOPN    Sig: Inject 1 mg as directed once a week.    Dispense:  3 mL    Refill:  1     Hyperlipidemia Her LDL cholesterol is 111, higher than desired for a diabetic patient. Discussion included the potential need for a statin if LDL does not decrease. The target LDL for diabetic patients is less than 55. Lab Results  Component Value Date   CHOL 157 08/14/2023   CHOL 174 04/08/2023   CHOL 139 09/06/2022   Lab Results  Component Value Date   HDL 47 08/14/2023   HDL 48 04/08/2023   HDL 41 09/06/2022   Lab Results  Component Value Date   LDLCALC 92 08/14/2023   LDLCALC 104 (H) 04/08/2023   LDLCALC 80 09/06/2022   Lab Results  Component Value Date   TRIG 99 08/14/2023   TRIG 124 04/08/2023   TRIG 95 09/06/2022   Lab Results  Component Value Date   CHOLHDL 3.8 11/06/2021   CHOLHDL 3.9  03/02/2020   CHOLHDL 2.7 02/19/2018   Lab Results  Component Value Date   LDLDIRECT 100 10/08/2015   Continue to work on nutrition plan -decreasing simple carbohydrates, increasing lean proteins, decreasing saturated fats and cholesterol , avoiding trans fats and exercise as able to promote weight loss, improve lipids and decrease cardiovascular risks. - Recheck cholesterol panel in 3-4 months - Consider starting a statin if LDL remains elevated  Vitamin D  Deficiency Vitamin D  is not at goal of 50.  Most recent vitamin D  level was 40.5. She is on  prescription ergocalciferol  50,000 IU weekly. No N/V or muscle weakness with Ergocalciferol .  Lab Results  Component Value Date   VD25OH 40.5 08/14/2023   VD25OH 36.1 04/08/2023   VD25OH 33.8 09/06/2022    Plan: Continue  prescription ergocalciferol  50,000 IU weekly  Low vitamin D  levels can be associated with adiposity and may result in leptin resistance and weight gain. Also associated with fatigue.  Currently on vitamin D  supplementation without any adverse effects such as nausea, vomiting or muscle weakness.   Vitamin B12 Deficiency She is on B12 500 mcg daily, but levels remain low. Increasing the dosage to 1000 mcg daily is recommended to help with energy levels and support weight loss. - Increase B12 supplementation to 1000 mcg daily  General Health Maintenance She is encouraged to continue regular physical activity and meal planning. Resources for healthy meal options were provided, and the benefits of using an iron  skillet to increase dietary iron  intake were discussed. - Encourage continued physical activity - Encourage meal planning and preparation - Use an iron  skillet for cooking to increase dietary iron  intake  Follow-up She attends follow-up appointments approximately every 8 weeks. The next appointment is scheduled for November 27, 2023, at 10:30 AM. - Schedule follow-up appointment for November 27, 2023, at 10:30  AM  Vitals Temp: 98.1 F (36.7 C) BP: 129/84 Pulse Rate: (!) 109 SpO2: 100 %   Anthropometric Measurements Height: 5' 4 (1.626 m) Weight: (!) 378 lb (171.5 kg) BMI (Calculated): 64.85 Weight at Last Visit: 379 lb Weight Lost Since Last Visit: 1 lb Weight Gained Since Last Visit: 0 Starting Weight: 434 lb Total Weight Loss (lbs): 56 lb (25.4 kg)   Body Composition  Body Fat %: 70 % Fat Mass (lbs): 265.2 lbs Muscle Mass (lbs): 107.8 lbs Visceral Fat Rating : 32   Other Clinical Data Fasting: No Labs: No Today's Visit #: 42 Starting Date: 06/22/20     ASSESSMENT AND PLAN:  Diet: Julianah is currently in the action stage of change. As such, her goal is to continue with weight loss efforts. She has agreed to Category 3 Plan.  Exercise: Odile has been instructed to continue exercising as is for weight loss and overall health benefits.   Behavior Modification:  We discussed the following Behavioral Modification Strategies today: increasing lean protein intake, decreasing simple carbohydrates, increasing vegetables, increase H2O intake, increase high fiber foods, no skipping meals, meal planning and cooking strategies, avoiding temptations, and planning for success. We discussed various medication options to help Makyna with her weight loss efforts and we both agreed to continue current treatment plan.  Return in about 8 weeks (around 11/26/2023).[name] She was informed of the importance of frequent follow up visits to maximize her success with intensive lifestyle modifications for her multiple health conditions.  Attestation Statements:   Reviewed by clinician on day of visit: allergies, medications, problem list, medical history, surgical history, family history, social history, and previous encounter notes.   Time spent on visit including pre-visit chart review and post-visit care and charting was 45 minutes.    Arael Piccione, PA-C

## 2023-10-01 ENCOUNTER — Other Ambulatory Visit (HOSPITAL_COMMUNITY): Payer: Self-pay

## 2023-10-01 ENCOUNTER — Encounter (INDEPENDENT_AMBULATORY_CARE_PROVIDER_SITE_OTHER): Payer: Self-pay | Admitting: Physician Assistant

## 2023-10-01 ENCOUNTER — Other Ambulatory Visit: Payer: Self-pay

## 2023-10-01 ENCOUNTER — Ambulatory Visit (INDEPENDENT_AMBULATORY_CARE_PROVIDER_SITE_OTHER): Admitting: Physician Assistant

## 2023-10-01 VITALS — BP 129/84 | HR 109 | Temp 98.1°F | Ht 64.0 in | Wt 378.0 lb

## 2023-10-01 DIAGNOSIS — E1169 Type 2 diabetes mellitus with other specified complication: Secondary | ICD-10-CM | POA: Diagnosis not present

## 2023-10-01 DIAGNOSIS — E559 Vitamin D deficiency, unspecified: Secondary | ICD-10-CM | POA: Diagnosis not present

## 2023-10-01 DIAGNOSIS — E538 Deficiency of other specified B group vitamins: Secondary | ICD-10-CM

## 2023-10-01 DIAGNOSIS — G5793 Unspecified mononeuropathy of bilateral lower limbs: Secondary | ICD-10-CM | POA: Diagnosis not present

## 2023-10-01 DIAGNOSIS — F3289 Other specified depressive episodes: Secondary | ICD-10-CM

## 2023-10-01 DIAGNOSIS — Z7985 Long-term (current) use of injectable non-insulin antidiabetic drugs: Secondary | ICD-10-CM

## 2023-10-01 DIAGNOSIS — F32A Depression, unspecified: Secondary | ICD-10-CM

## 2023-10-01 DIAGNOSIS — R600 Localized edema: Secondary | ICD-10-CM

## 2023-10-01 DIAGNOSIS — Z6841 Body Mass Index (BMI) 40.0 and over, adult: Secondary | ICD-10-CM

## 2023-10-01 DIAGNOSIS — E669 Obesity, unspecified: Secondary | ICD-10-CM

## 2023-10-01 MED ORDER — VITAMIN B 12 500 MCG PO TABS
1000.0000 ug | ORAL_TABLET | Freq: Every day | ORAL | 0 refills | Status: DC
  Filled 2023-10-01: qty 30, 30d supply, fill #0

## 2023-10-01 MED ORDER — SEMAGLUTIDE (1 MG/DOSE) 4 MG/3ML ~~LOC~~ SOPN
1.0000 mg | PEN_INJECTOR | SUBCUTANEOUS | 1 refills | Status: DC
  Filled 2023-10-01: qty 3, 28d supply, fill #0

## 2023-10-02 ENCOUNTER — Ambulatory Visit: Attending: Internal Medicine

## 2023-10-02 ENCOUNTER — Other Ambulatory Visit: Payer: Self-pay

## 2023-10-02 DIAGNOSIS — Z7901 Long term (current) use of anticoagulants: Secondary | ICD-10-CM | POA: Insufficient documentation

## 2023-10-02 LAB — POCT INR: INR: 2.7 (ref 2.0–3.0)

## 2023-10-02 NOTE — Patient Instructions (Signed)
 Description   Continue taking Warfarin 1.5 tablets daily except 2 tablets on Sundays.  Recheck INR 4 weeks.  Cardiac Clearance Fax # [phone] Coumadin  Clinic [phone]

## 2023-10-02 NOTE — Progress Notes (Signed)
Please see anticoagulation encounter.

## 2023-10-04 ENCOUNTER — Telehealth: Payer: Self-pay

## 2023-10-04 NOTE — Telephone Encounter (Signed)
 Copied from CRM [id number]. Topic: Referral - Request for Referral >> Oct 04, 2023  8:44 AM Mia F wrote: Did the patient discuss referral with their provider in the last year? Yes (If No - schedule appointment) (If Yes - send message)  Appointment offered? No  Type of order/referral and detailed reason for visit: Mammogram  Preference of office, provider, location: [facility] (want to know if in network with her insurance)  If referral order, have you been seen by this specialty before? Yes (If Yes, this issue or another issue? When? Where?  Can we respond through MyChart? Yes

## 2023-10-11 ENCOUNTER — Other Ambulatory Visit (HOSPITAL_COMMUNITY): Payer: Self-pay

## 2023-10-12 ENCOUNTER — Other Ambulatory Visit (INDEPENDENT_AMBULATORY_CARE_PROVIDER_SITE_OTHER): Payer: Self-pay | Admitting: Physician Assistant

## 2023-10-12 DIAGNOSIS — E1169 Type 2 diabetes mellitus with other specified complication: Secondary | ICD-10-CM

## 2023-10-13 ENCOUNTER — Other Ambulatory Visit (INDEPENDENT_AMBULATORY_CARE_PROVIDER_SITE_OTHER): Payer: Self-pay | Admitting: Physician Assistant

## 2023-10-13 DIAGNOSIS — E538 Deficiency of other specified B group vitamins: Secondary | ICD-10-CM

## 2023-10-14 ENCOUNTER — Other Ambulatory Visit (INDEPENDENT_AMBULATORY_CARE_PROVIDER_SITE_OTHER): Payer: Self-pay | Admitting: Physician Assistant

## 2023-10-14 ENCOUNTER — Other Ambulatory Visit (HOSPITAL_COMMUNITY): Payer: Self-pay

## 2023-10-14 DIAGNOSIS — E559 Vitamin D deficiency, unspecified: Secondary | ICD-10-CM

## 2023-10-16 ENCOUNTER — Other Ambulatory Visit: Payer: Self-pay

## 2023-10-16 ENCOUNTER — Telehealth (INDEPENDENT_AMBULATORY_CARE_PROVIDER_SITE_OTHER): Payer: Self-pay | Admitting: Physician Assistant

## 2023-10-16 ENCOUNTER — Other Ambulatory Visit (HOSPITAL_COMMUNITY): Payer: Self-pay

## 2023-10-16 DIAGNOSIS — E1169 Type 2 diabetes mellitus with other specified complication: Secondary | ICD-10-CM

## 2023-10-16 DIAGNOSIS — E559 Vitamin D deficiency, unspecified: Secondary | ICD-10-CM

## 2023-10-16 MED ORDER — VITAMIN D (ERGOCALCIFEROL) 1.25 MG (50000 UNIT) PO CAPS
50000.0000 [IU] | ORAL_CAPSULE | ORAL | 1 refills | Status: DC
  Filled 2023-10-16: qty 4, 28d supply, fill #0
  Filled 2023-11-12: qty 4, 28d supply, fill #1

## 2023-10-16 MED ORDER — METFORMIN HCL 500 MG PO TABS
500.0000 mg | ORAL_TABLET | Freq: Two times a day (BID) | ORAL | 1 refills | Status: DC
  Filled 2023-10-16: qty 60, 30d supply, fill #0
  Filled 2023-11-12: qty 60, 30d supply, fill #1

## 2023-10-16 NOTE — Telephone Encounter (Signed)
 Sent patient a Wellsite geologist.

## 2023-10-16 NOTE — Telephone Encounter (Signed)
 Pt called in stating that the pharmacy told her that her Vitamin D  and Metformin  has been denied so they cannot refill it. Please follow up with the patient.

## 2023-10-17 ENCOUNTER — Other Ambulatory Visit: Payer: Self-pay

## 2023-10-22 ENCOUNTER — Other Ambulatory Visit: Payer: Self-pay

## 2023-10-22 ENCOUNTER — Other Ambulatory Visit (INDEPENDENT_AMBULATORY_CARE_PROVIDER_SITE_OTHER): Payer: Self-pay | Admitting: Physician Assistant

## 2023-10-22 ENCOUNTER — Other Ambulatory Visit (HOSPITAL_COMMUNITY): Payer: Self-pay

## 2023-10-22 DIAGNOSIS — E1169 Type 2 diabetes mellitus with other specified complication: Secondary | ICD-10-CM

## 2023-10-22 DIAGNOSIS — E538 Deficiency of other specified B group vitamins: Secondary | ICD-10-CM

## 2023-10-22 DIAGNOSIS — G5793 Unspecified mononeuropathy of bilateral lower limbs: Secondary | ICD-10-CM

## 2023-10-26 ENCOUNTER — Other Ambulatory Visit (INDEPENDENT_AMBULATORY_CARE_PROVIDER_SITE_OTHER): Payer: Self-pay | Admitting: Physician Assistant

## 2023-10-26 DIAGNOSIS — G5793 Unspecified mononeuropathy of bilateral lower limbs: Secondary | ICD-10-CM

## 2023-10-26 DIAGNOSIS — E1169 Type 2 diabetes mellitus with other specified complication: Secondary | ICD-10-CM

## 2023-10-28 ENCOUNTER — Other Ambulatory Visit (HOSPITAL_COMMUNITY): Payer: Self-pay

## 2023-10-28 ENCOUNTER — Ambulatory Visit: Payer: Medicaid Other | Admitting: Family Medicine

## 2023-10-28 ENCOUNTER — Other Ambulatory Visit: Payer: Self-pay

## 2023-10-28 ENCOUNTER — Encounter: Payer: Self-pay | Admitting: Family Medicine

## 2023-10-28 VITALS — BP 157/87 | HR 83 | Ht 64.0 in | Wt 387.8 lb

## 2023-10-28 DIAGNOSIS — E1169 Type 2 diabetes mellitus with other specified complication: Secondary | ICD-10-CM

## 2023-10-28 DIAGNOSIS — Z7985 Long-term (current) use of injectable non-insulin antidiabetic drugs: Secondary | ICD-10-CM | POA: Diagnosis not present

## 2023-10-28 DIAGNOSIS — I10 Essential (primary) hypertension: Secondary | ICD-10-CM

## 2023-10-28 DIAGNOSIS — Z7984 Long term (current) use of oral hypoglycemic drugs: Secondary | ICD-10-CM | POA: Diagnosis not present

## 2023-10-28 DIAGNOSIS — Z6841 Body Mass Index (BMI) 40.0 and over, adult: Secondary | ICD-10-CM

## 2023-10-28 DIAGNOSIS — L500 Allergic urticaria: Secondary | ICD-10-CM

## 2023-10-28 MED ORDER — CETIRIZINE HCL 10 MG PO TABS
10.0000 mg | ORAL_TABLET | Freq: Every day | ORAL | 5 refills | Status: DC
  Filled 2023-10-28: qty 30, 30d supply, fill #0
  Filled 2023-12-22: qty 30, 30d supply, fill #1

## 2023-10-28 NOTE — Progress Notes (Unsigned)
 Established Patient Office Visit  Subjective    Patient ID: Alexa [name], female    DOB: [birth year]  Age: 48 y.o. MRN: [id number]  CC:  Chief Complaint  Patient presents with   Medical Management of Chronic Issues    Pt reports she has been catching hives but is not sure what they are     HPI Alexa [name] presents ***  Outpatient Encounter Medications as of 10/28/2023  Medication Sig   acetaminophen  (TYLENOL ) 500 MG tablet Take 1,000 mg by mouth daily as needed for mild pain.   blood glucose meter kit and supplies KIT Use up to four times daily as directed.   buPROPion  (WELLBUTRIN  SR) 150 MG 12 hr tablet Take 1 tablet (150 mg total) by mouth daily.   cetirizine  (ZYRTEC ) 10 MG tablet Take 1 tablet (10 mg total) by mouth daily. (Patient taking differently: Take 10 mg by mouth as needed.)   Cyanocobalamin  (VITAMIN B 12) 500 MCG TABS Take 2 tablets (1,000 mcg) by mouth daily.   furosemide  (LASIX ) 40 MG tablet Take 1 tablet (40 mg total) by mouth 2 (two) times daily.   gabapentin  (NEURONTIN ) 300 MG capsule Take 1 capsule (300 mg total) by mouth 2 (two) times daily.   glucose blood test strip Use to check blood sugar three times daily as instructed   Lancets (ONETOUCH ULTRASOFT) lancets Test daily Use as instructed dm2   metFORMIN  (GLUCOPHAGE ) 500 MG tablet Take 1 tablet (500 mg total) by mouth 2 (two) times daily with a meal.   Misc. Devices MISC Bariatric size shower chair with handle 423 lbs, BMI 72  Dx. Pulmonary HTN, DM2 with neuropathy, morbid obesity   potassium chloride  (KLOR-CON  M) 10 MEQ tablet Take 1 tablet (10 mEq total) by mouth 2 (two) times daily.   Semaglutide , 1 MG/DOSE, 4 MG/3ML SOPN Inject 1 mg as directed once a week.   Vitamin D , Ergocalciferol , (DRISDOL ) 1.25 MG (50000 UNIT) CAPS capsule Take 1 capsule (50,000 Units total) by mouth every 7 (seven) days.   warfarin (COUMADIN ) 7.5 MG tablet Take 1 to 1.5 tablets by mouth daily or as directed by Coumadin   clinic   No facility-administered encounter medications on file as of 10/28/2023.    Past Medical History:  Diagnosis Date   Anemia    Back pain    Bilateral swelling of feet    CHF (congestive heart failure) (HCC)    Diabetes mellitus without complication (HCC)    Phreesia 03/02/2020   DM2 (diabetes mellitus, type 2) (HCC)    Hypertension    Joint pain    Nocturnal hypoxia    Obstructive sleep apnea syndrome, moderate    Pulmonary embolism (HCC) 05/14/2016   submassive, on chronic anticoagulation   Pulmonary hypertension (HCC)    Vitamin D  deficiency     Past Surgical History:  Procedure Laterality Date   CESAREAN SECTION      Family History  Problem Relation Age of Onset   Diabetes Maternal Grandmother    Breast cancer Maternal Grandmother        breast cancer    Brain cancer Maternal Grandmother    Hypertension Mother    Obesity Mother    Heart disease Father    Obesity Father    Breast cancer Maternal Aunt 77   Breast cancer Maternal Aunt 15    Social History   Socioeconomic History   Marital status: Divorced    Spouse name: n/a   Number of children: 2  Years of education: Not on file   Highest education level: Not on file  Occupational History   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   Ex-husband lives locally.   Social Drivers of Corporate investment banker Strain: Low Risk  (04/25/2023)   Overall Financial  Resource Strain (CARDIA)    Difficulty of Paying Living Expenses: Not hard at all  Food Insecurity: No Food Insecurity (04/25/2023)   Hunger Vital Sign    Worried About Running Out of Food in the Last Year: Never true    Ran Out of Food in the Last Year: Never true  Transportation Needs: No Transportation Needs (04/25/2023)   PRAPARE - Administrator, Civil Service (Medical): No    Lack of Transportation (Non-Medical): No  Physical Activity: Insufficiently Active (04/25/2023)   Exercise Vital Sign    Days of Exercise per Week: 3 days    Minutes of Exercise per Session: 30 min  Stress: No Stress Concern Present (04/25/2023)   [employer] of Occupational Health - Occupational Stress Questionnaire    Feeling of Stress : Only a little  Social Connections: Moderately Integrated (04/25/2023)   Social Connection and Isolation Panel    Frequency of Communication with Friends and Family: More than three times a week    Frequency of Social Gatherings with Friends and Family: More than three times a week    Attends Religious Services: More than 4 times per year    Active Member of [employer] or Organizations: Yes    Attends Engineer, structural: More than 4 times per year    Marital Status: Divorced  Catering manager Violence: Not At Risk (04/25/2023)   Humiliation, Afraid, Rape, and Kick questionnaire    Fear of Current or Ex-Partner: No    Emotionally Abused: No    Physically Abused: No    Sexually Abused: No    ROS      Objective    BP (!) 157/87   Pulse 83   Ht 5' 4 (1.626 m)   Wt (!) 387 lb 12.8 oz (175.9 kg)   LMP 10/14/2023 (Approximate)   SpO2 96%   BMI 66.57 kg/m   Physical Exam  {Labs (Optional):23779}    Assessment & Plan:   There are no diagnoses linked to this encounter.   No follow-ups on file.   Tanda Raguel SQUIBB, MD

## 2023-10-28 NOTE — Progress Notes (Unsigned)
 Established Patient Office Visit  Subjective    Patient ID: Alexa [name], female    DOB: Jun 23, 1975  Age: 48 y.o. MRN: [id number]  CC:  Chief Complaint  Patient presents with   Medical Management of Chronic Issues    Pt reports she has been catching hives but is not sure what they are     HPI Alexa [name] presents for routine follow up of chronic med issues including diabetes and hypertension. Patient reports med compliance and denies acute complaints.   Outpatient Encounter Medications as of 10/28/2023  Medication Sig   acetaminophen  (TYLENOL ) 500 MG tablet Take 1,000 mg by mouth daily as needed for mild pain.   blood glucose meter kit and supplies KIT Use up to four times daily as directed.   buPROPion  (WELLBUTRIN  SR) 150 MG 12 hr tablet Take 1 tablet (150 mg total) by mouth daily.   Cyanocobalamin  (VITAMIN B 12) 500 MCG TABS Take 2 tablets (1,000 mcg) by mouth daily.   furosemide  (LASIX ) 40 MG tablet Take 1 tablet (40 mg total) by mouth 2 (two) times daily.   gabapentin  (NEURONTIN ) 300 MG capsule Take 1 capsule (300 mg total) by mouth 2 (two) times daily.   glucose blood test strip Use to check blood sugar three times daily as instructed   Lancets (ONETOUCH ULTRASOFT) lancets Test daily Use as instructed dm2   metFORMIN  (GLUCOPHAGE ) 500 MG tablet Take 1 tablet (500 mg total) by mouth 2 (two) times daily with a meal.   Misc. Devices MISC Bariatric size shower chair with handle 423 lbs, BMI 72  Dx. Pulmonary HTN, DM2 with neuropathy, morbid obesity   potassium chloride  (KLOR-CON  M) 10 MEQ tablet Take 1 tablet (10 mEq total) by mouth 2 (two) times daily.   Semaglutide , 1 MG/DOSE, 4 MG/3ML SOPN Inject 1 mg as directed once a week.   Vitamin D , Ergocalciferol , (DRISDOL ) 1.25 MG (50000 UNIT) CAPS capsule Take 1 capsule (50,000 Units total) by mouth every 7 (seven) days.   warfarin (COUMADIN ) 7.5 MG tablet Take 1 to 1.5 tablets by mouth daily or as directed by Coumadin  clinic    [DISCONTINUED] cetirizine  (ZYRTEC ) 10 MG tablet Take 1 tablet (10 mg total) by mouth daily. (Patient taking differently: Take 10 mg by mouth as needed.)   cetirizine  (ZYRTEC ) 10 MG tablet Take 1 tablet (10 mg total) by mouth daily.   No facility-administered encounter medications on file as of 10/28/2023.    Past Medical History:  Diagnosis Date   Anemia    Back pain    Bilateral swelling of feet    CHF (congestive heart failure) (HCC)    Diabetes mellitus without complication (HCC)    Phreesia 03/02/2020   DM2 (diabetes mellitus, type 2) (HCC)    Hypertension    Joint pain    Nocturnal hypoxia    Obstructive sleep apnea syndrome, moderate    Pulmonary embolism (HCC) 05/14/2016   submassive, on chronic anticoagulation   Pulmonary hypertension (HCC)    Vitamin D  deficiency     Past Surgical History:  Procedure Laterality Date   CESAREAN SECTION      Family History  Problem Relation Age of Onset   Diabetes Maternal Grandmother    Breast cancer Maternal Grandmother        breast cancer    Brain cancer Maternal Grandmother    Hypertension Mother    Obesity Mother    Heart disease Father    Obesity Father    Breast  cancer Maternal Aunt 55   Breast cancer Maternal Aunt 54    Social History   Socioeconomic History   Marital status: Divorced    Spouse name: n/a   Number of children: 2   Years of education: Not on file   Highest education level: Not on file  Occupational History   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   Ex-husband lives locally.   Social Drivers of Corporate investment banker Strain: Low Risk  (04/25/2023)   Overall Financial Resource Strain (CARDIA)    Difficulty of Paying Living Expenses: Not hard at all  Food Insecurity: No Food Insecurity (04/25/2023)   Hunger Vital Sign    Worried About Running Out of Food in the Last Year: Never true    Ran Out of Food in the Last Year: Never true  Transportation Needs: No Transportation Needs (04/25/2023)   PRAPARE - Administrator, Civil Service (Medical): No    Lack of Transportation (Non-Medical): No  Physical Activity: Insufficiently Active (04/25/2023)   Exercise Vital Sign    Days of Exercise per Week: 3 days    Minutes of Exercise per Session: 30 min  Stress: No Stress Concern Present (04/25/2023)   [employer] of Occupational Health - Occupational Stress Questionnaire    Feeling of Stress : Only a little  Social Connections: Moderately Integrated (04/25/2023)   Social Connection and Isolation Panel    Frequency of Communication with Friends and Family: More than three times a week    Frequency of Social Gatherings with Friends and Family: More than three times a week    Attends Religious Services: More than 4 times per year    Active Member of [employer] or Organizations: Yes    Attends Banker Meetings: More than 4 times per year    Marital Status: Divorced  Intimate Partner Violence: Not At Risk (04/25/2023)   Humiliation, Afraid, Rape, and Kick questionnaire    Fear of Current or Ex-Partner: No    Emotionally Abused: No    Physically Abused: No    Sexually Abused: No    Review of Systems  All other systems reviewed and are negative.       Objective    BP (!) 157/87   Pulse 83   Ht 5' 4 (1.626 m)   Wt (!) 387 lb 12.8 oz (175.9 kg)   LMP 10/14/2023 (Approximate)   SpO2 96%   BMI 66.57 kg/m    Physical Exam Vitals and nursing note reviewed.  Constitutional:      General: She is not in acute distress.    Appearance: She is obese.  Cardiovascular:     Rate and Rhythm: Normal rate and regular rhythm.  Pulmonary:     Effort: Pulmonary effort is normal.     Breath  sounds: Normal breath sounds.  Neurological:     General: No focal deficit present.     Mental Status: She is alert and oriented to person, place, and time.         Assessment & Plan:  1. Type 2 diabetes mellitus with other specified complication, unspecified whether long term insulin  use (HCC) (Primary) Most recent A1c stable and at goal.   2. Essential hypertension Slightly elevated readings.   3. Allergic urticaria  - cetirizine  (ZYRTEC ) 10 MG tablet; Take 1 tablet (10 mg total) by mouth daily.  Dispense: 30 tablet; Refill: 5  4. BMI 60.0-69.9, adult (HCC) Current BMI 65.0     Return in about 6 months (around 04/29/2024) for follow up.   Tanda Raguel SQUIBB, MD

## 2023-10-30 ENCOUNTER — Ambulatory Visit: Attending: Internal Medicine | Admitting: *Deleted

## 2023-10-30 DIAGNOSIS — Z7901 Long term (current) use of anticoagulants: Secondary | ICD-10-CM | POA: Diagnosis present

## 2023-10-30 DIAGNOSIS — I4891 Unspecified atrial fibrillation: Secondary | ICD-10-CM | POA: Insufficient documentation

## 2023-10-30 DIAGNOSIS — I824Y9 Acute embolism and thrombosis of unspecified deep veins of unspecified proximal lower extremity: Secondary | ICD-10-CM | POA: Diagnosis present

## 2023-10-30 LAB — POCT INR: INR: 2.4 (ref 2.0–3.0)

## 2023-10-30 NOTE — Progress Notes (Signed)
 INR-2.4 Please see anticoagulation encounter.

## 2023-10-30 NOTE — Patient Instructions (Signed)
 Description   Continue taking Warfarin 1.5 tablets daily except 2 tablets on Sundays. Recheck INR 5 weeks.  Cardiac Clearance Fax # [phone] [facility] [phone]

## 2023-11-07 ENCOUNTER — Other Ambulatory Visit (HOSPITAL_COMMUNITY): Payer: Self-pay

## 2023-11-08 ENCOUNTER — Other Ambulatory Visit (HOSPITAL_COMMUNITY): Payer: Self-pay

## 2023-11-08 ENCOUNTER — Encounter (HOSPITAL_COMMUNITY): Payer: Self-pay

## 2023-11-12 ENCOUNTER — Other Ambulatory Visit: Payer: Self-pay | Admitting: Cardiovascular Disease

## 2023-11-12 ENCOUNTER — Other Ambulatory Visit (INDEPENDENT_AMBULATORY_CARE_PROVIDER_SITE_OTHER): Payer: Self-pay | Admitting: Physician Assistant

## 2023-11-12 DIAGNOSIS — G5793 Unspecified mononeuropathy of bilateral lower limbs: Secondary | ICD-10-CM

## 2023-11-12 DIAGNOSIS — Z7901 Long term (current) use of anticoagulants: Secondary | ICD-10-CM

## 2023-11-12 DIAGNOSIS — E538 Deficiency of other specified B group vitamins: Secondary | ICD-10-CM

## 2023-11-12 DIAGNOSIS — I2699 Other pulmonary embolism without acute cor pulmonale: Secondary | ICD-10-CM

## 2023-11-12 DIAGNOSIS — E1169 Type 2 diabetes mellitus with other specified complication: Secondary | ICD-10-CM

## 2023-11-13 ENCOUNTER — Other Ambulatory Visit (HOSPITAL_COMMUNITY): Payer: Self-pay

## 2023-11-13 ENCOUNTER — Other Ambulatory Visit: Payer: Self-pay

## 2023-11-13 ENCOUNTER — Telehealth: Payer: Self-pay | Admitting: Cardiovascular Disease

## 2023-11-13 DIAGNOSIS — I2699 Other pulmonary embolism without acute cor pulmonale: Secondary | ICD-10-CM

## 2023-11-13 DIAGNOSIS — Z7901 Long term (current) use of anticoagulants: Secondary | ICD-10-CM

## 2023-11-13 MED ORDER — WARFARIN SODIUM 7.5 MG PO TABS
11.2500 mg | ORAL_TABLET | Freq: Every day | ORAL | 0 refills | Status: DC
  Filled 2023-11-13: qty 155, 90d supply, fill #0

## 2023-11-13 MED ORDER — WARFARIN SODIUM 7.5 MG PO TABS
ORAL_TABLET | ORAL | 1 refills | Status: DC
  Filled 2023-11-13: qty 140, 90d supply, fill #0
  Filled ????-??-??: fill #0

## 2023-11-13 NOTE — Telephone Encounter (Signed)
 Prescription refill request received for warfarin Lov: 09/25/23 Junnie)  Next INR check: 12/04/23 Warfarin tablet strength: 7.5mg   Appropriate dose. Refill sent.

## 2023-11-13 NOTE — Telephone Encounter (Signed)
 Warfarin 7.5mg  refill Afib, PE Last INR 10/30/23 Last OV 09/25/23

## 2023-11-13 NOTE — Telephone Encounter (Signed)
 Pt c/o medication issue:  1. Name of Medication:   warfarin (COUMADIN ) 7.5 MG tablet   2. How are you currently taking this medication (dosage and times per day)?   3. Are you having a reaction (difficulty breathing--STAT)?   4. What is your medication issue?   Patient stated she has had a change in the dosage and her pharmacy is still showing 1 to 1.5 tablets.  Patient wants a call back and updated prescription sent to [facility].

## 2023-11-14 ENCOUNTER — Other Ambulatory Visit: Payer: Self-pay

## 2023-11-14 ENCOUNTER — Other Ambulatory Visit (HOSPITAL_COMMUNITY): Payer: Self-pay

## 2023-11-15 ENCOUNTER — Telehealth: Payer: Self-pay | Admitting: Cardiovascular Disease

## 2023-11-15 NOTE — Telephone Encounter (Signed)
 I spoke to patient and advised her to take continue normal dose of coumadin  and avoid greens over the weekend.  She will keep regular f/u in a few weeks.

## 2023-11-15 NOTE — Telephone Encounter (Signed)
 Called patient and patient requested medication dosage education for restarting  Coumadin . Per patient she missed several dosages and pick up prescription and would like information on how to take missed dose. Advised patient this will be forward to our Pharm D team and anticoagulation team. Made patient aware that someone will reach out to her with that informations. Understanding verbalized.

## 2023-11-15 NOTE — Telephone Encounter (Signed)
 Pt c/o medication issue:  1. Name of Medication:   warfarin (COUMADIN ) 7.5 MG tablet    2. How are you currently taking this medication (dosage and times per day)?   Take 1.5-2 tablets (11.25-15 mg total) by mouth daily as directed by Coumadin  Clinic.    3. Are you having a reaction (difficulty breathing--STAT)? No  4. What is your medication issue? Pt was supposed 1.5 tablets daily from 8/18-8/21 but only picked up the prescription today so pt missed the first four days of medication. Pt has taken two tablets today. Pt would like a c/b as to the best course of action to take moving forward.

## 2023-11-18 ENCOUNTER — Other Ambulatory Visit (HOSPITAL_COMMUNITY): Payer: Self-pay

## 2023-11-26 NOTE — Progress Notes (Signed)
 SUBJECTIVE: Discussed the use of AI scribe software for clinical note transcription with the patient, who gave verbal consent to proceed.  Chief Complaint: Obesity  Interim History: She is up 1 lb since her last visit. Down 55 lbs overall TBW loss of 12.7%  Alexa [name] is here to discuss her progress with her obesity treatment plan. She is on the Category 3 Plan and states she is following her eating plan approximately 90 % of the time. She states she is exercising walking/dancing 10-60 minutes 5 times per week.  Alexa [name] is a 48 year old [name] with obesity and type 2 diabetes who presents for follow-up of her obesity treatment plan.  She has been on a small dose of Ozempic  since March but has taken it irregularly, with missing some doses in the past six weeks. She is concerned about weight gain and has started a new job that involves more physical activity. She is incorporating more walking into her routine and is considering increasing her physical activity by attending aerobics classes and swimming at the [facility]. She experiences cravings, particularly for sweets, but manages them by keeping fruit at home.  She has a history of type 2 diabetes and is currently taking metformin  twice a day without any gastrointestinal side effects. Her diabetes management includes Ozempic , which she has been on since March, but she has missed doses recently. No nausea, vomiting, constipation, or diarrhea with Ozempic  use.  She has a history of vitamin D  and B12 deficiencies. No muscle weakness, nausea, or vomiting related to vitamin D  deficiency is reported. She is currently not taking vitamin B12 supplements and needs a refill.  She has a history of neuropathy in her lower extremities and is taking gabapentin , which is helping with her symptoms. She does not experience excessive sleepiness with gabapentin  use.  She is anticoagulated due to a history of atrial fibrillation and has a history of pulmonary  hypertension and thrombosis in her lower extremities. No new symptoms related to these conditions are reported during this visit.  Pharmacotherapy: Ozempic  1 mg weekly                                     Metformin  500 mg BID OBJECTIVE: Visit Diagnoses: Problem List Items Addressed This Visit     Vitamin D  deficiency   Relevant Medications   Vitamin D , Ergocalciferol , (DRISDOL ) 1.25 MG (50000 UNIT) CAPS capsule   Bilateral lower extremity edema   Type 2 diabetes mellitus with other specified complication (HCC) - Primary   Relevant Medications   Semaglutide , 1 MG/DOSE, 4 MG/3ML SOPN   metFORMIN  (GLUCOPHAGE ) 500 MG tablet   gabapentin  (NEURONTIN ) 300 MG capsule   B12 deficiency   Relevant Medications   Cyanocobalamin  (VITAMIN B 12) 500 MCG TABS   Depression   BMI 60.0-69.9, adult (HCC)   Relevant Medications   Semaglutide , 1 MG/DOSE, 4 MG/3ML SOPN   metFORMIN  (GLUCOPHAGE ) 500 MG tablet   Obesity, Beginning BMI 74.50   Relevant Medications   Semaglutide , 1 MG/DOSE, 4 MG/3ML SOPN   metFORMIN  (GLUCOPHAGE ) 500 MG tablet   Neuropathy involving both lower extremities   Relevant Medications   gabapentin  (NEURONTIN ) 300 MG capsule   Decreased potassium in the blood   Relevant Medications   potassium chloride  (KLOR-CON  M) 10 MEQ tablet   Obesity Obesity management with Ozempic  has resulted in an overall weight loss of 55 pounds, despite a  recent slight increase of over a pound. Hunger is generally controlled, but cravings for sweets persist. Missed doses of Ozempic  may affect its efficacy. Mounjaro , containing two drugs, may offer better weight loss outcomes if insurance covers it, as evidenced by a 16-pound loss in six weeks in another patient. - Continue Ozempic  1 mg weekly - Ensure regular weekly dosing of Ozempic , preferably on Sundays - Consider switching to Mounjaro  if insurance covers it, depending on future weight management outcomes - Encourage increased physical activity,  including swimming at the [facility] - Monitor weight and adjust treatment plan as necessary  Type 2 diabetes mellitus Type 2 diabetes is managed with metformin  and Ozempic . Blood glucose control is well-managed with no gastrointestinal side effects from metformin . - Continue metformin  twice daily - Continue Ozempic  1 mg weekly  Vitamin D  deficiency Vitamin D  deficiency is being managed with no reported symptoms such as muscle weakness, nausea, or vomiting. - Continue current management  Vitamin B12 deficiency Vitamin B12 deficiency requires management, but the current status of supplementation is unclear. - Prescribe vitamin B12 supplementation  Emotional eating behavior Emotional eating behavior is present, with cravings for sweets. Strategies such as keeping fruit at home and chewing gum are being used to manage cravings. - Continue using strategies to manage cravings, such as keeping fruit at home and chewing gum  Lower extremity neuropathy Lower extremity neuropathy is managed with gabapentin , with no excessive sleepiness reported as a side effect. - Prescribe gabapentin   Atrial fibrillation on anticoagulation Atrial fibrillation is managed with anticoagulation therapy. - Continue current anticoagulation therapy  Pulmonary hypertension Pulmonary hypertension is part of the medical history. - Continue current management  Follow-Up Follow-up appointment scheduled to assess weight management and medication efficacy. - Schedule follow-up appointment on Wednesday, October 1st at 12:00 PM - Plan for fasting labs at the next visit Vitals Temp: 98.6 F (37 C) BP: 110/63 Pulse Rate: 93 SpO2: 97 %   Anthropometric Measurements Height: 5' 4 (1.626 m) Weight: (!) 379 lb (171.9 kg) BMI (Calculated): 65.02 Weight at Last Visit: 378lb Weight Lost Since Last Visit: 0 Weight Gained Since Last Visit: 1lb Starting Weight: 434lb Total Weight Loss (lbs): 55 lb (24.9 kg)   Body  Composition  Body Fat %: 70.4 % Fat Mass (lbs): 267 lbs Muscle Mass (lbs): 106.6 lbs Visceral Fat Rating : 33   Other Clinical Data Fasting: No Labs: No Today's Visit #: 31 Starting Date: 06/22/20     ASSESSMENT AND PLAN:  Diet: Alexa [name] is currently in the action stage of change. As such, her goal is to continue with weight loss efforts. She has agreed to Category 3 Plan.  Exercise: Alexa [name] has been instructed to work up to a goal of 150 minutes of combined cardio and strengthening exercise per week for weight loss and overall health benefits.   Behavior Modification:  We discussed the following Behavioral Modification Strategies today: increasing lean protein intake, decreasing simple carbohydrates, increasing vegetables, increase H2O intake, increase high fiber foods, no skipping meals, meal planning and cooking strategies, better snacking choices, emotional eating strategies , avoiding temptations, and planning for success. We discussed various medication options to help Alexa [name] with her weight loss efforts and we both agreed to continue current treatment plan.  Return in about 4 weeks (around 12/25/2023) for Fasting Lab.[name] She was informed of the importance of frequent follow up visits to maximize her success with intensive lifestyle modifications for her multiple health conditions.  Attestation Statements:   Reviewed by clinician  on day of visit: allergies, medications, problem list, medical history, surgical history, family history, social history, and previous encounter notes.    I personally spent a total of 28 minutes in the care of the patient today including preparing to see the patient, placing orders in the EMR, documenting clinical information in the EMR, customized nutritional counseling for their specific health and social needs, and discussing results with the patient and educating them on how these results can affect their health and weight.   Dontavia Brand,  PA-C

## 2023-11-27 ENCOUNTER — Ambulatory Visit (INDEPENDENT_AMBULATORY_CARE_PROVIDER_SITE_OTHER): Admitting: Physician Assistant

## 2023-11-27 ENCOUNTER — Other Ambulatory Visit (HOSPITAL_COMMUNITY): Payer: Self-pay

## 2023-11-27 ENCOUNTER — Encounter (INDEPENDENT_AMBULATORY_CARE_PROVIDER_SITE_OTHER): Payer: Self-pay | Admitting: Physician Assistant

## 2023-11-27 ENCOUNTER — Other Ambulatory Visit: Payer: Self-pay

## 2023-11-27 VITALS — BP 110/63 | HR 93 | Temp 98.6°F | Ht 64.0 in | Wt 379.0 lb

## 2023-11-27 DIAGNOSIS — R600 Localized edema: Secondary | ICD-10-CM

## 2023-11-27 DIAGNOSIS — E1169 Type 2 diabetes mellitus with other specified complication: Secondary | ICD-10-CM | POA: Diagnosis not present

## 2023-11-27 DIAGNOSIS — Z7985 Long-term (current) use of injectable non-insulin antidiabetic drugs: Secondary | ICD-10-CM

## 2023-11-27 DIAGNOSIS — E559 Vitamin D deficiency, unspecified: Secondary | ICD-10-CM | POA: Diagnosis not present

## 2023-11-27 DIAGNOSIS — E538 Deficiency of other specified B group vitamins: Secondary | ICD-10-CM

## 2023-11-27 DIAGNOSIS — F32A Depression, unspecified: Secondary | ICD-10-CM

## 2023-11-27 DIAGNOSIS — G5793 Unspecified mononeuropathy of bilateral lower limbs: Secondary | ICD-10-CM

## 2023-11-27 DIAGNOSIS — Z7984 Long term (current) use of oral hypoglycemic drugs: Secondary | ICD-10-CM

## 2023-11-27 DIAGNOSIS — E876 Hypokalemia: Secondary | ICD-10-CM

## 2023-11-27 DIAGNOSIS — E669 Obesity, unspecified: Secondary | ICD-10-CM

## 2023-11-27 DIAGNOSIS — F5089 Other specified eating disorder: Secondary | ICD-10-CM

## 2023-11-27 DIAGNOSIS — Z6841 Body Mass Index (BMI) 40.0 and over, adult: Secondary | ICD-10-CM

## 2023-11-27 DIAGNOSIS — F3289 Other specified depressive episodes: Secondary | ICD-10-CM

## 2023-11-27 MED ORDER — VITAMIN D (ERGOCALCIFEROL) 1.25 MG (50000 UNIT) PO CAPS
50000.0000 [IU] | ORAL_CAPSULE | ORAL | 1 refills | Status: DC
  Filled 2023-11-27 – 2023-12-04 (×2): qty 4, 28d supply, fill #0

## 2023-11-27 MED ORDER — SEMAGLUTIDE (1 MG/DOSE) 4 MG/3ML ~~LOC~~ SOPN
1.0000 mg | PEN_INJECTOR | SUBCUTANEOUS | 1 refills | Status: DC
  Filled 2023-11-27: qty 3, 28d supply, fill #0
  Filled 2023-12-22: qty 3, 28d supply, fill #1

## 2023-11-27 MED ORDER — GABAPENTIN 300 MG PO CAPS
300.0000 mg | ORAL_CAPSULE | Freq: Two times a day (BID) | ORAL | 1 refills | Status: DC
  Filled 2023-11-27: qty 60, 30d supply, fill #0
  Filled 2023-12-22: qty 60, 30d supply, fill #1

## 2023-11-27 MED ORDER — POTASSIUM CHLORIDE CRYS ER 10 MEQ PO TBCR
10.0000 meq | EXTENDED_RELEASE_TABLET | Freq: Two times a day (BID) | ORAL | 5 refills | Status: DC
  Filled 2023-11-27: qty 60, 30d supply, fill #0
  Filled 2023-12-22: qty 60, 30d supply, fill #1

## 2023-11-27 MED ORDER — VITAMIN B 12 500 MCG PO TABS
1000.0000 ug | ORAL_TABLET | Freq: Every day | ORAL | 0 refills | Status: DC
  Filled 2023-11-27: qty 30, 15d supply, fill #0

## 2023-11-27 MED ORDER — METFORMIN HCL 500 MG PO TABS
500.0000 mg | ORAL_TABLET | Freq: Two times a day (BID) | ORAL | 1 refills | Status: DC
Start: 2023-11-27 — End: 2023-12-25
  Filled 2023-11-27 – 2023-12-05 (×2): qty 60, 30d supply, fill #0
  Filled 2023-12-22: qty 60, 30d supply, fill #1
  Filled ????-??-??: fill #1

## 2023-11-28 ENCOUNTER — Other Ambulatory Visit: Payer: Self-pay

## 2023-11-29 ENCOUNTER — Encounter (INDEPENDENT_AMBULATORY_CARE_PROVIDER_SITE_OTHER): Payer: Self-pay | Admitting: Physician Assistant

## 2023-12-04 ENCOUNTER — Ambulatory Visit: Attending: Internal Medicine

## 2023-12-04 ENCOUNTER — Other Ambulatory Visit: Payer: Self-pay

## 2023-12-04 ENCOUNTER — Other Ambulatory Visit (HOSPITAL_COMMUNITY): Payer: Self-pay

## 2023-12-04 ENCOUNTER — Telehealth: Payer: Self-pay | Admitting: *Deleted

## 2023-12-04 NOTE — Telephone Encounter (Signed)
 Called patient since she missed her 1145am Anticoagulation Appt today, there was no answer so left her a message to call back to reschedule.

## 2023-12-05 ENCOUNTER — Other Ambulatory Visit (HOSPITAL_COMMUNITY): Payer: Self-pay

## 2023-12-05 ENCOUNTER — Other Ambulatory Visit: Payer: Self-pay

## 2023-12-16 LAB — HM DIABETES EYE EXAM

## 2023-12-22 ENCOUNTER — Other Ambulatory Visit (INDEPENDENT_AMBULATORY_CARE_PROVIDER_SITE_OTHER): Payer: Self-pay | Admitting: Physician Assistant

## 2023-12-22 DIAGNOSIS — F3289 Other specified depressive episodes: Secondary | ICD-10-CM

## 2023-12-22 DIAGNOSIS — E538 Deficiency of other specified B group vitamins: Secondary | ICD-10-CM

## 2023-12-23 ENCOUNTER — Other Ambulatory Visit: Payer: Self-pay

## 2023-12-23 ENCOUNTER — Other Ambulatory Visit (HOSPITAL_COMMUNITY): Payer: Self-pay

## 2023-12-24 ENCOUNTER — Other Ambulatory Visit (HOSPITAL_COMMUNITY): Payer: Self-pay

## 2023-12-24 ENCOUNTER — Encounter (HOSPITAL_COMMUNITY): Payer: Self-pay

## 2023-12-24 ENCOUNTER — Other Ambulatory Visit: Payer: Self-pay

## 2023-12-24 NOTE — Progress Notes (Signed)
 SUBJECTIVE: Discussed the use of AI scribe software for clinical note transcription with the patient, who gave verbal consent to proceed.  Chief Complaint: Obesity  Interim History: She is up 9 lbs since her last visit.   Alexa [name] is here to discuss her progress with her obesity treatment plan. She is on the Category 3 Plan and states she is following her eating plan approximately 90 % of the time. She states she is exercising dancing 15-60 minutes 3 times per week.  Alexa [name] is a 48 year old female with obesity, type 2 diabetes, and neuropathy who presents for follow-up of her obesity treatment plan.  She reports a slight weight increase, attributing it to not taking her diuretics due to inconvenience at work. Her thighs are puffier than usual, but her ankles are unaffected. She is currently on Ozempic  1 mg with no side effects such as nausea, vomiting, constipation, diarrhea, difficulty swallowing, vision changes, or mood changes.  She sometimes skips meals, often due to late work hours, and tries to eat light snacks like popcorn or fruit. She is unsure if she is meeting her daily caloric and protein intake goals. She experiences emotional eating behaviors and is trying to improve her eating schedule.  She has a history of type 2 diabetes and is currently taking metformin . She is also on gabapentin  for neuropathy in both lower extremities, with no reported side effects.  Her medication regimen includes B12 and vitamin D  supplements, with no side effects reported. She is also taking Lasix  and potassium, ensuring adequate hydration despite concerns about fluid retention.  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  Fasting labs obtained today.  The patient was informed we would discuss the lab results at the next visit unless there is a critical issue that needs to be addressed sooner. The patient agreed to keep the next visit at the  agreed upon time to discuss these results.   OBJECTIVE: Visit Diagnoses: Problem List Items Addressed This Visit     Vitamin D  deficiency   Relevant Medications   Vitamin D , Ergocalciferol , (DRISDOL ) 1.25 MG (50000 UNIT) CAPS capsule   Other Relevant Orders   VITAMIN D  25 Hydroxy (Vit-D Deficiency, Fractures)   Bilateral lower extremity edema   Relevant Medications   furosemide  (LASIX ) 40 MG tablet   Type 2 diabetes mellitus with other specified complication (HCC) - Primary   Relevant Medications   Semaglutide , 2 MG/DOSE, 8 MG/3ML SOPN   metFORMIN  (GLUCOPHAGE ) 500 MG tablet   gabapentin  (NEURONTIN ) 300 MG capsule   Other Relevant Orders   CMP14+EGFR   Hemoglobin A1c   Insulin , random   Lipid Panel With LDL/HDL Ratio   B12 deficiency   Relevant Medications   Cyanocobalamin  (VITAMIN B 12) 500 MCG TABS   Depression   BMI 60.0-69.9, adult (HCC)   Relevant Medications   Semaglutide , 2 MG/DOSE, 8 MG/3ML SOPN   metFORMIN  (GLUCOPHAGE ) 500 MG tablet   Obesity, Beginning BMI 74.50   Relevant Medications   Semaglutide , 2 MG/DOSE, 8 MG/3ML SOPN   metFORMIN  (GLUCOPHAGE ) 500 MG tablet   Neuropathy involving both lower extremities   Relevant Medications   gabapentin  (NEURONTIN ) 300 MG capsule   Other Relevant Orders   Vitamin B12   TSH   Decreased potassium in the blood   Relevant Medications   potassium chloride  (KLOR-CON  M) 10 MEQ tablet  Obesity Obesity with recent increase in adipose tissue, particularly  in the thighs. Currently on Ozempic  1 mg with no reported side effects. Hunger is generally controlled, but there is concern about skipping meals due to decreased appetite. Potential switch to Mounjaro  if Ozempic  at maximum dose is not effective. - Increase Ozempic  to 2 mg and monitor for effectiveness and side effects - Encourage adherence to a regular meal schedule with adequate protein and calorie intake - Encourage regular physical activity, such as setting a schedule for  exercise at the [facility]  Emotional eating behaviors Emotional eating behaviors with a tendency to skip meals and eat late at night. Efforts to improve meal regularity and nutritional intake are ongoing. - Encourage regular meal schedule with focus on protein and calorie intake  Type 2 diabetes mellitus with diabetic polyneuropathy Type 2 diabetes with associated diabetic polyneuropathy. Current medications include metformin  and gabapentin  with no reported side effects from gabapentin . - Continue metformin  and gabapentin  - Refill prescriptions for metformin  and gabapentin  Meds ordered this encounter  Medications   Vitamin D , Ergocalciferol , (DRISDOL ) 1.25 MG (50000 UNIT) CAPS capsule    Sig: Take 1 capsule (50,000 Units total) by mouth every 7 (seven) days.    Dispense:  4 capsule    Refill:  1   Semaglutide , 2 MG/DOSE, 8 MG/3ML SOPN    Sig: Inject 2 mg as directed once a week.    Dispense:  3 mL    Refill:  1   metFORMIN  (GLUCOPHAGE ) 500 MG tablet    Sig: Take 1 tablet (500 mg total) by mouth 2 (two) times daily with a meal.    Dispense:  60 tablet    Refill:  1   gabapentin  (NEURONTIN ) 300 MG capsule    Sig: Take 1 capsule (300 mg total) by mouth 2 (two) times daily.    Dispense:  60 capsule    Refill:  1   potassium chloride  (KLOR-CON  M) 10 MEQ tablet    Sig: Take 1 tablet (10 mEq total) by mouth 2 (two) times daily.    Dispense:  60 tablet    Refill:  5   furosemide  (LASIX ) 40 MG tablet    Sig: Take 1 tablet (40 mg total) by mouth 2 (two) times daily.    Dispense:  180 tablet    Refill:  1   Cyanocobalamin  (VITAMIN B 12) 500 MCG TABS    Sig: Take 2 tablets (1,000 mcg) by mouth daily.    Dispense:  30 tablet    Refill:  0    Vitamin D  deficiency Vitamin D  deficiency managed with supplementation. No reported side effects from vitamin D  supplementation. - Continue vitamin D  supplementation - Refill vitamin D  prescription  Vitamin B12 deficiency Vitamin B12 deficiency  managed with supplementation. No reported side effects from B12 supplementation. - Continue B12 supplementation - Refill B12 prescription  Vitals Temp: 98.5 F (36.9 C) BP: 126/81 Pulse Rate: 82 SpO2: 99 %   Anthropometric Measurements Height: 5' 4 (1.626 m) Weight: (!) 388 lb (176 kg) BMI (Calculated): 66.57 Weight at Last Visit: 379 lb Weight Lost Since Last Visit: 0 Weight Gained Since Last Visit: 9 lb Starting Weight: 434 lb Total Weight Loss (lbs): 46 lb (20.9 kg)   Body Composition  Body Fat %: 73.4 % Fat Mass (lbs): 285.2 lbs Muscle Mass (lbs): 98.2 lbs Visceral Fat Rating : 35   Other Clinical Data Fasting: No Labs: No Today's Visit #: 44 Starting Date: 06/22/20     ASSESSMENT AND PLAN:  Diet: Anja is currently in  the action stage of change. As such, her goal is to continue with weight loss efforts. She has agreed to Category 3 Plan.  Exercise: Aulani has been instructed to work up to a goal of 150 minutes of combined cardio and strengthening exercise per week and to continue exercising as is for weight loss and overall health benefits.   Behavior Modification:  We discussed the following Behavioral Modification Strategies today: increasing lean protein intake, decreasing simple carbohydrates, increasing vegetables, increase H2O intake, increase high fiber foods, no skipping meals, meal planning and cooking strategies, better snacking choices, emotional eating strategies , avoiding temptations, and planning for success. We discussed various medication options to help Duyen with her weight loss efforts and we both agreed to increase Ozempic  to 2 mg weekly for management of Type 2 diabetes and medical weight loss.  Return in about 4 weeks (around 01/22/2024).[name] She was informed of the importance of frequent follow up visits to maximize her success with intensive lifestyle modifications for her multiple health conditions.  Attestation Statements:    Reviewed by clinician on day of visit: allergies, medications, problem list, medical history, surgical history, family history, social history, and previous encounter notes.   Time spent on visit including pre-visit chart review and post-visit care and charting was 20 minutes.    Alexa Missey, PA-C

## 2023-12-25 ENCOUNTER — Other Ambulatory Visit (HOSPITAL_COMMUNITY): Payer: Self-pay

## 2023-12-25 ENCOUNTER — Ambulatory Visit (INDEPENDENT_AMBULATORY_CARE_PROVIDER_SITE_OTHER): Admitting: Physician Assistant

## 2023-12-25 ENCOUNTER — Encounter (INDEPENDENT_AMBULATORY_CARE_PROVIDER_SITE_OTHER): Payer: Self-pay | Admitting: Physician Assistant

## 2023-12-25 ENCOUNTER — Other Ambulatory Visit: Payer: Self-pay

## 2023-12-25 VITALS — BP 126/81 | HR 82 | Temp 98.5°F | Ht 64.0 in | Wt 388.0 lb

## 2023-12-25 DIAGNOSIS — Z7985 Long-term (current) use of injectable non-insulin antidiabetic drugs: Secondary | ICD-10-CM

## 2023-12-25 DIAGNOSIS — E538 Deficiency of other specified B group vitamins: Secondary | ICD-10-CM

## 2023-12-25 DIAGNOSIS — Z7984 Long term (current) use of oral hypoglycemic drugs: Secondary | ICD-10-CM

## 2023-12-25 DIAGNOSIS — E1169 Type 2 diabetes mellitus with other specified complication: Secondary | ICD-10-CM | POA: Diagnosis not present

## 2023-12-25 DIAGNOSIS — E559 Vitamin D deficiency, unspecified: Secondary | ICD-10-CM | POA: Diagnosis not present

## 2023-12-25 DIAGNOSIS — F3289 Other specified depressive episodes: Secondary | ICD-10-CM

## 2023-12-25 DIAGNOSIS — R600 Localized edema: Secondary | ICD-10-CM

## 2023-12-25 DIAGNOSIS — G5793 Unspecified mononeuropathy of bilateral lower limbs: Secondary | ICD-10-CM

## 2023-12-25 DIAGNOSIS — E669 Obesity, unspecified: Secondary | ICD-10-CM

## 2023-12-25 DIAGNOSIS — Z6841 Body Mass Index (BMI) 40.0 and over, adult: Secondary | ICD-10-CM

## 2023-12-25 DIAGNOSIS — F32A Depression, unspecified: Secondary | ICD-10-CM

## 2023-12-25 DIAGNOSIS — E876 Hypokalemia: Secondary | ICD-10-CM

## 2023-12-25 MED ORDER — VITAMIN B 12 500 MCG PO TABS
1000.0000 ug | ORAL_TABLET | Freq: Every day | ORAL | 0 refills | Status: DC
  Filled 2023-12-25: qty 30, 15d supply, fill #0

## 2023-12-25 MED ORDER — FUROSEMIDE 40 MG PO TABS
40.0000 mg | ORAL_TABLET | Freq: Two times a day (BID) | ORAL | 1 refills | Status: DC
  Filled 2023-12-25: qty 180, 90d supply, fill #0

## 2023-12-25 MED ORDER — SEMAGLUTIDE (2 MG/DOSE) 8 MG/3ML ~~LOC~~ SOPN
2.0000 mg | PEN_INJECTOR | SUBCUTANEOUS | 1 refills | Status: DC
  Filled 2023-12-25: qty 3, 28d supply, fill #0

## 2023-12-25 MED ORDER — GABAPENTIN 300 MG PO CAPS
300.0000 mg | ORAL_CAPSULE | Freq: Two times a day (BID) | ORAL | 1 refills | Status: DC
  Filled 2023-12-25: qty 60, 30d supply, fill #0

## 2023-12-25 MED ORDER — VITAMIN D (ERGOCALCIFEROL) 1.25 MG (50000 UNIT) PO CAPS
50000.0000 [IU] | ORAL_CAPSULE | ORAL | 1 refills | Status: DC
  Filled 2023-12-25: qty 4, 28d supply, fill #0

## 2023-12-25 MED ORDER — POTASSIUM CHLORIDE CRYS ER 10 MEQ PO TBCR
10.0000 meq | EXTENDED_RELEASE_TABLET | Freq: Two times a day (BID) | ORAL | 5 refills | Status: DC
  Filled 2023-12-25: qty 60, 30d supply, fill #0

## 2023-12-25 MED ORDER — METFORMIN HCL 500 MG PO TABS
500.0000 mg | ORAL_TABLET | Freq: Two times a day (BID) | ORAL | 1 refills | Status: DC
  Filled 2023-12-25 – 2023-12-27 (×2): qty 60, 30d supply, fill #0

## 2023-12-26 LAB — CMP14+EGFR
ALT: 11 IU/L (ref 0–32)
AST: 18 IU/L (ref 0–40)
Albumin: 4.1 g/dL (ref 3.9–4.9)
Alkaline Phosphatase: 64 IU/L (ref 41–116)
BUN/Creatinine Ratio: 12 (ref 9–23)
BUN: 10 mg/dL (ref 6–24)
Bilirubin Total: 0.3 mg/dL (ref 0.0–1.2)
CO2: 22 mmol/L (ref 20–29)
Calcium: 8.9 mg/dL (ref 8.7–10.2)
Chloride: 102 mmol/L (ref 96–106)
Creatinine, Ser: 0.85 mg/dL (ref 0.57–1.00)
Globulin, Total: 3.2 g/dL (ref 1.5–4.5)
Glucose: 77 mg/dL (ref 70–99)
Potassium: 4.4 mmol/L (ref 3.5–5.2)
Sodium: 138 mmol/L (ref 134–144)
Total Protein: 7.3 g/dL (ref 6.0–8.5)
eGFR: 86 mL/min/1.73 (ref >59)

## 2023-12-26 LAB — LIPID PANEL WITH LDL/HDL RATIO
Cholesterol, Total: 168 mg/dL (ref 100–199)
HDL: 48 mg/dL (ref >39)
LDL Chol Calc (NIH): 102 mg/dL — ABNORMAL HIGH (ref 0–99)
LDL/HDL Ratio: 2.1 ratio (ref 0.0–3.2)
Triglycerides: 95 mg/dL (ref 0–149)
VLDL Cholesterol Cal: 18 mg/dL (ref 5–40)

## 2023-12-26 LAB — INSULIN, RANDOM: INSULIN: 19.3 u[IU]/mL (ref 2.6–24.9)

## 2023-12-26 LAB — VITAMIN D 25 HYDROXY (VIT D DEFICIENCY, FRACTURES): Vit D, 25-Hydroxy: 33.9 ng/mL (ref 30.0–100.0)

## 2023-12-26 LAB — TSH: TSH: 1.05 u[IU]/mL (ref 0.450–4.500)

## 2023-12-26 LAB — HEMOGLOBIN A1C
Est. average glucose Bld gHb Est-mCnc: 120 mg/dL
Hgb A1c MFr Bld: 5.8 % — ABNORMAL HIGH (ref 4.8–5.6)

## 2023-12-26 LAB — VITAMIN B12: Vitamin B-12: 681 pg/mL (ref 232–1245)

## 2023-12-27 ENCOUNTER — Other Ambulatory Visit: Payer: Self-pay

## 2024-01-08 ENCOUNTER — Ambulatory Visit: Attending: Internal Medicine | Admitting: Pharmacist

## 2024-01-08 DIAGNOSIS — Z7901 Long term (current) use of anticoagulants: Secondary | ICD-10-CM | POA: Insufficient documentation

## 2024-01-08 DIAGNOSIS — I824Y9 Acute embolism and thrombosis of unspecified deep veins of unspecified proximal lower extremity: Secondary | ICD-10-CM | POA: Diagnosis present

## 2024-01-08 DIAGNOSIS — I4891 Unspecified atrial fibrillation: Secondary | ICD-10-CM | POA: Diagnosis present

## 2024-01-08 LAB — POCT INR: INR: 2.1 (ref 2.0–3.0)

## 2024-01-08 NOTE — Patient Instructions (Addendum)
 Description   INR 2.1: Continue taking Warfarin 1.5 tablets daily except 2 tablets on Sundays.  Recheck INR 5 weeks.  Cardiac Clearance Fax # [phone] Coumadin  Clinic [phone]

## 2024-01-08 NOTE — Progress Notes (Signed)
 Description   INR 2.1: Continue taking Warfarin 1.5 tablets daily except 2 tablets on Sundays.  Recheck INR 5 weeks.  Cardiac Clearance Fax # [phone] Coumadin  Clinic [phone]

## 2024-01-19 NOTE — Progress Notes (Unsigned)
 SUBJECTIVE: Discussed the use of AI scribe software for clinical note transcription with the patient, who gave verbal consent to proceed.  Chief Complaint: Obesity  Interim History: She is up 4 lbs since her last visit.  Down 42 lbs overall.  TBW loss of 9.7%  Alexa [name] is here to discuss her progress with her obesity treatment plan. She is on the Category 3 Plan and states she is following her eating plan approximately 90 % of the time. She states she is exercising walking/dancing 20 minutes 3 times per week.  Alexa [name] is a 48 year old female with type 2 diabetes who presents for follow-up of her obesity treatment plan.  She is on the maximum dose of Ozempic  but is facing challenges with weight loss, noting a recent weight increase attributed to water retention and her menstrual cycle. Despite a busy schedule, her body adipose percentage is 73% and her visceral fat rating is 35.  Her type 2 diabetes is complicated by neuropathy. She is concerned about the impact of her weight on her diabetes and neuropathy. Although a switch to Mounjaro  was considered, insurance requirements necessitate completing the Ozempic  course first.  Her dietary habits are affected by her busy schedule, leading to skipped meals and small portions for breakfast and lunch. She sometimes indulges in snacks like popcorn or chocolate. She is on SNAP benefits, which currently do not impact her food choices.  She is taking potassium and Lasix  but has been out of bupropion  and B12, affecting her energy levels. She generally has good energy except during her menstrual cycle. No gastrointestinal side effects from Ozempic , such as constipation, diarrhea, nausea, or vomiting. OBJECTIVE: Visit Diagnoses: Problem List Items Addressed This Visit     Vitamin D  deficiency   Relevant Medications   Vitamin D , Ergocalciferol , (DRISDOL ) 1.25 MG (50000 UNIT) CAPS capsule   Bilateral lower extremity edema   Relevant Medications    furosemide  (LASIX ) 40 MG tablet   Type 2 diabetes mellitus with other specified complication (HCC) - Primary   Relevant Medications   tirzepatide  (MOUNJARO ) 7.5 MG/0.5ML Pen   metFORMIN  (GLUCOPHAGE ) 500 MG tablet   gabapentin  (NEURONTIN ) 300 MG capsule   B12 deficiency   Relevant Medications   Cyanocobalamin  (VITAMIN B 12) 500 MCG TABS   Depression   Relevant Medications   buPROPion  (WELLBUTRIN  SR) 150 MG 12 hr tablet   BMI 60.0-69.9, adult (HCC)   Relevant Medications   tirzepatide  (MOUNJARO ) 7.5 MG/0.5ML Pen   metFORMIN  (GLUCOPHAGE ) 500 MG tablet   Obesity, Beginning BMI 74.50   Relevant Medications   tirzepatide  (MOUNJARO ) 7.5 MG/0.5ML Pen   metFORMIN  (GLUCOPHAGE ) 500 MG tablet   Neuropathy involving both lower extremities   Relevant Medications   gabapentin  (NEURONTIN ) 300 MG capsule   buPROPion  (WELLBUTRIN  SR) 150 MG 12 hr tablet   Decreased potassium in the blood   Relevant Medications   potassium chloride  (KLOR-CON  M) 10 MEQ tablet  Obesity  Obesity with high body fat percentage (73%) and visceral fat rating (35). Current treatment with max dose Ozempic  for T2DM is not yielding desired weight loss results.  Discussed consideration of bariatric surgery, but the patient wishes to continue to work on nutrition/exercise and maximize medical therapy before consideration for bariatric surgery.  Discussed potential switch to Mounjaro , which targets two receptors compared to Ozempic 's one, potentially offering better efficacy. Emphasized dietary changes, particularly increasing protein intake and reducing carbohydrate consumption. Discussed risks of not addressing obesity, including potential heart problems and worsening  diabetes and neuropathy. She is not interested in bariatric surgery. We have reviewed the risks and benefits of using a GLP-1/GIP medication. The patient denies a personal or family history of medullary thyroid  cancer or MENII. The patient denies a history of  pancreatitis. The potential risks and benefits of this GLP-1/GIP medication were reviewed with the patient, and alternative treatment options were discussed. All questions were answered, and the patient wishes to move forward with this medication- Mounjaro . - Resubmit request for Mounjaro  approval and start at 7.5 mg if approved - Emphasize dietary changes focusing on increased protein intake and reduced carbohydrates - Provide meal planning tips and encourage meal prepping - Discuss potential need to increase Mounjaro  dose if 7.5 mg after 1 month.   Type 2 diabetes mellitus with diabetic polyneuropathy Type 2 diabetes with associated diabetic polyneuropathy. Current management includes Ozempic  2 mg weekly.  Denies mass in neck, dysphagia, dyspepsia, persistent hoarseness, abdominal pain, or N/V/Constipation or diarrhea. Has annual eye exam. Mood is stable.  On Metformin  500 mg BID. No GI or other SE.  On gabapentin  for neuropathy. Reports no SE and feels does help with neuropathy.  Lab Results  Component Value Date   HGBA1C 5.8 (H) 12/25/2023   HGBA1C 5.8 (H) 08/14/2023   HGBA1C 5.8 (H) 04/08/2023   Lab Results  Component Value Date   MICROALBUR 1.4 01/28/2016   LDLCALC 102 (H) 12/25/2023   CREATININE 0.85 12/25/2023   INSULIN   Date Value Ref Range Status  12/25/2023 19.3 2.6 - 24.9 uIU/mL Final  ] A1c improved. Insulin  levels remain elevated/not at goal.  Discussed potential benefits of switching to Mounjaro  which may positively impact diabetes management. - Switch to Mounjaro  pending approval Refill and continue metformin  500 mg BID. Refill and continue gabapentin  300 mg BID for neuropathy.  - Monitor diabetes control and neuropathy symptoms closely -She is working  on nutrition plan to decrease simple carbohydrates, increase lean proteins and exercise to promote weight loss and improve glycemic control . Meds ordered this encounter  Medications   tirzepatide  (MOUNJARO ) 7.5  MG/0.5ML Pen    Sig: Inject 7.5 mg into the skin once a week.    Dispense:  2 mL    Refill:  0   Vitamin D , Ergocalciferol , (DRISDOL ) 1.25 MG (50000 UNIT) CAPS capsule    Sig: Take 1 capsule (50,000 Units total) by mouth every 7 (seven) days.    Dispense:  4 capsule    Refill:  1   metFORMIN  (GLUCOPHAGE ) 500 MG tablet    Sig: Take 1 tablet (500 mg total) by mouth 2 (two) times daily with a meal.    Dispense:  60 tablet    Refill:  1   gabapentin  (NEURONTIN ) 300 MG capsule    Sig: Take 1 capsule (300 mg total) by mouth 2 (two) times daily.    Dispense:  60 capsule    Refill:  1   buPROPion  (WELLBUTRIN  SR) 150 MG 12 hr tablet    Sig: Take 1 tablet (150 mg total) by mouth daily.    Dispense:  90 tablet    Refill:  0    PLEASE DISREGARD PREVIOUS PRESCRIPTION FOR 30 DAYS   furosemide  (LASIX ) 40 MG tablet    Sig: Take 1 tablet (40 mg total) by mouth 2 (two) times daily.    Dispense:  180 tablet    Refill:  1   potassium chloride  (KLOR-CON  M) 10 MEQ tablet    Sig: Take 1 tablet (10 mEq total)  by mouth 2 (two) times daily.    Dispense:  60 tablet    Refill:  5   Cyanocobalamin  (VITAMIN B 12) 500 MCG TABS    Sig: Take 2 tablets (1,000 mcg) by mouth daily.    Dispense:  30 tablet    Refill:  0    Vitamin D  Deficiency Vitamin D  is not at goal of 50.  Most recent vitamin D  level was 33.9. She is on  prescription ergocalciferol  50,000 IU weekly. No N/V or muscle weakness. Reports may be out of vitamin D   Lab Results  Component Value Date   VD25OH 33.9 12/25/2023   VD25OH 40.5 08/14/2023   VD25OH 36.1 04/08/2023    Plan: Continue and refill  prescription ergocalciferol  50,000 IU weekly Low vitamin D  levels can be associated with adiposity and may result in leptin resistance and weight gain. Also associated with fatigue.  Currently on vitamin D  supplementation without any adverse effects such as nausea, vomiting or muscle weakness.    Deficiency of B 12 B group vitamin  deficiency, specifically B12, important for energy levels. She reports running out of B12 before other medications. Lab Results  Component Value Date   VITAMINB12 681 12/25/2023   Plan: Continue B 12 500 mcg daily.  - Refill B12 prescription  Bilateral lower extremity edema On Lasix  40 mg BID and KCL 10 mEq BID without SE. Reports edema is currently well controlled. Potassium supplement while on lasix .  Lab Results  Component Value Date   NA 138 12/25/2023   CL 102 12/25/2023   K 4.4 12/25/2023   CO2 22 12/25/2023   BUN 10 12/25/2023   CREATININE 0.85 12/25/2023   EGFR 86 12/25/2023   CALCIUM  8.9 12/25/2023   PHOS 4.3 06/14/2017   ALBUMIN 4.1 12/25/2023   GLUCOSE 77 12/25/2023    Plan: REfill and continue lasix  and KCL Continue to work on nutrition plan to promote weight loss and improve edema control.    Eating disorder/emotional eating Alexa [name] has had issues with stress/emotional eating. Currently this is poorly controlled. Overall mood is stable. Medication(s): Bupropion  SR 150 mg daily in am No SE. Reports she ran out of the bupropion .   Plan: Continue and refill Bupropion  SR 150 mg daily in am Continue to work on emotional eating strategies.  Vitals Temp: 98.1 F (36.7 C) BP: 110/74 Pulse Rate: 87 SpO2: 99 %   Anthropometric Measurements Height: 5' 4 (1.626 m) Weight: (!) 392 lb (177.8 kg) BMI (Calculated): 67.25 Weight at Last Visit: 388lb Weight Lost Since Last Visit: 0lb Weight Gained Since Last Visit: 4lb Starting Weight: 434lb Total Weight Loss (lbs): 42 lb (19.1 kg)   Body Composition  Body Fat %: 72.7 % Fat Mass (lbs): 285.6 lbs Muscle Mass (lbs): 101.8 lbs Visceral Fat Rating : 35   Other Clinical Data Fasting: No Labs: No Today's Visit #: 45 Starting Date: 12/23/20     ASSESSMENT AND PLAN:  Diet: Alzora is currently in the action stage of change. As such, her goal is to continue with weight loss efforts. She has agreed to  Category 3 Plan.  Exercise: Alexa [name] has been instructed to work up to a goal of 150 minutes of combined cardio and strengthening exercise per week and to try a geriatric exercise plan for weight loss and overall health benefits.   Behavior Modification:  We discussed the following Behavioral Modification Strategies today: increasing lean protein intake, decreasing simple carbohydrates, increasing vegetables, increase H2O intake, increase high fiber foods,  no skipping meals, meal planning and cooking strategies, better snacking choices, emotional eating strategies , avoiding temptations, planning for success, and decrease junk food . We discussed various medication options to help Alexa [name] with her weight loss efforts and we both agreed to zsx.  Return in about 4 weeks (around 02/17/2024).Alexa [name] She was informed of the importance of frequent follow up visits to maximize her success with intensive lifestyle modifications for her multiple health conditions.  Attestation Statements:   Reviewed by clinician on day of visit: allergies, medications, problem list, medical history, surgical history, family history, social history, and previous encounter notes.   Time spent on visit including pre-visit chart review and post-visit care and charting was 48 minutes.    Alexa Markov, PA-C

## 2024-01-20 ENCOUNTER — Encounter (INDEPENDENT_AMBULATORY_CARE_PROVIDER_SITE_OTHER): Payer: Self-pay | Admitting: Physician Assistant

## 2024-01-20 ENCOUNTER — Ambulatory Visit (INDEPENDENT_AMBULATORY_CARE_PROVIDER_SITE_OTHER): Admitting: Physician Assistant

## 2024-01-20 ENCOUNTER — Other Ambulatory Visit: Payer: Self-pay

## 2024-01-20 ENCOUNTER — Other Ambulatory Visit (HOSPITAL_COMMUNITY): Payer: Self-pay

## 2024-01-20 VITALS — BP 110/74 | HR 87 | Temp 98.1°F | Ht 64.0 in | Wt 392.0 lb

## 2024-01-20 DIAGNOSIS — E1169 Type 2 diabetes mellitus with other specified complication: Secondary | ICD-10-CM

## 2024-01-20 DIAGNOSIS — F3289 Other specified depressive episodes: Secondary | ICD-10-CM | POA: Diagnosis not present

## 2024-01-20 DIAGNOSIS — E876 Hypokalemia: Secondary | ICD-10-CM

## 2024-01-20 DIAGNOSIS — E559 Vitamin D deficiency, unspecified: Secondary | ICD-10-CM

## 2024-01-20 DIAGNOSIS — Z6841 Body Mass Index (BMI) 40.0 and over, adult: Secondary | ICD-10-CM

## 2024-01-20 DIAGNOSIS — G5793 Unspecified mononeuropathy of bilateral lower limbs: Secondary | ICD-10-CM

## 2024-01-20 DIAGNOSIS — E538 Deficiency of other specified B group vitamins: Secondary | ICD-10-CM | POA: Diagnosis not present

## 2024-01-20 DIAGNOSIS — E669 Obesity, unspecified: Secondary | ICD-10-CM

## 2024-01-20 DIAGNOSIS — Z7984 Long term (current) use of oral hypoglycemic drugs: Secondary | ICD-10-CM

## 2024-01-20 DIAGNOSIS — R600 Localized edema: Secondary | ICD-10-CM

## 2024-01-20 DIAGNOSIS — Z7985 Long-term (current) use of injectable non-insulin antidiabetic drugs: Secondary | ICD-10-CM

## 2024-01-20 DIAGNOSIS — E1142 Type 2 diabetes mellitus with diabetic polyneuropathy: Secondary | ICD-10-CM

## 2024-01-20 MED ORDER — FUROSEMIDE 40 MG PO TABS
40.0000 mg | ORAL_TABLET | Freq: Two times a day (BID) | ORAL | 1 refills | Status: DC
  Filled 2024-01-20: qty 180, 90d supply, fill #0

## 2024-01-20 MED ORDER — VITAMIN D (ERGOCALCIFEROL) 1.25 MG (50000 UNIT) PO CAPS
50000.0000 [IU] | ORAL_CAPSULE | ORAL | 1 refills | Status: DC
  Filled 2024-01-20: qty 4, 28d supply, fill #0

## 2024-01-20 MED ORDER — GABAPENTIN 300 MG PO CAPS
300.0000 mg | ORAL_CAPSULE | Freq: Two times a day (BID) | ORAL | 1 refills | Status: DC
  Filled 2024-01-20: qty 60, 30d supply, fill #0

## 2024-01-20 MED ORDER — TIRZEPATIDE 7.5 MG/0.5ML ~~LOC~~ SOAJ
7.5000 mg | SUBCUTANEOUS | 0 refills | Status: AC
  Filled 2024-01-20: qty 2, 28d supply, fill #0

## 2024-01-20 MED ORDER — VITAMIN B 12 500 MCG PO TABS
1000.0000 ug | ORAL_TABLET | Freq: Every day | ORAL | 0 refills | Status: DC
  Filled 2024-01-20 (×2): qty 30, 15d supply, fill #0

## 2024-01-20 MED ORDER — BUPROPION HCL ER (SR) 150 MG PO TB12
150.0000 mg | ORAL_TABLET | Freq: Every day | ORAL | 0 refills | Status: DC
  Filled 2024-01-20: qty 90, 90d supply, fill #0

## 2024-01-20 MED ORDER — METFORMIN HCL 500 MG PO TABS
500.0000 mg | ORAL_TABLET | Freq: Two times a day (BID) | ORAL | 1 refills | Status: DC
  Filled 2024-01-20: qty 60, 30d supply, fill #0

## 2024-01-20 MED ORDER — POTASSIUM CHLORIDE CRYS ER 10 MEQ PO TBCR
10.0000 meq | EXTENDED_RELEASE_TABLET | Freq: Two times a day (BID) | ORAL | 5 refills | Status: DC
  Filled 2024-01-20: qty 60, 30d supply, fill #0

## 2024-01-21 ENCOUNTER — Encounter (INDEPENDENT_AMBULATORY_CARE_PROVIDER_SITE_OTHER): Payer: Self-pay | Admitting: *Deleted

## 2024-02-12 ENCOUNTER — Telehealth: Payer: Self-pay | Admitting: *Deleted

## 2024-02-12 ENCOUNTER — Telehealth: Payer: Self-pay | Admitting: Pharmacy Technician

## 2024-02-12 ENCOUNTER — Ambulatory Visit: Attending: Internal Medicine | Admitting: *Deleted

## 2024-02-12 ENCOUNTER — Other Ambulatory Visit (HOSPITAL_COMMUNITY): Payer: Self-pay

## 2024-02-12 DIAGNOSIS — Z7901 Long term (current) use of anticoagulants: Secondary | ICD-10-CM | POA: Diagnosis present

## 2024-02-12 DIAGNOSIS — Z5181 Encounter for therapeutic drug level monitoring: Secondary | ICD-10-CM | POA: Insufficient documentation

## 2024-02-12 DIAGNOSIS — I824Y9 Acute embolism and thrombosis of unspecified deep veins of unspecified proximal lower extremity: Secondary | ICD-10-CM | POA: Insufficient documentation

## 2024-02-12 DIAGNOSIS — I4891 Unspecified atrial fibrillation: Secondary | ICD-10-CM | POA: Insufficient documentation

## 2024-02-12 LAB — POCT INR: INR: 2.4 (ref 2.0–3.0)

## 2024-02-12 NOTE — Patient Instructions (Signed)
 Description   INR 2.4; Continue taking Warfarin 1.5 tablets daily except 2 tablets on Sundays.  Recheck INR 6 weeks.  Cardiac Clearance Fax # [phone] Coumadin  Clinic [phone]

## 2024-02-12 NOTE — Progress Notes (Signed)
 Description   INR 2.4; Continue taking Warfarin 1.5 tablets daily except 2 tablets on Sundays.  Recheck INR 6 weeks.  Cardiac Clearance Fax # [phone] Coumadin  Clinic [phone]

## 2024-02-12 NOTE — Telephone Encounter (Signed)
 Ran test claim for eliquis . For a 30 or 90 day supply and the co-pay is 4.00 . PA is not needed at this time. No message was given stating this is a transition fill.  This test claim was processed through [facility]- copay amounts may vary at other pharmacies due to pharmacy/plan contracts, or as the patient moves through the different stages of their insurance plan.

## 2024-02-12 NOTE — Telephone Encounter (Signed)
 Spoke with Alexa [name], pharmacy tech and she stated it would be $4 for a 30 or 90 day supply of eliquis 5mg  tablets (please see the previous note from today from [place]).   Called patient to follow up with her regarding the price of eliquis 5mg  twice a day but there was no answer so left her a message to call back [phone] regarding this.   Pt does get her meds via [name] as discussed at today's Anticoagulation Visit.   Age-89 yrs Wt-177.8kg Crea-0.85 01/04/24 Eliquis 5mg  BID correct dose

## 2024-02-13 ENCOUNTER — Other Ambulatory Visit: Payer: Self-pay

## 2024-02-13 ENCOUNTER — Other Ambulatory Visit (HOSPITAL_COMMUNITY): Payer: Self-pay

## 2024-02-13 MED ORDER — APIXABAN 5 MG PO TABS
5.0000 mg | ORAL_TABLET | Freq: Two times a day (BID) | ORAL | 5 refills | Status: AC
  Filled 2024-02-13 – 2024-02-14 (×2): qty 60, 30d supply, fill #0
  Filled 2024-03-12: qty 60, 30d supply, fill #1
  Filled 2024-04-14: qty 60, 30d supply, fill #2

## 2024-02-13 NOTE — Telephone Encounter (Addendum)
 Called and spoke to pt. She stated she would like to switch to Eliquis. Made her aware of the cost. Informed pt to STOP warfarin ( last dose of warfarin was last night 02/12/2024). Instructed pt to START Eliquis on 11/21 in the evening, taking 1 tablet by mouth about every 12 hours. Pt stated that she will take Eliquis at 6am and 6pm.   A full discussion of the nature of anticoagulants has been carried out.  A benefit/risk analysis has been presented to the patient, so that they understand the justification for choosing anticoagulation with Eliquis at this time.  The need for compliance is stressed.  Pt is aware to take the medication twice daily.  Side effects of potential bleeding are discussed, including unusual colored urine or stools, coughing up blood or coffee ground emesis, nose bleeds or serious fall or head trauma.  Discussed signs and symptoms of stroke. The patient should avoid any OTC items containing aspirin or ibuprofen.  Avoid alcohol consumption.   Call if any signs of abnormal bleeding.

## 2024-02-14 ENCOUNTER — Other Ambulatory Visit: Payer: Self-pay

## 2024-02-14 ENCOUNTER — Other Ambulatory Visit (HOSPITAL_COMMUNITY): Payer: Self-pay

## 2024-02-17 ENCOUNTER — Other Ambulatory Visit (HOSPITAL_COMMUNITY): Payer: Self-pay

## 2024-02-17 ENCOUNTER — Ambulatory Visit (INDEPENDENT_AMBULATORY_CARE_PROVIDER_SITE_OTHER): Payer: Self-pay | Admitting: Physician Assistant

## 2024-02-17 ENCOUNTER — Encounter (INDEPENDENT_AMBULATORY_CARE_PROVIDER_SITE_OTHER): Payer: Self-pay | Admitting: Physician Assistant

## 2024-02-17 ENCOUNTER — Telehealth (INDEPENDENT_AMBULATORY_CARE_PROVIDER_SITE_OTHER): Payer: Self-pay | Admitting: *Deleted

## 2024-02-17 ENCOUNTER — Other Ambulatory Visit: Payer: Self-pay

## 2024-02-17 ENCOUNTER — Other Ambulatory Visit (HOSPITAL_BASED_OUTPATIENT_CLINIC_OR_DEPARTMENT_OTHER): Payer: Self-pay

## 2024-02-17 VITALS — BP 120/78 | HR 80 | Temp 98.2°F | Ht 64.0 in | Wt 380.0 lb

## 2024-02-17 DIAGNOSIS — F3289 Other specified depressive episodes: Secondary | ICD-10-CM

## 2024-02-17 DIAGNOSIS — L500 Allergic urticaria: Secondary | ICD-10-CM

## 2024-02-17 DIAGNOSIS — E559 Vitamin D deficiency, unspecified: Secondary | ICD-10-CM

## 2024-02-17 DIAGNOSIS — E876 Hypokalemia: Secondary | ICD-10-CM

## 2024-02-17 DIAGNOSIS — E1142 Type 2 diabetes mellitus with diabetic polyneuropathy: Secondary | ICD-10-CM | POA: Diagnosis not present

## 2024-02-17 DIAGNOSIS — E538 Deficiency of other specified B group vitamins: Secondary | ICD-10-CM

## 2024-02-17 DIAGNOSIS — F5089 Other specified eating disorder: Secondary | ICD-10-CM | POA: Diagnosis not present

## 2024-02-17 DIAGNOSIS — Z6841 Body Mass Index (BMI) 40.0 and over, adult: Secondary | ICD-10-CM

## 2024-02-17 DIAGNOSIS — Z7984 Long term (current) use of oral hypoglycemic drugs: Secondary | ICD-10-CM

## 2024-02-17 DIAGNOSIS — Z7985 Long-term (current) use of injectable non-insulin antidiabetic drugs: Secondary | ICD-10-CM

## 2024-02-17 DIAGNOSIS — R600 Localized edema: Secondary | ICD-10-CM

## 2024-02-17 DIAGNOSIS — E1169 Type 2 diabetes mellitus with other specified complication: Secondary | ICD-10-CM

## 2024-02-17 DIAGNOSIS — E66813 Obesity, class 3: Secondary | ICD-10-CM

## 2024-02-17 DIAGNOSIS — G5793 Unspecified mononeuropathy of bilateral lower limbs: Secondary | ICD-10-CM

## 2024-02-17 MED ORDER — CETIRIZINE HCL 10 MG PO TABS
10.0000 mg | ORAL_TABLET | Freq: Every day | ORAL | 5 refills | Status: AC
  Filled 2024-02-17: qty 30, 30d supply, fill #0
  Filled 2024-03-12: qty 30, 30d supply, fill #1
  Filled 2024-04-14: qty 30, 30d supply, fill #2

## 2024-02-17 MED ORDER — METFORMIN HCL 500 MG PO TABS
500.0000 mg | ORAL_TABLET | Freq: Two times a day (BID) | ORAL | 1 refills | Status: AC
  Filled 2024-02-17: qty 60, 30d supply, fill #0
  Filled 2024-03-12: qty 60, 30d supply, fill #1

## 2024-02-17 MED ORDER — VITAMIN D (ERGOCALCIFEROL) 1.25 MG (50000 UNIT) PO CAPS
50000.0000 [IU] | ORAL_CAPSULE | ORAL | 1 refills | Status: AC
  Filled 2024-02-17: qty 4, 28d supply, fill #0
  Filled 2024-03-12: qty 4, 28d supply, fill #1

## 2024-02-17 MED ORDER — GABAPENTIN 300 MG PO CAPS
300.0000 mg | ORAL_CAPSULE | Freq: Two times a day (BID) | ORAL | 1 refills | Status: AC
  Filled 2024-02-17: qty 60, 30d supply, fill #0
  Filled 2024-03-12: qty 60, 30d supply, fill #1

## 2024-02-17 MED ORDER — VITAMIN B 12 500 MCG PO TABS
1000.0000 ug | ORAL_TABLET | Freq: Every day | ORAL | 0 refills | Status: DC
  Filled 2024-02-17: qty 30, 15d supply, fill #0

## 2024-02-17 MED ORDER — BUPROPION HCL ER (SR) 150 MG PO TB12
150.0000 mg | ORAL_TABLET | Freq: Every day | ORAL | 0 refills | Status: AC
  Filled 2024-02-17 – 2024-04-14 (×2): qty 90, 90d supply, fill #0

## 2024-02-17 MED ORDER — FUROSEMIDE 40 MG PO TABS
40.0000 mg | ORAL_TABLET | Freq: Two times a day (BID) | ORAL | 1 refills | Status: DC
  Filled 2024-02-17 – 2024-03-16 (×3): qty 180, 90d supply, fill #0

## 2024-02-17 MED ORDER — POTASSIUM CHLORIDE CRYS ER 10 MEQ PO TBCR
10.0000 meq | EXTENDED_RELEASE_TABLET | Freq: Two times a day (BID) | ORAL | 5 refills | Status: AC
  Filled 2024-02-17: qty 60, 30d supply, fill #0
  Filled 2024-03-12: qty 60, 30d supply, fill #1
  Filled 2024-04-14: qty 60, 30d supply, fill #2

## 2024-02-17 NOTE — Progress Notes (Signed)
 SUBJECTIVE: Discussed the use of AI scribe software for clinical note transcription with the patient, who gave verbal consent to proceed.  Chief Complaint: Obesity  Interim History: She is down 12 lbs since her last visit Down 54 lbs overall TBW Loss of 12.4%  Alexa [name] is here to discuss her progress with her obesity treatment plan. She is on the Category 3 Plan and states she is following her eating plan approximately 85-90 % of the time. She states she is exercising dancing/climbing stairs 15-20 minutes 3 times per week.  Alexa [name] is a 48 year old female who presents for weight management and medication review.  She has lost twelve pounds since her last visit, attributing this to dietary changes including increased intake of whole foods and protein, as well as staying hydrated. She engages in physical activity by dancing for fifteen to twenty minutes three days per week and feels that having breakfast has been beneficial for her metabolism.  She is currently using Ozempic  and has two pens remaining. There was an issue with receiving Mounjaro , which she has not yet started, and she plans to contact the pharmacy to resolve this. She is also on Eliquis , gabapentin , vitamin B, Zyrtec , and Lasix , with some refills needed.  Her social history includes cooking for Thanksgiving and planning meals to avoid food waste. She emphasizes the importance of meal planning and preparation to stay on track with her weight management goals. She is actively trying to maintain her physical activity by incorporating movement into her daily routine, even at work.  She has lost a total of fifty-four pounds. She is focused on continuing her current regimen to maintain her progress. OBJECTIVE: Visit Diagnoses: Problem List Items Addressed This Visit     Vitamin D  deficiency   Relevant Medications   Vitamin D , Ergocalciferol , (DRISDOL ) 1.25 MG (50000 UNIT) CAPS capsule   Class 3 severe obesity with  serious comorbidity and body mass index (BMI) greater than or equal to 70 in adult [facility])   Relevant Medications   metFORMIN  (GLUCOPHAGE ) 500 MG tablet   Bilateral lower extremity edema   Relevant Medications   furosemide  (LASIX ) 40 MG tablet   Diabetes mellitus (HCC) - Primary   Relevant Medications   gabapentin  (NEURONTIN ) 300 MG capsule   metFORMIN  (GLUCOPHAGE ) 500 MG tablet   Type 2 diabetes mellitus with other specified complication (HCC)   Relevant Medications   gabapentin  (NEURONTIN ) 300 MG capsule   metFORMIN  (GLUCOPHAGE ) 500 MG tablet   B12 deficiency   Relevant Medications   Cyanocobalamin  (VITAMIN B 12) 500 MCG TABS   Depression   Relevant Medications   buPROPion  (WELLBUTRIN  SR) 150 MG 12 hr tablet   Neuropathy involving both lower extremities   Relevant Medications   buPROPion  (WELLBUTRIN  SR) 150 MG 12 hr tablet   gabapentin  (NEURONTIN ) 300 MG capsule   Decreased potassium in the blood   Relevant Medications   potassium chloride  (KLOR-CON  M) 10 MEQ tablet   Other Visit Diagnoses       Allergic urticaria       Relevant Medications   cetirizine  (ZYRTEC ) 10 MG tablet      Obesity, class 3 with type 2 diabetes mellitus and other specified complication Significant weight loss of 12 pounds since last visit, attributed to dietary changes and increased physical activity.  Muscle mass has increased, and adipose tissue has decreased.  Awaiting Mounjaro  prescription, currently on Ozempic . Discussed potential transition to Mounjaro  and possibility of adjusting dosage based on response.  Emphasized importance of maintaining current lifestyle changes to continue weight loss and improve overall health. - Continue current dietary and exercise regimen. Love the meal planning and prepping!!!! Keep it going!!! - Await Mounjaro  prescription and will transition from Ozempic  if received. - Monitor response to Mounjaro  and adjust dosage if necessary. - Scheduled follow-up appointment  on Christmas Eve.   Type 2 Diabetes Mellitus with polyneuropathy, without long-term current use of insulin  HgbA1c is at goal. Last A1c was 5.8  Medication(s): Ozempic  2 mg SQ weekly  Denies mass in neck, dysphagia, dyspepsia, persistent hoarseness, abdominal pain, or N/V/Constipation or diarrhea. Has annual eye exam. Mood is stable.   Lab Results  Component Value Date   HGBA1C 5.8 (H) 12/25/2023   HGBA1C 5.8 (H) 08/14/2023   HGBA1C 5.8 (H) 04/08/2023   Lab Results  Component Value Date   MICROALBUR 1.4 01/28/2016   LDLCALC 102 (H) 12/25/2023   CREATININE 0.85 12/25/2023   No results found for: GFR  Plan: Change to Mounjaro  7.5 mg SQ weekly and titrate for response.  She is working  on nutrition plan to decrease simple carbohydrates, increase lean proteins and exercise to promote weight loss and improve glycemic control . Refill and continue gabapentin  300 mg BID for neuropathy and monitor response  Bilateral lower extremity edema/ History of low potassium related to diuretic use Continues Lasix  40 mg up to BID for edema.  On KCl supplement with normal potassium levels .  Edema stable currently.  Lab Results  Component Value Date   NA 138 12/25/2023   CL 102 12/25/2023   K 4.4 12/25/2023   CO2 22 12/25/2023   BUN 10 12/25/2023   CREATININE 0.85 12/25/2023   EGFR 86 12/25/2023   CALCIUM  8.9 12/25/2023   PHOS 4.3 06/14/2017   ALBUMIN 4.1 12/25/2023   GLUCOSE 77 12/25/2023   Plan: Refill Lasix  40 mg BID and KCl 10 mEq BID  Monitor response  Deficiency of B 12 Continues to take vitamin B 12 500 mcg daily supplements. Reports improved energy levels.  Lab Results  Component Value Date   VITAMINB12 681 12/25/2023   Plan - Refilled vitamin B 500 mcg daily prescription.  Allergic urticaria Taking Zyrtec  with good effect. Needs refill Plan Refilled and continue Zyrtec  10 mg daily and monitor response  Eating disorder/emotional eating Holley has had issues with  stress/emotional eating. Currently this is well controlled. Overall mood is stable. Medication(s): Bupropion  SR 150 mg daily in am  Plan: Continue and refill Bupropion  SR 150 mg daily in am Continue to work on emotional eating behaviors.    Vitamin D  Deficiency Vitamin D  is not at goal of 50.  Most recent vitamin D  level was 33.9. She is on  prescription ergocalciferol  50,000 IU weekly. No N/V or muscle weakness on Ergocalciferol   Lab Results  Component Value Date   VD25OH 33.9 12/25/2023   VD25OH 40.5 08/14/2023   VD25OH 36.1 04/08/2023    Plan: Continue and refill  prescription ergocalciferol  50,000 IU weekly Low vitamin D  levels can be associated with adiposity and may result in leptin resistance and weight gain. Also associated with fatigue.  Currently on vitamin D  supplementation without any adverse effects such as nausea, vomiting or muscle weakness.   General Health Maintenance Discussed importance of meal planning and preparation to maintain dietary goals. Encouraged continued physical activity and hydration. Discussed potential for significant weight loss if current regimen is maintained. - Continue meal planning and preparation. - Maintain physical activity and hydration. -  Monitor weight and adjust plan as needed. Vitals Temp: 98.2 F (36.8 C) BP: 120/78 Pulse Rate: 80 SpO2: 98 %   Anthropometric Measurements Height: 5' 4 (1.626 m) Weight: (!) 380 lb (172.4 kg) BMI (Calculated): 65.19 Weight at Last Visit: 392 lb Weight Lost Since Last Visit: 12 lb Weight Gained Since Last Visit: 0 Starting Weight: 434 lb Total Weight Loss (lbs): 54 lb (24.5 kg)   Body Composition  Body Fat %: 70.5 % Fat Mass (lbs): 268 lbs Muscle Mass (lbs): 106.6 lbs Visceral Fat Rating : 33   Other Clinical Data Fasting: No Labs: No Today's Visit #: 46 Starting Date: 12/23/20     ASSESSMENT AND PLAN:  Diet: Nazarene is currently in the action stage of change. As such,  her goal is to continue with weight loss efforts. She has agreed to Category 3 Plan.  Exercise: Jaimi has been instructed to work up to a goal of 150 minutes of combined cardio and strengthening exercise per week, to try a geriatric exercise plan, and to continue exercising as is for weight loss and overall health benefits.   Behavior Modification:  We discussed the following Behavioral Modification Strategies today: increasing lean protein intake, decreasing simple carbohydrates, increasing vegetables, increase H2O intake, increase high fiber foods, no skipping meals, meal planning and cooking strategies, holiday eating strategies, avoiding temptations, and planning for success. We discussed various medication options to help Yanessa with her weight loss efforts and we both agreed to switch to Mounjaro  7.5 mg weekly if approved for primary indication of T2DM.  Return in about 4 weeks (around 03/16/2024).[name] She was informed of the importance of frequent follow up visits to maximize her success with intensive lifestyle modifications for her multiple health conditions.  Attestation Statements:   Reviewed by clinician on day of visit: allergies, medications, problem list, medical history, surgical history, family history, social history, and previous encounter notes.   Time spent on visit including pre-visit chart review and post-visit care and charting was 32 minutes.    Rakeya Glab, PA-C

## 2024-02-17 NOTE — Telephone Encounter (Signed)
 Fredie Collet ([employer] ROWELS)  [facility] is processing your PA request and will respond shortly with next steps. You may close this dialog, return to your dashboard, and perform other tasks. To check for an update later, open this request again from your dashboard.  If you need assistance, please chat with [employer] or call us  at [phone].

## 2024-02-17 NOTE — Telephone Encounter (Signed)
 Fredie Collet ([employer] ROWELS)  [facility] has not yet replied to your PA request. You may close this dialog box, return to your dashboard, and perform other tasks. To check for an update later, open this request again from your dashboard.  For Commercial Requests: If [facility] has not replied to your request within 24 hours, please contact [facility] at the provider services number on the [employer] card.  For Exchange Requests:If [facility] has not replied to your standard request within 72 hours or your urgent request within 24 hours, please contact [facility] at the provider services number on the [employer] card.

## 2024-02-18 ENCOUNTER — Other Ambulatory Visit: Payer: Self-pay

## 2024-02-18 NOTE — Telephone Encounter (Signed)
 Alexa [name] ([employer] ROWELS)  This request has been approved.  Please note any additional information provided by [facility] at the bottom of your screen.   Patient notified via Mychart message.

## 2024-02-21 ENCOUNTER — Other Ambulatory Visit (HOSPITAL_COMMUNITY): Payer: Self-pay

## 2024-03-10 ENCOUNTER — Telehealth: Payer: Self-pay

## 2024-03-10 NOTE — Telephone Encounter (Signed)
 Called pt to advise parking placard has been completed. Left voicemail

## 2024-03-12 ENCOUNTER — Other Ambulatory Visit (HOSPITAL_COMMUNITY): Payer: Self-pay

## 2024-03-12 ENCOUNTER — Other Ambulatory Visit: Payer: Self-pay

## 2024-03-12 ENCOUNTER — Other Ambulatory Visit (INDEPENDENT_AMBULATORY_CARE_PROVIDER_SITE_OTHER): Payer: Self-pay | Admitting: Physician Assistant

## 2024-03-12 DIAGNOSIS — E538 Deficiency of other specified B group vitamins: Secondary | ICD-10-CM

## 2024-03-16 ENCOUNTER — Other Ambulatory Visit (HOSPITAL_COMMUNITY): Payer: Self-pay

## 2024-03-16 ENCOUNTER — Other Ambulatory Visit (INDEPENDENT_AMBULATORY_CARE_PROVIDER_SITE_OTHER): Payer: Self-pay | Admitting: Physician Assistant

## 2024-03-16 DIAGNOSIS — E538 Deficiency of other specified B group vitamins: Secondary | ICD-10-CM

## 2024-03-17 ENCOUNTER — Other Ambulatory Visit (INDEPENDENT_AMBULATORY_CARE_PROVIDER_SITE_OTHER): Payer: Self-pay | Admitting: Physician Assistant

## 2024-03-17 ENCOUNTER — Other Ambulatory Visit (HOSPITAL_COMMUNITY): Payer: Self-pay

## 2024-03-17 ENCOUNTER — Other Ambulatory Visit: Payer: Self-pay

## 2024-03-17 DIAGNOSIS — R600 Localized edema: Secondary | ICD-10-CM

## 2024-03-17 DIAGNOSIS — E538 Deficiency of other specified B group vitamins: Secondary | ICD-10-CM

## 2024-03-17 MED ORDER — FUROSEMIDE 40 MG PO TABS
40.0000 mg | ORAL_TABLET | Freq: Two times a day (BID) | ORAL | 1 refills | Status: AC
  Filled 2024-03-17 – 2024-03-18 (×2): qty 180, 90d supply, fill #0
  Filled 2024-03-18 (×3): qty 60, 30d supply, fill #0
  Filled 2024-04-14: qty 60, 30d supply, fill #1

## 2024-03-17 MED ORDER — VITAMIN B 12 500 MCG PO TABS
1000.0000 ug | ORAL_TABLET | Freq: Every day | ORAL | 0 refills | Status: AC
  Filled 2024-03-17: qty 30, 15d supply, fill #0

## 2024-03-17 NOTE — Progress Notes (Deleted)
" ° °  SUBJECTIVE: Discussed the use of AI scribe software for clinical note transcription with the patient, who gave verbal consent to proceed.  Chief Complaint: Obesity  Interim History: ***  Alexa [name] is here to discuss her progress with her obesity treatment plan. She is on the {HWW Weight Loss Plan:210964005} and states she {CHL AMB IS/IS NOT:210130109} following her eating plan approximately *** % of the time. She states she {CHL AMB IS/IS NOT:210130109} exercising *** minutes *** times per week.   OBJECTIVE: Visit Diagnoses: Problem List Items Addressed This Visit     Vitamin D  deficiency   Bilateral lower extremity edema   Diabetes mellitus (HCC) - Primary   Depression   Obesity, Beginning BMI 74.50    No data recorded No data recorded No data recorded No data recorded   ASSESSMENT AND PLAN:  Diet: Alexa [name] {CHL AMB IS/IS NOT:210130109} currently in the action stage of change. As such, her goal is to {HWW Weight Loss Efforts:210964006}. She {HAS HAS WNU:81165} agreed to {HWW Weight Loss Plan:210964005}.  Exercise: Alexa [name] has been instructed {HWW Exercise:210964007} for weight loss and overall health benefits.   Behavior Modification:  We discussed the following Behavioral Modification Strategies today: {HWW Behavior Modification:210964008}. We discussed various medication options to help Alexa [name] with her weight loss efforts and we both agreed to ***.  No follow-ups on file.Alexa [name] She was informed of the importance of frequent follow up visits to maximize her success with intensive lifestyle modifications for her multiple health conditions.  Attestation Statements:   Reviewed by clinician on day of visit: allergies, medications, problem list, medical history, surgical history, family history, social history, and previous encounter notes.   Time spent on visit including pre-visit chart review and post-visit care and charting was *** minutes.    Ryann Leavitt, PA-C  "

## 2024-03-17 NOTE — Telephone Encounter (Signed)
 Pt called and cancelled her appt due to transportation issues. Patient still needs refills of Furosemide  and Vitamin B. Pt states that she requested these medication refills on her last visit with Shawn.

## 2024-03-18 ENCOUNTER — Other Ambulatory Visit (HOSPITAL_COMMUNITY): Payer: Self-pay

## 2024-03-18 ENCOUNTER — Ambulatory Visit (INDEPENDENT_AMBULATORY_CARE_PROVIDER_SITE_OTHER): Admitting: Physician Assistant

## 2024-03-25 ENCOUNTER — Ambulatory Visit

## 2024-04-14 ENCOUNTER — Other Ambulatory Visit (INDEPENDENT_AMBULATORY_CARE_PROVIDER_SITE_OTHER): Payer: Self-pay | Admitting: Physician Assistant

## 2024-04-14 ENCOUNTER — Other Ambulatory Visit: Payer: Self-pay

## 2024-04-14 ENCOUNTER — Other Ambulatory Visit (HOSPITAL_COMMUNITY): Payer: Self-pay

## 2024-04-14 DIAGNOSIS — G5793 Unspecified mononeuropathy of bilateral lower limbs: Secondary | ICD-10-CM

## 2024-04-14 DIAGNOSIS — E1169 Type 2 diabetes mellitus with other specified complication: Secondary | ICD-10-CM

## 2024-04-29 ENCOUNTER — Ambulatory Visit: Admitting: Family Medicine

## 2024-06-11 ENCOUNTER — Ambulatory Visit: Admitting: Family Medicine
